# Patient Record
Sex: Female | Born: 1950 | Race: White | Hispanic: No | Smoking: Never smoker
Health system: Southern US, Community
[De-identification: ages and names within clinical notes are randomized; demographics above are authoritative.]

## PROBLEM LIST (undated history)

## (undated) DIAGNOSIS — E785 Hyperlipidemia, unspecified: Secondary | ICD-10-CM

## (undated) DIAGNOSIS — E669 Obesity, unspecified: Secondary | ICD-10-CM

## (undated) DIAGNOSIS — K8020 Calculus of gallbladder without cholecystitis without obstruction: Secondary | ICD-10-CM

## (undated) DIAGNOSIS — E119 Type 2 diabetes mellitus without complications: Secondary | ICD-10-CM

## (undated) DIAGNOSIS — H409 Unspecified glaucoma: Secondary | ICD-10-CM

## (undated) DIAGNOSIS — M1990 Unspecified osteoarthritis, unspecified site: Secondary | ICD-10-CM

## (undated) DIAGNOSIS — H269 Unspecified cataract: Secondary | ICD-10-CM

## (undated) DIAGNOSIS — R519 Headache, unspecified: Secondary | ICD-10-CM

## (undated) DIAGNOSIS — K219 Gastro-esophageal reflux disease without esophagitis: Secondary | ICD-10-CM

## (undated) DIAGNOSIS — F329 Major depressive disorder, single episode, unspecified: Secondary | ICD-10-CM

## (undated) DIAGNOSIS — E039 Hypothyroidism, unspecified: Secondary | ICD-10-CM

## (undated) DIAGNOSIS — C50919 Malignant neoplasm of unspecified site of unspecified female breast: Secondary | ICD-10-CM

## (undated) DIAGNOSIS — F32A Depression, unspecified: Secondary | ICD-10-CM

## (undated) DIAGNOSIS — E079 Disorder of thyroid, unspecified: Secondary | ICD-10-CM

## (undated) HISTORY — PX: TONSILLECTOMY: SUR1361

## (undated) HISTORY — DX: Gastro-esophageal reflux disease without esophagitis: K21.9

## (undated) HISTORY — PX: EYE SURGERY: SHX253

## (undated) HISTORY — DX: Type 2 diabetes mellitus without complications: E11.9

## (undated) HISTORY — DX: Depression, unspecified: F32.A

## (undated) HISTORY — DX: Unspecified glaucoma: H40.9

## (undated) HISTORY — DX: Major depressive disorder, single episode, unspecified: F32.9

## (undated) HISTORY — DX: Disorder of thyroid, unspecified: E07.9

## (undated) HISTORY — DX: Unspecified cataract: H26.9

---

## 1981-12-28 HISTORY — PX: WISDOM TOOTH EXTRACTION: SHX21

## 1996-12-28 HISTORY — PX: CARDIAC CATHETERIZATION: SHX172

## 2004-10-30 ENCOUNTER — Ambulatory Visit: Payer: Self-pay | Admitting: General Surgery

## 2004-10-30 HISTORY — PX: COLONOSCOPY: SHX174

## 2008-12-28 HISTORY — PX: BREAST BIOPSY: SHX20

## 2009-03-15 ENCOUNTER — Ambulatory Visit: Payer: Self-pay | Admitting: General Surgery

## 2009-03-15 IMAGING — MG MAM DGTL SCREENING MAMMO W/CAD
1 series · 4 of 4 positions shown · non-contrast
Comparison: none

REASON FOR EXAM: SCREENING
COMMENTS:  Submitted by practice: [HOSPITAL] Scheduled by
user:

PROCEDURE:     MAM - MAM DGTL SCREENING MAMMO W/CAD  - [DATE]  [DATE]
RESULT:
COMPARISONS: [DATE] and [DATE].

[R CC · right · 4 of 4 slices shown]
[im 1/4]
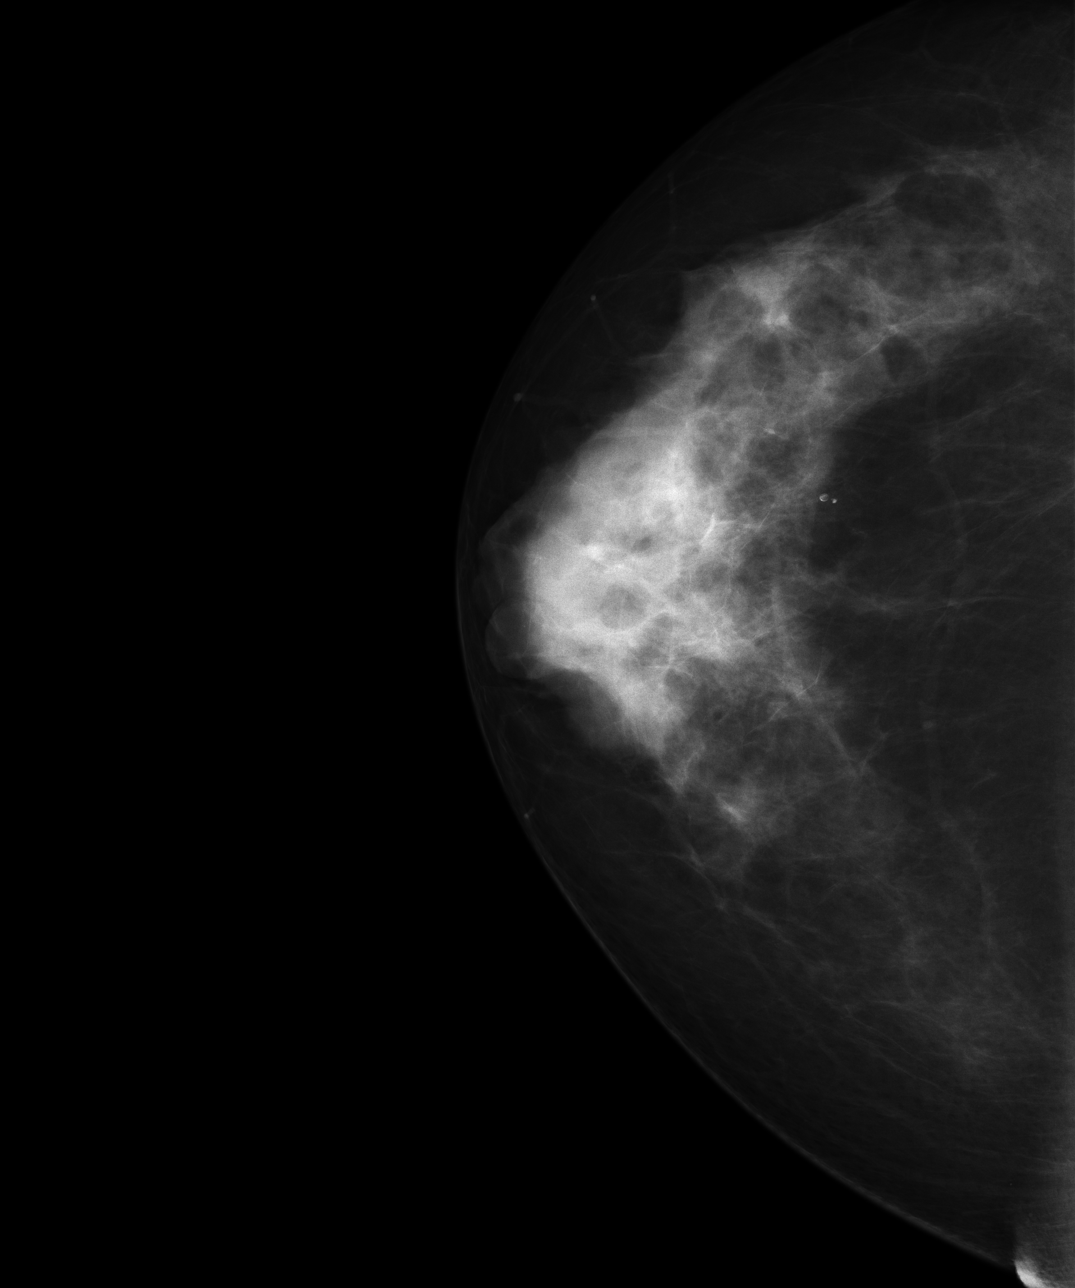
[im 2/4]
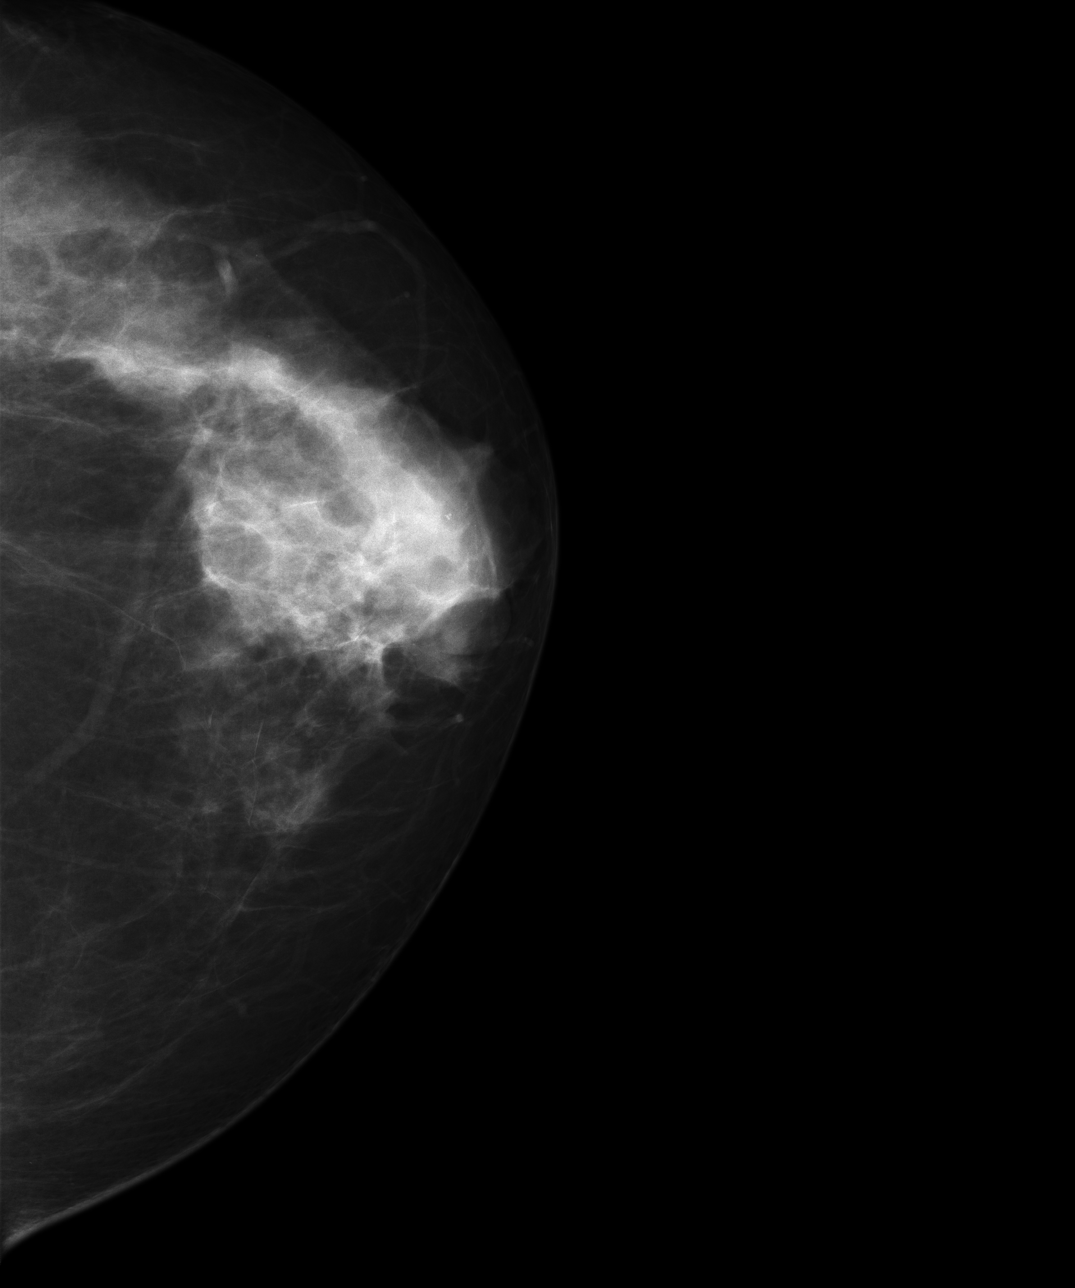
[im 3/4]
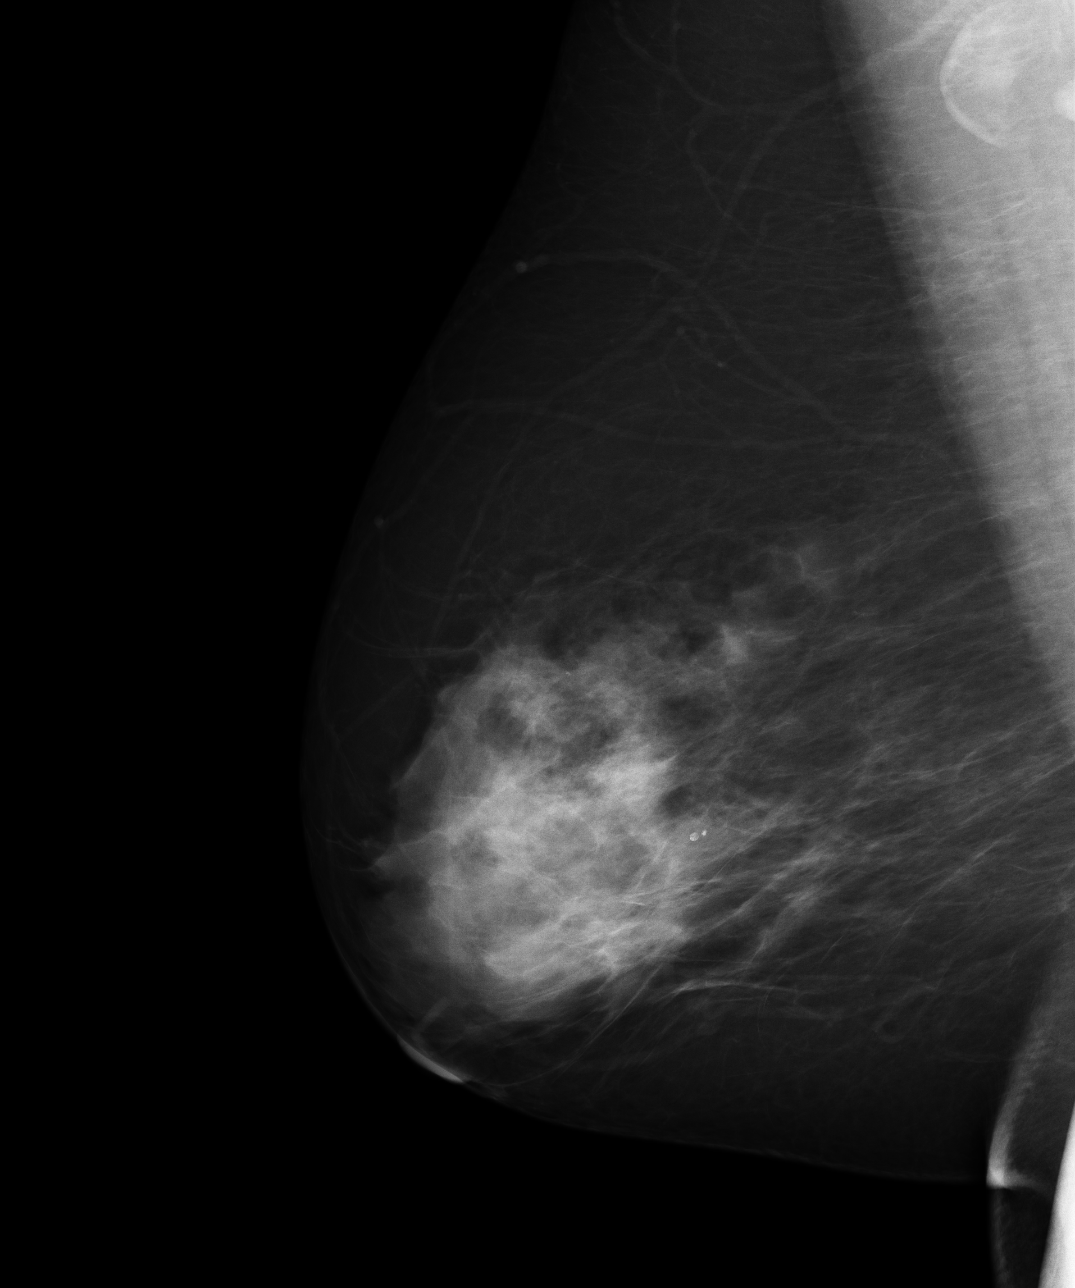
[im 4/4]
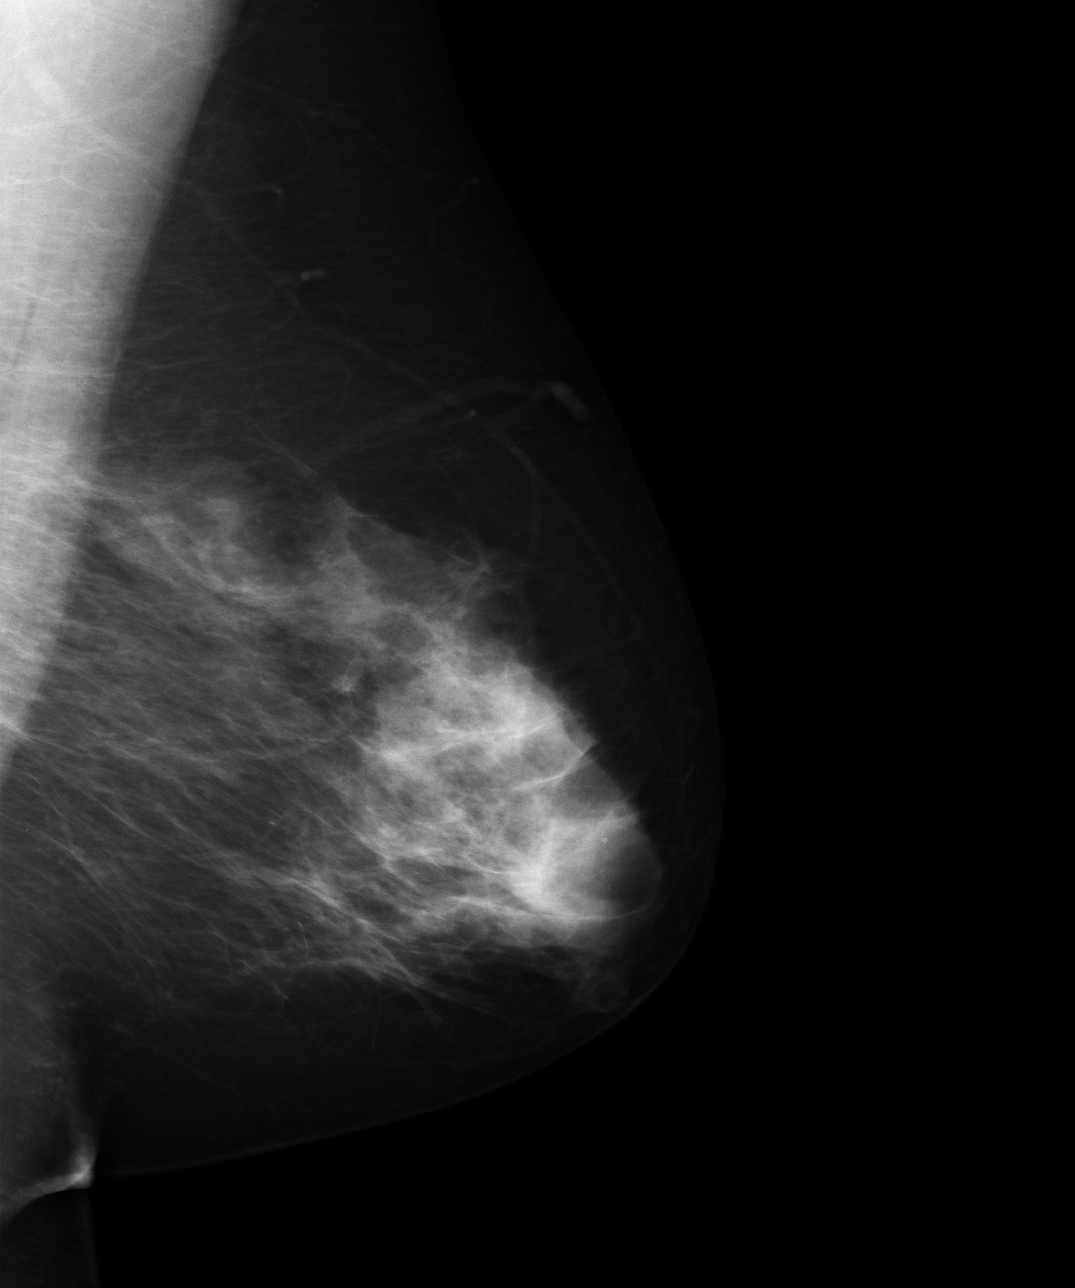

[4 of 4 positions shown; findings below may reference images not displayed]

FINDINGS: Bilateral breasts demonstrate a heterogenous breast density which
may lower the sensitivity of mammography. There is no dominant mass,
architectural distortion, or clusters of suspicious appearing
microcalcifications.
IMPRESSION: 1. Stable, bilateral mammogram.

2. Annual mammographic follow-up is recommended.

BI-RADS: Category 2-Benign Finding

A NEGATIVE MAMMOGRAM REPORT DOES NOT PRECLUDE BIOPSY OR OTHER EVALUATION OF
A CLINICALLY PALPABLE OR OTHERWISE SUSPICIOUS MASS OR LESION. BREAST CANCER
MAY NOT BE DETECTED BY MAMMOGRAPHY IN UP TO 10% OF CASES.

## 2012-05-31 ENCOUNTER — Ambulatory Visit (INDEPENDENT_AMBULATORY_CARE_PROVIDER_SITE_OTHER): Payer: BC Managed Care – PPO | Admitting: Internal Medicine

## 2012-05-31 VITALS — BP 124/83 | HR 66 | Temp 98.8°F | Resp 18 | Ht 61.0 in | Wt 211.0 lb

## 2012-05-31 DIAGNOSIS — R5381 Other malaise: Secondary | ICD-10-CM

## 2012-05-31 DIAGNOSIS — Z6839 Body mass index (BMI) 39.0-39.9, adult: Secondary | ICD-10-CM

## 2012-05-31 DIAGNOSIS — R0989 Other specified symptoms and signs involving the circulatory and respiratory systems: Secondary | ICD-10-CM

## 2012-05-31 DIAGNOSIS — E039 Hypothyroidism, unspecified: Secondary | ICD-10-CM | POA: Insufficient documentation

## 2012-05-31 DIAGNOSIS — R7309 Other abnormal glucose: Secondary | ICD-10-CM

## 2012-05-31 DIAGNOSIS — R739 Hyperglycemia, unspecified: Secondary | ICD-10-CM

## 2012-05-31 DIAGNOSIS — R0609 Other forms of dyspnea: Secondary | ICD-10-CM

## 2012-05-31 DIAGNOSIS — E669 Obesity, unspecified: Secondary | ICD-10-CM | POA: Insufficient documentation

## 2012-05-31 DIAGNOSIS — R635 Abnormal weight gain: Secondary | ICD-10-CM

## 2012-05-31 DIAGNOSIS — R5383 Other fatigue: Secondary | ICD-10-CM

## 2012-05-31 LAB — POCT CBC
Granulocyte percent: 58.3 %G (ref 37–80)
Hemoglobin: 14.7 g/dL (ref 12.2–16.2)
MCH, POC: 29.5 pg (ref 27–31.2)
MCV: 94.6 fL (ref 80–97)
MID (cbc): 0.6 (ref 0–0.9)
MPV: 10.5 fL (ref 0–99.8)
POC MID %: 7 %M (ref 0–12)
Platelet Count, POC: 308 10*3/uL (ref 142–424)
RBC: 4.99 M/uL (ref 4.04–5.48)
WBC: 8.2 10*3/uL (ref 4.6–10.2)

## 2012-05-31 LAB — T4, FREE: Free T4: 0.91 ng/dL (ref 0.80–1.80)

## 2012-05-31 LAB — COMPREHENSIVE METABOLIC PANEL
ALT: 10 U/L (ref 0–35)
Albumin: 4.4 g/dL (ref 3.5–5.2)
Alkaline Phosphatase: 62 U/L (ref 39–117)
Potassium: 4.6 mEq/L (ref 3.5–5.3)
Sodium: 140 mEq/L (ref 135–145)
Total Bilirubin: 0.4 mg/dL (ref 0.3–1.2)
Total Protein: 7 g/dL (ref 6.0–8.3)

## 2012-05-31 LAB — GLUCOSE, POCT (MANUAL RESULT ENTRY): POC Glucose: 62 mg/dl — AB (ref 70–99)

## 2012-05-31 LAB — LIPID PANEL
LDL Cholesterol: 139 mg/dL — ABNORMAL HIGH (ref 0–99)
VLDL: 20 mg/dL (ref 0–40)

## 2012-05-31 LAB — TSH: TSH: 4.085 u[IU]/mL (ref 0.350–4.500)

## 2012-05-31 NOTE — Progress Notes (Signed)
  Subjective:    Patient ID: Valerie Gordon, female    DOB: 06-29-1951, 61 y.o.   MRN: [id number]  HPI Last office visit April 2010 Has been off medications for about a year and has suffered weight gain with Blood sugar erratic, diet erratic, routine all mixed up Feels very depleted after exercise Has DOE Occasionally but no chest pain or diaphoresis/no peripheral edema Off all meds for many months/She is hypothyroid At one point she had full-fledged diabetes mellitus requiring oral medication, but she exercise to the point that she lost enough weight to be off of medication.   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  HENT: Negative for hearing loss.   Eyes: Negative for visual disturbance.  Respiratory: Negative for wheezing.   Cardiovascular: Negative for chest pain, palpitations and leg swelling.  Genitourinary:       Menopause age 48  Neurological: Negative for dizziness, weakness and headaches.  Psychiatric/Behavioral: Negative for behavioral problems and dysphoric mood.       Feels she is under a lot of stress financially owning a half she can afford and also with a recent move back to this area/although she is stressed often she does not have any trouble with sleep       Objective:   Physical Exam Vital signs stable except elevated BMI HEENT clear without thyromegaly Chest clear to auscultation Heart regular without murmurs rubs or gallops Extremities free of peripheral edema Neurological intact       Results for orders placed in visit on 05/31/12  POCT CBC      Component Value Range   WBC 8.2  4.6 - 10.2 (K/uL)   Lymph, poc 2.8  0.6 - 3.4    POC LYMPH PERCENT 34.7  10 - 50 (%L)   MID (cbc) 0.6  0 - 0.9    POC MID % 7.0  0 - 12 (%M)   POC Granulocyte 4.8  2 - 6.9    Granulocyte percent 58.3  37 - 80 (%G)   RBC 4.99  4.04 - 5.48 (M/uL)   Hemoglobin  14.7  12.2 - 16.2 (g/dL)   HCT, POC 40.9  81.1 - 47.9 (%)   MCV 94.6  80 - 97 (fL)   MCH, POC 29.5  27 - 31.2 (pg)   MCHC 31.1 (*) 31.8 - 35.4 (g/dL)   RDW, POC 91.4     Platelet Count, POC 308  142 - 424 (K/uL)   MPV 10.5  0 - 99.8 (fL)  POCT GLYCOSYLATED HEMOGLOBIN (HGB A1C)      Component Value Range   Hemoglobin A1C 5.5    GLUCOSE, POCT (MANUAL RESULT ENTRY)      Component Value Range   POC Glucose 62 (*) 70 - 99 (mg/dl)    Assessment & Plan:  Problem #1 hypothyroidism off of medication Will recheck labs and send prescription to pharmacy Problem #2 abnormal weight gain? Change in routine Problem #3 fatigue with easy fatigability Problem #4 mild dyspnea on exertion Problem #5 history of hyperglycemia  Labs today rule out recurrence of diabetes No evidence of anemia Will check metabolic profile and thyroid studies as well as lipids and Fax to her [phone] Handout given for anti-inflammatory diet with connection to website for their weight loss program at the [facility] [place] division of family medicine

## 2012-06-03 ENCOUNTER — Encounter: Payer: Self-pay | Admitting: Internal Medicine

## 2012-06-09 ENCOUNTER — Telehealth: Payer: Self-pay

## 2012-06-09 NOTE — Telephone Encounter (Signed)
PT SAW DOOLITTLE LAST WEEK AND HE WAS TO FAX HER THE LAB RESULTS AND DECIDE ON HER MEDICATIONS.  SHE HAS NOT HEARD ANYTHING OR SEEN A FAX.  PLEASE CALL.  FAX NUMBER IS [phone]

## 2012-06-10 NOTE — Telephone Encounter (Signed)
I sent her results  LETTER BY MISTAKE> You could prevent that letter and faxed it to her with my condolences-

## 2012-06-10 NOTE — Telephone Encounter (Signed)
Left message of Dr. Netta Corrigan message and faxed labs.

## 2012-06-16 ENCOUNTER — Telehealth: Payer: Self-pay

## 2012-06-16 NOTE — Telephone Encounter (Signed)
PATIENT CALLED TWO WEEKS AGO FOR DOOLITTLE.  HE TOLD HER TO KEEP TAKING HER SYNTHROID AND THAT IT WOULD BE CALLED INTO THE [facility] IN [place] ON [address]  THIS HAS NEVER HAPPENED.  PLEASE CALL

## 2012-06-17 MED ORDER — LEVOTHYROXINE SODIUM 100 MCG PO TABS: 100.0000 ug | ORAL_TABLET | Freq: Every day | ORAL | Status: DC

## 2012-06-17 NOTE — Telephone Encounter (Signed)
Called patient, taking 100mg  q day.

## 2012-06-17 NOTE — Telephone Encounter (Signed)
Done

## 2012-10-26 ENCOUNTER — Ambulatory Visit: Payer: Self-pay | Admitting: Ophthalmology

## 2012-11-30 ENCOUNTER — Ambulatory Visit: Payer: Self-pay | Admitting: Ophthalmology

## 2014-08-20 ENCOUNTER — Ambulatory Visit (INDEPENDENT_AMBULATORY_CARE_PROVIDER_SITE_OTHER): Payer: BC Managed Care – PPO | Admitting: Internal Medicine

## 2014-08-20 VITALS — BP 148/84 | HR 83 | Temp 98.8°F | Resp 16 | Wt 223.8 lb

## 2014-08-20 DIAGNOSIS — R5381 Other malaise: Secondary | ICD-10-CM

## 2014-08-20 DIAGNOSIS — R5383 Other fatigue: Secondary | ICD-10-CM

## 2014-08-20 DIAGNOSIS — R635 Abnormal weight gain: Secondary | ICD-10-CM

## 2014-08-20 DIAGNOSIS — E039 Hypothyroidism, unspecified: Secondary | ICD-10-CM

## 2014-08-20 DIAGNOSIS — B351 Tinea unguium: Secondary | ICD-10-CM

## 2014-08-20 LAB — POCT CBC
GRANULOCYTE PERCENT: 64.6 % (ref 37–80)
HCT, POC: 46.6 % (ref 37.7–47.9)
Hemoglobin: 15.1 g/dL (ref 12.2–16.2)
Lymph, poc: 3.2 (ref 0.6–3.4)
MCH, POC: 30.3 pg (ref 27–31.2)
MCHC: 32.3 g/dL (ref 31.8–35.4)
MCV: 93.7 fL (ref 80–97)
MID (CBC): 0.6 (ref 0–0.9)
MPV: 8.9 fL (ref 0–99.8)
PLATELET COUNT, POC: 275 10*3/uL (ref 142–424)
POC GRANULOCYTE: 6.9 (ref 2–6.9)
POC LYMPH %: 29.5 % (ref 10–50)
POC MID %: 5.9 %M (ref 0–12)
RBC: 4.97 M/uL (ref 4.04–5.48)
RDW, POC: 14 %
WBC: 10.7 10*3/uL — AB (ref 4.6–10.2)

## 2014-08-20 LAB — POCT GLYCOSYLATED HEMOGLOBIN (HGB A1C): Hemoglobin A1C: 5.8

## 2014-08-20 MED ORDER — TERBINAFINE HCL 250 MG PO TABS: 250.0000 mg | ORAL_TABLET | Freq: Every day | ORAL | Status: DC

## 2014-08-20 MED ORDER — BUPROPION HCL ER (XL) 150 MG PO TB24: 150.0000 mg | ORAL_TABLET | Freq: Every day | ORAL | Status: DC

## 2014-08-20 NOTE — Progress Notes (Signed)
Subjective:    Patient ID: Valerie Gordon, female    DOB: Nov 17, 1951, 63 y.o.   MRN: [id number]  This chart was scribed for Tami Lin, MD by Erling Conte, Medical Scribe. This patient was seen in Room 11 and the patient's care was started at 6:30 PM.  Chief Complaint  Patient presents with   Thyroid Problem    Off synthroid for a long time   Weight Gain   Depression    going back on Welbutrin    HPI HPI Comments: Valerie Gordon is a 63 y.o. female with a h/o hypothyroidism and obesity who presents to the Emergency Department complaining of, constant, gradually worsening, weight gain for 7-8 years. Went off Wellbutrin about 7-8 years ago and she thinks that when she started to gradually gain weight again. She also states she is going through some increased depression. States it has not affected her sleep. She is wanting to lose weight. She has cut down her portions and cut back on her carbohydrates and states she has been exercising every day and she is not losing any weight. She would like to go back on Wellbutrin or some other possible medication to help with her depression symptoms. She denies any shortness of breath, chest pain, or swelling in her legs,   She has a second complaint of discoloration in her toenails on her bilateral feet. She denies any rash or pain to the area.  Patient has a long past history [facility]--first husband died with brain cancer under our care. She then developed diabetes which she successfully put into remission with marked weight loss and diet and exercise. She has been stable these last 3 or 4 years It is important that she discontinued her thyroid medicine about one year ago  Patient Active Problem List   Diagnosis Date Noted   BMI 39.0-39.9,adult 05/31/2012   Hypothyroidism 05/31/2012   Past Medical History  Diagnosis Date   Thyroid disease    Past Surgical History  Procedure Laterality Date   Eye surgery Bilateral     cataract  sx   Allergies  Allergen Reactions   Antihistamines, Chlorpheniramine-Type    Ciprocin-Fluocin-Procin [Fluocinolone]    Prior to Admission medications   Medication Sig Start Date End Date Taking? Authorizing Provider  levothyroxine (SYNTHROID, LEVOTHROID) 100 MCG tablet Take 1 tablet (100 mcg total) by mouth daily. 06/17/12 06/17/13  Collene Leyden, PA-C   History   Social History   Marital Status: Single    Spouse Name: N/A    Number of Children: N/A   Years of Education: N/A   Occupational History   Not on file.   Social History Main Topics   Smoking status: Never Smoker    Smokeless tobacco: Not on file   Alcohol Use: Not on file   Drug Use: Not on file   Sexual Activity: Not on file   Other Topics Concern   Not on file   Social History Narrative   No narrative on file    Review of Systems  Constitutional: Positive for unexpected weight change (weight gain).  Respiratory: Negative for shortness of breath.   Cardiovascular: Negative for chest pain and leg swelling.  Musculoskeletal: Negative for arthralgias.  Skin: Positive for color change (in toenails on bilateral feet). Negative for rash.  Psychiatric/Behavioral: Positive for dysphoric mood. Negative for sleep disturbance.  All other systems reviewed and are negative.      Objective:   Physical Exam  Nursing note and  vitals reviewed. Constitutional: She is oriented to person, place, and time. She appears well-developed and well-nourished. No distress.  HENT:  Head: Normocephalic and atraumatic.  Eyes: Conjunctivae and EOM are normal. Pupils are equal, round, and reactive to light.  Neck: Normal range of motion. Neck supple. No tracheal deviation present. No mass and no thyromegaly present.  No goiter or thyroid nodules  Cardiovascular: Normal rate, regular rhythm and normal heart sounds.   No murmur heard. No peripheral edema. Full peripheral pulses  Pulmonary/Chest: Effort normal and breath  sounds normal. No respiratory distress.  Abdominal: Soft. Bowel sounds are normal. She exhibits no mass. There is no tenderness.  Musculoskeletal: Normal range of motion.  Neurological: She is alert and oriented to person, place, and time.  Skin: Skin is warm and dry.  Both great toes are thickened with subungual densities. Several smaller toenails have leuconychia w/o thickening.  Psychiatric: She has a normal mood and affect. Her behavior is normal.    Filed Vitals:   08/20/14 1815  BP: 148/84  Pulse: 83  Temp: 98.8 F (37.1 C)  TempSrc: Oral  Resp: 16  Weight: 223 lb 12.8 oz (101.515 kg)  SpO2: 95%          Assessment & Plan:

## 2014-08-21 ENCOUNTER — Encounter: Payer: Self-pay | Admitting: Internal Medicine

## 2014-08-21 LAB — COMPREHENSIVE METABOLIC PANEL
ALT: 9 U/L (ref 0–35)
AST: 24 U/L (ref 0–37)
Albumin: 4.5 g/dL (ref 3.5–5.2)
Alkaline Phosphatase: 75 U/L (ref 39–117)
BILIRUBIN TOTAL: 0.3 mg/dL (ref 0.2–1.2)
BUN: 14 mg/dL (ref 6–23)
CALCIUM: 9.9 mg/dL (ref 8.4–10.5)
CO2: 27 mEq/L (ref 19–32)
CREATININE: 1.05 mg/dL (ref 0.50–1.10)
Chloride: 104 mEq/L (ref 96–112)
Glucose, Bld: 83 mg/dL (ref 70–99)
Potassium: 4.1 mEq/L (ref 3.5–5.3)
Sodium: 138 mEq/L (ref 135–145)
Total Protein: 7.2 g/dL (ref 6.0–8.3)

## 2014-08-21 LAB — TSH: TSH: 4.517 u[IU]/mL — ABNORMAL HIGH (ref 0.350–4.500)

## 2014-08-21 LAB — T4, FREE: Free T4: 1 ng/dL (ref 0.80–1.80)

## 2014-09-14 ENCOUNTER — Ambulatory Visit (INDEPENDENT_AMBULATORY_CARE_PROVIDER_SITE_OTHER): Payer: BC Managed Care – PPO | Admitting: Internal Medicine

## 2014-09-14 VITALS — BP 120/80 | HR 85 | Temp 98.5°F | Resp 16 | Ht 61.0 in | Wt 223.0 lb

## 2014-09-14 DIAGNOSIS — R05 Cough: Secondary | ICD-10-CM

## 2014-09-14 DIAGNOSIS — J019 Acute sinusitis, unspecified: Secondary | ICD-10-CM

## 2014-09-14 DIAGNOSIS — R059 Cough, unspecified: Secondary | ICD-10-CM

## 2014-09-14 MED ORDER — AMOXICILLIN 875 MG PO TABS: 875.0000 mg | ORAL_TABLET | Freq: Two times a day (BID) | ORAL | Status: DC

## 2014-09-14 MED ORDER — LEVOTHYROXINE SODIUM 100 MCG PO TABS: 100.0000 ug | ORAL_TABLET | Freq: Every day | ORAL | Status: DC

## 2014-09-14 MED ORDER — BUPROPION HCL ER (XL) 150 MG PO TB24: 150.0000 mg | ORAL_TABLET | Freq: Every day | ORAL | Status: DC

## 2014-09-14 MED ORDER — HYDROCODONE-HOMATROPINE 5-1.5 MG/5ML PO SYRP: 5.0000 mL | ORAL_SOLUTION | Freq: Four times a day (QID) | ORAL | Status: DC | PRN

## 2014-09-14 NOTE — Progress Notes (Signed)
Subjective:    Patient ID: Valerie Gordon, female    DOB: 02/14/1951, 63 y.o.   MRN: [id number] This chart was scribed for [place]. Laney Pastor, MD by Steva Colder, ED Scribe. The patient was seen in room 13 at 4:01 PM.   Chief Complaint  Patient presents with  . Sore Throat    x 2 days  . Nasal Congestion    x 2 days  . Cough    x 2 days    HPI Valerie Gordon is a 63 y.o. female with a medical hx of hypothyroidism who presents today complaining of sore throat, nasal congestion, and cough onset 2 days. She states that she went to the beach and came back sick. She states that she shared a condo with 8 other females and none of them were sick. She states that it started in her nose and she does not want it to get into her chest. She states that she is having a productive cough. She denies any other associated symptoms.    Patient Active Problem List   Diagnosis Date Noted  . BMI 40.0-44.9, adult 05/31/2012  . Hypothyroidism 05/31/2012   Past Medical History  Diagnosis Date  . Thyroid disease    Past Surgical History  Procedure Laterality Date  . Eye surgery Bilateral     cataract sx   Allergies  Allergen Reactions  . Antihistamines, Chlorpheniramine-Type   . Ciprocin-Fluocin-Procin [Fluocinolone]    Prior to Admission medications   Medication Sig Start Date End Date Taking? Authorizing Provider  buPROPion (WELLBUTRIN XL) 150 MG 24 hr tablet Take 1 tablet (150 mg total) by mouth daily. After 1 week may increase to 2 tabs a day 08/20/14   Leandrew Koyanagi, MD  levothyroxine (SYNTHROID, LEVOTHROID) 100 MCG tablet Take 1 tablet (100 mcg total) by mouth daily. 06/17/12 06/17/13  Heather Elnora Morrison, PA-C  terbinafine (LAMISIL) 250 MG tablet Take 1 tablet (250 mg total) by mouth daily. 08/20/14   Leandrew Koyanagi, MD       Review of Systems  Constitutional: Negative for fever and chills.  Cardiovascular: Negative for chest pain.  Gastrointestinal: Negative for vomiting.    Musculoskeletal: Negative for neck pain.  Neurological: Negative for light-headedness.       Objective:   Physical Exam  Nursing note and vitals reviewed. Constitutional: She is oriented to person, place, and time. She appears well-developed and well-nourished. No distress.  HENT:  Head: Normocephalic and atraumatic.  Right Ear: External ear normal.  Left Ear: External ear normal.  Mouth/Throat: Oropharynx is clear and moist.  Purulent discharge in nares.  Eyes: EOM are normal.  Neck: Neck supple.  Cardiovascular: Normal rate, regular rhythm and normal heart sounds.   Pulmonary/Chest: Effort normal and breath sounds normal. No respiratory distress.  Musculoskeletal: Normal range of motion.  Lymphadenopathy:    She has no cervical adenopathy.  Neurological: She is alert and oriented to person, place, and time.  Skin: Skin is warm and dry.  Psychiatric: She has a normal mood and affect. Her behavior is normal.         BP 120/80  Pulse 85  Temp(Src) 98.5 F (36.9 C) (Oral)  Resp 16  Ht 5\' 1"  (1.549 m)  Wt 223 lb (101.152 kg)  BMI 42.16 kg/m2  SpO2 94%  Assessment & Plan:  I personally performed the services described in this documentation, which was scribed in my presence. The recorded information has been reviewed and is accurate.  Acute sinusitis, unspecified  Cough  Meds ordered this encounter  Medications  . buPROPion (WELLBUTRIN XL) 150 MG 24 hr tablet    Sig: Take 1 tablet (150 mg total) by mouth daily. After 1 week may increase to 2 tabs a day    Dispense:  180 tablet    Refill:  3    May refill with 300mg  XL # 90 1 Daily if preferred  . levothyroxine (SYNTHROID, LEVOTHROID) 100 MCG tablet    Sig: Take 1 tablet (100 mcg total) by mouth daily.    Dispense:  90 tablet    Refill:  3  . HYDROcodone-homatropine (HYCODAN) 5-1.5 MG/5ML syrup    Sig: Take 5 mLs by mouth every 6 (six) hours as needed.    Dispense:  120 mL    Refill:  0  . amoxicillin  (AMOXIL) 875 MG tablet    Sig: Take 1 tablet (875 mg total) by mouth 2 (two) times daily.    Dispense:  20 tablet    Refill:  0

## 2014-12-24 ENCOUNTER — Telehealth: Payer: Self-pay

## 2014-12-24 DIAGNOSIS — Z0000 Encounter for general adult medical examination without abnormal findings: Secondary | ICD-10-CM

## 2014-12-24 DIAGNOSIS — R532 Functional quadriplegia: Secondary | ICD-10-CM

## 2014-12-24 NOTE — Telephone Encounter (Signed)
Pt is scheduled for a CPE with dr Laney Pastor on 01/16/15, but states in the past the dr has written an order to get her bloodwork done in advance. Pt is requesting to make the same arrangements for her labs, so results can be discussed at the time of the appointment

## 2014-12-25 NOTE — Telephone Encounter (Signed)
Lab orders have been placed. Pt advised to come in for lab only.

## 2014-12-25 NOTE — Telephone Encounter (Signed)
Please advise which labs to place orders for for future.  Will call to advise pt.

## 2014-12-25 NOTE — Telephone Encounter (Signed)
Cbc lipids cmet A1C

## 2015-01-09 ENCOUNTER — Other Ambulatory Visit (INDEPENDENT_AMBULATORY_CARE_PROVIDER_SITE_OTHER): Payer: 59 | Admitting: Radiology

## 2015-01-09 DIAGNOSIS — Z0000 Encounter for general adult medical examination without abnormal findings: Secondary | ICD-10-CM

## 2015-01-09 LAB — LIPID PANEL
CHOL/HDL RATIO: 3.8 ratio
Cholesterol: 201 mg/dL — ABNORMAL HIGH (ref 0–200)
HDL: 53 mg/dL (ref >39)
LDL CALC: 118 mg/dL — AB (ref 0–99)
Triglycerides: 151 mg/dL — ABNORMAL HIGH (ref <150)
VLDL: 30 mg/dL (ref 0–40)

## 2015-01-09 LAB — COMPLETE METABOLIC PANEL WITH GFR
ALBUMIN: 3.9 g/dL (ref 3.5–5.2)
ALK PHOS: 74 U/L (ref 39–117)
ALT: 9 U/L (ref 0–35)
AST: 21 U/L (ref 0–37)
BILIRUBIN TOTAL: 0.4 mg/dL (ref 0.2–1.2)
BUN: 13 mg/dL (ref 6–23)
CO2: 26 mEq/L (ref 19–32)
CREATININE: 0.99 mg/dL (ref 0.50–1.10)
Calcium: 9.8 mg/dL (ref 8.4–10.5)
Chloride: 104 mEq/L (ref 96–112)
GFR, EST AFRICAN AMERICAN: 71 mL/min
GFR, Est Non African American: 62 mL/min
GLUCOSE: 178 mg/dL — AB (ref 70–99)
Potassium: 4.8 mEq/L (ref 3.5–5.3)
SODIUM: 136 meq/L (ref 135–145)
TOTAL PROTEIN: 6.8 g/dL (ref 6.0–8.3)

## 2015-01-09 LAB — CBC
HCT: 44 % (ref 36.0–46.0)
Hemoglobin: 14.8 g/dL (ref 12.0–15.0)
MCH: 31 pg (ref 26.0–34.0)
MCHC: 33.6 g/dL (ref 30.0–36.0)
MCV: 92.1 fL (ref 78.0–100.0)
MPV: 10.9 fL (ref 8.6–12.4)
PLATELETS: 266 10*3/uL (ref 150–400)
RBC: 4.78 MIL/uL (ref 3.87–5.11)
RDW: 13.1 % (ref 11.5–15.5)
WBC: 7.7 10*3/uL (ref 4.0–10.5)

## 2015-01-09 LAB — POCT GLYCOSYLATED HEMOGLOBIN (HGB A1C): Hemoglobin A1C: 6.9

## 2015-01-09 NOTE — Progress Notes (Signed)
PT HERE FOR LABS ONLY

## 2015-01-16 ENCOUNTER — Ambulatory Visit (INDEPENDENT_AMBULATORY_CARE_PROVIDER_SITE_OTHER): Payer: 59

## 2015-01-16 ENCOUNTER — Ambulatory Visit (INDEPENDENT_AMBULATORY_CARE_PROVIDER_SITE_OTHER): Payer: 59 | Admitting: Internal Medicine

## 2015-01-16 ENCOUNTER — Encounter: Payer: Self-pay | Admitting: Internal Medicine

## 2015-01-16 VITALS — BP 153/103 | HR 83 | Temp 98.2°F | Resp 16 | Ht 60.5 in | Wt 221.0 lb

## 2015-01-16 DIAGNOSIS — H9190 Unspecified hearing loss, unspecified ear: Secondary | ICD-10-CM

## 2015-01-16 DIAGNOSIS — E039 Hypothyroidism, unspecified: Secondary | ICD-10-CM | POA: Diagnosis not present

## 2015-01-16 DIAGNOSIS — R0609 Other forms of dyspnea: Secondary | ICD-10-CM

## 2015-01-16 DIAGNOSIS — R030 Elevated blood-pressure reading, without diagnosis of hypertension: Secondary | ICD-10-CM | POA: Diagnosis not present

## 2015-01-16 DIAGNOSIS — IMO0001 Reserved for inherently not codable concepts without codable children: Secondary | ICD-10-CM

## 2015-01-16 DIAGNOSIS — Z23 Encounter for immunization: Secondary | ICD-10-CM | POA: Diagnosis not present

## 2015-01-16 DIAGNOSIS — L989 Disorder of the skin and subcutaneous tissue, unspecified: Secondary | ICD-10-CM

## 2015-01-16 DIAGNOSIS — K219 Gastro-esophageal reflux disease without esophagitis: Secondary | ICD-10-CM | POA: Diagnosis not present

## 2015-01-16 DIAGNOSIS — F329 Major depressive disorder, single episode, unspecified: Secondary | ICD-10-CM | POA: Diagnosis not present

## 2015-01-16 DIAGNOSIS — F32A Depression, unspecified: Secondary | ICD-10-CM

## 2015-01-16 DIAGNOSIS — Z8639 Personal history of other endocrine, nutritional and metabolic disease: Secondary | ICD-10-CM | POA: Diagnosis not present

## 2015-01-16 DIAGNOSIS — Z0000 Encounter for general adult medical examination without abnormal findings: Secondary | ICD-10-CM

## 2015-01-16 DIAGNOSIS — G4710 Hypersomnia, unspecified: Secondary | ICD-10-CM

## 2015-01-16 DIAGNOSIS — R0683 Snoring: Secondary | ICD-10-CM | POA: Diagnosis not present

## 2015-01-16 DIAGNOSIS — R5383 Other fatigue: Secondary | ICD-10-CM | POA: Diagnosis not present

## 2015-01-16 DIAGNOSIS — E785 Hyperlipidemia, unspecified: Secondary | ICD-10-CM

## 2015-01-16 LAB — POCT URINALYSIS DIPSTICK
BILIRUBIN UA: NEGATIVE
Glucose, UA: NEGATIVE
Ketones, UA: NEGATIVE
Leukocytes, UA: NEGATIVE
NITRITE UA: NEGATIVE
Protein, UA: NEGATIVE
Spec Grav, UA: <=1.005
Urobilinogen, UA: 0.2
pH, UA: 5.5

## 2015-01-16 IMAGING — CR DG CHEST 2V
2 series · 2 of 2 positions shown · non-contrast
Comparison: None.

CLINICAL DATA: Dyspnea on exertion.  Annual physical.

EXAM:
CHEST  2 VIEW

[lateral]
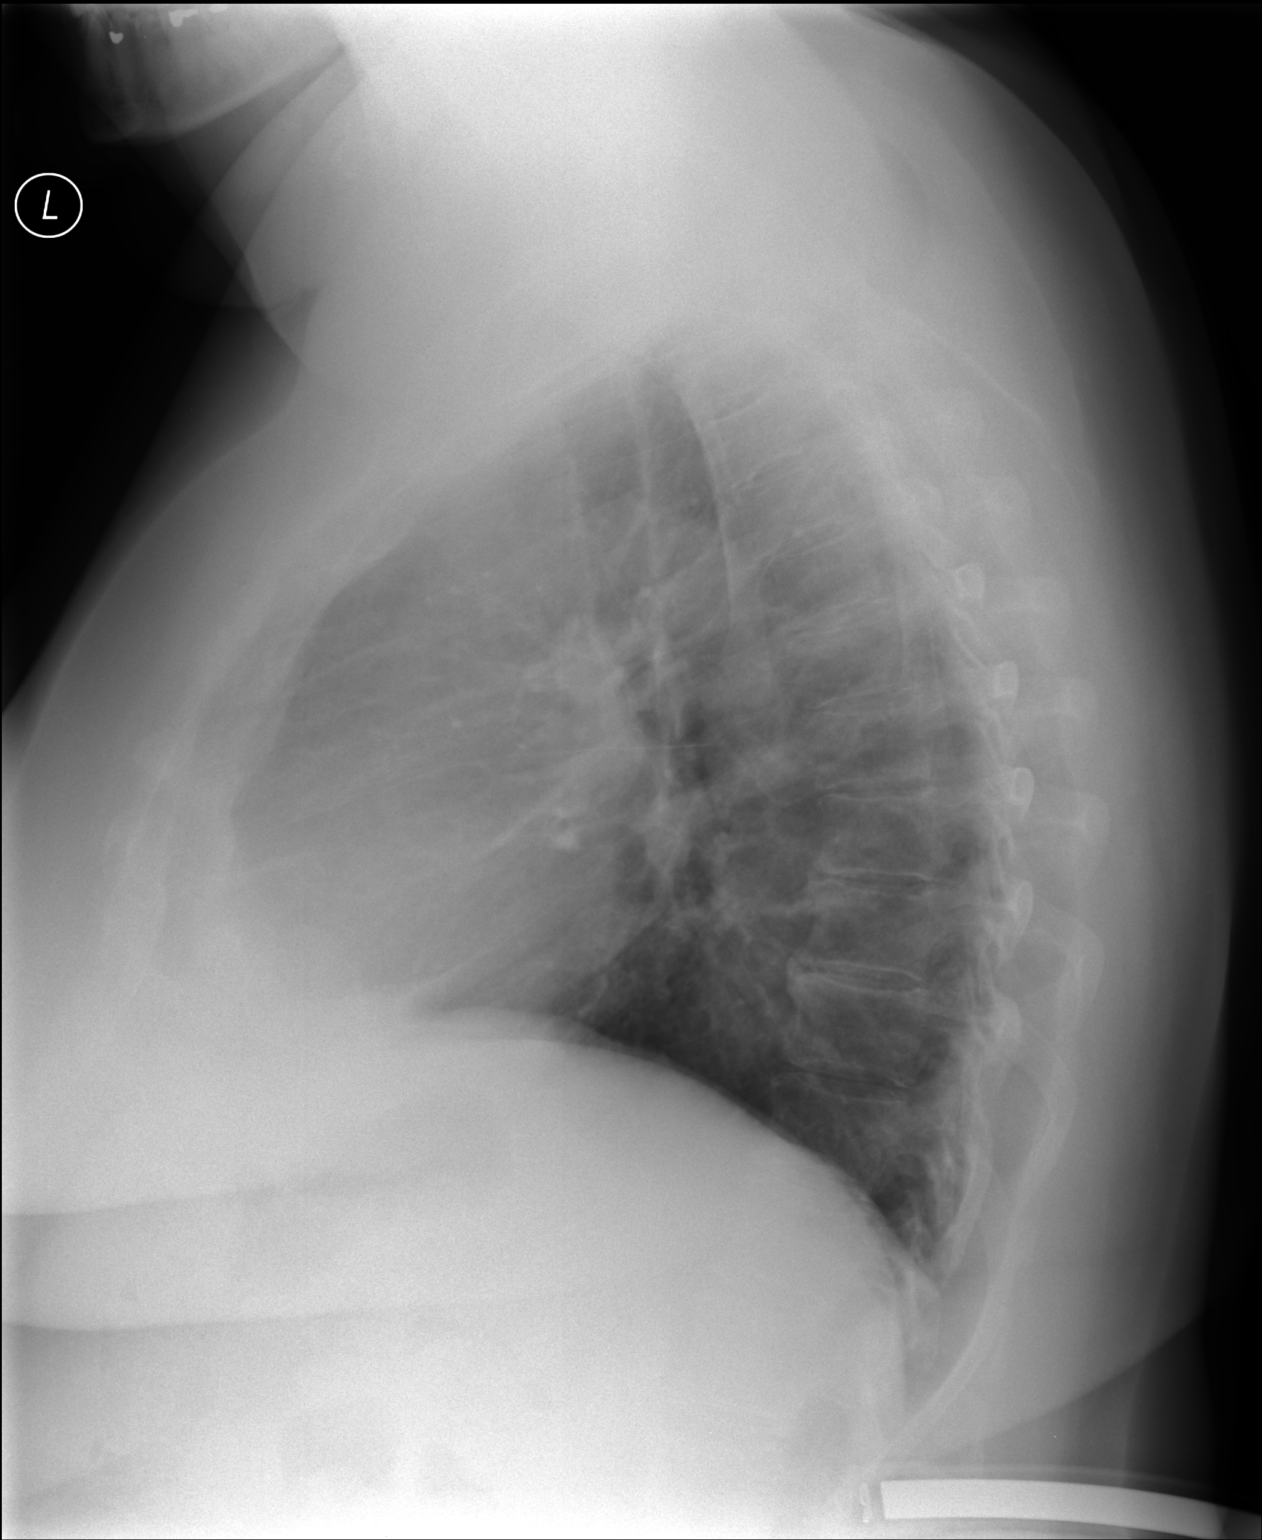

[PA]
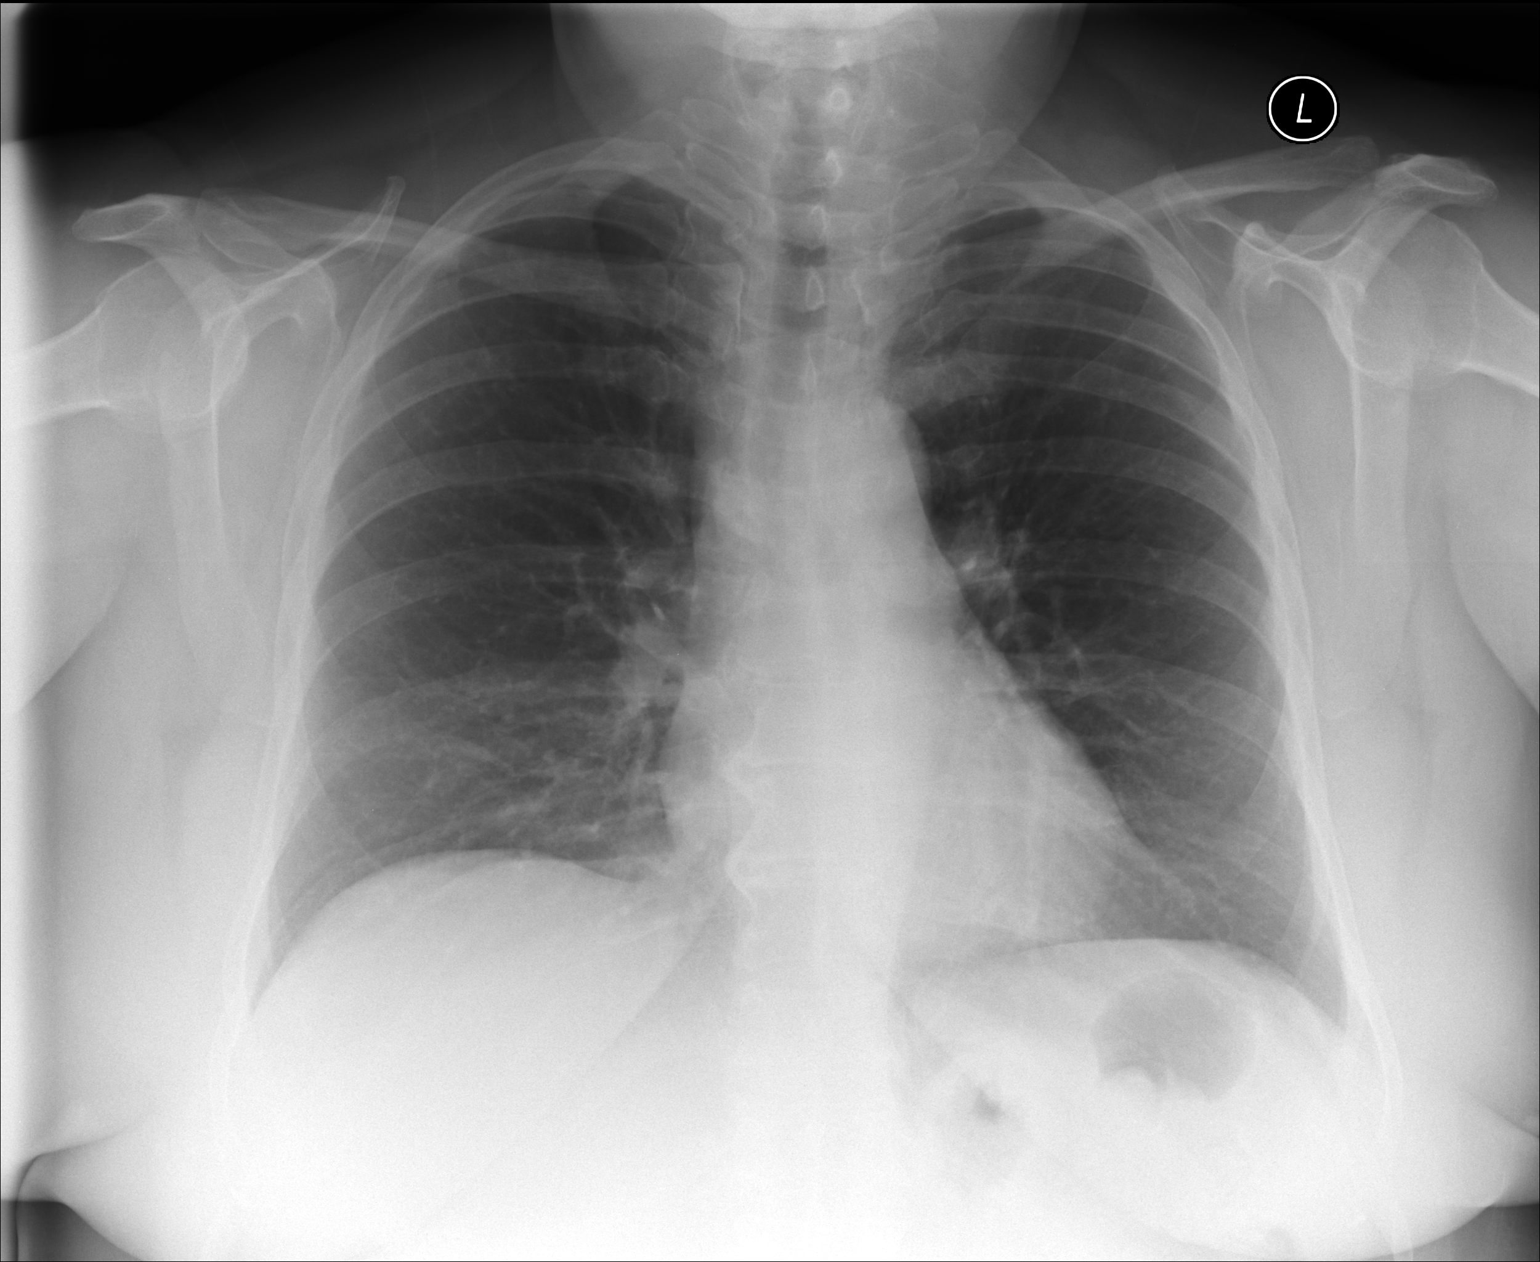

[2 of 2 positions shown; findings below may reference images not displayed]

FINDINGS: Cardiac silhouette normal in size. Normal mediastinal and hilar
contours. Clear lungs. No pleural effusion or pneumothorax. Bony
thorax is intact.
IMPRESSION: No active cardiopulmonary disease.

## 2015-01-16 MED ORDER — FLUOCINONIDE 0.05 % EX CREA: 1.0000 "application " | TOPICAL_CREAM | Freq: Two times a day (BID) | CUTANEOUS | Status: DC

## 2015-01-16 MED ORDER — ESCITALOPRAM OXALATE 10 MG PO TABS: 10.0000 mg | ORAL_TABLET | Freq: Every day | ORAL | Status: DC

## 2015-01-16 MED ORDER — ZOSTER VACCINE LIVE 19400 UNT/0.65ML ~~LOC~~ SOLR: 0.6500 mL | Freq: Once | SUBCUTANEOUS | Status: DC

## 2015-01-16 NOTE — Progress Notes (Signed)
Subjective:    Patient ID: Valerie Gordon, female    DOB: 02/01/1951, 64 y.o.   MRN: [id number] Here for annual checkup and multiple problems Cough Associated symptoms include myalgias, postnasal drip and shortness of breath.  Shortness of Breath Associated symptoms include abdominal pain.  Abdominal Pain Associated symptoms include myalgias.  New lesion arm  Patient Active Problem List   Diagnosis Date Noted  . GERD (gastroesophageal reflux disease)----not active in recent years  01/16/2015  . Elevated BP--- was diagnosed with hypertension at one point 2004/04/02 but lost a lot of weight and her blood pressure elevation resolved 2006///was diagnosed with diabetes Apr 02, 2000 at a weight of 250 pounds //in 2005/04/02 she lost to 175 pounds at her need for diabetes treatment resolved --at one point she needed to be treated at [facility] due to difficulty controlling her sugars ///she has remained off medicines ever since  01/16/2015  . Depression--- her problems surfaced before 04/03/1999 as her first husband died from a brain tumor --- at one point she was treated by Dr. Clovis Pu--- after losing weight she reconnected with her high school boyfriend and that relationship is still going although they have reached somewhat of an impasse because she would like to be married again and he does not see the need. This creates significant stress for her. She currently lacks motivation for controlling her diet and for doing her usual exercise regimen and has begun to gain weight again(over last 2 years). She expresses an hedonism. She is withdrawing from activities. She feels significant fatigue all the time She does have a history of occasional snoring but has no observed apnea. She wakes feeling non-restored. There is some difficulty driving along way with feeling sleepy. She can usually read without falling asleep but will fall asleep as soon as she sits down to watch a movie at home at night.  01/16/2015  . Hyperlipidemia LDL goal  <100--- recently [id number] off meds 01/16/2015  . BMI 40.0-44.9, adult 05/31/2012  . Hypothyroidism---tsh sl incr w/ nl t4 8/15 05/31/2012  Meds include Synthroid 100    Review of Systems  Constitutional: Positive for fatigue.  HENT: Positive for hearing loss, postnasal drip, sinus pressure and tinnitus.   Eyes: Positive for visual disturbance.  Respiratory: Positive for cough and shortness of breath.   Gastrointestinal: Positive for abdominal pain.  Musculoskeletal: Positive for myalgias.  Psychiatric/Behavioral: Positive for dysphoric mood and agitation.   her shortness of breath is with regular activity over the last 3-4 months, and does not include diaphoresis chest pain or palpitations at the same time. She is unable to do what she used to do in terms of walking or working out. Her cough is more recent than the shortness of breath. Like a tickle cough, nonproductive, without clear provocation. She does have allergic rhinitis with frequent postnasal drip and a history of GERD which has been inactive lately. She has noticed an association with restarting Wellbutrin and having this cough. When she discontinued Wellbutrin last week the cough went away. Her hearing loss is described as being unable to follow conversations in a crowded room. Her abdominal pain is very occasional and often associated with constipation although this is not a frequent problem. Along with her fatigue she notices some generalized muscle tenderness. Skin-over the past year she has had a lesion on the left forearm that has slowly enlarged without symptoms of itching or pain. There has been no scabbing or bleeding.    Objective:   Physical  Exam  Constitutional: She is oriented to person, place, and time. She appears well-developed and well-nourished. No distress.  Obviously overweight  HENT:  Head: Normocephalic.  Right Ear: External ear normal.  Left Ear: External ear normal.  Mouth/Throat: Oropharynx is clear and  moist.  Turbinates boggy with clear rhinorrhea  Eyes: Conjunctivae and EOM are normal. Pupils are equal, round, and reactive to light.  Neck: Normal range of motion. Neck supple. No thyromegaly present.  Cardiovascular: Normal rate, regular rhythm, normal heart sounds and intact distal pulses.   No murmur heard. Pulmonary/Chest: Effort normal and breath sounds normal. She has no wheezes.  Abdominal: Soft. Bowel sounds are normal. She exhibits no distension and no mass. There is no tenderness. There is no rebound.  Musculoskeletal: Normal range of motion. She exhibits no edema or tenderness.  Lymphadenopathy:    She has no cervical adenopathy.  Neurological: She is alert and oriented to person, place, and time. She has normal reflexes. No cranial nerve deficit.  Skin: Skin is warm and dry.  There is a 0.8 cm flesh-colored papule that is scaly with a defined border on the left forearm  Psychiatric: Her behavior is normal. Judgment and thought content normal.  She has somewhat sad demeanor  Nursing note and vitals reviewed.  BP 153/103 mmHg  Pulse 83  Temp(Src) 98.2 F (36.8 C)  Resp 16  Ht 5' 0.5" (1.537 m)  Wt 221 lb (100.245 kg)  BMI 42.43 kg/m2  SpO2 98% Wt Readings from Last 3 Encounters:  01/16/15 221 lb (100.245 kg)  09/14/14 223 lb (101.152 kg)  08/20/14 223 lb 12.8 oz (101.515 kg)  o6/04/13 211 lb  [facility] reading (PRIMARY) by  Dr. Laney Pastor chest x-ray is clear although there is mild appearance of increased markings in the right lower lung fields on the PA///felt to be normal by the radiologist   Hemoglobin A1c 6.9 EKG within normal limits Audiometry shows no hearing loss    Assessment & Plan:  Annual physical exam - Plan: POCT urinalysis dipstick, Ambulatory referral to Gynecology, Ambulatory referral to Ophthalmology, TSH, T4, free, EKG 12-Lead, DG Chest 2 View,  Need for immunization against influenza - Plan: Flu Vaccine QUAD 36+ mos IM (Fluarix)  Need for  shingles vaccine - Plan: zoster vaccine live, PF, (ZOSTAVAX) 13086 UNT/0.65ML injection  Hypothyroidism, unspecified hypothyroidism type - Plan: TSH, T4, free  Other fatigue  Hearing loss, unspecified laterality--- she take antihistamines for 2 months to see if this clears since her audiometry was normal  DOE (dyspnea on exertion)-? Related to fatigue and deconditioning//she has had a cardiac catheterization in 2000 which was normal/done after a presentation in the emergency room with chest pain///if we cannot find an etiology for this then she will need a Cardiolite  History of diabetes mellitus -we discussed restarting metformin but she would prefer to make a sincere attempt at weight loss once again before restarting medicines  Hypersomnolence--- sleep study needed given her obesity, snoring, fatigue, depression, elevated blood pressure, shallow pharynx  Gastroesophageal reflux disease without esophagitis--no meds for now  Elevated BP--- start home blood pressure readings  Depression--start Lexapro//refer to Dr. Oneida Arenas at [facility] for counseling with regard to her current relationship  Hyperlipidemia LDL goal <100----slightly elevated at 118  Skin lesion of left arm--- Lidex for one month and if not resolved will need a biopsy to rule out basal cell  Meds ordered this encounter  Medications  . zoster vaccine live, PF, (ZOSTAVAX) 57846 UNT/0.65ML injection  Sig: Inject 19,400 Units into the skin once.    Dispense:  1 each    Refill:  0  . fluocinonide cream (LIDEX) 0.05 %    Sig: Apply 1 application topically 2 (two) times daily.    Dispense:  30 g    Refill:  0  . escitalopram (LEXAPRO) 10 MG tablet    Sig: Take 1 tablet (10 mg total) by mouth daily.    Dispense:  30 tablet    Refill:  5   Her new insurance requires [place] to refer her to her original ophthalmologist and gynecologist to continue care  Results for orders placed or performed in  visit on 01/16/15  TSH  Result Value Ref Range   TSH 3.718 0.350 - 4.500 uIU/mL  T4, free  Result Value Ref Range   Free T4 0.96 0.80 - 1.80 ng/dL  POCT urinalysis dipstick  Result Value Ref Range   Color, UA yellow    Clarity, UA clear    Glucose, UA neg    Bilirubin, UA neg    Ketones, UA neg    Spec Grav, UA <=1.005    Blood, UA trace-intact    pH, UA 5.5    Protein, UA neg    Urobilinogen, UA 0.2    Nitrite, UA neg    Leukocytes, UA Negative   note labs from 01/09/15= normal except LDL 118, glucose 178, and hemoglobin A1c 6.9 up from 5.9 on 08/20/2014

## 2015-01-17 LAB — TSH: TSH: 3.718 u[IU]/mL (ref 0.350–4.500)

## 2015-01-17 LAB — T4, FREE: FREE T4: 0.96 ng/dL (ref 0.80–1.80)

## 2015-01-18 ENCOUNTER — Encounter: Payer: Self-pay | Admitting: Internal Medicine

## 2015-01-23 ENCOUNTER — Other Ambulatory Visit: Payer: Self-pay | Admitting: Internal Medicine

## 2015-01-23 ENCOUNTER — Encounter: Payer: Self-pay | Admitting: Internal Medicine

## 2015-01-23 MED ORDER — LEVOTHYROXINE SODIUM 100 MCG PO TABS: 100.0000 ug | ORAL_TABLET | Freq: Every day | ORAL | Status: DC

## 2015-01-29 LAB — HM MAMMOGRAPHY

## 2015-02-25 LAB — HM DIABETES EYE EXAM

## 2015-02-26 ENCOUNTER — Encounter: Payer: Self-pay | Admitting: Internal Medicine

## 2015-02-27 ENCOUNTER — Encounter: Payer: Self-pay | Admitting: Internal Medicine

## 2015-06-07 ENCOUNTER — Telehealth: Payer: Self-pay | Admitting: *Deleted

## 2015-06-07 NOTE — Telephone Encounter (Signed)
Phoned & [facility] for Tonya in med rec for patient's ob/gyn (also added him to care team), requesting 01/2015 mammo results.

## 2015-06-11 NOTE — Telephone Encounter (Signed)
Received mammo results for mammo completed 01/29/2015 - no significant mammographic findings.  Updated health maintenance & abstracted.

## 2015-06-24 ENCOUNTER — Encounter: Payer: Self-pay | Admitting: *Deleted

## 2015-11-25 ENCOUNTER — Encounter: Payer: Self-pay | Admitting: Internal Medicine

## 2016-02-07 ENCOUNTER — Encounter: Payer: Self-pay | Admitting: *Deleted

## 2016-02-13 ENCOUNTER — Encounter: Payer: Self-pay | Admitting: General Surgery

## 2016-02-17 ENCOUNTER — Encounter: Payer: Self-pay | Admitting: General Surgery

## 2016-02-17 ENCOUNTER — Ambulatory Visit (INDEPENDENT_AMBULATORY_CARE_PROVIDER_SITE_OTHER): Payer: BLUE CROSS/BLUE SHIELD | Admitting: General Surgery

## 2016-02-17 VITALS — BP 130/74 | HR 76 | Resp 12 | Ht 64.0 in | Wt 214.0 lb

## 2016-02-17 DIAGNOSIS — Z1211 Encounter for screening for malignant neoplasm of colon: Secondary | ICD-10-CM | POA: Diagnosis not present

## 2016-02-17 MED ORDER — POLYETHYLENE GLYCOL 3350 17 GM/SCOOP PO POWD: ORAL | Status: DC

## 2016-02-17 NOTE — Patient Instructions (Addendum)
Colonoscopy A colonoscopy is an exam to look at the entire large intestine (colon). This exam can help find problems such as tumors, polyps, inflammation, and areas of bleeding. The exam takes about 1 hour.  LET [facility] CARE PROVIDER KNOW ABOUT:   Any allergies you have.  All medicines you are taking, including vitamins, herbs, eye drops, creams, and over-the-counter medicines.  Previous problems you or members of your family have had with the use of anesthetics.  Any blood disorders you have.  Previous surgeries you have had.  Medical conditions you have. RISKS AND COMPLICATIONS  Generally, this is a safe procedure. However, as with any procedure, complications can occur. Possible complications include:  Bleeding.  Tearing or rupture of the colon wall.  Reaction to medicines given during the exam.  Infection (rare). BEFORE THE PROCEDURE   Ask your health care provider about changing or stopping your regular medicines.  You may be prescribed an oral bowel prep. This involves drinking a large amount of medicated liquid, starting the day before your procedure. The liquid will cause you to have multiple loose stools until your stool is almost clear or light green. This cleans out your colon in preparation for the procedure.  Do not eat or drink anything else once you have started the bowel prep, unless your health care provider tells you it is safe to do so.  Arrange for someone to drive you home after the procedure. PROCEDURE   You will be given medicine to help you relax (sedative).  You will lie on your side with your knees bent.  A long, flexible tube with a light and camera on the end (colonoscope) will be inserted through the rectum and into the colon. The camera sends video back to a computer screen as it moves through the colon. The colonoscope also releases carbon dioxide gas to inflate the colon. This helps your health care provider see the area better.  During  the exam, your health care provider may take a small tissue sample (biopsy) to be examined under a microscope if any abnormalities are found.  The exam is finished when the entire colon has been viewed. AFTER THE PROCEDURE   Do not drive for 24 hours after the exam.  You may have a small amount of blood in your stool.  You may pass moderate amounts of gas and have mild abdominal cramping or bloating. This is caused by the gas used to inflate your colon during the exam.  Ask when your test results will be ready and how you will get your results. Make sure you get your test results.   This information is not intended to replace advice given to you by your health care provider. Make sure you discuss any questions you have with your health care provider.   Document Released: 12/11/2000 Document Revised: 10/04/2013 Document Reviewed: 08/21/2013 Elsevier Interactive Patient Education [employer].  Patient has been scheduled for a colonoscopy on 04-01-16 at [facility].

## 2016-02-17 NOTE — Progress Notes (Signed)
Patient ID: Valerie Gordon, female   DOB: 1951/04/20, 65 y.o.   MRN: [id number]  Chief Complaint  Patient presents with  . OTHER    colonoscopy    HPI Valerie Gordon is a 65 y.o. female Her today for evauation for a screening colonoscopy. Patient denied any GI problems at this time. Last colonoscopy 10-30-04.  The patient reports she has had no further regular reflux symptoms since her 2005 exam.  She continues to work as an Scientist, clinical (histocompatibility and immunogenetics).  I personally reviewed the patient's history. HPI  Past Medical History  Diagnosis Date  . Thyroid disease   . Cataract   . Depression   . GERD (gastroesophageal reflux disease)   . Glaucoma     Past Surgical History  Procedure Laterality Date  . Eye surgery Bilateral     cataract sx  . Tonsillectomy    . Cardiac catheterization    . Colonoscopy  10-30-2004    Family History  Problem Relation Age of Onset  . Stroke Mother   . Heart disease Father     Social History Social History  Substance Use Topics  . Smoking status: Never Smoker   . Smokeless tobacco: None  . Alcohol Use: Yes    Allergies  Allergen Reactions  . Antihistamines, Chlorpheniramine-Type   . Ciprocin-Fluocin-Procin [Fluocinolone]     Current Outpatient Prescriptions  Medication Sig Dispense Refill  . latanoprost (XALATAN) 0.005 % ophthalmic solution 1 drop at bedtime.    Marland Kitchen levothyroxine (SYNTHROID, LEVOTHROID) 100 MCG tablet Take 1 tablet (100 mcg total) by mouth daily. 90 tablet 3  . polyethylene glycol powder (GLYCOLAX/MIRALAX) powder 255 grams one bottle for colonoscopy prep 255 g 0  . zoster vaccine live, PF, (ZOSTAVAX) 29562 UNT/0.65ML injection Inject 19,400 Units into the skin once. (Patient not taking: Reported on 02/17/2016) 1 each 0   No current facility-administered medications for this visit.    Review of Systems Review of Systems  Constitutional: Negative.   Respiratory: Negative.   Cardiovascular: Negative.    Gastrointestinal: Negative.     Blood pressure 130/74, pulse 76, resp. rate 12, height 5\' 4"  (1.626 m), weight 214 lb (97.07 kg).  Physical Exam Physical Exam  Constitutional: She is oriented to person, place, and time. She appears well-developed and well-nourished.  Cardiovascular: Normal rate, regular rhythm and normal heart sounds.   Pulmonary/Chest: Effort normal and breath sounds normal.  Neurological: She is alert and oriented to person, place, and time.  Skin: Skin is warm and dry.    Data Reviewed Upper endoscopy dated 10/30/2004 showed some erythematous mucosa in the prepyloric area. Colonoscopy of the same date was entirely normal.  Assessment    Candidate for screening colonoscopy.    Plan         Tami Lin P    Colonoscopy with possible biopsy/polypectomy prn: Information regarding the procedure, including its potential risks and complications (including but not limited to perforation of the bowel, which may require emergency surgery to repair, and bleeding) was verbally given to the patient. Educational information regarding lower intestinal endoscopy was given to the patient. Written instructions for how to complete the bowel prep using Miralax were provided. The importance of drinking ample fluids to avoid dehydration as a result of the prep emphasized.  Patient has been scheduled for a colonoscopy on 04-01-16 at [facility].    Tami Lin P This information has been scribed by [email or web address]  Robert Bellow 02/17/2016, 2:46 PM

## 2016-03-16 ENCOUNTER — Encounter: Payer: Self-pay | Admitting: Primary Care

## 2016-03-16 ENCOUNTER — Ambulatory Visit (INDEPENDENT_AMBULATORY_CARE_PROVIDER_SITE_OTHER): Payer: BLUE CROSS/BLUE SHIELD | Admitting: Primary Care

## 2016-03-16 VITALS — BP 136/74 | HR 76 | Temp 98.4°F | Ht 60.75 in | Wt 209.0 lb

## 2016-03-16 DIAGNOSIS — K219 Gastro-esophageal reflux disease without esophagitis: Secondary | ICD-10-CM | POA: Diagnosis not present

## 2016-03-16 DIAGNOSIS — E039 Hypothyroidism, unspecified: Secondary | ICD-10-CM

## 2016-03-16 DIAGNOSIS — IMO0001 Reserved for inherently not codable concepts without codable children: Secondary | ICD-10-CM

## 2016-03-16 DIAGNOSIS — R030 Elevated blood-pressure reading, without diagnosis of hypertension: Secondary | ICD-10-CM

## 2016-03-16 DIAGNOSIS — E669 Obesity, unspecified: Secondary | ICD-10-CM

## 2016-03-16 DIAGNOSIS — F32A Depression, unspecified: Secondary | ICD-10-CM

## 2016-03-16 DIAGNOSIS — E119 Type 2 diabetes mellitus without complications: Secondary | ICD-10-CM

## 2016-03-16 DIAGNOSIS — F329 Major depressive disorder, single episode, unspecified: Secondary | ICD-10-CM

## 2016-03-16 DIAGNOSIS — E785 Hyperlipidemia, unspecified: Secondary | ICD-10-CM

## 2016-03-16 MED ORDER — LEVOTHYROXINE SODIUM 100 MCG PO TABS: 100.0000 ug | ORAL_TABLET | Freq: Every day | ORAL | Status: DC

## 2016-03-16 NOTE — Assessment & Plan Note (Signed)
Not currently on PPI or other treatment.

## 2016-03-16 NOTE — Assessment & Plan Note (Signed)
Back on Bupropion XL 150 as of 1 week ago. Will continue to monitor. Denies SI/HI

## 2016-03-16 NOTE — Assessment & Plan Note (Signed)
TSH stable from 1 year ago. Due again and will obtain at upcoming physical. Refills provided of levothyroxine 100 mcg.

## 2016-03-16 NOTE — Assessment & Plan Note (Signed)
BP stable today. Will continue to monitor.

## 2016-03-16 NOTE — Progress Notes (Signed)
Pre visit review using our clinic review tool, if applicable. No additional management support is needed unless otherwise documented below in the visit note. 

## 2016-03-16 NOTE — Assessment & Plan Note (Signed)
Diagnosed years ago. Manages with "diet". Last A1C years ago, will recheck soon. Currently on no meds, no statin, no ACE.

## 2016-03-16 NOTE — Assessment & Plan Note (Signed)
Plans to start exercising and improve her diet now that she's back on her antidepressant. Will obtain updated labs during upcoming physical.

## 2016-03-16 NOTE — Progress Notes (Signed)
Subjective:    Patient ID: Valerie Gordon, female    DOB: [birth year], 64 y.o.   MRN: [id number]  HPI  Valerie Gordon is a 65 year old female who presents today to establish care and discuss the problems mentioned below. Will obtain old records. Her last physical was in February 2016.  1) Depression: Diagnosed numerous years. Currently managed on Buproprion XL 150 mg. She had been managed on this medication for years but weaned herself off years ago as she felt better. She was recently placed back on this medication 1 week ago as over the past year she's felt fatigued, noticed that she had been isolating herself, and hadn't felt like doing anything. Denies SI/HI.  2) Hypothyroidism: Diagnosed years ago. Currently managed on Levothyroxine 100 mcg. Her last TSH was stable in January 2016. Denies cold intolerance, throat swelling, dizziness. She's not required a dosage change in years. Needing refills.  3) Hyperlipidemia: Diagnosed years ago. She's not currently managed on medication. Last lipid panel was January 2016 which was slightly above goal. She would like to work on her diet and start exercising again now that she's being treated for her depression.  4) Type 2 Diabetes: Diagnosed 25 years ago. She is not managed on medication now as she manages with healthy diet and exercise. Occasional numbness to fingers. Denies polyuria and polydipsia. She is due for recheck of A1C.  Review of Systems  Respiratory: Negative for shortness of breath.   Cardiovascular: Negative for chest pain.  Endocrine: Negative for cold intolerance and polyuria.  Neurological: Positive for numbness.  Psychiatric/Behavioral: Negative for suicidal ideas. The patient is not nervous/anxious.        Past Medical History  Diagnosis Date  . Thyroid disease   . Cataract   . Depression   . GERD (gastroesophageal reflux disease)   . Glaucoma   . Type 2 diabetes mellitus ([place])     manages with diet and exercise     Social History   Social History  . Marital Status: Widowed    Spouse Name: N/A  . Number of Children: N/A  . Years of Education: N/A   Occupational History  . Not on file.   Social History Main Topics  . Smoking status: Never Smoker   . Smokeless tobacco: Not on file  . Alcohol Use: Yes  . Drug Use: No  . Sexual Activity: Yes   Other Topics Concern  . Not on file   Social History Narrative   Widower.   Step children.   Environmental health practitioner.   Enjoys riding hot air balloons, spending time with friends, reading, jewelry    Past Surgical History  Procedure Laterality Date  . Eye surgery Bilateral     cataract sx  . Tonsillectomy    . Cardiac catheterization  1998  . Colonoscopy  10-30-2004  . Breast biopsy  2010    Family History  Problem Relation Age of Onset  . Stroke Mother   . Heart disease Father     Allergies  Allergen Reactions  . Antihistamines, Chlorpheniramine-Type   . Ciprocin-Fluocin-Procin [Fluocinolone]     Current Outpatient Prescriptions on File Prior to Visit  Medication Sig Dispense Refill  . latanoprost (XALATAN) 0.005 % ophthalmic solution 1 drop at bedtime.     No current facility-administered medications on file prior to visit.    BP 136/74 mmHg  Pulse 76  Temp(Src) 98.4 F (36.9 C) (Oral)  Ht 5' 0.75" (1.543 m)  Wt  209 lb (94.802 kg)  BMI 39.82 kg/m2  SpO2 98%    Objective:   Physical Exam  Constitutional: She appears well-nourished.  Neck: Neck supple. No thyromegaly present.  Cardiovascular: Normal rate and regular rhythm.   Pulmonary/Chest: Effort normal and breath sounds normal.  Skin: Skin is warm and dry.  Psychiatric: She has a normal mood and affect.          Assessment & Plan:

## 2016-03-16 NOTE — Assessment & Plan Note (Signed)
Last lipid panel 1 year ago borderline. Will repeat soon.

## 2016-03-16 NOTE — Patient Instructions (Signed)
I sent a refill of your levothyroxine to your pharmacy.   Please schedule a physical with me at your convenience. You may also schedule a lab only appointment 3-4 days prior. We will discuss your lab results in detail during your physical.  It was a pleasure to meet you today! Please don't hesitate to call me with any questions. Welcome to [facility]!

## 2016-03-21 ENCOUNTER — Other Ambulatory Visit: Payer: Self-pay | Admitting: Primary Care

## 2016-03-21 DIAGNOSIS — E119 Type 2 diabetes mellitus without complications: Secondary | ICD-10-CM

## 2016-03-21 DIAGNOSIS — E785 Hyperlipidemia, unspecified: Secondary | ICD-10-CM

## 2016-03-21 DIAGNOSIS — E039 Hypothyroidism, unspecified: Secondary | ICD-10-CM

## 2016-03-21 DIAGNOSIS — E2839 Other primary ovarian failure: Secondary | ICD-10-CM

## 2016-03-25 ENCOUNTER — Telehealth: Payer: Self-pay | Admitting: *Deleted

## 2016-03-25 NOTE — Telephone Encounter (Signed)
Patient was contacted today and confirms no medications changes since last office visit.   She reports she has yet to pick up Miralax prescription but was instructed to do so.   We will proceed with colonoscopy as scheduled for 04-01-16 at [facility].   This patient was instructed to call the office should she have further questions.

## 2016-03-27 ENCOUNTER — Other Ambulatory Visit (INDEPENDENT_AMBULATORY_CARE_PROVIDER_SITE_OTHER): Payer: BLUE CROSS/BLUE SHIELD

## 2016-03-27 DIAGNOSIS — E119 Type 2 diabetes mellitus without complications: Secondary | ICD-10-CM | POA: Diagnosis not present

## 2016-03-27 DIAGNOSIS — E039 Hypothyroidism, unspecified: Secondary | ICD-10-CM

## 2016-03-27 DIAGNOSIS — E2839 Other primary ovarian failure: Secondary | ICD-10-CM

## 2016-03-27 DIAGNOSIS — E785 Hyperlipidemia, unspecified: Secondary | ICD-10-CM

## 2016-03-27 LAB — COMPREHENSIVE METABOLIC PANEL
ALBUMIN: 4.3 g/dL (ref 3.6–5.1)
ALT: 25 U/L (ref 6–29)
AST: 48 U/L — ABNORMAL HIGH (ref 10–35)
Alkaline Phosphatase: 79 U/L (ref 33–130)
BUN: 11 mg/dL (ref 7–25)
CHLORIDE: 100 mmol/L (ref 98–110)
CO2: 26 mmol/L (ref 20–31)
Calcium: 9.6 mg/dL (ref 8.6–10.4)
Creat: 0.86 mg/dL (ref 0.50–0.99)
Glucose, Bld: 197 mg/dL — ABNORMAL HIGH (ref 65–99)
POTASSIUM: 4.3 mmol/L (ref 3.5–5.3)
Sodium: 136 mmol/L (ref 135–146)
TOTAL PROTEIN: 7 g/dL (ref 6.1–8.1)
Total Bilirubin: 0.4 mg/dL (ref 0.2–1.2)

## 2016-03-27 LAB — CBC WITH DIFFERENTIAL/PLATELET
Basophils Absolute: 0.1 10*3/uL (ref 0.0–0.1)
Basophils Relative: 1 % (ref 0–1)
EOS ABS: 0.2 10*3/uL (ref 0.0–0.7)
Eosinophils Relative: 3 % (ref 0–5)
HCT: 45.7 % (ref 36.0–46.0)
HEMOGLOBIN: 15.2 g/dL — AB (ref 12.0–15.0)
LYMPHS ABS: 2.6 10*3/uL (ref 0.7–4.0)
Lymphocytes Relative: 42 % (ref 12–46)
MCH: 30.4 pg (ref 26.0–34.0)
MCHC: 33.3 g/dL (ref 30.0–36.0)
MCV: 91.4 fL (ref 78.0–100.0)
MONO ABS: 0.5 10*3/uL (ref 0.1–1.0)
MONOS PCT: 8 % (ref 3–12)
MPV: 10.4 fL (ref 8.6–12.4)
NEUTROS ABS: 2.9 10*3/uL (ref 1.7–7.7)
NEUTROS PCT: 46 % (ref 43–77)
Platelets: 263 10*3/uL (ref 150–400)
RBC: 5 MIL/uL (ref 3.87–5.11)
RDW: 12.7 % (ref 11.5–15.5)
WBC: 6.2 10*3/uL (ref 4.0–10.5)

## 2016-03-27 LAB — TSH: TSH: 3.93 mIU/L

## 2016-03-27 LAB — LIPID PANEL
CHOL/HDL RATIO: 4.9 ratio (ref <=5.0)
CHOLESTEROL: 192 mg/dL (ref 125–200)
HDL: 39 mg/dL — ABNORMAL LOW (ref >=46)
LDL Cholesterol: 120 mg/dL (ref <130)
Triglycerides: 167 mg/dL — ABNORMAL HIGH (ref <150)
VLDL: 33 mg/dL — AB (ref <30)

## 2016-03-27 LAB — HEMOGLOBIN A1C
Hgb A1c MFr Bld: 11.6 % — ABNORMAL HIGH (ref <5.7)
MEAN PLASMA GLUCOSE: 286 mg/dL

## 2016-03-28 LAB — VITAMIN D 25 HYDROXY (VIT D DEFICIENCY, FRACTURES): Vit D, 25-Hydroxy: 23 ng/mL — ABNORMAL LOW (ref 30–100)

## 2016-03-30 ENCOUNTER — Encounter: Payer: Self-pay | Admitting: Primary Care

## 2016-03-30 ENCOUNTER — Ambulatory Visit (INDEPENDENT_AMBULATORY_CARE_PROVIDER_SITE_OTHER): Payer: BLUE CROSS/BLUE SHIELD | Admitting: Primary Care

## 2016-03-30 VITALS — BP 126/80 | HR 78 | Temp 98.1°F | Ht 61.0 in | Wt 209.8 lb

## 2016-03-30 DIAGNOSIS — E559 Vitamin D deficiency, unspecified: Secondary | ICD-10-CM

## 2016-03-30 DIAGNOSIS — Z23 Encounter for immunization: Secondary | ICD-10-CM

## 2016-03-30 DIAGNOSIS — R030 Elevated blood-pressure reading, without diagnosis of hypertension: Secondary | ICD-10-CM

## 2016-03-30 DIAGNOSIS — R059 Cough, unspecified: Secondary | ICD-10-CM

## 2016-03-30 DIAGNOSIS — R05 Cough: Secondary | ICD-10-CM | POA: Diagnosis not present

## 2016-03-30 DIAGNOSIS — Z0000 Encounter for general adult medical examination without abnormal findings: Secondary | ICD-10-CM

## 2016-03-30 DIAGNOSIS — F32A Depression, unspecified: Secondary | ICD-10-CM

## 2016-03-30 DIAGNOSIS — E119 Type 2 diabetes mellitus without complications: Secondary | ICD-10-CM

## 2016-03-30 DIAGNOSIS — E039 Hypothyroidism, unspecified: Secondary | ICD-10-CM | POA: Diagnosis not present

## 2016-03-30 DIAGNOSIS — E785 Hyperlipidemia, unspecified: Secondary | ICD-10-CM

## 2016-03-30 DIAGNOSIS — F329 Major depressive disorder, single episode, unspecified: Secondary | ICD-10-CM

## 2016-03-30 DIAGNOSIS — IMO0001 Reserved for inherently not codable concepts without codable children: Secondary | ICD-10-CM

## 2016-03-30 DIAGNOSIS — Z0001 Encounter for general adult medical examination with abnormal findings: Secondary | ICD-10-CM | POA: Insufficient documentation

## 2016-03-30 MED ORDER — ZOSTER VACCINE LIVE 19400 UNT/0.65ML ~~LOC~~ SOLR: 0.6500 mL | Freq: Once | SUBCUTANEOUS | Status: DC

## 2016-03-30 MED ORDER — AZITHROMYCIN 250 MG PO TABS: ORAL_TABLET | ORAL | Status: DC

## 2016-03-30 MED ORDER — BENZONATATE 200 MG PO CAPS: 200.0000 mg | ORAL_CAPSULE | Freq: Three times a day (TID) | ORAL | Status: DC | PRN

## 2016-03-30 MED ORDER — METFORMIN HCL 1000 MG PO TABS: 1000.0000 mg | ORAL_TABLET | Freq: Two times a day (BID) | ORAL | Status: DC

## 2016-03-30 NOTE — Patient Instructions (Addendum)
Start Metformin 1000 mg tablets for diabetes. Take 1 tablet by mouth once daily for 2 weeks, then take 1 tablet by mouth twice daily thereafter.  Start exercising. You should be getting 1 hour of moderate intensity exercise 5 days weekly.  Start a multivitamin.  Start Vitamin D 1000 unit capsules. Take 1 capsule by mouth everyday.  You've been provided with a tetanus and pneumonia vaccination today.   Please call your insurance company in regards to the Shingles vaccination and where they will provide coverage. Do not obtain this vaccination until 30 days after today.  Start Azithromycin antibiotics. Take 2 tablets by mouth today, then 1 tablet daily for 4 additional days.  You may take Benzonatate capsules for cough. Take 1 capsule by mouth three times daily as needed for cough.  Please notify me if no improvement in cough.  Schedule a 30 minute follow up appointment in 3 months for recheck of diabetes, cholesterol, and for cognitive evaluation.   It was a pleasure to see you today!  Type 2 Diabetes Mellitus, Adult Type 2 diabetes mellitus, often simply referred to as type 2 diabetes, is a long-lasting (chronic) disease. In type 2 diabetes, the pancreas does not make enough insulin (a hormone), the cells are less responsive to the insulin that is made (insulin resistance), or both. Normally, insulin moves sugars from food into the tissue cells. The tissue cells use the sugars for energy. The lack of insulin or the lack of normal response to insulin causes excess sugars to build up in the blood instead of going into the tissue cells. As a result, high blood sugar (hyperglycemia) develops. The effect of high sugar (glucose) levels can cause many complications. Type 2 diabetes was also previously called adult-onset diabetes, but it can occur at any age.  RISK FACTORS  A person is predisposed to developing type 2 diabetes if someone in the family has the disease and also has one or more of  the following primary risk factors:  Weight gain, or being overweight or obese.  An inactive lifestyle.  A history of consistently eating high-calorie foods. Maintaining a normal weight and regular physical activity can reduce the chance of developing type 2 diabetes. SYMPTOMS  A person with type 2 diabetes may not show symptoms initially. The symptoms of type 2 diabetes appear slowly. The symptoms include:  Increased thirst (polydipsia).  Increased urination (polyuria).  Increased urination during the night (nocturia).  Sudden or unexplained weight changes.  Frequent, recurring infections.  Tiredness (fatigue).  Weakness.  Vision changes, such as blurred vision.  Fruity smell to your breath.  Abdominal pain.  Nausea or vomiting.  Cuts or bruises which are slow to heal.  Tingling or numbness in the hands or feet.  An open skin wound (ulcer). DIAGNOSIS Type 2 diabetes is frequently not diagnosed until complications of diabetes are present. Type 2 diabetes is diagnosed when symptoms or complications are present and when blood glucose levels are increased. Your blood glucose level may be checked by one or more of the following blood tests:  A fasting blood glucose test. You will not be allowed to eat for at least 8 hours before a blood sample is taken.  A random blood glucose test. Your blood glucose is checked at any time of the day regardless of when you ate.  A hemoglobin A1c blood glucose test. A hemoglobin A1c test provides information about blood glucose control over the previous 3 months.  An oral glucose tolerance test (OGTT).  Your blood glucose is measured after you have not eaten (fasted) for 2 hours and then after you drink a glucose-containing beverage. TREATMENT   You may need to take insulin or diabetes medicine daily to keep blood glucose levels in the desired range.  If you use insulin, you may need to adjust the dosage depending on the carbohydrates  that you eat with each meal or snack.  Lifestyle changes are recommended as part of your treatment. These may include:  Following an individualized diet plan developed by a nutritionist or dietitian.  Exercising daily. Your health care providers will set individualized treatment goals for you based on your age, your medicines, how long you have had diabetes, and any other medical conditions you have. Generally, the goal of treatment is to maintain the following blood glucose levels:  Before meals (preprandial): 80-130 mg/dL.  After meals (postprandial): below 180 mg/dL.  A1c: less than 6.5-7%. HOME CARE INSTRUCTIONS   Have your hemoglobin A1c level checked twice a year.  Perform daily blood glucose monitoring as directed by your health care provider.  Monitor urine ketones when you are ill and as directed by your health care provider.  Take your diabetes medicine or insulin as directed by your health care provider to maintain your blood glucose levels in the desired range.  Never run out of diabetes medicine or insulin. It is needed every day.  If you are using insulin, you may need to adjust the amount of insulin given based on your intake of carbohydrates. Carbohydrates can raise blood glucose levels but need to be included in your diet. Carbohydrates provide vitamins, minerals, and fiber which are an essential part of a healthy diet. Carbohydrates are found in fruits, vegetables, whole grains, dairy products, legumes, and foods containing added sugars.  Eat healthy foods. You should make an appointment to see a registered dietitian to help you create an eating plan that is right for you.  Lose weight if you are overweight.  Carry a medical alert card or wear your medical alert jewelry.  Carry a 15-gram carbohydrate snack with you at all times to treat low blood glucose (hypoglycemia). Some examples of 15-gram carbohydrate snacks include:  Glucose tablets, 3 or 4.  Glucose  gel, 15-gram tube.  Raisins, 2 tablespoons (24 grams).  Jelly beans, 6.  Animal crackers, 8.  Regular pop, 4 ounces (120 mL).  Gummy treats, 9.  Recognize hypoglycemia. Hypoglycemia occurs with blood glucose levels of 70 mg/dL and below. The risk for hypoglycemia increases when fasting or skipping meals, during or after intense exercise, and during sleep. Hypoglycemia symptoms can include:  Tremors or shakes.  Decreased ability to concentrate.  Sweating.  Increased heart rate.  Headache.  Dry mouth.  Hunger.  Irritability.  Anxiety.  Restless sleep.  Altered speech or coordination.  Confusion.  Treat hypoglycemia promptly. If you are alert and able to safely swallow, follow the 15:15 rule:  Take 15-20 grams of rapid-acting glucose or carbohydrate. Rapid-acting options include glucose gel, glucose tablets, or 4 ounces (120 mL) of fruit juice, regular soda, or low-fat milk.  Check your blood glucose level 15 minutes after taking the glucose.  Take 15-20 grams more of glucose if the repeat blood glucose level is still 70 mg/dL or below.  Eat a meal or snack within 1 hour once blood glucose levels return to normal.  Be alert to feeling very thirsty and urinating more frequently than usual, which are early signs of hyperglycemia. An early awareness of  hyperglycemia allows for prompt treatment. Treat hyperglycemia as directed by your health care provider.  Engage in at least 150 minutes of moderate-intensity physical activity a week, spread over at least 3 days of the week or as directed by your health care provider. In addition, you should engage in resistance exercise at least 2 times a week or as directed by your health care provider. Try to spend no more than 90 minutes at one time inactive.  Adjust your medicine and food intake as needed if you start a new exercise or sport.  Follow your sick-day plan anytime you are unable to eat or drink as usual.  Do not use  any tobacco products including cigarettes, chewing tobacco, or electronic cigarettes. If you need help quitting, ask your health care provider.  Limit alcohol intake to no more than 1 drink per day for nonpregnant women and 2 drinks per day for men. You should drink alcohol only when you are also eating food. Talk with your health care provider whether alcohol is safe for you. Tell your health care provider if you drink alcohol several times a week.  Keep all follow-up visits as directed by your health care provider. This is important.  Schedule an eye exam soon after the diagnosis of type 2 diabetes and then annually.  Perform daily skin and foot care. Examine your skin and feet daily for cuts, bruises, redness, nail problems, bleeding, blisters, or sores. A foot exam by a health care provider should be done annually.  Brush your teeth and gums at least twice a day and floss at least once a day. Follow up with your dentist regularly.  Share your diabetes management plan with your workplace or school.  Keep your immunizations up to date. It is recommended that you receive a flu (influenza) vaccine every year. It is also recommended that you receive a pneumonia (pneumococcal) vaccine. If you are 44 years of age or older and have never received a pneumonia vaccine, this vaccine may be given as a series of two separate shots. Ask your health care provider which additional vaccines may be recommended.  Learn to manage stress.  Obtain ongoing diabetes education and support as needed.  Participate in or seek rehabilitation as needed to maintain or improve independence and quality of life. Request a physical or occupational therapy referral if you are having foot or hand numbness, or difficulties with grooming, dressing, eating, or physical activity. SEEK MEDICAL CARE IF:   You are unable to eat food or drink fluids for more than 6 hours.  You have nausea and vomiting for more than 6 hours.  Your  blood glucose level is over 240 mg/dL.  There is a change in mental status.  You develop an additional serious illness.  You have diarrhea for more than 6 hours.  You have been sick or have had a fever for a couple of days and are not getting better.  You have pain during any physical activity.  SEEK IMMEDIATE MEDICAL CARE IF:  You have difficulty breathing.  You have moderate to large ketone levels.   This information is not intended to replace advice given to you by your health care provider. Make sure you discuss any questions you have with your health care provider.   Document Released: 12/14/2005 Document Revised: 09/04/2015 Document Reviewed: 07/12/2012 Elsevier Interactive Patient Education [employer].

## 2016-03-30 NOTE — Assessment & Plan Note (Signed)
Td and pneumonia due and provided today. Mammogram and Pap UTD. Colonoscopy scheduled for this Wednesday. Eye exam UTD. RX printed for Zostavax for her to obtain in 1 month. Discussed the importance of a healthy diet and regular exercise in order for weight loss and to reduce risk of other medical diseases. Exam unremarkable. Labs with uncontrolled diabetes, hyperlipidemia, and vitamin d deficiency.  Follow up in 1 year for repeat physical, sooner for repeat labs and diabetes follow up.

## 2016-03-30 NOTE — Assessment & Plan Note (Signed)
Much improved on Wellbutrin, continue same.

## 2016-03-30 NOTE — Progress Notes (Signed)
Pre visit review using our clinic review tool, if applicable. No additional management support is needed unless otherwise documented below in the visit note. 

## 2016-03-30 NOTE — Assessment & Plan Note (Signed)
TSH stable, continue levothyroxine 100 mcg.

## 2016-03-30 NOTE — Assessment & Plan Note (Signed)
A1C of of 11.6 on recent labs. Long discussion regarding importance of healthy diet and exercise. Start Metformin 1000 mg daily x 2 weeks, increase to BID dosing thereafter. Pneumovax provided today, foot exam next visit. Will have her work to improve her diet, if no improvement in lipids will add statin. Urine microalbumin next visit. Not currently on ACE/ARB. Follow up in 3 months for repeat labs.

## 2016-03-30 NOTE — Assessment & Plan Note (Signed)
Level of 23 on recent labs. Start multivitamin and OTC 1000 unit vitamin d capsules. Will recheck in 3 months.

## 2016-03-30 NOTE — Progress Notes (Signed)
Subjective:    Patient ID: Valerie Gordon, female    DOB: [birth year], 65 y.o.   MRN: [id number]  HPI  Ms. Zepeda is a 65 year old female who presents today for complete physical.  Immunizations: -Tetanus: Completed in 2007 -Influenza: Completed in January 2016 -Pneumonia: Never completed.  -Shingles: History of shingles 15 years ago.   Diet: She endorses a fair diet. She attempts to avoid carbohydrates and processed foods. Breakfast: Hard boiled egg, 1/4 cup of cereal with low fat milk. Lunch: Tuna salad, Chicken salad, vegetables, left overs Dinner: Grilled chicken, pork chops, steak, spinich, sweet potatoes, brocolli Snacks: None Desserts: Ice cream bars 4 times weekly. Beverages: Coffee, water, unsweet tea, occasionally diet soda, wine (4-5 times weekly)  Exercise: She does not currently exercise.  Eye exam: Completed in February 2017. History of glaucoma.  Dental exam: Completed years ago.  Colonoscopy: Completed in 2005, due this Wednesday and has scheduled.  Pap Smear: Completed in 2017, normal.  Mammogram: Completed in 2017, normal.   1) Cough: Present for the past 2 weeks. She also reports fatigue and persistent cough.She did have body aches, fatigue, sore throat last week which has since resolved. She took Alka-Selzer Plus at that time with improvement.  Denies sore throat, sinus pressure, fevers. She's taken Mucinex without improvement. Overall she's feeling improved, but not 100% better.     Review of Systems  HENT: Positive for congestion. Negative for rhinorrhea, sinus pressure and sore throat.   Respiratory: Positive for cough. Negative for shortness of breath.   Cardiovascular: Negative for chest pain.  Gastrointestinal: Negative for diarrhea and constipation.  Genitourinary: Negative for difficulty urinating.  Musculoskeletal: Negative for myalgias and arthralgias.       Chronic hip and lower back pain  Allergic/Immunologic: Positive for environmental  allergies.  Neurological: Negative for dizziness and headaches.       Occasional tingling to hands after waking in morning.  Psychiatric/Behavioral:       Feeling better on Wellbutrin.        Past Medical History  Diagnosis Date  . Thyroid disease   . Cataract   . Depression   . GERD (gastroesophageal reflux disease)   . Glaucoma   . Type 2 diabetes mellitus ([place])     manages with diet and exercise    Social History   Social History  . Marital Status: Widowed    Spouse Name: N/A  . Number of Children: N/A  . Years of Education: N/A   Occupational History  . Not on file.   Social History Main Topics  . Smoking status: Never Smoker   . Smokeless tobacco: Not on file  . Alcohol Use: Yes  . Drug Use: No  . Sexual Activity: Yes   Other Topics Concern  . Not on file   Social History Narrative   Widower.   Step children.   Environmental health practitioner.   Enjoys riding hot air balloons, spending time with friends, reading, jewelry    Past Surgical History  Procedure Laterality Date  . Eye surgery Bilateral     cataract sx  . Tonsillectomy    . Cardiac catheterization  1998  . Colonoscopy  10-30-2004  . Breast biopsy  2010    Family History  Problem Relation Age of Onset  . Stroke Mother   . Heart disease Father     Allergies  Allergen Reactions  . Antihistamines, Chlorpheniramine-Type   . Ciprocin-Fluocin-Procin [Fluocinolone]     Current  Outpatient Prescriptions on File Prior to Visit  Medication Sig Dispense Refill  . buPROPion (WELLBUTRIN XL) 150 MG 24 hr tablet TK 1 T PO QAM FOR 4 DAYS THEN 2 QAM FOR 10 DAYS THEN 3 QAM  1  . latanoprost (XALATAN) 0.005 % ophthalmic solution 1 drop at bedtime.    Marland Kitchen levothyroxine (SYNTHROID, LEVOTHROID) 100 MCG tablet Take 1 tablet (100 mcg total) by mouth daily. 90 tablet 3   No current facility-administered medications on file prior to visit.    BP 126/80 mmHg  Pulse 78  Temp(Src) 98.1 F (36.7 C) (Oral)   Ht 5\' 1"  (1.549 m)  Wt 209 lb 12.8 oz (95.165 kg)  BMI 39.66 kg/m2  SpO2 97%    Objective:   Physical Exam  Constitutional: She is oriented to person, place, and time. She appears well-nourished. She appears ill.  HENT:  Right Ear: Tympanic membrane and ear canal normal.  Left Ear: Tympanic membrane and ear canal normal.  Nose: Nose normal.  Mouth/Throat: Oropharynx is clear and moist.  Eyes: Conjunctivae and EOM are normal. Pupils are equal, round, and reactive to light.  Neck: Neck supple. No thyromegaly present.  Cardiovascular: Normal rate and regular rhythm.   No murmur heard. Pulmonary/Chest: Effort normal. She has no decreased breath sounds. She has no wheezes. She has rhonchi in the right upper field and the left upper field.  Abdominal: Soft. Bowel sounds are normal. There is no tenderness.  Musculoskeletal: Normal range of motion.  Lymphadenopathy:    She has no cervical adenopathy.  Neurological: She is alert and oriented to person, place, and time. She has normal reflexes. No cranial nerve deficit.  Skin: Skin is warm and dry. No rash noted.  Psychiatric: She has a normal mood and affect.          Assessment & Plan:  URI:  Cough x 2+ weeks, also with fatigue. No other symptoms. Overall feeling improved except for fatigue and cough, still doesn't feel well. Exam with questionable lung sounds to upper and lower lobes. Does appear ill. Due to duration of symptoms without resolve and fatigue, will treat for possible early bacterial involvement post viral URI. Zpak and tessalon pearls sent to pharmacy. Fluids, rest, return precautions provided.

## 2016-03-30 NOTE — Assessment & Plan Note (Signed)
Stable today.  Will continue to monitor.

## 2016-03-30 NOTE — Assessment & Plan Note (Signed)
Above goal, will allow her 3 months for improvement, if no improvement will add statin.

## 2016-03-31 ENCOUNTER — Encounter: Payer: Self-pay | Admitting: *Deleted

## 2016-04-01 ENCOUNTER — Encounter: Payer: Self-pay | Admitting: General Surgery

## 2016-04-01 ENCOUNTER — Ambulatory Visit: Payer: BLUE CROSS/BLUE SHIELD | Admitting: Anesthesiology

## 2016-04-01 ENCOUNTER — Ambulatory Visit
Admission: RE | Admit: 2016-04-01 | Discharge: 2016-04-01 | Disposition: A | Discharge status: Home / Self Care | Payer: BLUE CROSS/BLUE SHIELD | Source: Ambulatory Visit | Attending: General Surgery | Admitting: General Surgery

## 2016-04-01 ENCOUNTER — Encounter
Admission: RE | Disposition: A | Discharge status: Home / Self Care | Payer: Self-pay | Source: Ambulatory Visit | Attending: General Surgery

## 2016-04-01 DIAGNOSIS — F329 Major depressive disorder, single episode, unspecified: Secondary | ICD-10-CM | POA: Insufficient documentation

## 2016-04-01 DIAGNOSIS — K5730 Diverticulosis of large intestine without perforation or abscess without bleeding: Secondary | ICD-10-CM

## 2016-04-01 DIAGNOSIS — Z7984 Long term (current) use of oral hypoglycemic drugs: Secondary | ICD-10-CM | POA: Insufficient documentation

## 2016-04-01 DIAGNOSIS — Z79899 Other long term (current) drug therapy: Secondary | ICD-10-CM | POA: Diagnosis not present

## 2016-04-01 DIAGNOSIS — Z888 Allergy status to other drugs, medicaments and biological substances status: Secondary | ICD-10-CM | POA: Insufficient documentation

## 2016-04-01 DIAGNOSIS — Z9841 Cataract extraction status, right eye: Secondary | ICD-10-CM | POA: Diagnosis not present

## 2016-04-01 DIAGNOSIS — Z9842 Cataract extraction status, left eye: Secondary | ICD-10-CM | POA: Diagnosis not present

## 2016-04-01 DIAGNOSIS — E119 Type 2 diabetes mellitus without complications: Secondary | ICD-10-CM | POA: Insufficient documentation

## 2016-04-01 DIAGNOSIS — E079 Disorder of thyroid, unspecified: Secondary | ICD-10-CM | POA: Insufficient documentation

## 2016-04-01 DIAGNOSIS — Z1211 Encounter for screening for malignant neoplasm of colon: Secondary | ICD-10-CM

## 2016-04-01 DIAGNOSIS — Z6839 Body mass index (BMI) 39.0-39.9, adult: Secondary | ICD-10-CM | POA: Insufficient documentation

## 2016-04-01 DIAGNOSIS — K219 Gastro-esophageal reflux disease without esophagitis: Secondary | ICD-10-CM | POA: Insufficient documentation

## 2016-04-01 DIAGNOSIS — H409 Unspecified glaucoma: Secondary | ICD-10-CM | POA: Diagnosis not present

## 2016-04-01 HISTORY — PX: COLONOSCOPY WITH PROPOFOL: SHX5780

## 2016-04-01 LAB — GLUCOSE, CAPILLARY: Glucose-Capillary: 174 mg/dL — ABNORMAL HIGH (ref 65–99)

## 2016-04-01 SURGERY — COLONOSCOPY WITH PROPOFOL
Anesthesia: General

## 2016-04-01 MED ORDER — PROPOFOL 10 MG/ML IV BOLUS
INTRAVENOUS | Status: DC | PRN
  Administered 2016-04-01: 50 mg via INTRAVENOUS

## 2016-04-01 MED ORDER — LIDOCAINE HCL (CARDIAC) 20 MG/ML IV SOLN
INTRAVENOUS | Status: DC | PRN
  Administered 2016-04-01: 30 mg via INTRAVENOUS

## 2016-04-01 MED ORDER — SODIUM CHLORIDE 0.9 % IV SOLN
INTRAVENOUS | Status: DC
  Administered 2016-04-01: 09:00:00 via INTRAVENOUS

## 2016-04-01 MED ORDER — PROPOFOL 500 MG/50ML IV EMUL
INTRAVENOUS | Status: DC | PRN
  Administered 2016-04-01: 160 ug/kg/min via INTRAVENOUS

## 2016-04-01 MED ORDER — MIDAZOLAM HCL 5 MG/5ML IJ SOLN
INTRAMUSCULAR | Status: DC | PRN
  Administered 2016-04-01: 1 mg via INTRAVENOUS

## 2016-04-01 MED ORDER — FENTANYL CITRATE (PF) 100 MCG/2ML IJ SOLN
INTRAMUSCULAR | Status: DC | PRN
  Administered 2016-04-01: 50 ug via INTRAVENOUS

## 2016-04-01 NOTE — Anesthesia Postprocedure Evaluation (Signed)
Anesthesia Post Note  Patient: Valerie Gordon  Procedure(s) Performed: Procedure(s) (LRB): COLONOSCOPY WITH PROPOFOL (N/A)  Patient location during evaluation: PACU Anesthesia Type: General Level of consciousness: awake Pain management: pain level controlled Vital Signs Assessment: post-procedure vital signs reviewed and stable Respiratory status: spontaneous breathing Cardiovascular status: blood pressure returned to baseline Anesthetic complications: no    Last Vitals:  Filed Vitals:   04/01/16 1002 04/01/16 1010  BP: 108/53 110/67  Pulse: 84 74  Temp:    Resp: 16 14    Last Pain: There were no vitals filed for this visit.               VAN STAVEREN,Libia Fazzini

## 2016-04-01 NOTE — H&P (Signed)
Valerie Gordon [id number] Sep 15, 1951     HPI: Candidate for screening colonoscopy. Recent URI, now resolving on oral antibiotics.   Prescriptions prior to admission  Medication Sig Dispense Refill Last Dose  . buPROPion (WELLBUTRIN XL) 150 MG 24 hr tablet TK 1 T PO QAM FOR 4 DAYS THEN 2 QAM FOR 10 DAYS THEN 3 QAM  1 03/31/2016 at 0700  . levothyroxine (SYNTHROID, LEVOTHROID) 100 MCG tablet Take 1 tablet (100 mcg total) by mouth daily. 90 tablet 3 03/31/2016 at 0700  . azithromycin (ZITHROMAX) 250 MG tablet Take 2 tablets by mouth today, then 1 tablet daily for 4 additional days. 6 tablet 0   . benzonatate (TESSALON) 200 MG capsule Take 1 capsule (200 mg total) by mouth 3 (three) times daily as needed for cough. 30 capsule 0   . latanoprost (XALATAN) 0.005 % ophthalmic solution 1 drop at bedtime.   Taking  . metFORMIN (GLUCOPHAGE) 1000 MG tablet Take 1 tablet (1,000 mg total) by mouth 2 (two) times daily with a meal. 60 tablet 3   . zoster vaccine live, PF, (ZOSTAVAX) 16109 UNT/0.65ML injection Inject 19,400 Units into the skin once. 1 each 0    Allergies  Allergen Reactions  . Antihistamines, Chlorpheniramine-Type   . Ciprocin-Fluocin-Procin [Fluocinolone]    Past Medical History  Diagnosis Date  . Thyroid disease   . Cataract   . Depression   . GERD (gastroesophageal reflux disease)   . Glaucoma   . Type 2 diabetes mellitus ([place])     manages with diet and exercise   Past Surgical History  Procedure Laterality Date  . Eye surgery Bilateral     cataract sx  . Tonsillectomy    . Cardiac catheterization  1998  . Colonoscopy  10-30-2004  . Breast biopsy  2010   Social History   Social History  . Marital Status: Widowed    Spouse Name: N/A  . Number of Children: N/A  . Years of Education: N/A   Occupational History  . Not on file.   Social History Main Topics  . Smoking status: Never Smoker   . Smokeless tobacco: Not on file  . Alcohol Use: Yes  . Drug Use: No  . Sexual  Activity: Yes   Other Topics Concern  . Not on file   Social History Narrative   Widower.   Step children.   Environmental health practitioner.   Enjoys riding hot air balloons, spending time with friends, reading, jewelry   Social History   Social History Narrative   Widower.   Step children.   Environmental health practitioner.   Enjoys riding hot air balloons, spending time with friends, reading, jewelry     ROS: Negative.     PE: HEENT: Negative. Lungs: Clear. Cardio: RR. Robert Bellow 04/01/2016   Assessment/Plan:  Proceed with planned endoscopy.

## 2016-04-01 NOTE — Transfer of Care (Signed)
Immediate Anesthesia Transfer of Care Note  Patient: Valerie Gordon  Procedure(s) Performed: Procedure(s): COLONOSCOPY WITH PROPOFOL (N/A)  Patient Location: PACU and Short Stay  Anesthesia Type:General  Level of Consciousness: awake and patient cooperative  Airway & Oxygen Therapy: Patient Spontanous Breathing and Patient connected to nasal cannula oxygen  Post-op Assessment: Report given to RN and Post -op Vital signs reviewed and stable  Post vital signs: Reviewed and stable  Last Vitals:  Filed Vitals:   04/01/16 0901  BP: 117/65  Pulse: 70  Temp: 37 C  Resp: 17    Complications: No apparent anesthesia complications

## 2016-04-01 NOTE — Op Note (Signed)
[facility] Patient Name: Valerie Gordon Procedure Date: 04/01/2016 9:24 AM MRN: [id number] Account #: [id number] Date of Birth: [birth year] Admit Type: Outpatient Age: 65 Room: [facility] ENDO ROOM 1 Gender: Female Note Status: Finalized Procedure:            Colonoscopy Indications:          Screening for colorectal malignant neoplasm Providers:            Robert Bellow, MD Referring MD:         Pleas Koch (Referring MD) Medicines:            Monitored Anesthesia Care Complications:        No immediate complications. Procedure:            Pre-Anesthesia Assessment:                       - Prior to the procedure, a History and Physical was                        performed, and patient medications, allergies and                        sensitivities were reviewed. The patient's tolerance of                        previous anesthesia was reviewed.                       - The risks and benefits of the procedure and the                        sedation options and risks were discussed with the                        patient. All questions were answered and informed                        consent was obtained.                       After obtaining informed consent, the colonoscope was                        passed under direct vision. Throughout the procedure,                        the patient's blood pressure, pulse, and oxygen                        saturations were monitored continuously. The                        Colonoscope was introduced through the anus and                        advanced to the the cecum, identified by appendiceal                        orifice and ileocecal valve. The colonoscopy was  performed without difficulty. The patient tolerated the                        procedure well. The quality of the bowel preparation                        was excellent. Findings:      A few medium-mouthed diverticula  were found in the sigmoid colon and       ascending colon.      The retroflexed view of the distal rectum and anal verge was normal and       showed no anal or rectal abnormalities. Impression:           - Diverticulosis in the sigmoid colon and in the                        ascending colon.                       - The distal rectum and anal verge are normal on                        retroflexion view.                       - No specimens collected. Recommendation:       - Repeat colonoscopy in 10 years for screening purposes. Procedure Code(s):    --- Professional ---                       [phone], Colonoscopy, flexible; diagnostic, including                        collection of specimen(s) by brushing or washing, when                        performed (separate procedure) Diagnosis Code(s):    --- Professional ---                       Z12.11, Encounter for screening for malignant neoplasm                        of colon                       K57.30, Diverticulosis of large intestine without                        perforation or abscess without bleeding CPT copyright 2016 American Medical Association. All rights reserved. The codes documented in this report are preliminary and upon coder review may  be revised to meet current compliance requirements. Robert Bellow, MD 04/01/2016 9:57:17 AM This report has been signed electronically. Number of Addenda: 0 Note Initiated On: 04/01/2016 9:24 AM Scope Withdrawal Time: 0 hours 9 minutes 23 seconds  Total Procedure Duration: 0 hours 18 minutes 53 seconds       [facility]

## 2016-04-01 NOTE — Anesthesia Preprocedure Evaluation (Signed)
Anesthesia Evaluation  Patient identified by MRN, date of birth, ID band Patient awake    Reviewed: Allergy & Precautions, NPO status , Patient's Chart, lab work & pertinent test results  Airway Mallampati: II       Dental  (+) Teeth Intact   Pulmonary Recent URI ,    + rhonchi        Cardiovascular Exercise Tolerance: Good  Rhythm:Regular Rate:Normal     Neuro/Psych Depression    GI/Hepatic Neg liver ROS, GERD  ,  Endo/Other  diabetes, Type 2, Oral Hypoglycemic AgentsHypothyroidism Morbid obesity  Renal/GU negative Renal ROS     Musculoskeletal   Abdominal (+) + obese,   Peds  Hematology   Anesthesia Other Findings   Reproductive/Obstetrics                             Anesthesia Physical Anesthesia Plan  ASA: II  Anesthesia Plan: General   Post-op Pain Management:    Induction: Intravenous  Airway Management Planned: Natural Airway and Nasal Cannula  Additional Equipment:   Intra-op Plan:   Post-operative Plan:   Informed Consent: I have reviewed the patients History and Physical, chart, labs and discussed the procedure including the risks, benefits and alternatives for the proposed anesthesia with the patient or authorized representative who has indicated his/her understanding and acceptance.     Plan Discussed with: CRNA  Anesthesia Plan Comments:         Anesthesia Quick Evaluation

## 2016-04-28 ENCOUNTER — Encounter: Payer: Self-pay | Admitting: Primary Care

## 2016-06-29 ENCOUNTER — Ambulatory Visit: Payer: BLUE CROSS/BLUE SHIELD | Admitting: Primary Care

## 2016-07-01 ENCOUNTER — Ambulatory Visit: Payer: BLUE CROSS/BLUE SHIELD | Admitting: Primary Care

## 2016-07-13 ENCOUNTER — Ambulatory Visit: Payer: BLUE CROSS/BLUE SHIELD | Admitting: Primary Care

## 2016-07-27 ENCOUNTER — Ambulatory Visit: Payer: BLUE CROSS/BLUE SHIELD | Admitting: Primary Care

## 2016-07-28 ENCOUNTER — Encounter: Payer: Self-pay | Admitting: Primary Care

## 2016-07-28 ENCOUNTER — Ambulatory Visit (INDEPENDENT_AMBULATORY_CARE_PROVIDER_SITE_OTHER): Payer: BLUE CROSS/BLUE SHIELD | Admitting: Primary Care

## 2016-07-28 VITALS — BP 126/84 | HR 78 | Temp 98.6°F | Ht 61.0 in | Wt 191.0 lb

## 2016-07-28 DIAGNOSIS — E559 Vitamin D deficiency, unspecified: Secondary | ICD-10-CM | POA: Diagnosis not present

## 2016-07-28 DIAGNOSIS — L293 Anogenital pruritus, unspecified: Secondary | ICD-10-CM

## 2016-07-28 DIAGNOSIS — E119 Type 2 diabetes mellitus without complications: Secondary | ICD-10-CM

## 2016-07-28 DIAGNOSIS — E785 Hyperlipidemia, unspecified: Secondary | ICD-10-CM

## 2016-07-28 LAB — BASIC METABOLIC PANEL
BUN: 14 mg/dL (ref 6–23)
CHLORIDE: 98 meq/L (ref 96–112)
CO2: 28 mEq/L (ref 19–32)
Calcium: 10.1 mg/dL (ref 8.4–10.5)
Creatinine, Ser: 1.13 mg/dL (ref 0.40–1.20)
GFR: 51.68 mL/min — ABNORMAL LOW (ref >60.00)
Glucose, Bld: 237 mg/dL — ABNORMAL HIGH (ref 70–99)
POTASSIUM: 4.6 meq/L (ref 3.5–5.1)
Sodium: 132 mEq/L — ABNORMAL LOW (ref 135–145)

## 2016-07-28 LAB — LIPID PANEL
CHOLESTEROL: 225 mg/dL — AB (ref 0–200)
HDL: 61 mg/dL (ref >39.00)
LDL Cholesterol: 131 mg/dL — ABNORMAL HIGH (ref 0–99)
NonHDL: 163.74
Total CHOL/HDL Ratio: 4
Triglycerides: 164 mg/dL — ABNORMAL HIGH (ref 0.0–149.0)
VLDL: 32.8 mg/dL (ref 0.0–40.0)

## 2016-07-28 LAB — MICROALBUMIN / CREATININE URINE RATIO
Creatinine,U: 46.4 mg/dL
Microalb Creat Ratio: 1.5 mg/g (ref 0.0–30.0)

## 2016-07-28 LAB — VITAMIN D 25 HYDROXY (VIT D DEFICIENCY, FRACTURES): VITD: 46.26 ng/mL (ref 30.00–100.00)

## 2016-07-28 LAB — HEMOGLOBIN A1C: HEMOGLOBIN A1C: 7.7 % — AB (ref 4.6–6.5)

## 2016-07-28 MED ORDER — TRIAMCINOLONE ACETONIDE 0.1 % EX CREA: 1.0000 "application " | TOPICAL_CREAM | Freq: Two times a day (BID) | CUTANEOUS | 0 refills | Status: DC

## 2016-07-28 NOTE — Assessment & Plan Note (Signed)
Slightly above goal last visit wanted to work on healthy lifestyle. Lipids pending today, will initiate statin treatment if no improvement.

## 2016-07-28 NOTE — Progress Notes (Signed)
Pre visit review using our clinic review tool, if applicable. No additional management support is needed unless otherwise documented below in the visit note. 

## 2016-07-28 NOTE — Assessment & Plan Note (Signed)
Due for repeat vitamin D today, managed on vitamin D 1000 units daily.

## 2016-07-28 NOTE — Assessment & Plan Note (Signed)
Could not tolerate metformin and has not taken one month, did not notify office if she had stopped taking. We'll obtain A1c today and likely start her on glipizide. Urine microalbumin also pending. We'll consider statin treatment if lipids uncontrolled. Foot exam completed today.

## 2016-07-28 NOTE — Patient Instructions (Addendum)
Complete lab work prior to leaving today.   Continue vitamin D supplements.  You may apply the triamcinolone cream twice daily to affected area x 1 week. Please notify me if no improvement.  It is important that you improve your diet. Please limit carbohydrates in the form of white bread, rice, pasta, cakes, cookies, etc. Increase your consumption of fresh fruits and vegetables, whole grains, lean protein.  Ensure you are consuming 64 ounces of water daily.  I will be in touch with you soon regarding follow up and our next step.  It was a pleasure to see you today!  Diabetes Mellitus and Food It is important for you to manage your blood sugar (glucose) level. Your blood glucose level can be greatly affected by what you eat. Eating healthier foods in the appropriate amounts throughout the day at about the same time each day will help you control your blood glucose level. It can also help slow or prevent worsening of your diabetes mellitus. Healthy eating may even help you improve the level of your blood pressure and reach or maintain a healthy weight.  General recommendations for healthful eating and cooking habits include:  Eating meals and snacks regularly. Avoid going long periods of time without eating to lose weight.  Eating a diet that consists mainly of plant-based foods, such as fruits, vegetables, nuts, legumes, and whole grains.  Using low-heat cooking methods, such as baking, instead of high-heat cooking methods, such as deep frying. Work with your dietitian to make sure you understand how to use the Nutrition Facts information on food labels. HOW CAN FOOD AFFECT ME? Carbohydrates Carbohydrates affect your blood glucose level more than any other type of food. Your dietitian will help you determine how many carbohydrates to eat at each meal and teach you how to count carbohydrates. Counting carbohydrates is important to keep your blood glucose at a healthy level, especially if you  are using insulin or taking certain medicines for diabetes mellitus. Alcohol Alcohol can cause sudden decreases in blood glucose (hypoglycemia), especially if you use insulin or take certain medicines for diabetes mellitus. Hypoglycemia can be a life-threatening condition. Symptoms of hypoglycemia (sleepiness, dizziness, and disorientation) are similar to symptoms of having too much alcohol.  If your health care provider has given you approval to drink alcohol, do so in moderation and use the following guidelines:  Women should not have more than one drink per day, and men should not have more than two drinks per day. One drink is equal to:  12 oz of beer.  5 oz of wine.  1 oz of hard liquor.  Do not drink on an empty stomach.  Keep yourself hydrated. Have water, diet soda, or unsweetened iced tea.  Regular soda, juice, and other mixers might contain a lot of carbohydrates and should be counted. WHAT FOODS ARE NOT RECOMMENDED? As you make food choices, it is important to remember that all foods are not the same. Some foods have fewer nutrients per serving than other foods, even though they might have the same number of calories or carbohydrates. It is difficult to get your body what it needs when you eat foods with fewer nutrients. Examples of foods that you should avoid that are high in calories and carbohydrates but low in nutrients include:  Trans fats (most processed foods list trans fats on the Nutrition Facts label).  Regular soda.  Juice.  Candy.  Sweets, such as cake, pie, doughnuts, and cookies.  Fried foods. WHAT FOODS  CAN I EAT? Eat nutrient-rich foods, which will nourish your body and keep you healthy. The food you should eat also will depend on several factors, including:  The calories you need.  The medicines you take.  Your weight.  Your blood glucose level.  Your blood pressure level.  Your cholesterol level. You should eat a variety of foods,  including:  Protein.  Lean cuts of meat.  Proteins low in saturated fats, such as fish, egg whites, and beans. Avoid processed meats.  Fruits and vegetables.  Fruits and vegetables that may help control blood glucose levels, such as apples, mangoes, and yams.  Dairy products.  Choose fat-free or low-fat dairy products, such as milk, yogurt, and cheese.  Grains, bread, pasta, and rice.  Choose whole grain products, such as multigrain bread, whole oats, and brown rice. These foods may help control blood pressure.  Fats.  Foods containing healthful fats, such as nuts, avocado, olive oil, canola oil, and fish. DOES EVERYONE WITH DIABETES MELLITUS HAVE THE SAME MEAL PLAN? Because every person with diabetes mellitus is different, there is not one meal plan that works for everyone. It is very important that you meet with a dietitian who will help you create a meal plan that is just right for you.   This information is not intended to replace advice given to you by your health care provider. Make sure you discuss any questions you have with your health care provider.   Document Released: 09/10/2005 Document Revised: 01/04/2015 Document Reviewed: 11/10/2013 Elsevier Interactive Patient Education [employer].

## 2016-07-28 NOTE — Progress Notes (Signed)
Subjective:    Patient ID: Valerie Gordon, female    DOB: 01/29/1951, 65 y.o.   MRN: [id number]  HPI  Valerie Gordon is a 65 year old female who presents today for follow up.  1) Type 2 Diabetes: Diagnosed in April 2017. Currently managed on Metformin 1000 mg twice daily. She has not taken her metformin in one month as she experienced diarrhea and jitteriness. She has not been watching her diet over the past 1 month as she has been snacking on sweets. Denies numbness/tingling, chest pain, polyuria.  Her diet currently consists of: Breakfast: Sausage, egg Lunch: Salad, chicken salad sandwich Dinner: Meat and fresh vegetable Snacks: Occasionally, lately has been snacking on pie Desserts: Occasionally, more so recently has increased over the past 1 month Beverages: Water, coffee, wine  2) Hyperlipidemia: Diagnosed in April 2017. LDL above goal at 120, Trigs above goal at 167. She is currently not managed on statin therapy as she wanted time to improve her lifestyle. She is due for repeat lipids today.  3) Vitamin D deficiency: Level of 23 in April 2017. She is currently taking 1000 units daily. She is due for repeat labs today.  4) Pruritus: Located to left groin fold for the past 2 months. Improved with OTC cortisone cream temporarily. Denies rashes, skin breakdown. She does sweat quite often.   Review of Systems  Constitutional: Negative for fever.  Cardiovascular: Negative for chest pain.  Endocrine: Negative for polyuria.  Neurological: Negative for dizziness and numbness.       Past Medical History:  Diagnosis Date  . Cataract   . Depression   . GERD (gastroesophageal reflux disease)   . Glaucoma   . Thyroid disease   . Type 2 diabetes mellitus ([place])    manages with diet and exercise     Social History   Social History  . Marital status: Widowed    Spouse name: N/A  . Number of children: N/A  . Years of education: N/A   Occupational History  . Not on file.    Social History Main Topics  . Smoking status: Never Smoker  . Smokeless tobacco: Not on file  . Alcohol use Yes  . Drug use: No  . Sexual activity: Yes   Other Topics Concern  . Not on file   Social History Narrative   Widower.   Step children.   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 BREAST BIOPSY  2010  . CARDIAC CATHETERIZATION  1998  . COLONOSCOPY  10-30-2004  . COLONOSCOPY WITH PROPOFOL N/A 04/01/2016   Procedure: COLONOSCOPY WITH PROPOFOL;  Surgeon: Robert Bellow, MD;  Location[facility] ENDOSCOPY;  Service: Endoscopy;  Laterality: N/A;  . EYE SURGERY Bilateral    cataract sx  . TONSILLECTOMY      Family History  Problem Relation Age of Onset  . Stroke Mother   . Heart disease Father     Allergies  Allergen Reactions  . Antihistamines, Chlorpheniramine-Type   . Ciprocin-Fluocin-Procin [Fluocinolone]   . Metformin And Related     Jittery, diarrhea    Current Outpatient Prescriptions on File Prior to Visit  Medication Sig Dispense Refill  . buPROPion (WELLBUTRIN XL) 150 MG 24 hr tablet TK 1 T PO QAM FOR 4 DAYS THEN 2 QAM FOR 10 DAYS THEN 3 QAM  1  . latanoprost (XALATAN) 0.005 %  ophthalmic solution 1 drop at bedtime.    Marland Kitchen levothyroxine (SYNTHROID, LEVOTHROID) 100 MCG tablet Take 1 tablet (100 mcg total) by mouth daily. 90 tablet 3   No current facility-administered medications on file prior to visit.     BP 126/84 (BP Location: Left Arm, Patient Position: Sitting, Cuff Size: Normal)   Pulse 78   Temp 98.6 F (37 C) (Oral)   Ht 5\' 1"  (1.549 m)   Wt 191 lb (86.6 kg)   SpO2 97%   BMI 36.09 kg/m    Objective:   Physical Exam  Constitutional: She appears well-nourished.  Neck: Neck supple.  Cardiovascular: Normal rate and regular rhythm.   Pulmonary/Chest: Effort normal and breath sounds normal.  Skin: Skin is warm and dry.  No erythema.  No rashes or skin breakdown to left groin          Assessment & Plan:  Pruritus:  Present to left groin daily for the past several weeks. No rashes or skin breakdown noted on exam. Temporary improvement with cortisone cream. We'll try higher potency steroid cream and have her update if no improvement.  Sheral Flow, NP

## 2016-07-29 ENCOUNTER — Other Ambulatory Visit: Payer: Self-pay | Admitting: Primary Care

## 2016-07-29 DIAGNOSIS — E119 Type 2 diabetes mellitus without complications: Secondary | ICD-10-CM

## 2016-07-29 MED ORDER — GLIPIZIDE ER 10 MG PO TB24: 10.0000 mg | ORAL_TABLET | Freq: Every day | ORAL | 3 refills | Status: DC

## 2016-10-20 ENCOUNTER — Other Ambulatory Visit: Payer: Self-pay | Admitting: Primary Care

## 2016-10-20 DIAGNOSIS — E119 Type 2 diabetes mellitus without complications: Secondary | ICD-10-CM

## 2016-10-20 DIAGNOSIS — E7800 Pure hypercholesterolemia, unspecified: Secondary | ICD-10-CM

## 2016-10-28 ENCOUNTER — Other Ambulatory Visit (INDEPENDENT_AMBULATORY_CARE_PROVIDER_SITE_OTHER): Payer: 59

## 2016-10-28 DIAGNOSIS — E7800 Pure hypercholesterolemia, unspecified: Secondary | ICD-10-CM | POA: Diagnosis not present

## 2016-10-28 DIAGNOSIS — E119 Type 2 diabetes mellitus without complications: Secondary | ICD-10-CM

## 2016-10-28 LAB — LIPID PANEL
Cholesterol: 193 mg/dL (ref 0–200)
HDL: 53.4 mg/dL (ref >39.00)
LDL CALC: 102 mg/dL — AB (ref 0–99)
NonHDL: 139.25
Total CHOL/HDL Ratio: 4
Triglycerides: 184 mg/dL — ABNORMAL HIGH (ref 0.0–149.0)
VLDL: 36.8 mg/dL (ref 0.0–40.0)

## 2016-10-28 LAB — HEMOGLOBIN A1C: Hgb A1c MFr Bld: 7.7 % — ABNORMAL HIGH (ref 4.6–6.5)

## 2017-01-06 ENCOUNTER — Ambulatory Visit (INDEPENDENT_AMBULATORY_CARE_PROVIDER_SITE_OTHER): Payer: Self-pay | Admitting: Primary Care

## 2017-01-06 ENCOUNTER — Encounter: Payer: Self-pay | Admitting: Primary Care

## 2017-01-06 VITALS — BP 126/80 | HR 85 | Temp 98.9°F | Ht 61.0 in | Wt 210.4 lb

## 2017-01-06 DIAGNOSIS — J069 Acute upper respiratory infection, unspecified: Secondary | ICD-10-CM

## 2017-01-06 MED ORDER — BENZONATATE 200 MG PO CAPS: 200.0000 mg | ORAL_CAPSULE | Freq: Three times a day (TID) | ORAL | 0 refills | Status: DC | PRN

## 2017-01-06 MED ORDER — AZITHROMYCIN 250 MG PO TABS: ORAL_TABLET | ORAL | 0 refills | Status: DC

## 2017-01-06 NOTE — Progress Notes (Signed)
Pre visit review using our clinic review tool, if applicable. No additional management support is needed unless otherwise documented below in the visit note. 

## 2017-01-06 NOTE — Patient Instructions (Signed)
Fill the azithromycin antibiotic Friday this week if your symptoms become worse. Take 2 tablets by mouth on day 1, then 1 tablet daily for 4 additional days.  You may take Benzonatate capsules for cough. Take 1 capsule by mouth three times daily as needed for cough.  Try Nyquil at bedtime for cough and sleep.  It was a pleasure to see you today!   Upper Respiratory Infection, Adult Most upper respiratory infections (URIs) are a viral infection of the air passages leading to the lungs. A URI affects the nose, throat, and upper air passages. The most common type of URI is nasopharyngitis and is typically referred to as "the common cold." URIs run their course and usually go away on their own. Most of the time, a URI does not require medical attention, but sometimes a bacterial infection in the upper airways can follow a viral infection. This is called a secondary infection. Sinus and middle ear infections are common types of secondary upper respiratory infections. Bacterial pneumonia can also complicate a URI. A URI can worsen asthma and chronic obstructive pulmonary disease (COPD). Sometimes, these complications can require emergency medical care and may be life threatening. What are the causes? Almost all URIs are caused by viruses. A virus is a type of germ and can spread from one person to another. What increases the risk? You may be at risk for a URI if:  You smoke.  You have chronic heart or lung disease.  You have a weakened defense (immune) system.  You are very young or very old.  You have nasal allergies or asthma.  You work in crowded or poorly ventilated areas.  You work in health care facilities or schools. What are the signs or symptoms? Symptoms typically develop 2-3 days after you come in contact with a cold virus. Most viral URIs last 7-10 days. However, viral URIs from the influenza virus (flu virus) can last 14-18 days and are typically more severe. Symptoms may  include:  Runny or stuffy (congested) nose.  Sneezing.  Cough.  Sore throat.  Headache.  Fatigue.  Fever.  Loss of appetite.  Pain in your forehead, behind your eyes, and over your cheekbones (sinus pain).  Muscle aches. How is this diagnosed? Your health care provider may diagnose a URI by:  Physical exam.  Tests to check that your symptoms are not due to another condition such as:  Strep throat.  Sinusitis.  Pneumonia.  Asthma. How is this treated? A URI goes away on its own with time. It cannot be cured with medicines, but medicines may be prescribed or recommended to relieve symptoms. Medicines may help:  Reduce your fever.  Reduce your cough.  Relieve nasal congestion. Follow these instructions at home:  Take medicines only as directed by your health care provider.  Gargle warm saltwater or take cough drops to comfort your throat as directed by your health care provider.  Use a warm mist humidifier or inhale steam from a shower to increase air moisture. This may make it easier to breathe.  Drink enough fluid to keep your urine clear or pale yellow.  Eat soups and other clear broths and maintain good nutrition.  Rest as needed.  Return to work when your temperature has returned to normal or as your health care provider advises. You may need to stay home longer to avoid infecting others. You can also use a face mask and careful hand washing to prevent spread of the virus.  Increase the usage  of your inhaler if you have asthma.  Do not use any tobacco products, including cigarettes, chewing tobacco, or electronic cigarettes. If you need help quitting, ask your health care provider. How is this prevented? The best way to protect yourself from getting a cold is to practice good hygiene.  Avoid oral or hand contact with people with cold symptoms.  Wash your hands often if contact occurs. There is no clear evidence that vitamin C, vitamin E,  echinacea, or exercise reduces the chance of developing a cold. However, it is always recommended to get plenty of rest, exercise, and practice good nutrition. Contact a health care provider if:  You are getting worse rather than better.  Your symptoms are not controlled by medicine.  You have chills.  You have worsening shortness of breath.  You have brown or red mucus.  You have yellow or brown nasal discharge.  You have pain in your face, especially when you bend forward.  You have a fever.  You have swollen neck glands.  You have pain while swallowing.  You have white areas in the back of your throat. Get help right away if:  You have severe or persistent:  Headache.  Ear pain.  Sinus pain.  Chest pain.  You have chronic lung disease and any of the following:  Wheezing.  Prolonged cough.  Coughing up blood.  A change in your usual mucus.  You have a stiff neck.  You have changes in your:  Vision.  Hearing.  Thinking.  Mood. This information is not intended to replace advice given to you by your health care provider. Make sure you discuss any questions you have with your health care provider. Document Released: 06/09/2001 Document Revised: 08/16/2016 Document Reviewed: 03/21/2014 Elsevier Interactive Patient Education  2017 [employer].

## 2017-01-06 NOTE — Progress Notes (Signed)
Subjective:    Patient ID: Valerie Gordon, female    DOB: [birth year], 66 y.o.   MRN: [id number]  HPI  [name] is a 66 year old female with a history of type 2 diabetes and GERD who presents today with a chief complaint of cough. She also reports chest congestion, wheezing, sore throat. Her symptoms began Friday last week. Her cough is non-productive. She's been taking Alka-Seltzer Cold and Tessalon Pearls with some improvement. She started to notice improvement this morning and was able to sleep last night.   Review of Systems  Constitutional: Positive for fatigue. Negative for chills and fever.  HENT: Positive for congestion, sinus pressure and sore throat.   Respiratory: Positive for cough and wheezing.   Cardiovascular: Negative for chest pain.       Past Medical History:  Diagnosis Date  . Cataract   . Depression   . GERD (gastroesophageal reflux disease)   . Glaucoma   . Thyroid disease   . Type 2 diabetes mellitus (Valerie Gordon)    manages with diet and exercise     Social History   Social History  . Marital status: Widowed    Spouse name: N/A  . Number of children: N/A  . Years of education: N/A   Occupational History  . Not on file.   Social History Main Topics  . Smoking status: Never Smoker  . Smokeless tobacco: Not on file  . Alcohol use Yes  . Drug use: No  . Sexual activity: Yes   Other Topics Concern  . Not on file   Social History Narrative   Widower.   Step children.   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 BREAST BIOPSY  2010  . CARDIAC CATHETERIZATION  1998  . COLONOSCOPY  10-30-2004  . COLONOSCOPY WITH PROPOFOL N/A 04/01/2016   Procedure: COLONOSCOPY WITH PROPOFOL;  Surgeon: Robert Bellow, MD;  Location: [facility] ENDOSCOPY;  Service: Endoscopy;  Laterality: N/A;  . EYE SURGERY Bilateral    cataract sx  .  TONSILLECTOMY      Family History  Problem Relation Age of Onset  . Stroke Mother   . Heart disease Father     Allergies  Allergen Reactions  . Antihistamines, Chlorpheniramine-Type   . Ciprocin-Fluocin-Procin [Fluocinolone]   . Metformin And Related     Jittery, diarrhea    Current Outpatient Prescriptions on File Prior to Visit  Medication Sig Dispense Refill  . buPROPion (WELLBUTRIN XL) 150 MG 24 hr tablet TK 1 T PO QAM FOR 4 DAYS THEN 2 QAM FOR 10 DAYS THEN 3 QAM  1  . glipiZIDE (GLIPIZIDE XL) 10 MG 24 hr tablet Take 1 tablet (10 mg total) by mouth daily with breakfast. 90 tablet 3  . latanoprost (XALATAN) 0.005 % ophthalmic solution 1 drop at bedtime.    Marland Kitchen levothyroxine (SYNTHROID, LEVOTHROID) 100 MCG tablet Take 1 tablet (100 mcg total) by mouth daily. 90 tablet 3  . triamcinolone cream (KENALOG) 0.1 % Apply 1 application topically 2 (two) times daily. 30 g 0   No current facility-administered medications on file prior to visit.     BP 126/80   Pulse 85   Temp 98.9 F (37.2 C) (Oral)   Ht 5\' 1"  (1.549 m)   Wt 210 lb 6.4 oz (95.4 kg)   SpO2 95%   BMI 39.75 kg/m  Objective:   Physical Exam  Constitutional: She appears well-nourished. She does not have a sickly appearance. She appears ill.  HENT:  Right Ear: Tympanic membrane and ear canal normal.  Left Ear: Tympanic membrane and ear canal normal.  Nose: Mucosal edema present. Right sinus exhibits no maxillary sinus tenderness and no frontal sinus tenderness. Left sinus exhibits no maxillary sinus tenderness and no frontal sinus tenderness.  Mouth/Throat: Oropharynx is clear and moist.  Eyes: Conjunctivae are normal.  Neck: Neck supple.  Cardiovascular: Normal rate and regular rhythm.   Pulmonary/Chest: Effort normal and breath sounds normal. She has no wheezes. She has no rales.  Dry cough during exam  Lymphadenopathy:    She has no cervical adenopathy.  Skin: Skin is warm and dry.            Assessment & Plan:  URI:  Cough, congestion, wheezing x 5 days. Overall starting to feel better this morning. Exam today with clear lungs. Does appear ill but not sickly. Vitals stable. Dry cough during exam, no wheezing. Suspect viral URI and seems like she's recovering.  Will treat with supportive measures. Rx for Tessalon pearls provided for day cough, Nyquill HS for night cough. Fluids, rest, return precautions provided.  Sheral Flow, NP

## 2017-01-29 ENCOUNTER — Encounter: Payer: Self-pay | Admitting: Primary Care

## 2017-03-08 ENCOUNTER — Ambulatory Visit: Payer: Self-pay | Admitting: Obstetrics and Gynecology

## 2017-08-28 LAB — HM DIABETES EYE EXAM

## 2017-10-20 ENCOUNTER — Ambulatory Visit: Payer: Self-pay | Admitting: *Deleted

## 2017-10-20 NOTE — Telephone Encounter (Signed)
Dalene Carrow RN also sent this note;I made an appt for Valerie Gordon with [facility] on Friday at 2:15PM. (Routing comment)

## 2017-10-20 NOTE — Telephone Encounter (Signed)
   Reason for Disposition . Mild vulvar itching  Answer Assessment - Initial Assessment Questions 1. SYMPTOM: "What's the main symptom you're concerned about?" (e.g., frequency, incontinence)     Burning and itching around bladder area 2. ONSET: "When did the  ________  start?"     Started this past Friday 3. PAIN: "Is there any pain?" If so, ask: "How bad is it?" (Scale: 1-10; mild, moderate, severe)     Burning, itching outside 4. CAUSE: "What do you think is causing the symptoms?"     Bladder infection 5. OTHER SYMPTOMS: "Do you have any other symptoms?" (e.g., fever, flank pain, blood in urine, pain with urination)     Aching all over, slept all weekend, very tired 6. PREGNANCY: "Is there any chance you are pregnant?" "When was your last menstrual period?"     N/A  Protocols used: VULVAR SYMPTOMS-A-AH, URINARY [facility]

## 2017-10-22 ENCOUNTER — Ambulatory Visit (INDEPENDENT_AMBULATORY_CARE_PROVIDER_SITE_OTHER): Payer: No Typology Code available for payment source | Admitting: Primary Care

## 2017-10-22 ENCOUNTER — Encounter: Payer: Self-pay | Admitting: Primary Care

## 2017-10-22 VITALS — BP 114/72 | HR 78 | Temp 97.8°F | Ht 61.0 in | Wt 208.8 lb

## 2017-10-22 DIAGNOSIS — R229 Localized swelling, mass and lump, unspecified: Secondary | ICD-10-CM

## 2017-10-22 DIAGNOSIS — N898 Other specified noninflammatory disorders of vagina: Secondary | ICD-10-CM | POA: Diagnosis not present

## 2017-10-22 DIAGNOSIS — E039 Hypothyroidism, unspecified: Secondary | ICD-10-CM | POA: Diagnosis not present

## 2017-10-22 DIAGNOSIS — E119 Type 2 diabetes mellitus without complications: Secondary | ICD-10-CM

## 2017-10-22 DIAGNOSIS — R300 Dysuria: Secondary | ICD-10-CM | POA: Diagnosis not present

## 2017-10-22 LAB — POC URINALSYSI DIPSTICK (AUTOMATED)
Bilirubin, UA: NEGATIVE
Blood, UA: NEGATIVE
Ketones, UA: NEGATIVE
Leukocytes, UA: NEGATIVE
Nitrite, UA: NEGATIVE
PROTEIN UA: NEGATIVE
SPEC GRAV UA: 1.025 (ref 1.010–1.025)
UROBILINOGEN UA: 0.2 U/dL
pH, UA: 6 (ref 5.0–8.0)

## 2017-10-22 MED ORDER — TRIAMCINOLONE ACETONIDE 0.5 % EX OINT: 1.0000 "application " | TOPICAL_OINTMENT | Freq: Two times a day (BID) | CUTANEOUS | 0 refills | Status: DC

## 2017-10-22 NOTE — Assessment & Plan Note (Signed)
Has not taken medication in 2-3 months. No recent A1C check or follow up. A1C pending today. 3+ glucose in the urine.

## 2017-10-22 NOTE — Addendum Note (Signed)
Addended by: Jacqualin Combes on: 10/22/2017 03:50 PM   Modules accepted: Orders

## 2017-10-22 NOTE — Patient Instructions (Addendum)
Complete lab work prior to leaving today. I will notify you of your results once received.   Ear Pressure: Try using Flonase (fluticasone) nasal spray. Instill 1 spray in each nostril twice daily.   Apply the triamcinolone ointment twice daily for 1 week. Please call me if no improvement.  Follow up in one month as scheduled.  It was a pleasure to see you today!

## 2017-10-22 NOTE — Progress Notes (Signed)
Subjective:    Patient ID: Valerie Gordon, female    DOB: 16-Aug-1951, 66 y.o.   MRN: [id number]  HPI  Valerie Gordon is a 66 year old female who presents today with multiple complaints. She's not had her glipizide and levothyroxine in 2-3 months.  1) Vaginal Itching: Located to the external vagina that she first noticed about one week ago. She did notice some dysuria earlier this week which has improved. She denies rash, vaginal discharge, urinary frequency, hematuria, pelvic pain.  2) Insect Bite: Located to right popliteal fossa that she noticed two months ago. She [place] and noticed a sensation for which she thought was a mosquito bite. She then started  noticing itchy bumps to her right lower extremity, abdomen, back, and upper extremities. The bumps have since resolved but she's still noticed scabbing with oozing to the popliteal fossa. She's applied neosporin and OTC hydrocortisone cream without much improvement. Overall the site has decreased in size but   3) Ear Pain: Located to left ear that began about one week ago. She also reports body aches, fatigue. This has improved. She denies cough, fevers.    Review of Systems  Constitutional: Positive for chills and fatigue. Negative for fever.  HENT: Positive for ear pain. Negative for sinus pressure.   Respiratory: Negative for cough and shortness of breath.   Cardiovascular: Negative for chest pain.  Genitourinary: Negative for dysuria, hematuria, vaginal bleeding and vaginal discharge.       Vaginal itching  Skin: Positive for color change.       Past Medical History:  Diagnosis Date  . Cataract   . Depression   . GERD (gastroesophageal reflux disease)   . Glaucoma   . Thyroid disease   . Type 2 diabetes mellitus ([place])    manages with diet and exercise     Social History   Social History  . Marital status: Widowed    Spouse name: N/A  . Number of children: N/A  . Years of  education: N/A   Occupational History  . Not on file.   Social History Main Topics  . Smoking status: Never Smoker  . Smokeless tobacco: Never Used  . Alcohol use Yes  . Drug use: No  . Sexual activity: Yes   Other Topics Concern  . Not on file   Social History Narrative   Widower.   Step children.   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 BREAST BIOPSY  2010  . CARDIAC CATHETERIZATION  1998  . COLONOSCOPY  10-30-2004  . COLONOSCOPY WITH PROPOFOL N/A 04/01/2016   Procedure: COLONOSCOPY WITH PROPOFOL;  Surgeon: Robert Bellow, MD;  Location: [facility] ENDOSCOPY;  Service: Endoscopy;  Laterality: N/A;  . EYE SURGERY Bilateral    cataract sx  . TONSILLECTOMY      Family History  Problem Relation Age of Onset  . Stroke Mother   . Heart disease Father     Allergies  Allergen Reactions  . Antihistamines, Chlorpheniramine-Type   . Ciprocin-Fluocin-Procin [Fluocinolone]   . Metformin And Related     Jittery, diarrhea    Current Outpatient Prescriptions on File Prior to Visit  Medication Sig Dispense Refill  . glipiZIDE (GLIPIZIDE XL) 10 MG 24 hr tablet Take 1 tablet (10 mg total) by mouth daily with breakfast. 90 tablet  3  . latanoprost (XALATAN) 0.005 % ophthalmic solution 1 drop at bedtime.    Marland Kitchen levothyroxine (SYNTHROID, LEVOTHROID) 100 MCG tablet Take 1 tablet (100 mcg total) by mouth daily. 90 tablet 3  . triamcinolone cream (KENALOG) 0.1 % Apply 1 application topically 2 (two) times daily. (Patient not taking: Reported on 10/22/2017) 30 g 0   No current facility-administered medications on file prior to visit.     BP 114/72   Pulse 78   Temp 97.8 F (36.6 C) (Oral)   Ht 5\' 1"  (1.549 m)   Wt 208 lb 12.8 oz (94.7 kg)   SpO2 94%   BMI 39.45 kg/m    Objective:   Physical Exam  HENT:  Right Ear: Tympanic membrane is bulging. Tympanic  membrane is not erythematous.  Left Ear: Tympanic membrane is bulging. Tympanic membrane is not erythematous.  Dullness to bilateral TM's  Neck: Neck supple.  Cardiovascular: Normal rate and regular rhythm.   Pulmonary/Chest: Effort normal and breath sounds normal.  Genitourinary:    Cervix exhibits discharge. Cervix exhibits no motion tenderness. There is erythema in the vagina.  Genitourinary Comments: Mild amount of whitish discharge. No foul smell.  Skin: Skin is warm and dry.     0.5 cm rounded, scaly, raised bump to right popliteal fossa. Minimal erythema. Non tender.          Assessment & Plan:  Vaginal Itching:  Present for the past 1 week. Has been outdoors working frequently. Exam today with erythema to labia minora. Wet Prep sent off and pending. UA: 3+ glucose, negative nitrites/blood/leuks No other obvious cause noted on exam.  Will await results.  Ear Pain:  Located to left ear, also with body aches, fatigue. Overall better. Exam today with evidence of bulging TM's bilaterally. Will have her trial flonase OTC and update. Vitals WNL.  Insect Bite:  Located to right popliteal fossa. Doesn't appear infectious. Does appear scaly. Will trial Triamcinolone cream BID x 1 week and have her update.  Sheral Flow, NP

## 2017-10-22 NOTE — Assessment & Plan Note (Signed)
No levothyroxine in 2-3 months. TSH pending today.

## 2017-10-23 LAB — COMPREHENSIVE METABOLIC PANEL
AG Ratio: 1.4 (calc) (ref 1.0–2.5)
ALKALINE PHOSPHATASE (APISO): 89 U/L (ref 33–130)
ALT: 18 U/L (ref 6–29)
AST: 33 U/L (ref 10–35)
Albumin: 4 g/dL (ref 3.6–5.1)
BUN: 16 mg/dL (ref 7–25)
CHLORIDE: 101 mmol/L (ref 98–110)
CO2: 26 mmol/L (ref 20–32)
Calcium: 9.9 mg/dL (ref 8.6–10.4)
Creat: 0.98 mg/dL (ref 0.50–0.99)
Globulin: 2.8 g/dL (calc) (ref 1.9–3.7)
Glucose, Bld: 238 mg/dL — ABNORMAL HIGH (ref 65–99)
Potassium: 4.4 mmol/L (ref 3.5–5.3)
Sodium: 136 mmol/L (ref 135–146)
Total Bilirubin: 0.3 mg/dL (ref 0.2–1.2)
Total Protein: 6.8 g/dL (ref 6.1–8.1)

## 2017-10-23 LAB — HEMOGLOBIN A1C
EAG (MMOL/L): 14.3 (calc)
HEMOGLOBIN A1C: 10.6 %{Hb} — AB (ref <5.7)
MEAN PLASMA GLUCOSE: 258 (calc)

## 2017-10-23 LAB — WET PREP BY MOLECULAR PROBE
CANDIDA SPECIES: NOT DETECTED
GARDNERELLA VAGINALIS: NOT DETECTED
MICRO NUMBER:: 81202740
SPECIMEN QUALITY:: ADEQUATE
Trichomonas vaginosis: NOT DETECTED

## 2017-10-23 LAB — TSH: TSH: 5.19 mIU/L — ABNORMAL HIGH (ref 0.40–4.50)

## 2017-11-09 ENCOUNTER — Other Ambulatory Visit: Payer: Self-pay | Admitting: Primary Care

## 2017-11-09 DIAGNOSIS — E785 Hyperlipidemia, unspecified: Secondary | ICD-10-CM

## 2017-11-17 ENCOUNTER — Other Ambulatory Visit (INDEPENDENT_AMBULATORY_CARE_PROVIDER_SITE_OTHER): Payer: No Typology Code available for payment source

## 2017-11-17 DIAGNOSIS — E785 Hyperlipidemia, unspecified: Secondary | ICD-10-CM | POA: Diagnosis not present

## 2017-11-17 LAB — LIPID PANEL
CHOLESTEROL: 200 mg/dL (ref 0–200)
HDL: 49.4 mg/dL (ref >39.00)
LDL Cholesterol: 121 mg/dL — ABNORMAL HIGH (ref 0–99)
NonHDL: 150.33
Total CHOL/HDL Ratio: 4
Triglycerides: 147 mg/dL (ref 0.0–149.0)
VLDL: 29.4 mg/dL (ref 0.0–40.0)

## 2017-11-23 ENCOUNTER — Ambulatory Visit (INDEPENDENT_AMBULATORY_CARE_PROVIDER_SITE_OTHER): Payer: No Typology Code available for payment source | Admitting: Primary Care

## 2017-11-23 ENCOUNTER — Encounter: Payer: Self-pay | Admitting: Primary Care

## 2017-11-23 VITALS — BP 116/74 | HR 82 | Temp 98.2°F | Ht 61.0 in | Wt 210.8 lb

## 2017-11-23 DIAGNOSIS — E119 Type 2 diabetes mellitus without complications: Secondary | ICD-10-CM | POA: Diagnosis not present

## 2017-11-23 DIAGNOSIS — E785 Hyperlipidemia, unspecified: Secondary | ICD-10-CM | POA: Diagnosis not present

## 2017-11-23 DIAGNOSIS — Z1159 Encounter for screening for other viral diseases: Secondary | ICD-10-CM

## 2017-11-23 DIAGNOSIS — Z23 Encounter for immunization: Secondary | ICD-10-CM | POA: Diagnosis not present

## 2017-11-23 DIAGNOSIS — E039 Hypothyroidism, unspecified: Secondary | ICD-10-CM

## 2017-11-23 DIAGNOSIS — Z1231 Encounter for screening mammogram for malignant neoplasm of breast: Secondary | ICD-10-CM

## 2017-11-23 DIAGNOSIS — Z1239 Encounter for other screening for malignant neoplasm of breast: Secondary | ICD-10-CM

## 2017-11-23 DIAGNOSIS — Z0000 Encounter for general adult medical examination without abnormal findings: Secondary | ICD-10-CM | POA: Diagnosis not present

## 2017-11-23 LAB — TSH: TSH: 4.91 u[IU]/mL — AB (ref 0.35–4.50)

## 2017-11-23 LAB — T4, FREE: FREE T4: 0.93 ng/dL (ref 0.60–1.60)

## 2017-11-23 MED ORDER — LEVOTHYROXINE SODIUM 100 MCG PO TABS: ORAL_TABLET | ORAL | 3 refills | Status: DC

## 2017-11-23 MED ORDER — BLOOD GLUCOSE METER KIT: PACK | 0 refills | Status: DC

## 2017-11-23 MED ORDER — GLIPIZIDE ER 10 MG PO TB24: 10.0000 mg | ORAL_TABLET | Freq: Every day | ORAL | 3 refills | Status: DC

## 2017-11-23 NOTE — Patient Instructions (Signed)
Complete lab work prior to leaving today. I will notify you of your results once received.   Call the [facility] to schedule your mammogram.   Start exercising. You should be getting 150 minutes of moderate intensity exercise weekly.  Continue to work on a healthy diet. Increase vegetables, fruit, whole grains, water.  Start checking your blood sugar twice daily; every morning before breakfast and 2 hours after a meal. Record these readings and send them through my chart in 2-3 weeks.  Schedule a lab only appointment in 2 months so we can repeat your cholesterol and A1C.   You will be due for your Welcome to Medicare Visit next year. Schedule this once you're set up with Medicare.  It was a pleasure to see you today!

## 2017-11-23 NOTE — Assessment & Plan Note (Signed)
She has been compliant to glipizide since last visit. Rx for glucometer kit sent to pharmacy, she will send glucose readings through my chart in 2 weeks. Repeat A1C in 2 months. Strongly encouraged regular exercise and a healthy diet.

## 2017-11-23 NOTE — Progress Notes (Signed)
Subjective:    Patient ID: Valerie Gordon, female    DOB: 1951-05-12, 66 y.o.   MRN: [id number]  HPI  Valerie Gordon is a 66 year old female who presents today for complete physical.  Immunizations: -Tetanus: Completed in 2017 -Influenza: Due. -Pneumonia: Completed in 2017 -Shingles: Completed in 2016  Diet: She endorses a fair diet. Breakfast: Eggs, bacon, oatmeal, occasional toast Lunch: Sandwiches, sometimes salads Dinner: Meat, vegetables Snacks: Occasionally  Desserts: Ice cream (small quantity), once nightly Beverages: Coffee, water, wine  Exercise: She is not currently exercising.  Eye exam: Completes semi-annually Dental exam: Has not completed in several years. Colonoscopy: Completed in 2017, due in 10 years. Pap Smear: UTD per patient. Mammogram: Completed in 2017. Hep C Screen: Due.   Review of Systems  Constitutional: Negative for unexpected weight change.  HENT: Negative for rhinorrhea.   Respiratory: Negative for cough and shortness of breath.   Cardiovascular: Negative for chest pain.  Gastrointestinal: Negative for constipation and diarrhea.  Genitourinary: Negative for difficulty urinating and menstrual problem.       Overall vaginal itching has improved  Musculoskeletal: Negative for arthralgias and myalgias.  Skin: Negative for rash.  Allergic/Immunologic: Negative for environmental allergies.  Neurological: Negative for dizziness, numbness and headaches.  Psychiatric/Behavioral:       Denies concerns for anxiety and depression       Past Medical History:  Diagnosis Date  . Cataract   . Depression   . GERD (gastroesophageal reflux disease)   . Glaucoma   . Thyroid disease   . Type 2 diabetes mellitus ([place])    manages with diet and exercise     Social History   Socioeconomic History  . Marital status: Widowed    Spouse name: Not on file  . Number of children: Not on file  . Years of education: Not on file  . Highest education level:  Not on file  Social Needs  . Financial resource strain: Not on file  . Food insecurity - worry: Not on file  . Food insecurity - inability: Not on file  . Transportation needs - medical: Not on file  . Transportation needs - non-medical: Not on file  Occupational History  . Not on file  Tobacco Use  . Smoking status: Never Smoker  . Smokeless tobacco: Never Used  Substance and Sexual Activity  . Alcohol use: Yes  . Drug use: No  . Sexual activity: Yes  Other Topics Concern  . Not on file  Social History Narrative   Widower.   Step children.   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 BREAST BIOPSY  2010  . CARDIAC CATHETERIZATION  1998  . COLONOSCOPY  10-30-2004  . COLONOSCOPY WITH PROPOFOL N/A 04/01/2016   Procedure: COLONOSCOPY WITH PROPOFOL;  Surgeon: Robert Bellow, MD;  Location: [facility] ENDOSCOPY;  Service: Endoscopy;  Laterality: N/A;  . EYE SURGERY Bilateral    cataract sx  . TONSILLECTOMY      Family History  Problem Relation Age of Onset  . Stroke Mother   . Heart disease Father     Allergies  Allergen Reactions  . Antihistamines, Chlorpheniramine-Type   . Ciprocin-Fluocin-Procin [Fluocinolone]   . Metformin And Related     Jittery, diarrhea    Current Outpatient Medications on File Prior to Visit  Medication Sig Dispense Refill  . latanoprost (XALATAN)  0.005 % ophthalmic solution 1 drop at bedtime.    . triamcinolone ointment (KENALOG) 0.5 % Apply 1 application topically 2 (two) times daily. 30 g 0   No current facility-administered medications on file prior to visit.     BP 116/74   Pulse 82   Temp 98.2 F (36.8 C) (Oral)   Ht 5\' 1"  (1.549 m)   Wt 210 lb 12.8 oz (95.6 kg)   SpO2 95%   BMI 39.83 kg/m    Objective:   Physical Exam  Constitutional: She is oriented to person, place, and time. She appears  well-nourished.  HENT:  Right Ear: Tympanic membrane and ear canal normal.  Left Ear: Tympanic membrane and ear canal normal.  Nose: Nose normal.  Mouth/Throat: Oropharynx is clear and moist.  Eyes: Conjunctivae and EOM are normal. Pupils are equal, round, and reactive to light.  Neck: Neck supple. No thyromegaly present.  Cardiovascular: Normal rate and regular rhythm.  No murmur heard. Pulmonary/Chest: Effort normal and breath sounds normal. She has no rales.  Abdominal: Soft. Bowel sounds are normal. There is no tenderness.  Musculoskeletal: Normal range of motion.  Lymphadenopathy:    She has no cervical adenopathy.  Neurological: She is alert and oriented to person, place, and time. She has normal reflexes. No cranial nerve deficit.  Skin: Skin is warm and dry. No rash noted.  Psychiatric: She has a normal mood and affect.          Assessment & Plan:

## 2017-11-23 NOTE — Assessment & Plan Note (Signed)
Immunizations UTD. Pap UTD per patient. Mammogram due, ordered. Colonoscopy UTD. Discussed the importance of a healthy diet and regular exercise in order for weight loss, and to reduce the risk of other medical problems. Exam unremarkable. Labs with hyperlipidemia. Follow up in 1 year.

## 2017-11-23 NOTE — Assessment & Plan Note (Signed)
LDL above goal. Recommended statin given risk for heart disease/stroke given current uncontrolled diabetes, she kindly declines as she would like to work on weight loss. Will repeat in 2 months with A1C, if above goal then will encourage statin.

## 2017-11-23 NOTE — Assessment & Plan Note (Signed)
Compliant to levothyroxine since last visit. Repeat TSH pending.

## 2017-11-23 NOTE — Addendum Note (Signed)
Addended by: Jacqualin Combes on: 11/23/2017 05:25 PM   Modules accepted: Orders

## 2017-11-24 LAB — HEPATITIS C ANTIBODY
HEP C AB: NONREACTIVE
SIGNAL TO CUT-OFF: 0.03 (ref <1.00)

## 2018-05-09 ENCOUNTER — Other Ambulatory Visit: Payer: Self-pay

## 2018-05-09 ENCOUNTER — Encounter: Payer: Self-pay | Admitting: Obstetrics and Gynecology

## 2018-05-09 ENCOUNTER — Ambulatory Visit (INDEPENDENT_AMBULATORY_CARE_PROVIDER_SITE_OTHER): Payer: No Typology Code available for payment source | Admitting: Obstetrics and Gynecology

## 2018-05-09 VITALS — BP 144/78 | HR 77 | Ht 61.0 in | Wt 209.0 lb

## 2018-05-09 DIAGNOSIS — Z1231 Encounter for screening mammogram for malignant neoplasm of breast: Secondary | ICD-10-CM

## 2018-05-09 DIAGNOSIS — N760 Acute vaginitis: Secondary | ICD-10-CM

## 2018-05-09 DIAGNOSIS — Z13820 Encounter for screening for osteoporosis: Secondary | ICD-10-CM | POA: Diagnosis not present

## 2018-05-09 DIAGNOSIS — Z0000 Encounter for general adult medical examination without abnormal findings: Secondary | ICD-10-CM

## 2018-05-09 DIAGNOSIS — Z1289 Encounter for screening for malignant neoplasm of other sites: Secondary | ICD-10-CM | POA: Diagnosis not present

## 2018-05-09 DIAGNOSIS — N898 Other specified noninflammatory disorders of vagina: Secondary | ICD-10-CM

## 2018-05-09 DIAGNOSIS — Z1239 Encounter for other screening for malignant neoplasm of breast: Secondary | ICD-10-CM

## 2018-05-09 MED ORDER — ESTROGENS, CONJUGATED 0.625 MG/GM VA CREA: 0.5000 g | TOPICAL_CREAM | Freq: Every day | VAGINAL | 0 refills | Status: AC

## 2018-05-09 NOTE — Progress Notes (Signed)
Gynecology Annual Exam  PCP: Pleas Koch, NP  Chief Complaint:  Chief Complaint  Patient presents with  . Gynecologic Exam    c/o external vaginal itching x ~656mo    History of Present Illness:Patient is a 67y.o. No obstetric history on file. presents for annual exam. The patient complains of vaginal itching that has been present for about 2 months.   LMP: No LMP recorded. Patient is postmenopausal. Menarche:not applicable Menopause: 47 The patient is sexually active. She denies dyspareunia.  The patient does perform self breast exams.  There is no notable family history of breast or ovarian cancer in her family.  The patient wears seatbelts: yes.   The patient has regular exercise: no.    The patient denies current symptoms of depression.     Review of Systems: Review of Systems  Constitutional: Negative for chills, fever, malaise/fatigue and weight loss.  HENT: Negative for congestion, hearing loss and sinus pain.   Eyes: Negative for blurred vision and double vision.  Respiratory: Negative for cough, sputum production, shortness of breath and wheezing.   Cardiovascular: Negative for chest pain, palpitations, orthopnea and leg swelling.  Gastrointestinal: Negative for abdominal pain, constipation, diarrhea, nausea and vomiting.  Genitourinary: Negative for dysuria, flank pain, frequency, hematuria and urgency.  Musculoskeletal: Negative for back pain, falls and joint pain.  Skin: Negative for itching and rash.  Neurological: Negative for dizziness and headaches.  Psychiatric/Behavioral: Negative for depression, substance abuse and suicidal ideas. The patient is not nervous/anxious.     Past Medical History:  Past Medical History:  Diagnosis Date  . Cataract   . Depression   . GERD (gastroesophageal reflux disease)   . Glaucoma   . Thyroid disease   . [place]    manages with diet and exercise    Past Surgical History:  Past  Surgical History:  Procedure Laterality Date  . BREAST BIOPSY  2010  . CARDIAC CATHETERIZATION  1998  . COLONOSCOPY  10-30-2004  . COLONOSCOPY WITH PROPOFOL N/A 04/01/2016   Procedure: COLONOSCOPY WITH PROPOFOL;  Surgeon: JRobert Bellow MD;  [facility];  Service: Endoscopy;  Laterality: N/A;  . EYE SURGERY Bilateral    cataract sx  . TONSILLECTOMY      Gynecologic History:  No LMP recorded. Patient is postmenopausal. Last Pap: Results were: 2017 NIL  Last mammogram: 2016 Results were: BI-RAD II  Obstetric History: No obstetric history on file.  Family History:  Family History  Problem Relation Age of Onset  . Stroke Mother   . Heart disease Father     Social History:  Social History   Socioeconomic History  . Marital status: Widowed    Spouse name: Not on file  . Number of children: Not on file  . Years of education: Not on file  . Highest education level: Not on file  Occupational History  . Not on file  Social Needs  . Financial resource strain: Not on file  . Food insecurity:    Worry: Not on file    Inability: Not on file  . Transportation needs:    Medical: Not on file    Non-medical: Not on file  Tobacco Use  . Smoking status: Never Smoker  . Smokeless tobacco: Never Used  Substance and Sexual Activity  . Alcohol use: Yes  . Drug use: No  . Sexual activity: Yes  Lifestyle  . Physical activity:    Days per week:  0 days    Minutes per session: Not on file  . Stress: Not on file  Relationships  . Social connections:    Talks on phone: Not on file    Gets together: Not on file    Attends religious service: Not on file    Active member of club or organization: Not on file    Attends meetings of clubs or organizations: Not on file    Relationship status: Not on file  . Intimate partner violence:    Fear of current or ex partner: Not on file    Emotionally abused: Not on file    Physically abused: Not on file    Forced sexual  activity: Not on file  Other Topics Concern  . Not on file  Social History Narrative   Widower.   Step children.   President of insurance agency.   Enjoys riding hot air balloons, spending time with friends, reading, jewelry    Allergies:  Allergies  Allergen Reactions  . Antihistamines, Chlorpheniramine-Type   . Ciprocin-Fluocin-Procin [Fluocinolone]   . Metformin And Related     Jittery, diarrhea    Medications: Prior to Admission medications   Medication Sig Start Date End Date Taking? Authorizing Provider  blood glucose meter kit and supplies Dispense based on patient and insurance preference. Use up to three times daily as directed. 11/23/17  Yes Clark, Katherine K, NP  glipiZIDE (GLIPIZIDE XL) 10 MG 24 hr tablet Take 1 tablet (10 mg total) by mouth daily with breakfast. 11/23/17  Yes Clark, Katherine K, NP  latanoprost (XALATAN) 0.005 % ophthalmic solution 1 drop at bedtime.   Yes [provider]  levothyroxine (SYNTHROID, LEVOTHROID) 100 MCG tablet Take 1 tablet by mouth every morning on an empty stomach with a full glass of water. 11/23/17  Yes Clark, Katherine K, NP  triamcinolone ointment (KENALOG) 0.5 % Apply 1 application topically 2 (two) times daily. Patient not taking: Reported on 05/09/2018 10/22/17   Clark, Katherine K, NP    Physical Exam Vitals: Blood pressure (!) 144/78, pulse 77, height 5' 1" (1.549 m), weight 209 lb (94.8 kg).  General: NAD HEENT: normocephalic, anicteric Thyroid: no enlargement, no palpable nodules Pulmonary: No increased work of breathing, CTAB Cardiovascular: RRR, distal pulses 2+ Breast: Breast symmetrical, no tenderness, no palpable nodules or masses, no skin or nipple retraction present, no nipple discharge.  No axillary or supraclavicular lymphadenopathy. Abdomen: NABS, soft, non-tender, non-distended.  Umbilicus without lesions.  No hepatomegaly, splenomegaly or masses palpable. No evidence of hernia   Genitourinary:  External: Normal external female genitalia.  Normal urethral meatus, normal Bartholin's and Skene's glands.  Atrophic appearing tissue.  Vagina: Normal vaginal mucosa, no evidence of prolapse.    Cervix: Grossly normal in appearance, no bleeding  Uterus: Non-enlarged, mobile, normal contour.  No CMT  Adnexa: ovaries non-enlarged, no adnexal masses  Rectal: deferred  Lymphatic: no evidence of inguinal lymphadenopathy Extremities: no edema, erythema, or tenderness Neurologic: Grossly intact Psychiatric: mood appropriate, affect full  Female chaperone present for pelvic and breast  portions of the physical exam     Assessment: 66 y.o. No obstetric history on file. routine annual exam  Plan: Problem List Items Addressed This Visit    None    Visit Diagnoses    Screening for osteoporosis    -  Primary   Relevant Orders   DG Bone Density   Healthcare maintenance       Screening breast examination         Encounter for pelvic screening for cancer       Acute vaginitis       Relevant Orders   NuSwab BV and Candida, NAA (Completed)   Vaginal itching       Relevant Medications   conjugated estrogens (PREMARIN) vaginal cream   Other Relevant Orders   NuSwab BV and Candida, NAA (Completed)      1) Mammogram - recommend yearly screening mammogram.  Mammogram  was ordered by her primary care provider. Patient is trying to reduce health care costs and is waiting on mammogram until July.  2) STI screening  was offered and declined  3) ASCCP guidelines and rational discussed.  Patient opts for every 3 years screening interval  4) Osteoporosis  - per USPTF routine screening DEXA at age 88 - FRAX 41 year major fracture risk 70,  10 year hip fracture risk 2.3  Consider FDA-approved medical therapies in postmenopausal women and men aged 26 years and older, based on the following: a) A hip or vertebral (clinical or morphometric) fracture b) T-score ? -2.5 at the femoral  neck or spine after appropriate evaluation to exclude secondary causes C) Low bone mass (T-score between -1.0 and -2.5 at the femoral neck or spine) and a 10-year probability of a hip fracture ? 3% or a 10-year probability of a major osteoporosis-related fracture ? 20% based on the [place]-adapted WHO algorithm   5) Routine healthcare maintenance including cholesterol, diabetes screening discussed managed by PCP  6) Colonoscopy 04/01/16.  Screening recommended starting at age 73 for average risk individuals, age 42 for individuals deemed at increased risk (including African Americans) and recommended to continue until age 67.  For patient age 78-85 individualized approach is recommended.  Gold standard screening is via colonoscopy, Cologuard screening is an acceptable alternative for patient unwilling or unable to undergo colonoscopy.  "Colorectal cancer screening for average?risk adults: 2018 guideline update from the American Cancer Society"CA: A Cancer Journal for Clinicians: May 26, 2017   7)Vaginal itching- likely secondary to atrophy. Will prescribe trial of premarin for 12 weeks.   8) Return in about 1 year (around 05/10/2019) for annual.  Adrian Prows MD [place] 05/29/18 5:36 PM

## 2018-05-11 LAB — NUSWAB BV AND CANDIDA, NAA
CANDIDA ALBICANS, NAA: NEGATIVE
Candida glabrata, NAA: NEGATIVE

## 2018-05-11 NOTE — Progress Notes (Signed)
Negative, released to mychart

## 2018-05-29 ENCOUNTER — Encounter: Payer: Self-pay | Admitting: Obstetrics and Gynecology

## 2018-08-02 ENCOUNTER — Ambulatory Visit (INDEPENDENT_AMBULATORY_CARE_PROVIDER_SITE_OTHER): Payer: Medicare HMO | Admitting: Family

## 2018-08-02 ENCOUNTER — Encounter: Payer: Self-pay | Admitting: Primary Care

## 2018-08-02 ENCOUNTER — Ambulatory Visit: Payer: Self-pay | Admitting: *Deleted

## 2018-08-02 ENCOUNTER — Other Ambulatory Visit (INDEPENDENT_AMBULATORY_CARE_PROVIDER_SITE_OTHER): Payer: Medicare HMO

## 2018-08-02 ENCOUNTER — Encounter: Payer: Self-pay | Admitting: Family

## 2018-08-02 VITALS — BP 132/74 | HR 84 | Temp 97.4°F | Ht 61.0 in | Wt 211.1 lb

## 2018-08-02 DIAGNOSIS — E119 Type 2 diabetes mellitus without complications: Secondary | ICD-10-CM

## 2018-08-02 DIAGNOSIS — R220 Localized swelling, mass and lump, head: Secondary | ICD-10-CM | POA: Diagnosis not present

## 2018-08-02 LAB — COMPREHENSIVE METABOLIC PANEL
ALT: 17 U/L (ref 0–35)
AST: 33 U/L (ref 0–37)
Albumin: 4.3 g/dL (ref 3.5–5.2)
Alkaline Phosphatase: 92 U/L (ref 39–117)
BUN: 13 mg/dL (ref 6–23)
CHLORIDE: 99 meq/L (ref 96–112)
CO2: 29 meq/L (ref 19–32)
Calcium: 9.9 mg/dL (ref 8.4–10.5)
Creatinine, Ser: 0.94 mg/dL (ref 0.40–1.20)
GFR: 63.51 mL/min (ref >60.00)
GLUCOSE: 312 mg/dL — AB (ref 70–99)
POTASSIUM: 4.4 meq/L (ref 3.5–5.1)
Sodium: 134 mEq/L — ABNORMAL LOW (ref 135–145)
Total Bilirubin: 0.3 mg/dL (ref 0.2–1.2)
Total Protein: 7.4 g/dL (ref 6.0–8.3)

## 2018-08-02 LAB — CBC WITH DIFFERENTIAL/PLATELET
BASOS PCT: 0.8 % (ref 0.0–3.0)
Basophils Absolute: 0.1 10*3/uL (ref 0.0–0.1)
EOS PCT: 3.6 % (ref 0.0–5.0)
Eosinophils Absolute: 0.3 10*3/uL (ref 0.0–0.7)
HEMATOCRIT: 44.4 % (ref 36.0–46.0)
Hemoglobin: 15.6 g/dL — ABNORMAL HIGH (ref 12.0–15.0)
LYMPHS ABS: 2.5 10*3/uL (ref 0.7–4.0)
LYMPHS PCT: 31.9 % (ref 12.0–46.0)
MCHC: 35.2 g/dL (ref 30.0–36.0)
MCV: 93.1 fl (ref 78.0–100.0)
MONOS PCT: 7.9 % (ref 3.0–12.0)
Monocytes Absolute: 0.6 10*3/uL (ref 0.1–1.0)
NEUTROS ABS: 4.4 10*3/uL (ref 1.4–7.7)
NEUTROS PCT: 55.8 % (ref 43.0–77.0)
PLATELETS: 263 10*3/uL (ref 150.0–400.0)
RBC: 4.77 Mil/uL (ref 3.87–5.11)
RDW: 13.3 % (ref 11.5–15.5)
WBC: 7.9 10*3/uL (ref 4.0–10.5)

## 2018-08-02 LAB — HEMOGLOBIN A1C: Hgb A1c MFr Bld: 11.7 % — ABNORMAL HIGH (ref 4.6–6.5)

## 2018-08-02 MED ORDER — AMOXICILLIN-POT CLAVULANATE 875-125 MG PO TABS: 1.0000 | ORAL_TABLET | Freq: Two times a day (BID) | ORAL | 0 refills | Status: DC

## 2018-08-02 NOTE — Progress Notes (Signed)
Valerie Gordon is a 67 y.o. female with the following history as recorded in EpicCare:  Patient Active Problem List   Diagnosis Date Noted  . Preventative health care 03/30/2016  . Vitamin D deficiency 03/30/2016  . Type 2 diabetes mellitus ([place]) 03/16/2016  . GERD (gastroesophageal reflux disease) 01/16/2015  . Depression 01/16/2015  . Hyperlipidemia 01/16/2015  . Skin lesion of left arm 01/16/2015  . Obesity (BMI 30-39.9) 05/31/2012  . Hypothyroidism 05/31/2012    Current Outpatient Medications  Medication Sig Dispense Refill  . blood glucose meter kit and supplies Dispense based on patient and insurance preference. Use up to three times daily as directed. 1 each 0  . conjugated estrogens (PREMARIN) vaginal cream Place 2.86 Applicatorfuls vaginally daily. 45 g 0  . glipiZIDE (GLIPIZIDE XL) 10 MG 24 hr tablet Take 1 tablet (10 mg total) by mouth daily with breakfast. 90 tablet 3  . latanoprost (XALATAN) 0.005 % ophthalmic solution 1 drop at bedtime.    Marland Kitchen levothyroxine (SYNTHROID, LEVOTHROID) 100 MCG tablet Take 1 tablet by mouth every morning on an empty stomach with a full glass of water. 90 tablet 3  . amoxicillin-clavulanate (AUGMENTIN) 875-125 MG tablet Take 1 tablet by mouth 2 (two) times daily. 20 tablet 0  . triamcinolone ointment (KENALOG) 0.5 % Apply 1 application topically 2 (two) times daily. (Patient not taking: Reported on 05/09/2018) 30 g 0   No current facility-administered medications for this visit.     Allergies: Antihistamines, chlorpheniramine-type; Ciprocin-fluocin-procin [fluocinolone]; and Metformin and related  Past Medical History:  Diagnosis Date  . Cataract   . Depression   . GERD (gastroesophageal reflux disease)   . Glaucoma   . Thyroid disease   . Type 2 diabetes mellitus ([place])    manages with diet and exercise    Past Surgical History:  Procedure Laterality Date  . BREAST BIOPSY  2010  . CARDIAC CATHETERIZATION  1998  . COLONOSCOPY   10-30-2004  . COLONOSCOPY WITH PROPOFOL N/A 04/01/2016   Procedure: COLONOSCOPY WITH PROPOFOL;  Surgeon: Robert Bellow, MD;  Location[facility] ENDOSCOPY;  Service: Endoscopy;  Laterality: N/A;  . EYE SURGERY Bilateral    cataract sx  . TONSILLECTOMY      Family History  Problem Relation Age of Onset  . Stroke Mother   . Heart disease Father     Social History   Tobacco Use  . Smoking status: Never Smoker  . Smokeless tobacco: Never Used  Substance Use Topics  . Alcohol use: Yes    Subjective:  Patient woke up today with sudden onset of right-sided facial swelling; woke up around 7 am with the symptoms and immediately started using cold compresses; notes that vision was affected initially but that has improved as swelling has resolved; by time of appointment, swelling of face has resolved and still has feeling of fullness in right ear; admits overdue for dental care- has not been to the dentist in years; no Advil or Aleve or Benadry today;  Also mentions that continuing to struggle with vaginal itching; saw GYN and felt to be possible atrophy- opted not to use Premarin; in reviewing notes, her last Hgba1c was at > 10- last checked 9 months ago;    Objective:  Vitals:   08/02/18 1553  BP: 132/74  Pulse: 84  Temp: (!) 97.4 F (36.3 C)  TempSrc: Oral  SpO2: 94%  Weight: 211 lb 1.9 oz (95.8 kg)  Height: 5' 1"  (1.549 m)    General:  Well developed, well nourished, in no acute distress  Skin : Warm and dry.  Head: Normocephalic and atraumatic  Eyes: Sclera and conjunctiva clear; pupils round and reactive to light; extraocular movements intact  Ears: External normal; canals clear; tympanic membranes normal  Oropharynx: Pink, supple. No suspicious lesions  Neck: Supple without thyromegaly, adenopathy  Lungs: Respirations unlabored; clear to auscultation bilaterally without wheeze, rales, rhonchi  CVS exam: normal rate and regular rhythm.  Neurologic: Alert and oriented; speech  intact; face symmetrical; moves all extremities well; CNII-XII intact without focal deficit   Assessment:  1. Right facial swelling   2. Type 2 diabetes mellitus without complication, without long-term current use of insulin ([place])     Plan:  1. Swelling has resolved at time of office visit; am suspicious for dental source; will treat for infection with 10 days of Augmentin; encouraged to follow up with dentist; if swelling returns, will need to update facial imaging; 2. Update labs- suspect uncontrolled diabetes is source of vaginal symptoms; will need to follow up with her PCP for medication management.   No follow-ups on file.  Orders Placed This Encounter  Procedures  . CBC w/Diff    Standing Status:   Future    Number of Occurrences:   1    Standing Expiration Date:   08/02/2019  . Comp Met (CMET)    Standing Status:   Future    Number of Occurrences:   1    Standing Expiration Date:   08/02/2019  . HgB A1c    Standing Status:   Future    Number of Occurrences:   1    Standing Expiration Date:   08/02/2019    Requested Prescriptions   Signed Prescriptions Disp Refills  . amoxicillin-clavulanate (AUGMENTIN) 875-125 MG tablet 20 tablet 0    Sig: Take 1 tablet by mouth 2 (two) times daily.

## 2018-08-02 NOTE — Telephone Encounter (Signed)
Called transferred from office by Raquel Sarna requesting that the pt be triaged. Pt had sent MyChart messages to provider previously today. Pt stating she has experienced swelling to the right side of face since 7 am this morning. Pt states that this morning that her right eye was almost swollen shut and she experienced blurry vision. Pt states the blurriness has improved since this morning. Pt states that her eye is red and feels irritated put is not painful and does not have any drainage. Pt states the swelling feels like it is going from her nose to her ear sand seems to be originating from her ear. Pt states that her ear feels swollen in the inside and her ear feels full of water, but she doesn't hear anything making noise inside of her ear.Pt states she has been using a cold compress to her face and states that the swelling is almost gone but still looks puffy. Pt states she does have a little redness to her face and feels pressure to the right side of her face but denies any pain. Pt also mentions that she feels like her equilibrium is a little off. Pt denies any itching, changes in medications or changes in personal care items recently. PCP not available for appts today. Pt offered to go to appt at [facility] office in [place] but no appt availability. Pt encouraged to seek treatment in Urgent Care but pt requesting to be seen at [facility] office. Appt scheduled for today at [facility] location at 3:40pm.  Reason for Disposition . SEVERE swelling of the entire face  Answer Assessment - Initial Assessment Questions 1. ONSET: "When did the swelling start?" (e.g., minutes, hours, days)     This morning at 7am 2. LOCATION: "What part of the face is swollen?"     Right side of face 3. SEVERITY: "How swollen is it?"     Has gone down a lot and eye is open and just looks kind of puffy, it's a lot better than it was 4. ITCHING: "Is there any itching?" If so, ask: "How much?"   (Scale 1-10; mild, moderate or severe)    No 5.  PAIN: "Is the swelling painful to touch?" If so, ask: "How painful is it?"   (Scale 1-10; mild, moderate or severe)     no 6. FEVER: "Do you have a fever?" If so, ask: "What is it, how was it measured, and when did it start?"      No does not have thermometer but face feels warm 7. CAUSE: "What do you think is causing the face swelling?"     unknown 8. RECURRENT SYMPTOM: "Have you had face swelling before?" If so, ask: "When was the last time?" "What happened that time?"     No 9. OTHER SYMPTOMS: "Do you have any other symptoms?" (e.g., toothache, leg swelling)     Ongoing itching problem on the external vagina area but has been seen in office for it and has also been seen by GYN 10. PREGNANCY: "Is there any chance you are pregnant?" "When was your last menstrual period?"       No  Protocols used: [facility]

## 2018-08-10 ENCOUNTER — Ambulatory Visit (INDEPENDENT_AMBULATORY_CARE_PROVIDER_SITE_OTHER): Payer: Medicare HMO | Admitting: Primary Care

## 2018-08-10 ENCOUNTER — Telehealth: Payer: Self-pay | Admitting: *Deleted

## 2018-08-10 ENCOUNTER — Encounter: Payer: Self-pay | Admitting: Primary Care

## 2018-08-10 DIAGNOSIS — E785 Hyperlipidemia, unspecified: Secondary | ICD-10-CM

## 2018-08-10 DIAGNOSIS — E119 Type 2 diabetes mellitus without complications: Secondary | ICD-10-CM | POA: Diagnosis not present

## 2018-08-10 LAB — MICROALBUMIN / CREATININE URINE RATIO
Creatinine,U: 66.2 mg/dL
MICROALB/CREAT RATIO: 1.1 mg/g (ref 0.0–30.0)
Microalb, Ur: <0.7 mg/dL (ref 0.0–1.9)

## 2018-08-10 MED ORDER — PEN NEEDLES 31G X 6 MM MISC: 0 refills | Status: DC

## 2018-08-10 MED ORDER — INSULIN GLARGINE 100 UNIT/ML SOLOSTAR PEN: PEN_INJECTOR | SUBCUTANEOUS | 11 refills | Status: DC

## 2018-08-10 NOTE — Assessment & Plan Note (Signed)
LDL above goal in November 2018, declined statin treatment at that time. Declines lipid check today, would like one month to work on diet first.   Will check lipids next visit.

## 2018-08-10 NOTE — Patient Instructions (Addendum)
Continue Glipizide XL 10 mg once daily with breakfast.   Start Lantus 10 units at bedtime.  Start checking your glucose levels twice daily: Before breakfast 2 hours after lunch 2 hours after dinner Bedtime  Record your readings and bring them to your next visit.   Stop by the lab prior to leaving today. I will notify you of your results once received.   It is important that you improve your diet. Please limit carbohydrates in the form of white bread, rice, pasta, sweets, fast food, fried food, sugary drinks, etc. Increase your consumption of fresh fruits and vegetables, whole grains, lean protein.  Ensure you are consuming 64 ounces of water daily.  You will be contacted regarding your referral to diabetic nutritionist.  Please let us know if you have not been contacted within one week.   Please schedule a follow up appointment in 1 month.  It was a pleasure to see you today!   Diabetes Mellitus and Nutrition When you have diabetes (diabetes mellitus), it is very important to have healthy eating habits because your blood sugar (glucose) levels are greatly affected by what you eat and drink. Eating healthy foods in the appropriate amounts, at about the same times every day, can help you:  Control your blood glucose.  Lower your risk of heart disease.  Improve your blood pressure.  Reach or maintain a healthy weight.  Every person with diabetes is different, and each person has different needs for a meal plan. Your health care provider may recommend that you work with a diet and nutrition specialist (dietitian) to make a meal plan that is best for you. Your meal plan may vary depending on factors such as:  The calories you need.  The medicines you take.  Your weight.  Your blood glucose, blood pressure, and cholesterol levels.  Your activity level.  Other health conditions you have, such as heart or kidney disease.  How do carbohydrates affect me? Carbohydrates affect  your blood glucose level more than any other type of food. Eating carbohydrates naturally increases the amount of glucose in your blood. Carbohydrate counting is a method for keeping track of how many carbohydrates you eat. Counting carbohydrates is important to keep your blood glucose at a healthy level, especially if you use insulin or take certain oral diabetes medicines. It is important to know how many carbohydrates you can safely have in each meal. This is different for every person. Your dietitian can help you calculate how many carbohydrates you should have at each meal and for snack. Foods that contain carbohydrates include:  Bread, cereal, rice, pasta, and crackers.  Potatoes and corn.  Peas, beans, and lentils.  Milk and yogurt.  Fruit and juice.  Desserts, such as cakes, cookies, ice cream, and candy.  How does alcohol affect me? Alcohol can cause a sudden decrease in blood glucose (hypoglycemia), especially if you use insulin or take certain oral diabetes medicines. Hypoglycemia can be a life-threatening condition. Symptoms of hypoglycemia (sleepiness, dizziness, and confusion) are similar to symptoms of having too much alcohol. If your health care provider says that alcohol is safe for you, follow these guidelines:  Limit alcohol intake to no more than 1 drink per day for nonpregnant women and 2 drinks per day for men. One drink equals 12 oz of beer, 5 oz of wine, or 1 oz of hard liquor.  Do not drink on an empty stomach.  Keep yourself hydrated with water, diet soda, or unsweetened iced tea.  Keep in mind that regular soda, juice, and other mixers may contain a lot of sugar and must be counted as carbohydrates.  What are tips for following this plan? Reading food labels  Start by checking the serving size on the label. The amount of calories, carbohydrates, fats, and other nutrients listed on the label are based on one serving of the food. Many foods contain more than  one serving per package.  Check the total grams (g) of carbohydrates in one serving. You can calculate the number of servings of carbohydrates in one serving by dividing the total carbohydrates by 15. For example, if a food has 30 g of total carbohydrates, it would be equal to 2 servings of carbohydrates.  Check the number of grams (g) of saturated and trans fats in one serving. Choose foods that have low or no amount of these fats.  Check the number of milligrams (mg) of sodium in one serving. Most people should limit total sodium intake to less than 2,300 mg per day.  Always check the nutrition information of foods labeled as "low-fat" or "nonfat". These foods may be higher in added sugar or refined carbohydrates and should be avoided.  Talk to your dietitian to identify your daily goals for nutrients listed on the label. Shopping  Avoid buying canned, premade, or processed foods. These foods tend to be high in fat, sodium, and added sugar.  Shop around the outside edge of the grocery store. This includes fresh fruits and vegetables, bulk grains, fresh meats, and fresh dairy. Cooking  Use low-heat cooking methods, such as baking, instead of high-heat cooking methods like deep frying.  Cook using healthy oils, such as olive, canola, or sunflower oil.  Avoid cooking with butter, cream, or high-fat meats. Meal planning  Eat meals and snacks regularly, preferably at the same times every day. Avoid going long periods of time without eating.  Eat foods high in fiber, such as fresh fruits, vegetables, beans, and whole grains. Talk to your dietitian about how many servings of carbohydrates you can eat at each meal.  Eat 4-6 ounces of lean protein each day, such as lean meat, chicken, fish, eggs, or tofu. 1 ounce is equal to 1 ounce of meat, chicken, or fish, 1 egg, or 1/4 cup of tofu.  Eat some foods each day that contain healthy fats, such as avocado, nuts, seeds, and  fish. Lifestyle   Check your blood glucose regularly.  Exercise at least 30 minutes 5 or more days each week, or as told by your health care provider.  Take medicines as told by your health care provider.  Do not use any products that contain nicotine or tobacco, such as cigarettes and e-cigarettes. If you need help quitting, ask your health care provider.  Work with a Social worker or diabetes educator to identify strategies to manage stress and any emotional and social challenges. What are some questions to ask my health care provider?  Do I need to meet with a diabetes educator?  Do I need to meet with a dietitian?  What number can I call if I have questions?  When are the best times to check my blood glucose? Where to find more information:  American Diabetes Association: diabetes.org/food-and-fitness/food  Academy of Nutrition and Dietetics: PokerClues.dk  [employer] of Diabetes and Digestive and Kidney Diseases (NIH): ContactWire.be Summary  A healthy meal plan will help you control your blood glucose and maintain a healthy lifestyle.  Working with a diet and nutrition specialist (  dietitian) can help you make a meal plan that is best for you.  Keep in mind that carbohydrates and alcohol have immediate effects on your blood glucose levels. It is important to count carbohydrates and to use alcohol carefully. This information is not intended to replace advice given to you by your health care provider. Make sure you discuss any questions you have with your health care provider. Document Released: 09/10/2005 Document Revised: 01/18/2017 Document Reviewed: 01/18/2017 Elsevier Interactive Patient Education  [employer].

## 2018-08-10 NOTE — Telephone Encounter (Signed)
Copied from [place] [id number]. Topic: General - Other >> Aug 10, 2018 10:23 AM Yvette Rack wrote: Reason for CRM: Pt states she can not afford the medication that was just prescribed and she would need an alternate prescription sent to her pharmacy. Pt requests a call back. Cb# [id number]

## 2018-08-10 NOTE — Progress Notes (Signed)
Subjective:    Patient ID: Valerie Gordon, female    DOB: [birth year], 67 y.o.   MRN: [id number]  HPI  Valerie Gordon is a 67 year old female who presents today to discuss numerous topics.  1) Facial Swelling: Evaluated on 08/02/18 with reports of sudden onset of right sided facial swelling when waking with some visual blurring. Her facial swelling resolved by the time of her visit, but also endorsed right ear fullness. Admitted that she hadn't seen a dentist in years.   She also made note of vaginal itching. She saw GYN who noted atrophy and was prescribed Premarin cream. Last A1C was >10, no recent diabetes follow up.  Since her visit her facial swelling has improved. She continues to notice vaginal itching.   2) Type 2 Diabetes: No recent follow up with diabetes despite recommendations. Today she admits that she hasn't felt like taking care of herself over the last one year. She has a history of 100 pound weight loss in the past (2006) and was able to come off of all medications. She wants to get back to that place again.   Current medications include: Glipizide XL 10 mg daily, but does not take routinely. She will miss 1 week at a time.   She is checking her blood glucose 2-3 times daily, started last week, and is getting readings of:  AM fasting: low-mid 300's.  Sporadic testing: mid 200's.   Last A1C: 11.7 in August 2019, 10.6 in October 2018. Last Eye Exam: Due in September 2019 Last Foot Exam: Due Pneumonia Vaccination: Completed in 2017 ACE/ARB: None, urine microalbumin pending Statin: None, lipid panel pending.  Diet currently consists of: Just changed her diet last week, eating little to no carbs.   Prior diet: Breakfast: Eggs, bacon, sausage; occasionally yogurt with fruit; cereal  Lunch: Sandwich Dinner: Meat, potatoes Snacks: Chips Desserts: Cakes, daily Beverages: Coffee, water, wine  Exercise: She recently joined the [employer] with [employer], she will start  swimming and lifting weights.    Review of Systems  Eyes:       Intermittent visual disturbance   Respiratory: Negative for shortness of breath.   Cardiovascular: Negative for chest pain.  Endocrine: Positive for polyuria.  Genitourinary:       Intermittent vaginal itching  Neurological: Negative for dizziness and headaches.       Past Medical History:  Diagnosis Date  . Cataract   . Depression   . GERD (gastroesophageal reflux disease)   . Glaucoma   . Thyroid disease   . Type 2 diabetes mellitus ([place])    manages with diet and exercise     Social History   Socioeconomic History  . Marital status: Widowed    Spouse name: Not on file  . Number of children: Not on file  . Years of education: Not on file  . Highest education level: Not on file  Occupational History  . Not on file  Social Needs  . Financial resource strain: Not on file  . Food insecurity:    Worry: Not on file    Inability: Not on file  . Transportation needs:    Medical: Not on file    Non-medical: Not on file  Tobacco Use  . Smoking status: Never Smoker  . Smokeless tobacco: Never Used  Substance and Sexual Activity  . Alcohol use: Yes  . Drug use: No  . Sexual activity: Yes  Lifestyle  . Physical activity:    Days per  week: 0 days    Minutes per session: Not on file  . Stress: Not on file  Relationships  . Social connections:    Talks on phone: Not on file    Gets together: Not on file    Attends religious service: Not on file    Active member of club or organization: Not on file    Attends meetings of clubs or organizations: Not on file    Relationship status: Not on file  . Intimate partner violence:    Fear of current or ex partner: Not on file    Emotionally abused: Not on file    Physically abused: Not on file    Forced sexual activity: Not on file  Other Topics Concern  . Not on file  Social History Narrative   Widower.   Step children.   Environmental health practitioner.    Enjoys riding hot air balloons, spending time with friends, reading, jewelry    Past Surgical History:  Procedure Laterality Date  . BREAST BIOPSY  2010  . CARDIAC CATHETERIZATION  1998  . COLONOSCOPY  10-30-2004  . COLONOSCOPY WITH PROPOFOL N/A 04/01/2016   Procedure: COLONOSCOPY WITH PROPOFOL;  Surgeon: Robert Bellow, MD;  Location: [facility] ENDOSCOPY;  Service: Endoscopy;  Laterality: N/A;  . EYE SURGERY Bilateral    cataract sx  . TONSILLECTOMY      Family History  Problem Relation Age of Onset  . Stroke Mother   . Heart disease Father     Allergies  Allergen Reactions  . Antihistamines, Chlorpheniramine-Type   . Ciprocin-Fluocin-Procin [Fluocinolone]   . Metformin And Related     Jittery, diarrhea    Current Outpatient Medications on File Prior to Visit  Medication Sig Dispense Refill  . latanoprost (XALATAN) 0.005 % ophthalmic solution 1 drop at bedtime.    Marland Kitchen levothyroxine (SYNTHROID, LEVOTHROID) 100 MCG tablet Take 1 tablet by mouth every morning on an empty stomach with a full glass of water. 90 tablet 3  . glipiZIDE (GLIPIZIDE XL) 10 MG 24 hr tablet Take 1 tablet (10 mg total) by mouth daily with breakfast. (Patient not taking: Reported on 08/10/2018) 90 tablet 3  . triamcinolone ointment (KENALOG) 0.5 % Apply 1 application topically 2 (two) times daily. (Patient not taking: Reported on 08/10/2018) 30 g 0   No current facility-administered medications on file prior to visit.     BP 120/78   Pulse 82   Temp 98.4 F (36.9 C) (Oral)   Ht 5\' 1"  (1.549 m)   Wt 211 lb 4 oz (95.8 kg)   SpO2 98%   BMI 39.92 kg/m    Objective:   Physical Exam  Constitutional: She appears well-nourished.  Neck: Neck supple.  Cardiovascular: Normal rate and regular rhythm.  Respiratory: Effort normal and breath sounds normal.  Skin: Skin is warm and dry.           Assessment & Plan:

## 2018-08-10 NOTE — Telephone Encounter (Signed)
Noted, will send patient my chart message. 

## 2018-08-10 NOTE — Assessment & Plan Note (Addendum)
No follow up since November 2018 despite recommendations, not taking Glipizide consistently, not exercising, poor diet.  She is now motivated to change her lifestyle, has already changed her diet and is exercising. Given significantly elevated A1C, recommended low dose Lantus HS in combination with Glipizide, diet, and exercise. RX for Lantus Pen and pen needles sent to pharmacy, start at 10 units. Discussed to monitor glucose at various times of the day.  Urine microalbumin pending today. Declines lipid panel today, she would like this delayed.  Pneumonia vaccination UTD. Foot exam today. Eye exam UTD.  Follow up in 1 month with glucose logs.

## 2018-08-22 DIAGNOSIS — E119 Type 2 diabetes mellitus without complications: Secondary | ICD-10-CM

## 2018-08-23 MED ORDER — INSULIN GLARGINE 100 UNIT/ML SOLOSTAR PEN: PEN_INJECTOR | SUBCUTANEOUS | 11 refills | Status: DC

## 2018-09-06 ENCOUNTER — Ambulatory Visit (INDEPENDENT_AMBULATORY_CARE_PROVIDER_SITE_OTHER): Payer: Medicare HMO | Admitting: Family Medicine

## 2018-09-06 ENCOUNTER — Encounter: Payer: Self-pay | Admitting: Family Medicine

## 2018-09-06 DIAGNOSIS — E119 Type 2 diabetes mellitus without complications: Secondary | ICD-10-CM | POA: Diagnosis not present

## 2018-09-06 NOTE — Progress Notes (Signed)
Medical Nutrition Therapy:  Appt start time: 1000 end time:  1100. PCP Alma Friendly, PA  Assessment:  Primary concerns today: Blood sugar control.   Valerie Gordon weighed <100 lb until she was in her 28s.  Since then she has cycled up and down 100 lb.  She has always used a carb-restricted diet (Atkins) each time, as well as doing a lot of exercise (swimming).  She is frustrated that weight loss is harder now than it used to be.    Looking for a keto-type diet, Valerie Gordon started the [employer] Valerie Gordon) immediately after seeing PA Alma Friendly 08-10-18.  Following the Bulletproof meal plan (Bproof coffee in AM, then lunch and dinner of lean protein and non-starchy, non-nightshade veg's) for 2 weeks, BG fell to normal range, and she lost 3-4 lb per week, but in the past 10 days or so her BG has been erratic, including FBG usually in the 200s.     FBG 226 today.  Previous FBG were: 243, 253, 233, 264, 202, 171, 175, 136, 233.  Hypoglycemic events: one event ~10 days ago, and 3-4 other times since.  BG was ~80 each time, but symptoms were pretty severe.    Valerie Gordon has been inconsistent in the past couple of years in terms of self-care, including when/if she takes her medicine. She doesn't understand why this has happened, although believes part of it is that she has been unable to manage her weight now, which has led to extreme frustration and giving up.  When Valerie Gordon has followed an Atkins diet, she has included small amts of low-glycemic fruits, nightshade veg's, and dairy foods.  I suspect this may be more sustainable for her.    Learning Readiness: Ready  Usual eating pattern: 0 meals and 5 snacks per day. Frequent foods and beverages: Highly refined coconut oil (Bulletproof brand), 24 oz Bulletproof coffee (with 3 tbsp Ghee & 1/5 tbsp coconut oil, & 2 scoops ~3 tbsp collagen protein), unswt tea, water; steamed veg's, lean protein, eggs, bacon.  Avoided foods: starchy foods, sweets, highly processed  foods, nightshades, fruit, dairy foods.   Usual physical activity: none currently; worried re. low blood glucose recently. Sleep: Estimates she gets at least 7 hrs sleep per night.    24-hr recall: (Up at 6:30 AM) B (6:45 AM)-   3 c Bulletproof coffee, 3 tbsp Ghee & 1/5 tbsp coconut oil, & 2 scoops ~3 tbsp collagen protein Snk ( AM)-   --- L (11 PM)-  2-3 tbsp homemade chx salad (mayo, celery), water  Snk (1 PM)-  2-3 tbsp homemade chx salad (mayo, celery), water Snk (3 PM)-  2-3 tbsp homemade chx salad (mayo, celery), water D (6 PM)-  2 c shrimp in butter, 2 c broc, cauliflr, & grn beans, unswt tea Snk (7:30)-  1 glass dry Zinfandel wine Snk (9 PM)-  1 square dark choc Typical day? Yes.    Progress Towards Goal(s):  In progress.   Nutritional Diagnosis:  NB-2.1 Physical inactivity As related to fears re. hypoglycemic.  As evidenced by no physical activity currently.    Intervention:  Nutrition education Handouts given during visit include:  After-Visit Summary (AVS)  BG form  Demonstrated degree of understanding via:  Teach Back  Barriers to learning/adherence to lifestyle change: Beliefs that only pretty extreme carb restriction will help her manage both blood glucose and weight.  Monitoring/Evaluation:  Dietary intake, exercise, BG levels, and body weight prn.

## 2018-09-06 NOTE — Patient Instructions (Addendum)
If you want to start exercising with regularity (a great idea), you will probably benefit by adding in some complex carbohydrate to your diet.  The best foods for this (slow-burning carb's) include WHOLE grains, sweet potato, winter squash (acorn, butternut, etc.), beets.    Before you exercise, getting some starch is smart.  Post-exercise you may want to consume a small amount of both carb and protein.    In addition to EXERCISE, intermittent moving through the day is really important to metabolic health!  Important concept:  The best diet does not reside in the world of ALL or NONE.  Consider what has worked well for you in the past as well as what you believe is going to be sustainable.   - Email [name]:  What exactly is the nutritional content of all the ingredients in your Bulletproof coffee?  Let me know the specific nutritional content of EACH ingredient in the amounts you consume.    [name] book: Always Hungry.    Specific GOALS: 1. Continue to spread food out through the day.  An example of a good eating plan is 3 real meals and 2-3 snacks during the day.   2. Incorporate at least some carb (high-fiber/low-glycemic).  Remember that when you eat starch that is accompanied by fiber, that fiber helps reduce the glycemic response.   3. Physical activity: Swim & do weights at least 2 X wk; use elliptical at least 5-10 minutes at least 3 times a day on MWF.    Record BG levels on form provided today.  Bring to follow-up appt, time TBA.

## 2018-09-23 ENCOUNTER — Ambulatory Visit: Payer: Medicare HMO | Admitting: Family Medicine

## 2018-10-31 ENCOUNTER — Ambulatory Visit: Payer: Medicare HMO | Admitting: Family Medicine

## 2018-11-22 DIAGNOSIS — L821 Other seborrheic keratosis: Secondary | ICD-10-CM | POA: Diagnosis not present

## 2018-11-22 DIAGNOSIS — L570 Actinic keratosis: Secondary | ICD-10-CM | POA: Diagnosis not present

## 2018-11-22 DIAGNOSIS — L280 Lichen simplex chronicus: Secondary | ICD-10-CM | POA: Diagnosis not present

## 2018-11-22 DIAGNOSIS — L578 Other skin changes due to chronic exposure to nonionizing radiation: Secondary | ICD-10-CM | POA: Diagnosis not present

## 2018-11-22 DIAGNOSIS — L720 Epidermal cyst: Secondary | ICD-10-CM | POA: Diagnosis not present

## 2018-11-22 DIAGNOSIS — I781 Nevus, non-neoplastic: Secondary | ICD-10-CM | POA: Diagnosis not present

## 2018-11-22 DIAGNOSIS — B372 Candidiasis of skin and nail: Secondary | ICD-10-CM | POA: Diagnosis not present

## 2018-11-22 DIAGNOSIS — Z1283 Encounter for screening for malignant neoplasm of skin: Secondary | ICD-10-CM | POA: Diagnosis not present

## 2018-11-22 DIAGNOSIS — L918 Other hypertrophic disorders of the skin: Secondary | ICD-10-CM | POA: Diagnosis not present

## 2018-11-29 ENCOUNTER — Ambulatory Visit (INDEPENDENT_AMBULATORY_CARE_PROVIDER_SITE_OTHER): Payer: Medicare HMO | Admitting: Family Medicine

## 2018-11-29 ENCOUNTER — Encounter: Payer: Self-pay | Admitting: Family Medicine

## 2018-11-29 DIAGNOSIS — E785 Hyperlipidemia, unspecified: Secondary | ICD-10-CM

## 2018-11-29 DIAGNOSIS — E039 Hypothyroidism, unspecified: Secondary | ICD-10-CM

## 2018-11-29 DIAGNOSIS — E119 Type 2 diabetes mellitus without complications: Secondary | ICD-10-CM | POA: Diagnosis not present

## 2018-11-29 NOTE — Progress Notes (Signed)
Medical Nutrition Therapy:  Appt start time: 1000 end time:  1100. PCP Alma Friendly, PA  Assessment:  Primary concerns today: Blood sugar control.   Valerie Gordon stopped taking her diabetes med's on 11/22, having had hypoglycemic episodes during the first couple days of doing a new keto approach.  She has had no other hypoglycemic events since stopping the glypizide.  She discovered a website called SimpleKeto, which suggested to her that her high protein intake may have contributed to her continued high glucose levels.  (This is sound information.)    The SimpleKeto site recommends ~1000 kcal/day limit with a small amt of protein, large amt of greens and fat - for up to 5 days at a time.  FBG the day she started the diet was 247, and BG dropped to 111 at bedtime.  Fasting the next day was 185, and continued to decline over the next few days.  She has been alternating 3 days of this regimen with 3 days of a higher-protein keto diet (and more carb over Thanksgiving).  Valerie Gordon has not felt hungry on this regimen.    FBG have been 136-185 (185, 162, 152, 136, 145, 143, 169, 154, 175, 158, 148, 167).  Valerie Gordon is consistently checking and recording both FBG and bedtime glucose.   Valerie Gordon inquired about following the lower-protein, higher-fat keto diet 5 days a week, with adding more protein on weekends.  While BG has responded well to this approach, it provides only ~35 g of protein per day, which is why I recommended she not follow it for more than 4 days a week.    I also strongly encouraged Valerie Gordon add consistent exercise to her weekly routine, which may help achieve some weight loss that has so far not happened, and is frustrating to her.    Learning Readiness: Ready  Usual eating pattern: 3 meals and 0 snacks per day. Daily foods / beverages: 24 oz Bulletproof coffee (with 1 tbsp Ghee & 1 tbsp coconut oil (Bulletproof brand), & 1 scoop ~2 tbsp collagen protein). Usual physical activity: none currently; [facility] b/c of concerns of driving in the dark.  Has used the elliptical at home sporadically.  .    24-hr recall:  (Up at 6 AM) B (6 AM)-  1 scoop protein (11 g), 1 tbsp coconut oil (14 g fat), 1 tbsp butter (12 g fat), coffee 275 kcal Snk ( AM)-  --- L (12 PM)-  2 c Beef (1 oz) & broccoli soup (w/ 1 tbsp each butter & crm chs), water  280 Snk ( PM)-  --- D (7 PM)-  2 c spinach, 6 shrimp, 1 tbsp butter, garlic, 4 oz Zinfandel, water   337  Snk ( PM)-  --- Typical day? Yes.    Progress Towards Goal(s):  In progress.   Nutritional Diagnosis:  NB-2.1 Physical inactivity As related to fears re. hypoglycemic.  As evidenced by no physical activity currently.    Intervention:  Nutrition education Handouts given during visit include:  After-Visit Summary (AVS)  Demonstrated degree of understanding via:  Teach Back  Barriers to learning/adherence to lifestyle change: Beliefs that only pretty extreme carb restriction will help her manage both blood glucose and weight.  Monitoring/Evaluation:  Dietary intake, exercise, BG levels, and body weight January 2020.

## 2018-11-29 NOTE — Patient Instructions (Addendum)
Goals: 1. Physical activity:   - Contact Van Clines, Personal Trainer for a program you can do at home: [email or web address]; [phone].  - 15 minutes elliptical Monday through Friday.  In addition, 10-15 minutes yoga stretching and minimal calisthenics after work at least 3 days a week.    - Move intermitently during the day - at least once an hour.    - Record # of minutes you exercise per day.    2. Diet:   - Continue using the simple keto plan Monday through Thursday, and add in more protein and less fat Friday (e.g., 4-6 oz meat/fish with unlimited greens/veg's) through Sunday.    - Keep careful daily blood glucose records.  If you find a glucose level that is especially high or low, immediately write down what you ate the day before, how much, and what time.   - Follow-up January 20 at 10 AM.

## 2018-12-09 ENCOUNTER — Other Ambulatory Visit: Payer: Medicare HMO

## 2018-12-09 ENCOUNTER — Telehealth: Payer: Self-pay

## 2018-12-09 NOTE — Telephone Encounter (Signed)
Please address statin therapy for the patient. Thank you

## 2018-12-12 NOTE — Telephone Encounter (Signed)
Patient has declined statin therapy in the past. Raquel Sarna, please notify patient that she is overdue for diabetes follow-up.  Please schedule her for the end of the year at her convenience.

## 2018-12-14 ENCOUNTER — Ambulatory Visit: Payer: Medicare HMO | Admitting: Primary Care

## 2019-01-12 ENCOUNTER — Other Ambulatory Visit (INDEPENDENT_AMBULATORY_CARE_PROVIDER_SITE_OTHER): Payer: Medicare HMO

## 2019-01-12 DIAGNOSIS — E785 Hyperlipidemia, unspecified: Secondary | ICD-10-CM

## 2019-01-12 DIAGNOSIS — E119 Type 2 diabetes mellitus without complications: Secondary | ICD-10-CM

## 2019-01-12 DIAGNOSIS — E039 Hypothyroidism, unspecified: Secondary | ICD-10-CM | POA: Diagnosis not present

## 2019-01-12 LAB — COMPREHENSIVE METABOLIC PANEL
ALBUMIN: 4.1 g/dL (ref 3.5–5.2)
ALK PHOS: 72 U/L (ref 39–117)
ALT: 13 U/L (ref 0–35)
AST: 25 U/L (ref 0–37)
BUN: 12 mg/dL (ref 6–23)
CO2: 28 mEq/L (ref 19–32)
Calcium: 10.1 mg/dL (ref 8.4–10.5)
Chloride: 100 mEq/L (ref 96–112)
Creatinine, Ser: 0.9 mg/dL (ref 0.40–1.20)
GFR: 66.68 mL/min (ref >60.00)
Glucose, Bld: 226 mg/dL — ABNORMAL HIGH (ref 70–99)
Potassium: 4.2 mEq/L (ref 3.5–5.1)
SODIUM: 134 meq/L — AB (ref 135–145)
Total Bilirubin: 0.4 mg/dL (ref 0.2–1.2)
Total Protein: 7.3 g/dL (ref 6.0–8.3)

## 2019-01-12 LAB — LIPID PANEL
Cholesterol: 218 mg/dL — ABNORMAL HIGH (ref 0–200)
HDL: 55.3 mg/dL (ref >39.00)
LDL Cholesterol: 131 mg/dL — ABNORMAL HIGH (ref 0–99)
NonHDL: 162.43
Total CHOL/HDL Ratio: 4
Triglycerides: 156 mg/dL — ABNORMAL HIGH (ref 0.0–149.0)
VLDL: 31.2 mg/dL (ref 0.0–40.0)

## 2019-01-12 LAB — TSH: TSH: 3.52 u[IU]/mL (ref 0.35–4.50)

## 2019-01-12 LAB — HEMOGLOBIN A1C: HEMOGLOBIN A1C: 7.7 % — AB (ref 4.6–6.5)

## 2019-01-16 ENCOUNTER — Ambulatory Visit: Payer: Medicare HMO | Admitting: Family Medicine

## 2019-01-18 ENCOUNTER — Encounter: Payer: Self-pay | Admitting: Primary Care

## 2019-01-18 ENCOUNTER — Ambulatory Visit (INDEPENDENT_AMBULATORY_CARE_PROVIDER_SITE_OTHER): Payer: Medicare HMO | Admitting: Primary Care

## 2019-01-18 VITALS — BP 120/78 | HR 68 | Temp 98.0°F | Ht 61.0 in | Wt 209.5 lb

## 2019-01-18 DIAGNOSIS — E785 Hyperlipidemia, unspecified: Secondary | ICD-10-CM | POA: Diagnosis not present

## 2019-01-18 DIAGNOSIS — E119 Type 2 diabetes mellitus without complications: Secondary | ICD-10-CM | POA: Diagnosis not present

## 2019-01-18 DIAGNOSIS — E039 Hypothyroidism, unspecified: Secondary | ICD-10-CM | POA: Diagnosis not present

## 2019-01-18 DIAGNOSIS — Z23 Encounter for immunization: Secondary | ICD-10-CM

## 2019-01-18 MED ORDER — DULAGLUTIDE 0.75 MG/0.5ML ~~LOC~~ SOAJ: SUBCUTANEOUS | 2 refills | Status: DC

## 2019-01-18 NOTE — Patient Instructions (Addendum)
Start dulaglutide (Trulicity) 7.06 mg. Inject into the skin once weekly.  Continue to monitor your blood sugars.  Continue to work hard on your diet.  Start exercising. You should be getting 150 minutes of moderate intensity exercise weekly.  Please consider the rosuvastatin (Crestor) or atorvastatin (Lipitor) medications for cholesterol and protection against heart disease/stroke.  Please schedule a follow up appointment in 3 months for diabetes check  It was a pleasure to see you today! Marland Kitchen

## 2019-01-18 NOTE — Assessment & Plan Note (Signed)
LDL above goal, not currently on statin therapy. ASCVD risk score of 9%.  Discussed risk for cardiovascular disease given diabetes and elevated LDL. She declined statin therapy and would like to think about this.  Strong recommendations provided.

## 2019-01-18 NOTE — Assessment & Plan Note (Signed)
Recent TSH unremarkable.  Continue levothyroxine 100 mcg daily.

## 2019-01-18 NOTE — Assessment & Plan Note (Signed)
Recent A1C improved from 11.7 to 7.7 with dietary changes alone, commended her on these efforts. Would like to see her at 7.0 or below, she agrees. We discussed different treatment options and decided to move forward with Trulicity once weekly.  We had a long discussion about statin use given history of diabetes to prevent cardiovascular disease.  She declines today and will think about treatment.  Strong recommendations were provided.  Urine microalbumin negative in August 2019, repeat August 2020. She will schedule an eye exam soon. Foot exam up-to-date, pneumonia vaccination up-to-date.  We will plan to see her back in 3 months for repeat A1c and follow-up on diabetes.  She will message sooner if her glucose readings remain elevated above 200.

## 2019-01-18 NOTE — Progress Notes (Signed)
Subjective:    Patient ID: Valerie Gordon, female    DOB: [birth year], 68 y.o.   MRN: [id number]  HPI  [name] is a 68 year old female who presents today for follow up of diabetes.  Current medications include: Glipizide ER 10 mg, Lantus 8 units HS. She stopped both medications around mid October 2019 as her blood sugars were "crashing".   She is checking her blood glucose 2 times daily and is getting readings of: AM fasting: low 200's mostly, 150-160 with an early dinner and no evening snacks.  Bedtime: 110's   Last A1C: 11.7 in August, 7.7 on recent labs Last Eye Exam: Due now, she will schedule Last Foot Exam: Completed in August 2019 Pneumonia Vaccination: Completed in 2017 ACE/ARB: Urine microalbumin negative in August 2019 Statin: None. LDL of 131 on recent labs.  Diet currently consists of: She started working with a nutritionist in late August 2019. She is doing low protein, higher vegetable diet. She is also following with the nutritionist.   Breakfast: Coffee with protein Lunch: Meat, vegetables  Dinner: Meat, vegetables  Snacks: None Desserts: None Beverages: Wine nightly, water, coffee  Exercise: She is not exercising   The 10-year ASCVD risk score Valerie Gordon DC Jr., et al., 2013) is: 9.7%   Values used to calculate the score:     Age: 60 years     Sex: Female     Is Non-Hispanic African American: No     Diabetic: Yes     Tobacco smoker: No     Systolic Blood Pressure: 409 mmHg     Is BP treated: No     HDL Cholesterol: 55.3 mg/dL     Total Cholesterol: 218 mg/dL  BP Readings from Last 3 Encounters:  01/18/19 120/78  08/10/18 120/78  08/02/18 132/74     Review of Systems  Eyes: Negative for visual disturbance.  Respiratory: Negative for shortness of breath.   Cardiovascular: Negative for chest pain.  Neurological: Negative for dizziness and numbness.     Past Medical History:  Diagnosis Date  . Cataract   . Depression   . GERD  (gastroesophageal reflux disease)   . Glaucoma   . Thyroid disease   . Type 2 diabetes mellitus ([place])    manages with diet and exercise     Social History   Socioeconomic History  . Marital status: Widowed    Spouse name: Not on file  . Number of children: Not on file  . Years of education: Not on file  . Highest education level: Not on file  Occupational History  . Not on file  Social Needs  . Financial resource strain: Not on file  . Food insecurity:    Worry: Not on file    Inability: Not on file  . Transportation needs:    Medical: Not on file    Non-medical: Not on file  Tobacco Use  . Smoking status: Never Smoker  . Smokeless tobacco: Never Used  Substance and Sexual Activity  . Alcohol use: Yes  . Drug use: No  . Sexual activity: Yes  Lifestyle  . Physical activity:    Days per week: 0 days    Minutes per session: Not on file  . Stress: Not on file  Relationships  . Social connections:    Talks on phone: Not on file    Gets together: Not on file    Attends religious service: Not on file    Active member of  club or organization: Not on file    Attends meetings of clubs or organizations: Not on file    Relationship status: Not on file  . Intimate partner violence:    Fear of current or ex partner: Not on file    Emotionally abused: Not on file    Physically abused: Not on file    Forced sexual activity: Not on file  Other Topics Concern  . Not on file  Social History Narrative   Widower.   Step children.   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 BREAST BIOPSY  2010  . CARDIAC CATHETERIZATION  1998  . COLONOSCOPY  10-30-2004  . COLONOSCOPY WITH PROPOFOL N/A 04/01/2016   Procedure: COLONOSCOPY WITH PROPOFOL;  Surgeon: Valerie Bellow, MD;  Location: [facility] ENDOSCOPY;  Service: Endoscopy;  Laterality: N/A;  . EYE SURGERY  Bilateral    cataract sx  . TONSILLECTOMY      Family History  Problem Relation Age of Onset  . Stroke Mother   . Heart disease Father     Allergies  Allergen Reactions  . Antihistamines, Chlorpheniramine-Type   . Ciprocin-Fluocin-Procin [Fluocinolone]   . Metformin And Related     Jittery, diarrhea    Current Outpatient Medications on File Prior to Visit  Medication Sig Dispense Refill  . b complex vitamins tablet Take 1 tablet by mouth daily.    Marland Kitchen BIOTIN PO Take 5 mg by mouth.    . Cholecalciferol (D3 HIGH POTENCY) 2000 units CAPS Take by mouth.    . Insulin Pen Needle (PEN NEEDLES) 31G X 6 MM MISC Use nightly with insulin. 100 each 0  . Krill Oil 300 MG CAPS Take by mouth.    . latanoprost (XALATAN) 0.005 % ophthalmic solution 1 drop at bedtime.    Marland Kitchen levothyroxine (SYNTHROID, LEVOTHROID) 100 MCG tablet Take 1 tablet by mouth every morning on an empty stomach with a full glass of water. 90 tablet 3  . Magnesium 200 MG TABS Take by mouth.    . Menaquinone-7 (VITAMIN K2 PO) Take 19 mcg by mouth.    Marland Kitchen VITAMIN A PO Take 2,400 mcg by mouth.     No current facility-administered medications on file prior to visit.     BP 120/78   Pulse 68   Temp 98 F (36.7 C) (Oral)   Ht 5\' 1"  (1.549 m)   Wt 209 lb 8 oz (95 kg)   SpO2 98%   BMI 39.58 kg/m    Objective:   Physical Exam  Constitutional: She appears well-nourished.  Neck: Neck supple.  Cardiovascular: Normal rate and regular rhythm.  Respiratory: Effort normal and breath sounds normal.  Skin: Skin is warm and dry.           Assessment & Plan:

## 2019-01-23 ENCOUNTER — Other Ambulatory Visit: Payer: Self-pay | Admitting: Primary Care

## 2019-01-23 DIAGNOSIS — E039 Hypothyroidism, unspecified: Secondary | ICD-10-CM

## 2019-02-07 ENCOUNTER — Ambulatory Visit: Payer: Self-pay | Admitting: Internal Medicine

## 2019-04-13 ENCOUNTER — Telehealth: Payer: Self-pay | Admitting: Primary Care

## 2019-04-13 DIAGNOSIS — E119 Type 2 diabetes mellitus without complications: Secondary | ICD-10-CM

## 2019-04-13 NOTE — Telephone Encounter (Signed)
Last prescribed on 01/18/2019. Last office visit on 01/18/2019. No future appointment

## 2019-04-13 NOTE — Telephone Encounter (Signed)
Patient needs a follow-up for diabetes, please schedule for as soon as possible. Also has she been injecting the Trulicity once weekly since her last visit in January? We will discuss further during our visit.

## 2019-04-14 ENCOUNTER — Other Ambulatory Visit (INDEPENDENT_AMBULATORY_CARE_PROVIDER_SITE_OTHER): Payer: Self-pay

## 2019-04-14 ENCOUNTER — Other Ambulatory Visit: Payer: Self-pay

## 2019-04-14 DIAGNOSIS — E119 Type 2 diabetes mellitus without complications: Secondary | ICD-10-CM

## 2019-04-14 LAB — POCT GLYCOSYLATED HEMOGLOBIN (HGB A1C): Hemoglobin A1C: 6.5 % — AB (ref 4.0–5.6)

## 2019-04-14 NOTE — Telephone Encounter (Signed)
Patient returned call from office.  Advised her of message and scheduled follow up visit for Monday 04/17/2019 @ 11:40   Patient stated that she has been injecting Trulicity once weekly and this has been working great

## 2019-04-14 NOTE — Telephone Encounter (Signed)
Message left for patient to return my call.  

## 2019-04-17 ENCOUNTER — Encounter: Payer: Self-pay | Admitting: Primary Care

## 2019-04-17 ENCOUNTER — Ambulatory Visit (INDEPENDENT_AMBULATORY_CARE_PROVIDER_SITE_OTHER): Payer: Self-pay | Admitting: Primary Care

## 2019-04-17 DIAGNOSIS — E119 Type 2 diabetes mellitus without complications: Secondary | ICD-10-CM

## 2019-04-17 DIAGNOSIS — E785 Hyperlipidemia, unspecified: Secondary | ICD-10-CM

## 2019-04-17 MED ORDER — DULAGLUTIDE 0.75 MG/0.5ML ~~LOC~~ SOAJ: SUBCUTANEOUS | 5 refills | Status: DC

## 2019-04-17 MED ORDER — ROSUVASTATIN CALCIUM 10 MG PO TABS: 10.0000 mg | ORAL_TABLET | Freq: Every day | ORAL | 3 refills | Status: DC

## 2019-04-17 NOTE — Progress Notes (Signed)
Subjective:    Patient ID: Valerie Gordon, female    DOB: 05-10-1951, 68 y.o.   MRN: [id number]  HPI  Virtual Visit via Video Note  I connected with Valerie Gordon on 04/17/19 at 11:40 AM EDT by a video enabled telemedicine application and verified that I am speaking with the correct person using two identifiers.   I discussed the limitations of evaluation and management by telemedicine and the availability of in person appointments. The patient expressed understanding and agreed to proceed. She is at home, I am in the office.  History of Present Illness:  Valerie Gordon is a 68 year old female who presents today for follow up of diabetes.  Current medications include: Trulicity 9.02 mg weekly. She denies GI upset, nausea. She does have some itching at the injection site which is mild overall.  She is checking her blood glucose 1 times daily and is getting readings of: AM fasting: 120's-140's   Last A1C: 7.7 in January 2020, 6.5 in April 2020 Last Eye Exam: Last Foot Exam: Due in August 2020 Pneumonia Vaccination: Completed in 2017 ACE/ARB: Urine microalbumin due in August 2020 Statin: Declined in the past, agreeable now.   Diet currently consists of: She is on a Keto mostly diet  Breakfast: Skips Lunch: Salad, tuna/chicken salad Dinner: Protein, potatoes, vegetables Snacks: None Desserts: Occasional carrot cake squares Beverages: Coffee, wine, water   Exercise: She is not exercising  The 10-year ASCVD risk score Valerie Bussing DC Jr., et al., 2013) is: 9.7%   Values used to calculate the score:     Age: 89 years     Sex: Female     Is Non-Hispanic African American: No     Diabetic: Yes     Tobacco smoker: No     Systolic Blood Pressure: 409 mmHg     Is BP treated: No     HDL Cholesterol: 55.3 mg/dL     Total Cholesterol: 218 mg/dL     Observations/Objective:  Appears well. No distress. Alert and oriented.  Assessment and Plan:  See problem based charting  Follow  Up Instructions:  Continue Trulicity 7.35 mg once weekly for diabetes.  Continue to work on [employer].  Start exercising. You should be getting 150 minutes of exercise weekly.  We will contact you regarding a lab only appointment for 6 weeks for cholesterol and liver check and a follow up visit for 6 months for diabetes check.  It was a pleasure to see you today!    I discussed the assessment and treatment plan with the patient. The patient was provided an opportunity to ask questions and all were answered. The patient agreed with the plan and demonstrated an understanding of the instructions.   The patient was advised to call back or seek an in-person evaluation if the symptoms worsen or if the condition fails to improve as anticipated.     Pleas Koch, NP    Review of Systems  Constitutional: Negative for fever.  Gastrointestinal: Negative for abdominal pain and nausea.  Skin:       Itching at injection site from Trulicity, no erythema or swelling  Neurological: Negative for headaches.       Past Medical History:  Diagnosis Date  . Cataract   . Depression   . GERD (gastroesophageal reflux disease)   . Glaucoma   . Thyroid disease   . Type 2 diabetes mellitus ([place])    manages with diet and exercise  Social History   Socioeconomic History  . Marital status: Widowed    Spouse name: Not on file  . Number of children: Not on file  . Years of education: Not on file  . Highest education level: Not on file  Occupational History  . Not on file  Social Needs  . Financial resource strain: Not on file  . Food insecurity:    Worry: Not on file    Inability: Not on file  . Transportation needs:    Medical: Not on file    Non-medical: Not on file  Tobacco Use  . Smoking status: Never Smoker  . Smokeless tobacco: Never Used  Substance and Sexual Activity  . Alcohol use: Yes  . Drug use: No  . Sexual activity: Yes  Lifestyle  . Physical activity:     Days per week: 0 days    Minutes per session: Not on file  . Stress: Not on file  Relationships  . Social connections:    Talks on phone: Not on file    Gets together: Not on file    Attends religious service: Not on file    Active member of club or organization: Not on file    Attends meetings of clubs or organizations: Not on file    Relationship status: Not on file  . Intimate partner violence:    Fear of current or ex partner: Not on file    Emotionally abused: Not on file    Physically abused: Not on file    Forced sexual activity: Not on file  Other Topics Concern  . Not on file  Social History Narrative   Widower.   Step children.   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 BREAST BIOPSY  2010  . CARDIAC CATHETERIZATION  1998  . COLONOSCOPY  10-30-2004  . COLONOSCOPY WITH PROPOFOL N/A 04/01/2016   Procedure: COLONOSCOPY WITH PROPOFOL;  Surgeon: Robert Bellow, MD;  Location: [facility] ENDOSCOPY;  Service: Endoscopy;  Laterality: N/A;  . EYE SURGERY Bilateral    cataract sx  . TONSILLECTOMY      Family History  Problem Relation Age of Onset  . Stroke Mother   . Heart disease Father     Allergies  Allergen Reactions  . Antihistamines, Chlorpheniramine-Type   . Ciprocin-Fluocin-Procin [Fluocinolone]   . Metformin And Related     Jittery, diarrhea    Current Outpatient Medications on File Prior to Visit  Medication Sig Dispense Refill  . b complex vitamins tablet Take 1 tablet by mouth daily.    Marland Kitchen BIOTIN PO Take 5 mg by mouth.    . Cholecalciferol (D3 HIGH POTENCY) 2000 units CAPS Take by mouth.    . Dulaglutide (TRULICITY) 6.27 OJ/5.0KX SOPN Inject once weekly as directed. 2 mL 2  . Insulin Pen Needle (PEN NEEDLES) 31G X 6 MM MISC Use nightly with insulin. 100 each 0  . Krill Oil 300 MG CAPS Take by mouth.    . latanoprost (XALATAN)  0.005 % ophthalmic solution 1 drop at bedtime.    Marland Kitchen levothyroxine (SYNTHROID, LEVOTHROID) 100 MCG tablet TAKE 1 TABLET BY MOUTH EVERY MORNING ON AN EMPTY STOMACH AND FULL GLASS OF WATER 90 tablet 2  . Magnesium 200 MG TABS Take by mouth.    . Menaquinone-7 (VITAMIN K2 PO) Take 19 mcg by mouth.    Marland Kitchen VITAMIN A  PO Take 2,400 mcg by mouth.     No current facility-administered medications on file prior to visit.     There were no vitals taken for this visit.   Objective:   Physical Exam  Constitutional: She is oriented to person, place, and time. She appears well-nourished.  Respiratory: Effort normal. No respiratory distress.  Neurological: She is alert and oriented to person, place, and time.  Skin: Skin is dry.  Psychiatric: She has a normal mood and affect.           Assessment & Plan:

## 2019-04-17 NOTE — Patient Instructions (Signed)
Continue Trulicity 1.43 mg once weekly for diabetes.  Start rosuvastatin 10 mg once daily for cholesterol.  Start exercising. You should be getting 150 minutes of moderate intensity exercise weekly.  It is important that you always work on your diet. Please limit carbohydrates in the form of white bread, rice, pasta, sweets, fast food, fried food, sugary drinks, etc. Increase your consumption of fresh fruits and vegetables, whole grains, lean protein.  Ensure you are consuming 64 ounces of water daily.  Please schedule a follow up appointment in 6 months for diabetes and a lab only appointment in 6 weeks for cholesterol check.   It was a pleasure to see you today!

## 2019-04-17 NOTE — Assessment & Plan Note (Addendum)
Improved on Trulicity and at goal <7.  Continue same.  She does agree to statin therapy, will send in Crestor 10 mg and repeat lipids in 6 weeks. Continue to work on diet and encouraged to start exercising.   Follow up in 6 months.

## 2019-04-17 NOTE — Assessment & Plan Note (Signed)
The 10-year ASCVD risk score Mikey Bussing DC Brooke Bonito., et al., 2013) is: 9.7%   Values used to calculate the score:     Age: 68 years     Sex: Female     Is Non-Hispanic African American: No     Diabetic: Yes     Tobacco smoker: No     Systolic Blood Pressure: 800 mmHg     Is BP treated: No     HDL Cholesterol: 55.3 mg/dL     Total Cholesterol: 218 mg/dL  She does agree to statin therapy, Rx for Crestor sent to pharmacy. Repeat lipids and LFT's in 6 weeks.

## 2019-05-29 ENCOUNTER — Other Ambulatory Visit: Payer: Self-pay

## 2019-05-31 ENCOUNTER — Other Ambulatory Visit (INDEPENDENT_AMBULATORY_CARE_PROVIDER_SITE_OTHER): Payer: Medicare HMO

## 2019-05-31 DIAGNOSIS — E785 Hyperlipidemia, unspecified: Secondary | ICD-10-CM | POA: Diagnosis not present

## 2019-05-31 LAB — COMPREHENSIVE METABOLIC PANEL
ALT: 13 U/L (ref 0–35)
AST: 24 U/L (ref 0–37)
Albumin: 3.9 g/dL (ref 3.5–5.2)
Alkaline Phosphatase: 72 U/L (ref 39–117)
BUN: 12 mg/dL (ref 6–23)
CO2: 29 mEq/L (ref 19–32)
Calcium: 9.4 mg/dL (ref 8.4–10.5)
Chloride: 104 mEq/L (ref 96–112)
Creatinine, Ser: 0.8 mg/dL (ref 0.40–1.20)
GFR: 71.79 mL/min (ref >60.00)
Glucose, Bld: 197 mg/dL — ABNORMAL HIGH (ref 70–99)
Potassium: 4.8 mEq/L (ref 3.5–5.1)
Sodium: 137 mEq/L (ref 135–145)
Total Bilirubin: 0.4 mg/dL (ref 0.2–1.2)
Total Protein: 6.7 g/dL (ref 6.0–8.3)

## 2019-05-31 LAB — LIPID PANEL
Cholesterol: 153 mg/dL (ref 0–200)
HDL: 53.9 mg/dL (ref >39.00)
LDL Cholesterol: 70 mg/dL (ref 0–99)
NonHDL: 99.11
Total CHOL/HDL Ratio: 3
Triglycerides: 144 mg/dL (ref 0.0–149.0)
VLDL: 28.8 mg/dL (ref 0.0–40.0)

## 2019-07-03 DIAGNOSIS — H401133 Primary open-angle glaucoma, bilateral, severe stage: Secondary | ICD-10-CM | POA: Diagnosis not present

## 2019-07-03 LAB — HM DIABETES EYE EXAM

## 2019-07-06 ENCOUNTER — Encounter: Payer: Self-pay | Admitting: Primary Care

## 2019-07-10 DIAGNOSIS — E119 Type 2 diabetes mellitus without complications: Secondary | ICD-10-CM

## 2019-07-11 MED ORDER — GLIPIZIDE ER 5 MG PO TB24: 5.0000 mg | ORAL_TABLET | Freq: Every day | ORAL | 1 refills | Status: DC

## 2019-10-02 DIAGNOSIS — H401133 Primary open-angle glaucoma, bilateral, severe stage: Secondary | ICD-10-CM | POA: Diagnosis not present

## 2019-10-12 ENCOUNTER — Other Ambulatory Visit: Payer: Self-pay | Admitting: Primary Care

## 2019-10-12 DIAGNOSIS — E039 Hypothyroidism, unspecified: Secondary | ICD-10-CM

## 2019-10-30 ENCOUNTER — Other Ambulatory Visit: Payer: Self-pay | Admitting: Primary Care

## 2019-10-30 ENCOUNTER — Encounter: Payer: Self-pay | Admitting: Primary Care

## 2019-10-30 ENCOUNTER — Other Ambulatory Visit: Payer: Self-pay

## 2019-10-30 ENCOUNTER — Ambulatory Visit (INDEPENDENT_AMBULATORY_CARE_PROVIDER_SITE_OTHER): Payer: Medicare HMO | Admitting: Primary Care

## 2019-10-30 VITALS — BP 126/82 | HR 90 | Temp 97.8°F | Ht 61.0 in | Wt 208.6 lb

## 2019-10-30 DIAGNOSIS — Z23 Encounter for immunization: Secondary | ICD-10-CM

## 2019-10-30 DIAGNOSIS — E119 Type 2 diabetes mellitus without complications: Secondary | ICD-10-CM

## 2019-10-30 DIAGNOSIS — R21 Rash and other nonspecific skin eruption: Secondary | ICD-10-CM | POA: Diagnosis not present

## 2019-10-30 LAB — POCT GLYCOSYLATED HEMOGLOBIN (HGB A1C): Hemoglobin A1C: 10.8 % — AB (ref 4.0–5.6)

## 2019-10-30 MED ORDER — INSULIN GLARGINE 100 UNIT/ML ~~LOC~~ SOLN: 10.0000 [IU] | Freq: Every day | SUBCUTANEOUS | 2 refills | Status: DC

## 2019-10-30 MED ORDER — "INSULIN SYRINGE 30G X 5/16"" 0.5 ML MISC": 1 refills | Status: DC

## 2019-10-30 MED ORDER — FLUCONAZOLE 200 MG PO TABS: 200.0000 mg | ORAL_TABLET | Freq: Every day | ORAL | 0 refills | Status: DC

## 2019-10-30 MED ORDER — METFORMIN HCL 500 MG PO TABS: 500.0000 mg | ORAL_TABLET | Freq: Two times a day (BID) | ORAL | 0 refills | Status: DC

## 2019-10-30 MED ORDER — GLIPIZIDE ER 10 MG PO TB24: 10.0000 mg | ORAL_TABLET | Freq: Every day | ORAL | 1 refills | Status: DC

## 2019-10-30 NOTE — Progress Notes (Signed)
Subjective:    Patient ID: Valerie Gordon, female    DOB: 04-10-1951, 68 y.o.   MRN: [id number]  HPI  Valerie Gordon is a 68 year old female who presents today for follow up of type 2 diabetes. She would also like to discuss an oral rash.  Current medications include: Glipizide XL 10 mg, recently increased from 5 mg.  She is checking her blood glucose 2 times daily and is getting readings of:  AM fasting: low 300's Bedtime: 270's  She's been checking her glucose for the last one month, had been off of her medications for three months, resumed about one month ago at Glipizide 5 mg. She endorses a poor diet.  Last A1C: 7.7 in January 2020 Last Eye Exam: Due in July 2021 Last Foot Exam: Due Pneumonia Vaccination: Completed in 2017 ACE/ARB: Urine micro albumin due Statin: Crestor  BP Readings from Last 3 Encounters:  10/30/19 126/82  01/18/19 120/78  08/10/18 120/78   2) Rash: Located around her oral cavity for which she's noticed intermittently for about one month. She describes the rash as itchy, red, burning. She had a similar rash to the vaginal area one year ago, this feels similar. She was treated with oral fluconazole pills in the past for her vaginal rash. She's tried gargling with vinegar and salt water without improvement/resolve.    Review of Systems  Eyes: Negative for visual disturbance.  Respiratory: Negative for shortness of breath.   Cardiovascular: Negative for chest pain.  Skin: Positive for rash.  Neurological: Negative for dizziness and numbness.       Past Medical History:  Diagnosis Date  . Cataract   . Depression   . GERD (gastroesophageal reflux disease)   . Glaucoma   . Thyroid disease   . Type 2 diabetes mellitus ([place])    manages with diet and exercise     Social History   Socioeconomic History  . Marital status: Widowed    Spouse name: Not on file  . Number of children: Not on file  . Years of education: Not on file  . Highest education  level: Not on file  Occupational History  . Not on file  Social Needs  . Financial resource strain: Not on file  . Food insecurity    Worry: Not on file    Inability: Not on file  . Transportation needs    Medical: Not on file    Non-medical: Not on file  Tobacco Use  . Smoking status: Never Smoker  . Smokeless tobacco: Never Used  Substance and Sexual Activity  . Alcohol use: Yes  . Drug use: No  . Sexual activity: Yes  Lifestyle  . Physical activity    Days per week: 0 days    Minutes per session: Not on file  . Stress: Not on file  Relationships  . Social Herbalist on phone: Not on file    Gets together: Not on file    Attends religious service: Not on file    Active member of club or organization: Not on file    Attends meetings of clubs or organizations: Not on file    Relationship status: Not on file  . Intimate partner violence    Fear of current or ex partner: Not on file    Emotionally abused: Not on file    Physically abused: Not on file    Forced sexual activity: Not on file  Other Topics Concern  .  Not on file  Social History Narrative   Widower.   Step children.   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 BREAST BIOPSY  2010  . CARDIAC CATHETERIZATION  1998  . COLONOSCOPY  10-30-2004  . COLONOSCOPY WITH PROPOFOL N/A 04/01/2016   Procedure: COLONOSCOPY WITH PROPOFOL;  Surgeon: Robert Bellow, MD;  Location: [facility] ENDOSCOPY;  Service: Endoscopy;  Laterality: N/A;  . EYE SURGERY Bilateral    cataract sx  . TONSILLECTOMY      Family History  Problem Relation Age of Onset  . Stroke Mother   . Heart disease Father     Allergies  Allergen Reactions  . Antihistamines, Chlorpheniramine-Type   . Ciprocin-Fluocin-Procin [Fluocinolone]   . Metformin And Related     Jittery, diarrhea  . Trulicity [Dulaglutide]      Current Outpatient Medications on File Prior to Visit  Medication Sig Dispense Refill  . b complex vitamins tablet Take 1 tablet by mouth daily.    Marland Kitchen BIOTIN PO Take 5 mg by mouth.    . Cholecalciferol (D3 HIGH POTENCY) 2000 units CAPS Take by mouth.    Javier Docker Oil 300 MG CAPS Take by mouth.    . latanoprost (XALATAN) 0.005 % ophthalmic solution 1 drop at bedtime.    Marland Kitchen levothyroxine (SYNTHROID) 100 MCG tablet TAKE 1 TABLET BY MOUTH EVERY MORNING ON AN EMPTY STOMACH AND FULL GLASS OF WATER 90 tablet 1  . Magnesium 200 MG TABS Take by mouth.    . Menaquinone-7 (VITAMIN K2 PO) Take 19 mcg by mouth.    . rosuvastatin (CRESTOR) 10 MG tablet Take 1 tablet (10 mg total) by mouth daily. For cholesterol. 90 tablet 3  . VITAMIN A PO Take 2,400 mcg by mouth.     No current facility-administered medications on file prior to visit.     BP 126/82   Pulse 90   Temp 97.8 F (36.6 C) (Temporal)   Ht 5\' 1"  (1.549 m)   Wt 208 lb 9 oz (94.6 kg)   SpO2 99%   BMI 39.41 kg/m   ' Objective:   Physical Exam  Constitutional: She appears well-nourished.  Neck: Neck supple.  Cardiovascular: Normal rate and regular rhythm.  Respiratory: Effort normal and breath sounds normal.  Skin: Skin is warm and dry. Rash noted.  Moderately red, dry, rash around oral cavity without papules.            Assessment & Plan:

## 2019-10-30 NOTE — Assessment & Plan Note (Signed)
Uncontrolled, has been off mediation for a three month time period, also not checking glucose readings.  A1C today of 10.9 which is a drastic increase from earlier this year. She would like to retry Metformin given her insulin resistance despite side effects from last time.   Stop Glipizide, start Metformin 500 mg BID. Add Lantus 10 units HS. Syringes sent to pharmacy.   We will see her back in the office in 6 weeks for diabetes follow up. Glucose logs provided.   Urine microalbumin and foot exams completed today.

## 2019-10-30 NOTE — Assessment & Plan Note (Signed)
Appears fungal. Treat with fluconazole 200 mg daily x 7 days. She will update.

## 2019-10-30 NOTE — Patient Instructions (Addendum)
Inject 10 units of Lantus at bedtime for diabetes. Continue Glipizide XL 10 mg for diabetes.  Start fluconazole 200 mg tablets for rash. Take 1 tablet by mouth once daily for 7 days.   Continue to monitor your blood sugars as discussed. Record your readings and bring the logs back during your next visit.  Stop by the lab prior to leaving today. I will notify you of your results once received.   Schedule a follow up visit for 6 weeks for diabetes check.   It was a pleasure to see you today!   Diabetes Mellitus and Nutrition, Adult When you have diabetes (diabetes mellitus), it is very important to have healthy eating habits because your blood sugar (glucose) levels are greatly affected by what you eat and drink. Eating healthy foods in the appropriate amounts, at about the same times every day, can help you:  Control your blood glucose.  Lower your risk of heart disease.  Improve your blood pressure.  Reach or maintain a healthy weight. Every person with diabetes is different, and each person has different needs for a meal plan. Your health care provider may recommend that you work with a diet and nutrition specialist (dietitian) to make a meal plan that is best for you. Your meal plan may vary depending on factors such as:  The calories you need.  The medicines you take.  Your weight.  Your blood glucose, blood pressure, and cholesterol levels.  Your activity level.  Other health conditions you have, such as heart or kidney disease. How do carbohydrates affect me? Carbohydrates, also called carbs, affect your blood glucose level more than any other type of food. Eating carbs naturally raises the amount of glucose in your blood. Carb counting is a method for keeping track of how many carbs you eat. Counting carbs is important to keep your blood glucose at a healthy level, especially if you use insulin or take certain oral diabetes medicines. It is important to know how many  carbs you can safely have in each meal. This is different for every person. Your dietitian can help you calculate how many carbs you should have at each meal and for each snack. Foods that contain carbs include:  Bread, cereal, rice, pasta, and crackers.  Potatoes and corn.  Peas, beans, and lentils.  Milk and yogurt.  Fruit and juice.  Desserts, such as cakes, cookies, ice cream, and candy. How does alcohol affect me? Alcohol can cause a sudden decrease in blood glucose (hypoglycemia), especially if you use insulin or take certain oral diabetes medicines. Hypoglycemia can be a life-threatening condition. Symptoms of hypoglycemia (sleepiness, dizziness, and confusion) are similar to symptoms of having too much alcohol. If your health care provider says that alcohol is safe for you, follow these guidelines:  Limit alcohol intake to no more than 1 drink per day for nonpregnant women and 2 drinks per day for men. One drink equals 12 oz of beer, 5 oz of wine, or 1 oz of hard liquor.  Do not drink on an empty stomach.  Keep yourself hydrated with water, diet soda, or unsweetened iced tea.  Keep in mind that regular soda, juice, and other mixers may contain a lot of sugar and must be counted as carbs. What are tips for following this plan?  Reading food labels  Start by checking the serving size on the "Nutrition Facts" label of packaged foods and drinks. The amount of calories, carbs, fats, and other nutrients listed on the  label is based on one serving of the item. Many items contain more than one serving per package.  Check the total grams (g) of carbs in one serving. You can calculate the number of servings of carbs in one serving by dividing the total carbs by 15. For example, if a food has 30 g of total carbs, it would be equal to 2 servings of carbs.  Check the number of grams (g) of saturated and trans fats in one serving. Choose foods that have low or no amount of these  fats.  Check the number of milligrams (mg) of salt (sodium) in one serving. Most people should limit total sodium intake to less than 2,300 mg per day.  Always check the nutrition information of foods labeled as "low-fat" or "nonfat". These foods may be higher in added sugar or refined carbs and should be avoided.  Talk to your dietitian to identify your daily goals for nutrients listed on the label. Shopping  Avoid buying canned, premade, or processed foods. These foods tend to be high in fat, sodium, and added sugar.  Shop around the outside edge of the grocery store. This includes fresh fruits and vegetables, bulk grains, fresh meats, and fresh dairy. Cooking  Use low-heat cooking methods, such as baking, instead of high-heat cooking methods like deep frying.  Cook using healthy oils, such as olive, canola, or sunflower oil.  Avoid cooking with butter, cream, or high-fat meats. Meal planning  Eat meals and snacks regularly, preferably at the same times every day. Avoid going long periods of time without eating.  Eat foods high in fiber, such as fresh fruits, vegetables, beans, and whole grains. Talk to your dietitian about how many servings of carbs you can eat at each meal.  Eat 4-6 ounces (oz) of lean protein each day, such as lean meat, chicken, fish, eggs, or tofu. One oz of lean protein is equal to: ? 1 oz of meat, chicken, or fish. ? 1 egg. ?  cup of tofu.  Eat some foods each day that contain healthy fats, such as avocado, nuts, seeds, and fish. Lifestyle  Check your blood glucose regularly.  Exercise regularly as told by your health care provider. This may include: ? 150 minutes of moderate-intensity or vigorous-intensity exercise each week. This could be brisk walking, biking, or water aerobics. ? Stretching and doing strength exercises, such as yoga or weightlifting, at least 2 times a week.  Take medicines as told by your health care provider.  Do not use any  products that contain nicotine or tobacco, such as cigarettes and e-cigarettes. If you need help quitting, ask your health care provider.  Work with a Social worker or diabetes educator to identify strategies to manage stress and any emotional and social challenges. Questions to ask a health care provider  Do I need to meet with a diabetes educator?  Do I need to meet with a dietitian?  What number can I call if I have questions?  When are the best times to check my blood glucose? Where to find more information:  American Diabetes Association: diabetes.org  Academy of Nutrition and Dietetics: www.eatright.[employer] of Diabetes and Digestive and Kidney Diseases (NIH): [employer] Summary  A healthy meal plan will help you control your blood glucose and maintain a healthy lifestyle.  Working with a diet and nutrition specialist (dietitian) can help you make a meal plan that is best for you.  Keep in mind that carbohydrates (carbs) and alcohol have  immediate effects on your blood glucose levels. It is important to count carbs and to use alcohol carefully. This information is not intended to replace advice given to you by your health care provider. Make sure you discuss any questions you have with your health care provider. Document Released: 09/10/2005 Document Revised: 11/26/2017 Document Reviewed: 01/18/2017 Elsevier Patient Education  2020 [employer].

## 2019-10-31 LAB — MICROALBUMIN / CREATININE URINE RATIO
Creatinine,U: 59.6 mg/dL
Microalb Creat Ratio: 1.2 mg/g (ref 0.0–30.0)
Microalb, Ur: <0.7 mg/dL (ref 0.0–1.9)

## 2019-11-02 MED ORDER — "PEN NEEDLES 5/16"" 30G X 8 MM MISC": 1 refills | Status: DC

## 2019-11-06 ENCOUNTER — Other Ambulatory Visit: Payer: Self-pay | Admitting: Primary Care

## 2019-11-06 DIAGNOSIS — Z1231 Encounter for screening mammogram for malignant neoplasm of breast: Secondary | ICD-10-CM

## 2019-11-13 DIAGNOSIS — B372 Candidiasis of skin and nail: Secondary | ICD-10-CM | POA: Diagnosis not present

## 2019-11-23 ENCOUNTER — Other Ambulatory Visit: Payer: Self-pay | Admitting: Primary Care

## 2019-11-23 DIAGNOSIS — E119 Type 2 diabetes mellitus without complications: Secondary | ICD-10-CM

## 2019-12-05 DIAGNOSIS — B372 Candidiasis of skin and nail: Secondary | ICD-10-CM | POA: Diagnosis not present

## 2019-12-07 DIAGNOSIS — Z794 Long term (current) use of insulin: Secondary | ICD-10-CM | POA: Diagnosis not present

## 2019-12-07 DIAGNOSIS — Z823 Family history of stroke: Secondary | ICD-10-CM | POA: Diagnosis not present

## 2019-12-07 DIAGNOSIS — R69 Illness, unspecified: Secondary | ICD-10-CM | POA: Diagnosis not present

## 2019-12-07 DIAGNOSIS — E785 Hyperlipidemia, unspecified: Secondary | ICD-10-CM | POA: Diagnosis not present

## 2019-12-07 DIAGNOSIS — E119 Type 2 diabetes mellitus without complications: Secondary | ICD-10-CM | POA: Diagnosis not present

## 2019-12-07 DIAGNOSIS — R32 Unspecified urinary incontinence: Secondary | ICD-10-CM | POA: Diagnosis not present

## 2019-12-07 DIAGNOSIS — E039 Hypothyroidism, unspecified: Secondary | ICD-10-CM | POA: Diagnosis not present

## 2019-12-07 DIAGNOSIS — M1990 Unspecified osteoarthritis, unspecified site: Secondary | ICD-10-CM | POA: Diagnosis not present

## 2019-12-07 DIAGNOSIS — H409 Unspecified glaucoma: Secondary | ICD-10-CM | POA: Diagnosis not present

## 2019-12-11 ENCOUNTER — Ambulatory Visit (INDEPENDENT_AMBULATORY_CARE_PROVIDER_SITE_OTHER): Payer: Medicare HMO | Admitting: Primary Care

## 2019-12-11 ENCOUNTER — Other Ambulatory Visit: Payer: Self-pay

## 2019-12-11 ENCOUNTER — Encounter: Payer: Self-pay | Admitting: Primary Care

## 2019-12-11 DIAGNOSIS — E119 Type 2 diabetes mellitus without complications: Secondary | ICD-10-CM

## 2019-12-11 MED ORDER — BASAGLAR KWIKPEN 100 UNIT/ML ~~LOC~~ SOPN: 25.0000 [IU] | PEN_INJECTOR | Freq: Every day | SUBCUTANEOUS | 2 refills | Status: DC

## 2019-12-11 MED ORDER — METFORMIN HCL 500 MG PO TABS: 1000.0000 mg | ORAL_TABLET | Freq: Two times a day (BID) | ORAL | 3 refills | Status: DC

## 2019-12-11 NOTE — Progress Notes (Signed)
Subjective:    Patient ID: Valerie Gordon, female    DOB: Sep 30, 1951, 68 y.o.   MRN: [id number]  HPI  [name] is a 68 year old female with a history of hypothyroidism, type 2 diabetes, GERD, hyperlipidemia who presents today for follow up of diabetes.  She was last evaluated on 10/30/19, A1C reading of 10.8 which was a drastic increase. She was managed on Glipizide alone but preferred to stop and resume Metformin. We also introduced Lantus 20 units HS.  Since her last visit she is checking her blood glucose 1 times daily and is getting readings of:  AM fasting: low 200's  Last A1C: 10.8 in early November 2020 Last Eye Exam: Completed in July 2020 Last Foot Exam: Completed in November 2020 Pneumonia Vaccination: Completed last in 2017 ACE/ARB: None. Urine microalbumin negative in November 2020 Statin: Crestor   BP Readings from Last 3 Encounters:  12/11/19 122/80  10/30/19 126/82  01/18/19 120/78    She is on a fluconazole treatment for recurrent yeast type rash, has been on treatment for one month total, following with dermatology. She is tolerating Metformin well overall.    Review of Systems  Respiratory: Negative for shortness of breath.   Cardiovascular: Negative for chest pain.  Neurological: Negative for dizziness and numbness.       Past Medical History:  Diagnosis Date  . Cataract   . Depression   . GERD (gastroesophageal reflux disease)   . Glaucoma   . Thyroid disease   . Type 2 diabetes mellitus ([place])    manages with diet and exercise     Social History   Socioeconomic History  . Marital status: Widowed    Spouse name: Not on file  . Number of children: Not on file  . Years of education: Not on file  . Highest education level: Not on file  Occupational History  . Not on file  Tobacco Use  . Smoking status: Never Smoker  . Smokeless tobacco: Never Used  Substance and Sexual Activity  . Alcohol use: Yes  . Drug use: No  . Sexual activity:  Yes  Other Topics Concern  . Not on file  Social History Narrative   Widower.   Step children.   Environmental health practitioner.   Enjoys riding hot air balloons, spending time with friends, reading, jewelry   Social Determinants of Health   Financial Resource Strain:   . Difficulty of Paying Living Expenses: Not on file  Food Insecurity:   . Worried About Charity fundraiser in the Last Year: Not on file  . Ran Out of Food in the Last Year: Not on file  Transportation Needs:   . Lack of Transportation (Medical): Not on file  . Lack of Transportation (Non-Medical): Not on file  Physical Activity:   . Days of Exercise per Week: Not on file  . Minutes of Exercise per Session: Not on file  Stress:   . Feeling of Stress : Not on file  Social Connections:   . Frequency of Communication with Friends and Family: Not on file  . Frequency of Social Gatherings with Friends and Family: Not on file  . Attends Religious Services: Not on file  . Active Member of Clubs or Organizations: Not on file  . Attends Archivist Meetings: Not on file  . Marital Status: Not on file  Intimate Partner Violence:   . Fear of Current or Ex-Partner: Not on file  . Emotionally  Abused: Not on file  . Physically Abused: Not on file  . Sexually Abused: Not on file    Past Surgical History:  Procedure Laterality Date  . BREAST BIOPSY  2010  . CARDIAC CATHETERIZATION  1998  . COLONOSCOPY  10-30-2004  . COLONOSCOPY WITH PROPOFOL N/A 04/01/2016   Procedure: COLONOSCOPY WITH PROPOFOL;  Surgeon: Robert Bellow, MD;  Location: [facility] ENDOSCOPY;  Service: Endoscopy;  Laterality: N/A;  . EYE SURGERY Bilateral    cataract sx  . TONSILLECTOMY      Family History  Problem Relation Age of Onset  . Stroke Mother   . Heart disease Father     Allergies  Allergen Reactions  . Antihistamines, Chlorpheniramine-Type   . Ciprocin-Fluocin-Procin [Fluocinolone]   . Metformin And Related     Jittery,  diarrhea  . Trulicity [Dulaglutide]     Current Outpatient Medications on File Prior to Visit  Medication Sig Dispense Refill  . b complex vitamins tablet Take 1 tablet by mouth daily.    Marland Kitchen BIOTIN PO Take 5 mg by mouth.    . Cholecalciferol (D3 HIGH POTENCY) 2000 units CAPS Take by mouth.    . fluconazole (DIFLUCAN) 100 MG tablet Take 100 mg by mouth daily.    . Insulin Pen Needle (PEN NEEDLES 5/16") 30G X 8 MM MISC Use nightly with insulin 100 each 1  . Krill Oil 300 MG CAPS Take by mouth.    . latanoprost (XALATAN) 0.005 % ophthalmic solution 1 drop at bedtime.    Marland Kitchen levothyroxine (SYNTHROID) 100 MCG tablet TAKE 1 TABLET BY MOUTH EVERY MORNING ON AN EMPTY STOMACH AND FULL GLASS OF WATER 90 tablet 1  . Magnesium 200 MG TABS Take by mouth.    . Menaquinone-7 (VITAMIN K2 PO) Take 19 mcg by mouth.    . rosuvastatin (CRESTOR) 10 MG tablet Take 1 tablet (10 mg total) by mouth daily. For cholesterol. 90 tablet 3  . VITAMIN A PO Take 2,400 mcg by mouth.     No current facility-administered medications on file prior to visit.    BP 122/80   Pulse 65   Temp (!) 96.9 F (36.1 C) (Temporal)   Ht 5\' 1"  (1.549 m)   Wt 210 lb (95.3 kg)   SpO2 98%   BMI 39.68 kg/m    Objective:   Physical Exam  Constitutional: She appears well-nourished.  Cardiovascular: Normal rate and regular rhythm.  Respiratory: Effort normal and breath sounds normal.  Musculoskeletal:     Cervical back: Neck supple.  Skin: Skin is warm and dry.  Psychiatric: She has a normal mood and affect.           Assessment & Plan:

## 2019-12-11 NOTE — Patient Instructions (Signed)
We've increased your basaglar dose to 25 units once daily.  We've increased your metformin to 1000 mg twice daily, please update me if you develop any problems.  Work on a healthy diet and regular exercise.   Schedule a follow up visit for on or after early February 2021  It was a pleasure to see you today!

## 2019-12-11 NOTE — Assessment & Plan Note (Signed)
Glucose readings mproved but not yet at goal. Increase Lantus to 25 units from 20 units. Increase Metformin to 1000 mg BID given tolerance.  Urine microalbumin negative in November 2020. Pneumonia vaccination UTD. Foot and eye exams UTD. Managed on statin.  Follow up in early February 2021.

## 2020-01-11 ENCOUNTER — Other Ambulatory Visit: Payer: Self-pay | Admitting: Primary Care

## 2020-01-11 DIAGNOSIS — E119 Type 2 diabetes mellitus without complications: Secondary | ICD-10-CM

## 2020-01-12 MED ORDER — BASAGLAR KWIKPEN 100 UNIT/ML ~~LOC~~ SOPN: 28.0000 [IU] | PEN_INJECTOR | Freq: Every day | SUBCUTANEOUS | 2 refills | Status: DC

## 2020-01-17 ENCOUNTER — Other Ambulatory Visit: Payer: Self-pay | Admitting: Primary Care

## 2020-01-17 DIAGNOSIS — E119 Type 2 diabetes mellitus without complications: Secondary | ICD-10-CM

## 2020-01-22 MED ORDER — METFORMIN HCL 1000 MG PO TABS: 1000.0000 mg | ORAL_TABLET | Freq: Two times a day (BID) | ORAL | 3 refills | Status: DC

## 2020-01-26 ENCOUNTER — Other Ambulatory Visit: Payer: Self-pay | Admitting: Primary Care

## 2020-01-26 MED ORDER — "PEN NEEDLES 5/16"" 30G X 8 MM MISC": 2 refills | Status: DC

## 2020-02-01 ENCOUNTER — Other Ambulatory Visit: Payer: Self-pay | Admitting: Primary Care

## 2020-02-01 ENCOUNTER — Telehealth: Payer: Self-pay

## 2020-02-01 NOTE — Telephone Encounter (Signed)
LVM to inform pt no lab appt needed on 2.15.2021 per Allie Bossier

## 2020-02-12 ENCOUNTER — Other Ambulatory Visit: Payer: Medicare HMO

## 2020-02-12 ENCOUNTER — Ambulatory Visit
Admission: RE | Admit: 2020-02-12 | Discharge: 2020-02-12 | Disposition: A | Discharge status: Home / Self Care | Payer: Medicare HMO | Source: Ambulatory Visit | Attending: Primary Care | Admitting: Primary Care

## 2020-02-12 DIAGNOSIS — Z1231 Encounter for screening mammogram for malignant neoplasm of breast: Secondary | ICD-10-CM | POA: Diagnosis not present

## 2020-02-12 IMAGING — MG DIGITAL SCREENING BILAT W/ TOMO W/ CAD
6 of 10 series · 6 of 30 positions shown · non-contrast
Comparison: Previous exam(s).

CLINICAL DATA: Screening.

EXAM:
DIGITAL SCREENING BILATERAL MAMMOGRAM WITH TOMO AND CAD

[R MLO synth-2D (1 of 2)]
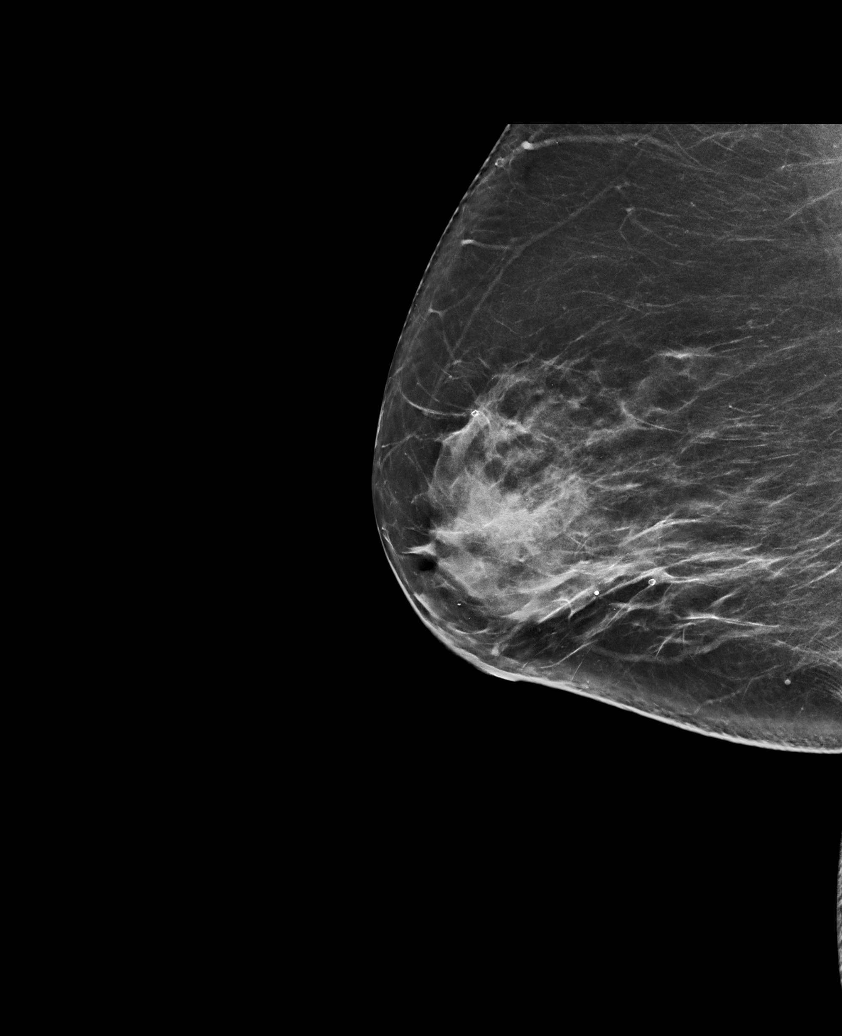

[R CC synth-2D]
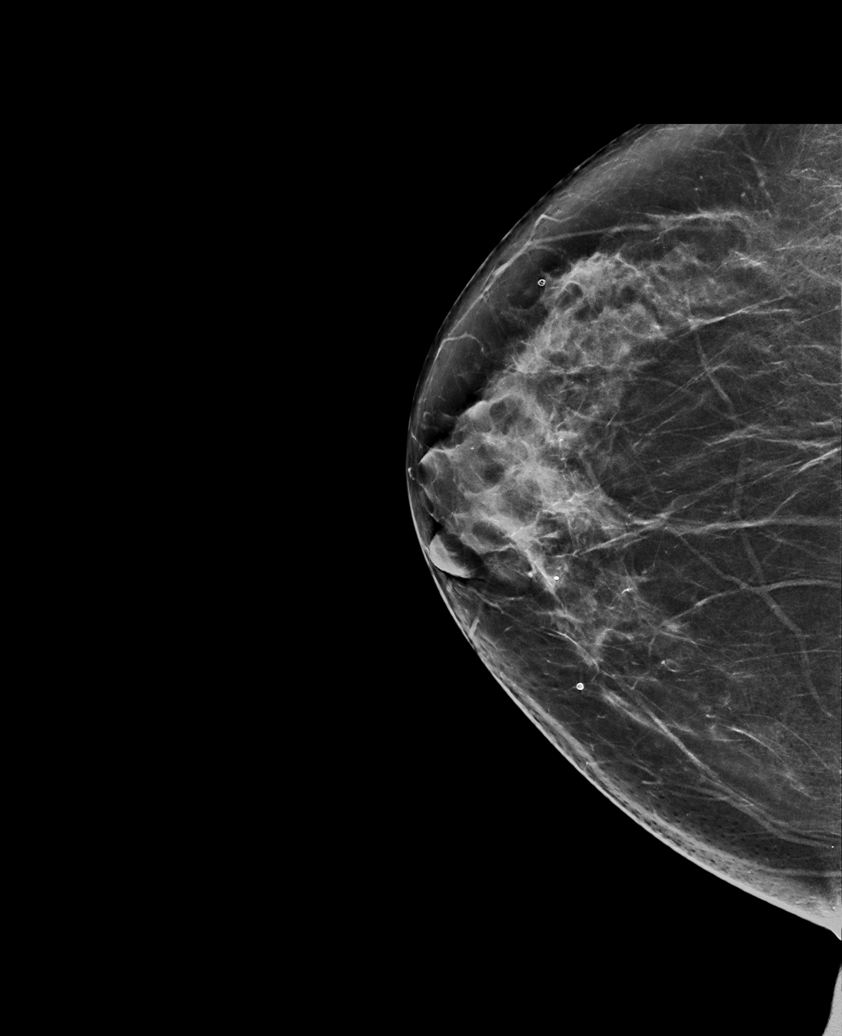

[L CC synth-2D]
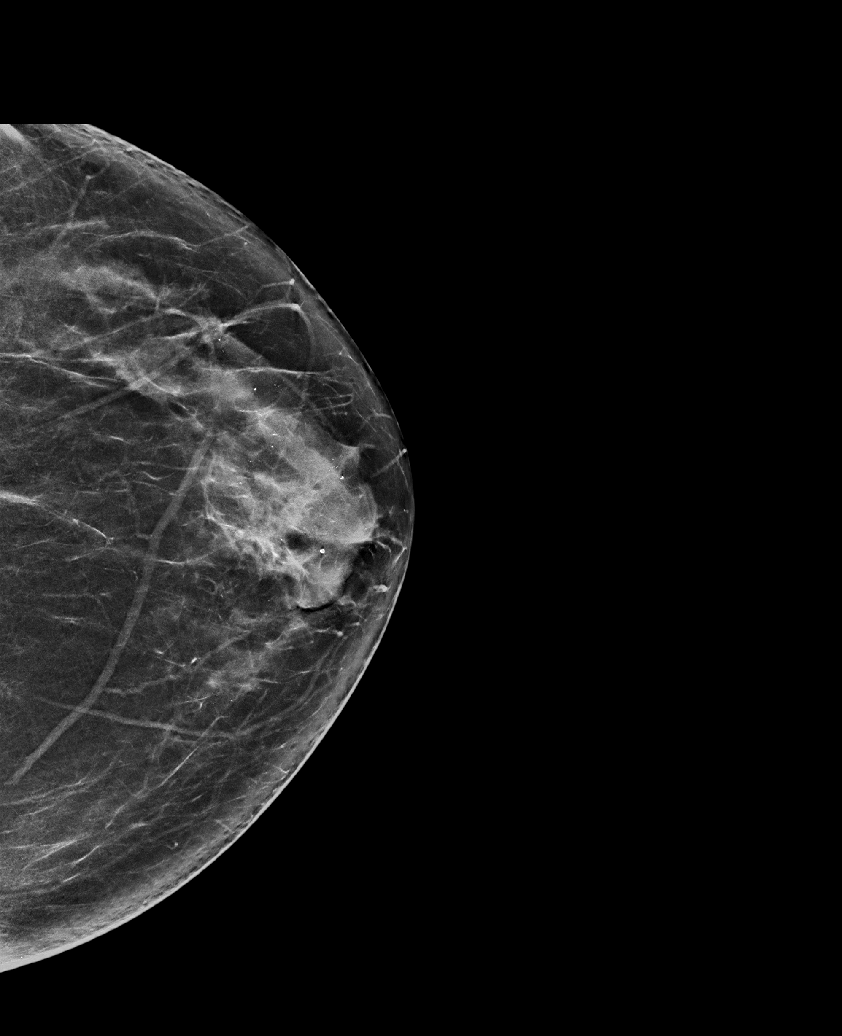

[R MLO synth-2D (2 of 2)]
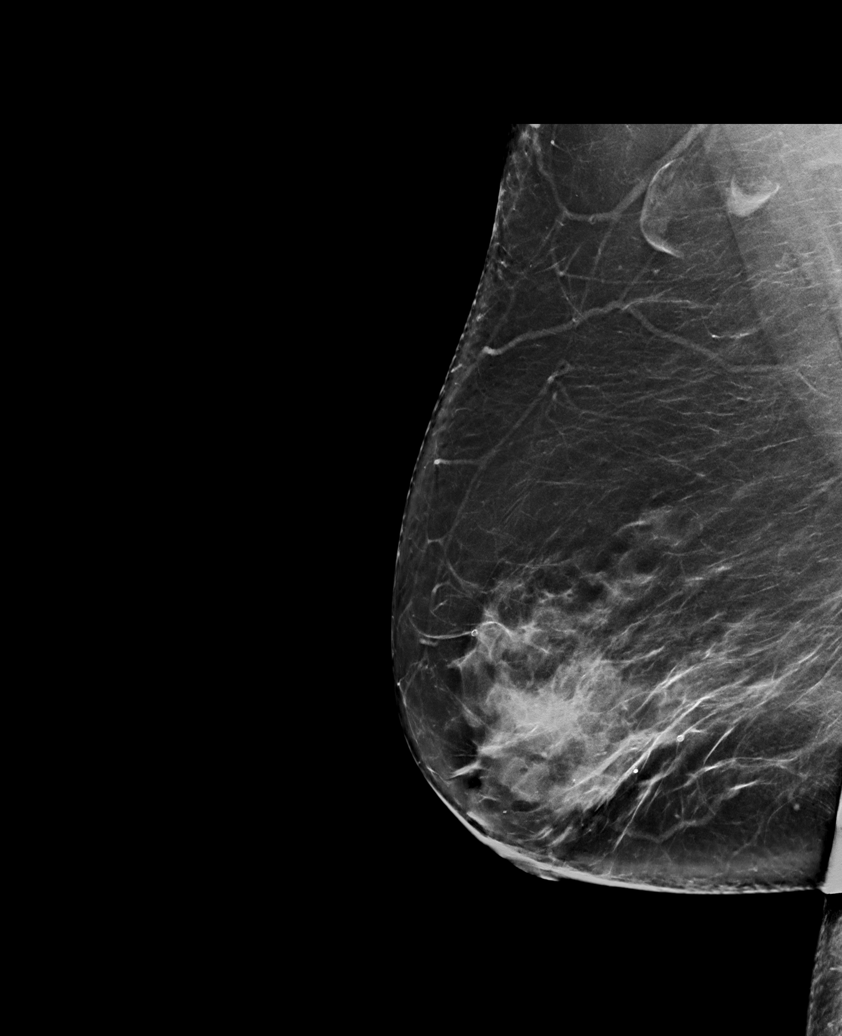

[L MLO synth-2D]
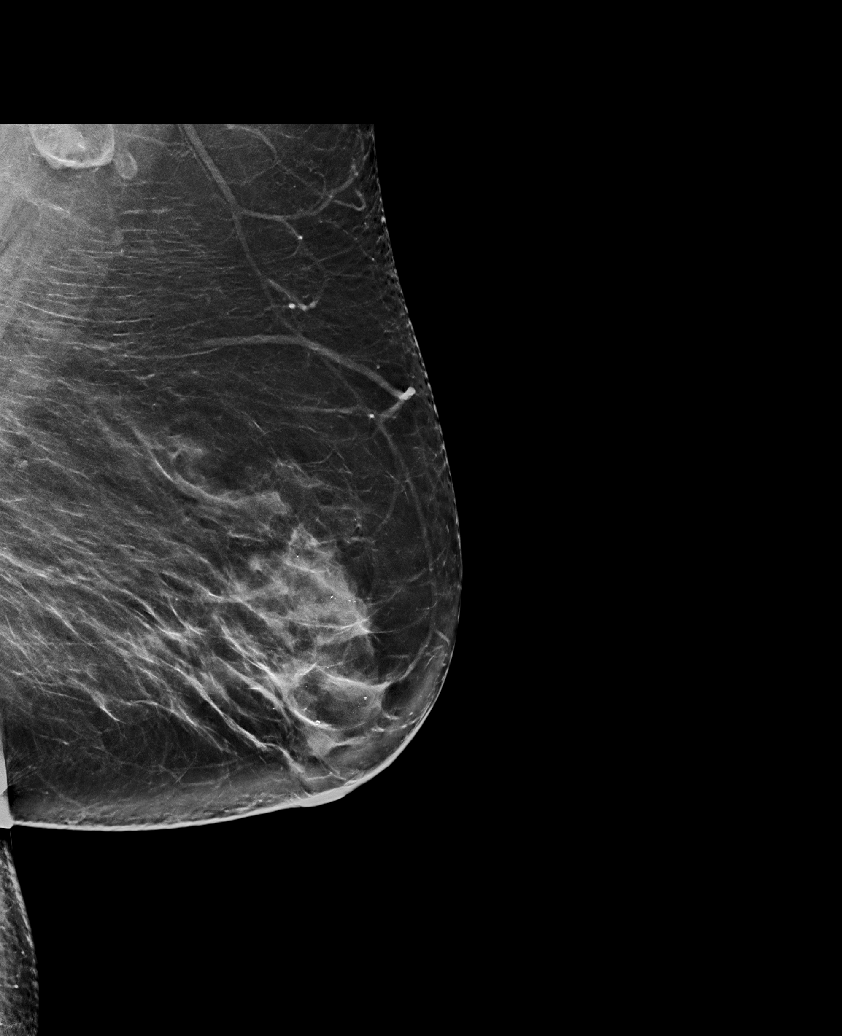

[R MLO tomo · tomo slice 45/89.0]
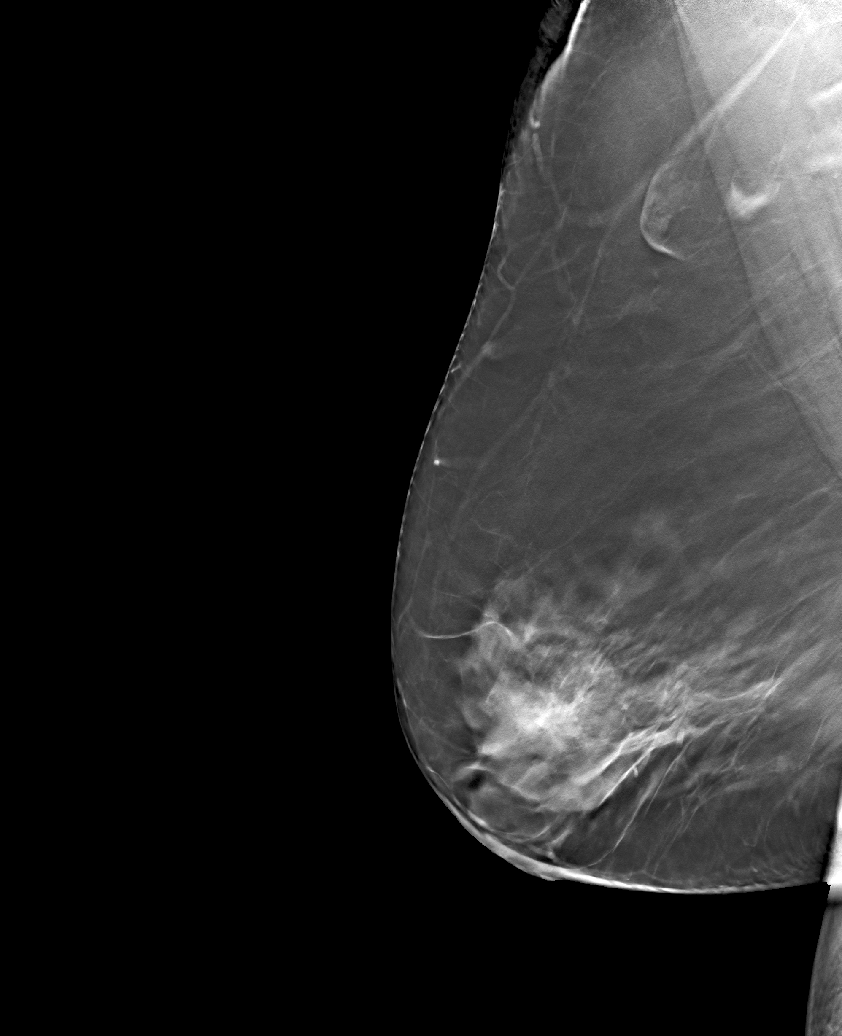

[6 of 30 positions shown; findings below may reference images not displayed]

ACR Breast Density Category c: The breast tissue is heterogeneously
dense, which may obscure small masses.
FINDINGS: There are no findings suspicious for malignancy. Images were
processed with CAD.
IMPRESSION: No mammographic evidence of malignancy. A result letter of this
screening mammogram will be mailed directly to the patient.

RECOMMENDATION:
Screening mammogram in one year. (Code:[5V])

BI-RADS CATEGORY  1: Negative.

## 2020-02-16 ENCOUNTER — Ambulatory Visit: Payer: Medicare HMO | Admitting: Primary Care

## 2020-02-16 ENCOUNTER — Ambulatory Visit (INDEPENDENT_AMBULATORY_CARE_PROVIDER_SITE_OTHER): Payer: Medicare HMO | Admitting: Primary Care

## 2020-02-16 ENCOUNTER — Encounter: Payer: Self-pay | Admitting: Primary Care

## 2020-02-16 VITALS — BP 124/82 | HR 83 | Temp 97.4°F | Ht 61.0 in | Wt 216.2 lb

## 2020-02-16 DIAGNOSIS — R21 Rash and other nonspecific skin eruption: Secondary | ICD-10-CM | POA: Diagnosis not present

## 2020-02-16 DIAGNOSIS — E119 Type 2 diabetes mellitus without complications: Secondary | ICD-10-CM

## 2020-02-16 DIAGNOSIS — E039 Hypothyroidism, unspecified: Secondary | ICD-10-CM | POA: Diagnosis not present

## 2020-02-16 LAB — POCT GLYCOSYLATED HEMOGLOBIN (HGB A1C): Hemoglobin A1C: 7.2 % — AB (ref 4.0–5.6)

## 2020-02-16 MED ORDER — BASAGLAR KWIKPEN 100 UNIT/ML ~~LOC~~ SOPN: 20.0000 [IU] | PEN_INJECTOR | Freq: Two times a day (BID) | SUBCUTANEOUS | 2 refills | Status: DC

## 2020-02-16 NOTE — Assessment & Plan Note (Signed)
Following with dermatology, no resolve, only temporary improvement with oral antifungals.   Check ANA, CBC today. Referral placed to ENT per patient request.

## 2020-02-16 NOTE — Assessment & Plan Note (Signed)
Improved with A1C today of 7.2. She has increased her basaglar on her own to 40 units, will continue this dose but divide to 20 units BID. Continue metformin.  Managed on statin. Urine microalbumin UTD. Pneumonia vaccination UTD. Foot exam UTD. Eye exam UTD.  Follow up in 6 months.

## 2020-02-16 NOTE — Patient Instructions (Addendum)
Stop by the lab prior to leaving today. I will notify you of your results once received.   Split the basaglar to 20 units in the morning and 20 units at night.  Continue metformin.  You will be contacted regarding your referral to ENT.  Please let us know if you have not been contacted within two weeks.   Please schedule a follow up appointment in 6 months for physical.  It was a pleasure to see you today!

## 2020-02-16 NOTE — Assessment & Plan Note (Signed)
Repeat TSH pending. Continue levothyroxine 100 mcg.

## 2020-02-16 NOTE — Progress Notes (Signed)
Subjective:    Patient ID: Valerie Gordon, female    DOB: [birth year], 69 y.o.   MRN: [id number]  HPI  This visit occurred during the SARS-CoV-2 public health emergency.  Safety protocols were in place, including screening questions prior to the visit, additional usage of staff PPE, and extensive cleaning of exam room while observing appropriate contact time as indicated for disinfecting solutions.   Valerie Gordon is a 69 year old female with a history of type 2 diabetes, hypothyroidism, hyperlipidemia who presents today for follow up of diabetes.  Current medications include: Metformin 1000 mg BID, Basaglar 28 units HS. She has increased her dose of Basaglar to 40 units on her own for the past month.   She is checking her blood glucose 1 times daily and is getting readings of:  AM fasting: 160's-low 200's.   Last A1C: 10.8 in November 2020,  Last [place] Exam: Completed in July 2020 Last Foot Exam: Due in November 2021 Pneumonia Vaccination: Completed in 2017 ACE/ARB: None. Urine microalbumin  Statin: Crestor   BP Readings from Last 3 Encounters:  02/16/20 124/82  12/11/19 122/80  10/30/19 126/82        Review of Systems  Eyes: Negative for visual disturbance.  Respiratory: Negative for shortness of breath.   Cardiovascular: Negative for chest pain.  Neurological: Negative for dizziness and headaches.       Past Medical History:  Diagnosis Date  . Cataract   . Depression   . GERD (gastroesophageal reflux disease)   . Glaucoma   . Thyroid disease   . Type 2 diabetes mellitus (Valerie Gordon)    manages with diet and exercise     Social History   Socioeconomic History  . Marital status: Widowed    Spouse name: Not on file  . Number of children: Not on file  . Years of education: Not on file  . Highest education level: Not on file  Occupational History  . Not on file  Tobacco Use  . Smoking status: Never Smoker  . Smokeless tobacco: Never Used  Substance and Sexual  Activity  . Alcohol use: Yes  . Drug use: No  . Sexual activity: Yes  Other Topics Concern  . Not on file  Social History Narrative   Widower.   Step children.   Environmental health practitioner.   Enjoys riding hot air balloons, spending time with friends, reading, jewelry   Social Determinants of Health   Financial Resource Strain:   . Difficulty of Paying Living Expenses: Not on file  Food Insecurity:   . Worried About Charity fundraiser in the Last Year: Not on file  . Ran Out of Food in the Last Year: Not on file  Transportation Needs:   . Lack of Transportation (Medical): Not on file  . Lack of Transportation (Non-Medical): Not on file  Physical Activity:   . Days of Exercise per Week: Not on file  . Minutes of Exercise per Session: Not on file  Stress:   . Feeling of Stress : Not on file  Social Connections:   . Frequency of Communication with Friends and Family: Not on file  . Frequency of Social Gatherings with Friends and Family: Not on file  . Attends Religious Services: Not on file  . Active Member of Clubs or Organizations: Not on file  . Attends Archivist Meetings: Not on file  . Marital Status: Not on file  Intimate Partner Violence:   . Fear  of Current or Ex-Partner: Not on file  . Emotionally Abused: Not on file  . Physically Abused: Not on file  . Sexually Abused: Not on file    Past Surgical History:  Procedure Laterality Date  . BREAST BIOPSY  2010  . CARDIAC CATHETERIZATION  1998  . COLONOSCOPY  10-30-2004  . COLONOSCOPY WITH PROPOFOL N/A 04/01/2016   Procedure: COLONOSCOPY WITH PROPOFOL;  Surgeon: Robert Bellow, MD;  Location: [facility] ENDOSCOPY;  Service: Endoscopy;  Laterality: N/A;  . EYE SURGERY Bilateral    cataract sx  . TONSILLECTOMY      Family History  Problem Relation Age of Onset  . Stroke Mother   . Heart disease Father     Allergies  Allergen Reactions  . Antihistamines, Chlorpheniramine-Type   .  Ciprocin-Fluocin-Procin [Fluocinolone]   . Metformin And Related     Jittery, diarrhea  . Trulicity [Dulaglutide]     Current Outpatient Medications on File Prior to Visit  Medication Sig Dispense Refill  . b complex vitamins tablet Take 1 tablet by mouth daily.    Marland Kitchen BIOTIN PO Take 5 mg by mouth.    . Cholecalciferol (D3 HIGH POTENCY) 2000 units CAPS Take by mouth.    . fluconazole (DIFLUCAN) 100 MG tablet Take 100 mg by mouth daily.    . Insulin Pen Needle (B-D ULTRAFINE III SHORT PEN) 31G X 8 MM MISC USE NIGHTLY WITH INSULIN 100 each 1  . Insulin Pen Needle (PEN NEEDLES 5/16") 30G X 8 MM MISC Use nightly with insulin 100 each 2  . Krill Oil 300 MG CAPS Take by mouth.    . latanoprost (XALATAN) 0.005 % ophthalmic solution 1 drop at bedtime.    Marland Kitchen levothyroxine (SYNTHROID) 100 MCG tablet TAKE 1 TABLET BY MOUTH EVERY MORNING ON AN EMPTY STOMACH AND FULL GLASS OF WATER 90 tablet 1  . Magnesium 200 MG TABS Take by mouth.    . Menaquinone-7 (VITAMIN K2 PO) Take 19 mcg by mouth.    . metFORMIN (GLUCOPHAGE) 1000 MG tablet Take 1 tablet (1,000 mg total) by mouth 2 (two) times daily with a meal. For diabetes. 180 tablet 3  . rosuvastatin (CRESTOR) 10 MG tablet Take 1 tablet (10 mg total) by mouth daily. For cholesterol. 90 tablet 3  . VITAMIN A PO Take 2,400 mcg by mouth.     No current facility-administered medications on file prior to visit.    BP 124/82   Pulse 83   Temp (!) 97.4 F (36.3 C) (Temporal)   Ht 5\' 1"  (1.549 m)   Wt 216 lb 4 oz (98.1 kg)   SpO2 97%   BMI 40.86 kg/m    Objective:   Physical Exam  Constitutional: She appears well-nourished.  Cardiovascular: Normal rate and regular rhythm.  Respiratory: Effort normal and breath sounds normal.  Musculoskeletal:     Cervical back: Neck supple.  Skin: Skin is warm and dry.  Erythematous rash around oral cavity, no obvious lesions to oral cavity.  Psychiatric: She has a normal mood and affect.           Assessment  & Plan:

## 2020-02-17 LAB — TSH: TSH: 1.64 mIU/L (ref 0.40–4.50)

## 2020-02-17 LAB — CBC WITH DIFFERENTIAL/PLATELET
Absolute Monocytes: 809 cells/uL (ref 200–950)
Basophils Absolute: 105 cells/uL (ref 0–200)
Basophils Relative: 1 %
Eosinophils Absolute: 420 cells/uL (ref 15–500)
Eosinophils Relative: 4 %
HCT: 41.9 % (ref 35.0–45.0)
Hemoglobin: 14 g/dL (ref 11.7–15.5)
Lymphs Abs: 3791 cells/uL (ref 850–3900)
MCH: 31.3 pg (ref 27.0–33.0)
MCHC: 33.4 g/dL (ref 32.0–36.0)
MCV: 93.7 fL (ref 80.0–100.0)
MPV: 10.7 fL (ref 7.5–12.5)
Monocytes Relative: 7.7 %
Neutro Abs: 5376 cells/uL (ref 1500–7800)
Neutrophils Relative %: 51.2 %
Platelets: 278 10*3/uL (ref 140–400)
RBC: 4.47 10*6/uL (ref 3.80–5.10)
RDW: 12.4 % (ref 11.0–15.0)
Total Lymphocyte: 36.1 %
WBC: 10.5 10*3/uL (ref 3.8–10.8)

## 2020-02-17 LAB — ANA: Anti Nuclear Antibody (ANA): NEGATIVE

## 2020-02-21 ENCOUNTER — Ambulatory Visit: Payer: Medicare HMO | Admitting: Primary Care

## 2020-02-27 NOTE — Telephone Encounter (Signed)
Rosaria Ferries, I know you guys are swamped but can you take a look at her ENT referral?

## 2020-03-07 ENCOUNTER — Encounter (INDEPENDENT_AMBULATORY_CARE_PROVIDER_SITE_OTHER): Payer: Self-pay | Admitting: Otolaryngology

## 2020-03-07 ENCOUNTER — Other Ambulatory Visit: Payer: Self-pay

## 2020-03-07 ENCOUNTER — Ambulatory Visit (INDEPENDENT_AMBULATORY_CARE_PROVIDER_SITE_OTHER): Payer: Medicare HMO | Admitting: Otolaryngology

## 2020-03-07 VITALS — Temp 97.7°F

## 2020-03-07 DIAGNOSIS — J310 Chronic rhinitis: Secondary | ICD-10-CM

## 2020-03-07 DIAGNOSIS — Z8619 Personal history of other infectious and parasitic diseases: Secondary | ICD-10-CM | POA: Diagnosis not present

## 2020-03-07 NOTE — Progress Notes (Signed)
HPI: Valerie Gordon is a 69 y.o. female who presents is referred by her PCP for evaluation of chronic Candida.  She apparently has had chronic Candida that comes and goes for several months.  She has had a breakout on her face and was seen by dermatologist and treated with fluconazole for several weeks.  She has had previous history of vaginal Candida.  The breakout was much worse several months ago when she was not controlling her type 2 diabetes well and her hemoglobin A1c went up to 11.  Her last hemoglobin A1c was much better and was down to 7.  It has been back in her mouth also but is doing much better today although she still has some soreness on her lips.  She feels like the Candida comes and goes and she is doing better presently. She also complains of chronic postnasal mucus discharge from her nose.Marland Kitchen  Past Medical History:  Diagnosis Date  . Cataract   . Depression   . GERD (gastroesophageal reflux disease)   . Glaucoma   . Thyroid disease   . Type 2 diabetes mellitus ([place])    manages with diet and exercise   Past Surgical History:  Procedure Laterality Date  . BREAST BIOPSY  2010  . CARDIAC CATHETERIZATION  1998  . COLONOSCOPY  10-30-2004  . COLONOSCOPY WITH PROPOFOL N/A 04/01/2016   Procedure: COLONOSCOPY WITH PROPOFOL;  Surgeon: Robert Bellow, MD;  Location: [facility] ENDOSCOPY;  Service: Endoscopy;  Laterality: N/A;  . EYE SURGERY Bilateral    cataract sx  . TONSILLECTOMY     Social History   Socioeconomic History  . Marital status: Widowed    Spouse name: Not on file  . Number of children: Not on file  . Years of education: Not on file  . Highest education level: Not on file  Occupational History  . Not on file  Tobacco Use  . Smoking status: Never Smoker  . Smokeless tobacco: Never Used  Substance and Sexual Activity  . Alcohol use: Yes  . Drug use: No  . Sexual activity: Yes  Other Topics Concern  . Not on file  Social History Narrative   Widower.   Step  children.   Environmental health practitioner.   Enjoys riding hot air balloons, spending time with friends, reading, jewelry   Social Determinants of Health   Financial Resource Strain:   . Difficulty of Paying Living Expenses:   Food Insecurity:   . Worried About Charity fundraiser in the Last Year:   . Arboriculturist in the Last Year:   Transportation Needs:   . Film/video editor (Medical):   Marland Kitchen Lack of Transportation (Non-Medical):   Physical Activity:   . Days of Exercise per Week:   . Minutes of Exercise per Session:   Stress:   . Feeling of Stress :   Social Connections:   . Frequency of Communication with Friends and Family:   . Frequency of Social Gatherings with Friends and Family:   . Attends Religious Services:   . Active Member of Clubs or Organizations:   . Attends Archivist Meetings:   Marland Kitchen Marital Status:    Family History  Problem Relation Age of Onset  . Stroke Mother   . Heart disease Father    Allergies  Allergen Reactions  . Antihistamines, Chlorpheniramine-Type   . Ciprocin-Fluocin-Procin [Fluocinolone]   . Metformin And Related     Jittery, diarrhea  . Trulicity [Dulaglutide]  Prior to Admission medications   Medication Sig Start Date End Date Taking? Authorizing Provider  b complex vitamins tablet Take 1 tablet by mouth daily.   Yes [provider]  BIOTIN PO Take 5 mg by mouth.   Yes [provider]  Cholecalciferol (D3 HIGH POTENCY) 2000 units CAPS Take by mouth.   Yes [provider]  fluconazole (DIFLUCAN) 100 MG tablet Take 100 mg by mouth daily.   Yes [provider]  Insulin Glargine (BASAGLAR KWIKPEN) 100 UNIT/ML SOPN Inject 0.2 mLs (20 Units total) into the skin 2 (two) times daily. 02/16/20  Yes Pleas Koch, NP  Insulin Pen Needle (B-D ULTRAFINE III SHORT PEN) 31G X 8 MM MISC USE NIGHTLY WITH INSULIN 01/29/20  Yes Pleas Koch, NP  Insulin Pen Needle (PEN NEEDLES 5/16") 30G X 8  MM MISC Use nightly with insulin 01/26/20  Yes Pleas Koch, NP  Krill Oil 300 MG CAPS Take by mouth.   Yes [provider]  latanoprost (XALATAN) 0.005 % ophthalmic solution 1 drop at bedtime.   Yes [provider]  levothyroxine (SYNTHROID) 100 MCG tablet TAKE 1 TABLET BY MOUTH EVERY MORNING ON AN EMPTY STOMACH AND FULL GLASS OF WATER 10/12/19  Yes Pleas Koch, NP  Magnesium 200 MG TABS Take by mouth.   Yes [provider]  Menaquinone-7 (VITAMIN K2 PO) Take 19 mcg by mouth.   Yes [provider]  metFORMIN (GLUCOPHAGE) 1000 MG tablet Take 1 tablet (1,000 mg total) by mouth 2 (two) times daily with a meal. For diabetes. 01/22/20  Yes Pleas Koch, NP  rosuvastatin (CRESTOR) 10 MG tablet Take 1 tablet (10 mg total) by mouth daily. For cholesterol. 04/17/19  Yes Pleas Koch, NP  VITAMIN A PO Take 2,400 mcg by mouth.   Yes [provider]     Positive ROS: Otherwise negative  All other systems have been reviewed and were otherwise negative with the exception of those mentioned in the HPI and as above.  Physical Exam: Constitutional: Alert, well-appearing, no acute distress Ears: External ears without lesions or tenderness. Ear canals are clear bilaterally with intact, clear TMs.  Nasal: External nose without lesions. Septum slight deformity to the left..  Mild rhinitis with clear mucus discharge.  Both middle meatus regions are clear with no signs of active infection or mucopurulent discharge. Oral: She has mild erythema of the lips and gums but really on clinical exam no gross evidence of active Candida.  Buccal mucosa is clear.  Palate mucosa is clear.  Posterior oropharyngeal mucosa is clear with no exudate or erythema and no gross Candida or thrush observed. Neck: No palpable adenopathy or masses Respiratory: Breathing comfortably  Skin: She does have some mild erythema of her cheek  skin.  Procedures  Assessment: Apparent history of intraoral Candida that apparently affects her facial skin. Mild chronic rhinitis  Plan: For the nose and postnasal drainage suggested using Nasacort 2 sprays each nostril at night. Recommended good control or better control of her diabetes.  She is doing better with this presently. If she has a flareup in her mouth suggested taking nystatin suspension and rinse with 5 mL 4 times daily for 1 week. She will follow-up if symptoms worsen as needed.   Radene Journey, MD   CC:

## 2020-03-18 ENCOUNTER — Encounter: Payer: Self-pay | Admitting: Internal Medicine

## 2020-03-18 ENCOUNTER — Ambulatory Visit (INDEPENDENT_AMBULATORY_CARE_PROVIDER_SITE_OTHER): Payer: Medicare HMO | Admitting: Internal Medicine

## 2020-03-18 ENCOUNTER — Other Ambulatory Visit: Payer: Self-pay

## 2020-03-18 VITALS — BP 124/84 | HR 98 | Temp 97.5°F | Wt 216.0 lb

## 2020-03-18 DIAGNOSIS — R81 Glycosuria: Secondary | ICD-10-CM

## 2020-03-18 DIAGNOSIS — R300 Dysuria: Secondary | ICD-10-CM

## 2020-03-18 DIAGNOSIS — E119 Type 2 diabetes mellitus without complications: Secondary | ICD-10-CM | POA: Diagnosis not present

## 2020-03-18 LAB — POC URINALSYSI DIPSTICK (AUTOMATED)
Bilirubin, UA: NEGATIVE
Blood, UA: NEGATIVE
Glucose, UA: POSITIVE — AB
Leukocytes, UA: NEGATIVE
Nitrite, UA: NEGATIVE
Protein, UA: NEGATIVE
Spec Grav, UA: 1.02 (ref 1.010–1.025)
Urobilinogen, UA: 0.2 E.U./dL
pH, UA: 6 (ref 5.0–8.0)

## 2020-03-18 LAB — GLUCOSE, POCT (MANUAL RESULT ENTRY): POC Glucose: 345 mg/dl — AB (ref 70–99)

## 2020-03-18 NOTE — Patient Instructions (Signed)

## 2020-03-18 NOTE — Progress Notes (Signed)
HPI  Pt presents to the clinic today with c/o pelvic pain and dysuria. This started 2 weeks ago but worsened 2 days ago. She denies urinary urgency, frequency or blood in her urine. She denies fever, chills, nausea or low back pain. She denies vaginal complaints. She does have a history of DM 2, last A1C was 7.2 %.   Review of Systems  Past Medical History:  Diagnosis Date  . Cataract   . Depression   . GERD (gastroesophageal reflux disease)   . Glaucoma   . Thyroid disease   . Type 2 diabetes mellitus ([place])    manages with diet and exercise    Family History  Problem Relation Age of Onset  . Stroke Mother   . Heart disease Father     Social History   Socioeconomic History  . Marital status: Widowed    Spouse name: Not on file  . Number of children: Not on file  . Years of education: Not on file  . Highest education level: Not on file  Occupational History  . Not on file  Tobacco Use  . Smoking status: Never Smoker  . Smokeless tobacco: Never Used  Substance and Sexual Activity  . Alcohol use: Yes  . Drug use: No  . Sexual activity: Yes  Other Topics Concern  . Not on file  Social History Narrative   Widower.   Step children.   Environmental health practitioner.   Enjoys riding hot air balloons, spending time with friends, reading, jewelry   Social Determinants of Health   Financial Resource Strain:   . Difficulty of Paying Living Expenses:   Food Insecurity:   . Worried About Charity fundraiser in the Last Year:   . Arboriculturist in the Last Year:   Transportation Needs:   . Film/video editor (Medical):   Marland Kitchen Lack of Transportation (Non-Medical):   Physical Activity:   . Days of Exercise per Week:   . Minutes of Exercise per Session:   Stress:   . Feeling of Stress :   Social Connections:   . Frequency of Communication with Friends and Family:   . Frequency of Social Gatherings with Friends and Family:   . Attends Religious Services:   . Active  Member of Clubs or Organizations:   . Attends Archivist Meetings:   Marland Kitchen Marital Status:   Intimate Partner Violence:   . Fear of Current or Ex-Partner:   . Emotionally Abused:   Marland Kitchen Physically Abused:   . Sexually Abused:     Allergies  Allergen Reactions  . Antihistamines, Chlorpheniramine-Type   . Ciprocin-Fluocin-Procin [Fluocinolone]   . Metformin And Related     Jittery, diarrhea  . Trulicity [Dulaglutide]      Constitutional: Denies fever, malaise, fatigue, headache or abrupt weight changes.   GU: Pt reports pain with urination. Denies urinary urgency, frequency, burning sensation, blood in urine, odor or discharge. Skin: Denies redness, rashes, lesions or ulcercations.   No other specific complaints in a complete review of systems (except as listed in HPI above).    Objective:   Physical Exam  BP 124/84   Pulse 98   Temp (!) 97.5 F (36.4 C) (Temporal)   Wt 216 lb (98 kg)   SpO2 98%   BMI 40.81 kg/m   Wt Readings from Last 3 Encounters:  02/16/20 216 lb 4 oz (98.1 kg)  12/11/19 210 lb (95.3 kg)  10/30/19 208 lb 9 oz (94.6  kg)    General: Appears her stated age, obese in NAD. Cardiovascular: Normal rate and rhythm. S1,S2 noted.   Pulmonary/Chest: Normal effort and positive vesicular breath sounds. No respiratory distress. No wheezes, rales or ronchi noted.  Abdomen: Soft, nontender. Normal bowel sounds. No distention or masses noted.  No CVA tenderness.        Assessment & Plan:   Dysuria:  Urinalysis: 3+ glucose, +ketones Will send urine culture CBG 345- discussed the importance of blood pressure control Drink plenty of fluids  RTC as needed or if symptoms persist. Webb Silversmith, NP This visit occurred during the SARS-CoV-2 public health emergency.  Safety protocols were in place, including screening questions prior to the visit, additional usage of staff PPE, and extensive cleaning of exam room while observing appropriate contact time as  indicated for disinfecting solutions.

## 2020-03-18 NOTE — Telephone Encounter (Signed)
Received a triage message that patient would like medication for a UTI. Tried to call patient back and got her voicemail. Left a message for patient to call and schedule an appointment. Will also send back a mycharg message to patient.

## 2020-03-19 ENCOUNTER — Ambulatory Visit (INDEPENDENT_AMBULATORY_CARE_PROVIDER_SITE_OTHER): Payer: Medicare HMO | Admitting: Family Medicine

## 2020-03-19 DIAGNOSIS — E669 Obesity, unspecified: Secondary | ICD-10-CM

## 2020-03-19 DIAGNOSIS — E119 Type 2 diabetes mellitus without complications: Secondary | ICD-10-CM

## 2020-03-19 NOTE — Patient Instructions (Addendum)
   Diet Recommendations for Diabetes  Carbohydrate includes starch, sugar, and fiber.  Of these, only sugar and starch raise blood glucose.  (Fiber is found in fruits, vegetables [especially skin, seeds, and stalks], whole grains, and beans.)   Starchy (carb) foods: Bread, rice, pasta, potatoes, corn, cereal, grits, crackers, bagels, muffins, all baked goods.  (Fruit, milk, and yogurt also have carbohydrate, but most of these foods will not spike your blood sugar as most starchy foods will.)  A few fruits do cause high blood sugars; use small portions of bananas (limit to 1/2 at a time), grapes, watermelon, oranges, and most tropical fruits.   Protein foods: Meat, fish, poultry, eggs, dairy foods, and beans such as pinto and kidney beans (beans also provide carbohydrate).   1. Eat at least 3 REAL meals and at least 1 snack per day. Eat breakfast within the first hour of getting up.  Have something to eat at least every 5 hours while awake.   2. Limit starchy foods to TWO per meal and ONE per snack. ONE portion of a starchy food is equal to the following:   - ONE slice of bread (or its equivalent, such as half of a hamburger bun).   - 1/2 cup of a "scoopable" starchy food such as potatoes or rice.   - 15 grams of Total Carbohydrate as shown on food label.   - Every 4 ounces of a sweet drink (including fruit juice). 3. Include at every lunch and dinner: a protein food, a carb food, and vegetables.   - Obtain twice the volume of veg's as protein or carbohydrate foods for both lunch and dinner.   - Fresh or frozen vegetables are best.   - Keep frozen vegetables on hand for a quick option.      - When you use canned soup, first microwave frozen or fresh veg's, then add soup, and reheat.    Exercise goal: Do at least 5 minutes 5 days a week.    Document how many minutes you exercise.    Note:  Switching from heavy cream to whole milk in your coffee would mean getting 96 calories vs. 540 if you use 2/3  cup.  (The recommendation you received to avoid milk b/c of its carb content is unfounded in my opinion.)  Follow-up: Telehealth appt via MyChart on Tuesday, April 13 at 4 PM.    Be sure to talk with Alma Friendly about a referral in EPIC:  She needs to specify [place] when referring to me.

## 2020-03-19 NOTE — Progress Notes (Signed)
Telehealth Encounter PCP Alma Friendly, NP, [place], [place] I connected with Valerie Gordon (MRN [id number]) on 03/19/2020 by MyChart video-enabled, HIPAA-compliant telemedicine application, verified that I am speaking with the correct person using two identifiers, and that the patient was in a private environment conducive to confidentiality.  The patient agreed to proceed.  Provider was Kennith Center, PhD, RD, LDN, CEDRD Provider was located at [facility] during this telehealth encounter; patient was at home  Appt start time: 1630 end time: 1730 (1 hour)  Reason for telehealth visit: Medical Nutrition Therapy for management of blood glucose and weight. Relevant history/background: [name] was seen in 2019 by me for MNT.  At that time she preferred to follow a keto-type diet, and I had provided dietary guidance in this process, suggesting she consume more protein and less fat, consistently incorporate physical activity, and to document daily FBG levels.  She is no longer using a keto diet, and wants help in determining a sustainable and effective approach for her to manage BG and weight.   Assessment: [name]'s main objective is to lose weight.  She hasn't gained a lot of weight, but during the last year her BG "got out of control" - A1C of 11; down to a 7 last month.  She has tried numerous ways to lose weight other than to exercise, but she said nothing seems to work.  It has especially been challenging for [name] to stay with a routine.  Lives alone M-F, joined by her significant other on weekends.  Usual eating pattern: Small breakfast (e.g., toast or 1 egg), lunch and dinner and 0-1 snack per day. Frequent foods and beverages: 2 c coffee with ~1/3 c cream in each cup, water; sandwiches, deli meats, canned soup, veg's at dinner.  Tends to do repeated foods for a week at a time.   Usual physical activity: None.  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   Sleep: Wakes frequently.  Drinks ~32 oz water through the night.  Estimates she gets ~7-8 hours sleep/night.   24-hr recall:  (Up at 7 AM) B (7:30 AM)-  1 hb'd egg, 2 c coffee, 2/3 c heavy cream (=540 kcal) Snk ( AM)-  --- L (12:30 PM)-  1 bacon chsbrgr, 2 tbsp mayo, let, tom, water Snk ( PM)-  --- D (6 PM)-  1 bbq sandwich, 1/3 c slaw, 1 tbsp mayo, water Snk ( PM)-  --- Typical day? No. Seldom eats restaurant/takeout foods, but was out most of the day.  Usually drinks ~64 oz water during the day and 32 oz during the night d/t dry mouth.    Intervention: Reviewed recent diet and physical activity history, and re-established behavioral goals.    For recommendations and goals, see Patient Instructions.    Follow-up: Telehealth visit via MyChart in 3 weeks.    Nephtali Docken,JEANNIE

## 2020-03-19 NOTE — Telephone Encounter (Signed)
Valerie Gordon, will you change her appointment scheduled for tomorrow to Friday at 11:20. Virtual, we will actually see her at 12 pm. Please put in 11:20 slot.

## 2020-03-20 ENCOUNTER — Encounter: Payer: Self-pay | Admitting: Internal Medicine

## 2020-03-20 ENCOUNTER — Other Ambulatory Visit: Payer: Self-pay | Admitting: Primary Care

## 2020-03-20 ENCOUNTER — Ambulatory Visit: Payer: Medicare HMO | Admitting: Primary Care

## 2020-03-20 DIAGNOSIS — E119 Type 2 diabetes mellitus without complications: Secondary | ICD-10-CM

## 2020-03-20 LAB — URINE CULTURE
MICRO NUMBER:: 10277041
SPECIMEN QUALITY:: ADEQUATE

## 2020-03-20 MED ORDER — NITROFURANTOIN MONOHYD MACRO 100 MG PO CAPS: 100.0000 mg | ORAL_CAPSULE | Freq: Two times a day (BID) | ORAL | 0 refills | Status: DC

## 2020-03-20 NOTE — Addendum Note (Signed)
Addended by: Jearld Fenton on: 03/20/2020 01:36 PM   Modules accepted: Orders

## 2020-03-22 ENCOUNTER — Telehealth (INDEPENDENT_AMBULATORY_CARE_PROVIDER_SITE_OTHER): Payer: Medicare HMO | Admitting: Primary Care

## 2020-03-22 ENCOUNTER — Other Ambulatory Visit: Payer: Self-pay

## 2020-03-22 DIAGNOSIS — E119 Type 2 diabetes mellitus without complications: Secondary | ICD-10-CM | POA: Diagnosis not present

## 2020-03-22 NOTE — Assessment & Plan Note (Signed)
Glucose readings spiking after holding Metformin 1000 mg BID for symptoms of dry mouth. Dry mouth improved without metformin.  Will resume metformin but at the 500 mg BID dose, she had no problems with this dose. Continue Basaglar 35 units in the AM and 36 in the PM, she will dose accordingly to her glucose readings.  Continue follow up with the nutritionist, encouraged regular exercise.  Repeat A1C in 3 months.

## 2020-03-22 NOTE — Progress Notes (Signed)
Subjective:    Patient ID: Valerie Gordon, female    DOB: Jul 31, 1951, 69 y.o.   MRN: [id number]  HPI  Virtual Visit via Video Note  I connected with Valerie Gordon on 03/22/20 at 11:20 AM EDT by a video enabled telemedicine application and verified that I am speaking with the correct person using two identifiers.  Location: Patient: Home Provider: Office   I discussed the limitations of evaluation and management by telemedicine and the availability of in person appointments. The patient expressed understanding and agreed to proceed.  History of Present Illness:  Valerie Gordon is a 69 year old female with a history of uncontrolled diabetes, hypothyroidism, GERD, recurrent yeast skin infection, hyperlipidemia who presents today for follow up.  She was recently evaluated by Webb Silversmith for UTI symptoms, diagnosed with acute cystitis with E coli positive culture. Blood sugar that day was noted to be 345. Also with recent home readings in the 200's. Metformin 100 mg BID was discontinued a few days prior due to symptoms of dry mouth. One month prior A1C was 7.2 which was a significant improvement from 10.8 in November 2020.  She is compliant to [place], is now up to 35 units in the AM and 36 units HS, she continues to remain off of Metformin. She has noticed an improvement in her dry mouth since holding Metformin for the last several days. She doesn't note any problems with dry mouth when taking Metformin 500 mg BID.  She is checking her blood sugars once daily and is getting readings of mid 200's in the morning fasting. She had her first diabetic nutritionist appointment this week, feels encouraged and is working on changing her diet and exercising.  She is compliant to her oral antibiotics for acute cystitis, starting to feel better.    Observations/Objective:  Alert and oriented. Appears well, not sickly. No distress. Speaking in complete sentences.   Assessment and Plan:  See  problem based charting.  Follow Up Instructions:  Resume Metformin at 500 mg twice daily as discussed.   Continue the Basaglar insulin as discussed.  Start exercising daily, work on [employer].  Schedule a lab appointment for mid May for A1C check.  We will see you in August for your physical.  Update me in one week.  It was a pleasure to see you today! Allie Bossier, NP-C    I discussed the assessment and treatment plan with the patient. The patient was provided an opportunity to ask questions and all were answered. The patient agreed with the plan and demonstrated an understanding of the instructions.   The patient was advised to call back or seek an in-person evaluation if the symptoms worsen or if the condition fails to improve as anticipated.   Pleas Koch, NP    Review of Systems  Eyes: Negative for visual disturbance.  Respiratory: Negative for shortness of breath.   Cardiovascular: Negative for chest pain.  Neurological: Negative for dizziness and headaches.       Past Medical History:  Diagnosis Date  . Cataract   . Depression   . GERD (gastroesophageal reflux disease)   . Glaucoma   . Thyroid disease   . Type 2 diabetes mellitus ([place])    manages with diet and exercise     Social History   Socioeconomic History  . Marital status: Widowed    Spouse name: Not on file  . Number of children: Not on file  . Years of education: Not  on file  . Highest education level: Not on file  Occupational History  . Not on file  Tobacco Use  . Smoking status: Never Smoker  . Smokeless tobacco: Never Used  Substance and Sexual Activity  . Alcohol use: Yes  . Drug use: No  . Sexual activity: Yes  Other Topics Concern  . Not on file  Social History Narrative   Widower.   Step children.   Environmental health practitioner.   Enjoys riding hot air balloons, spending time with friends, reading, jewelry   Social Determinants of Health   Financial Resource  Strain:   . Difficulty of Paying Living Expenses:   Food Insecurity:   . Worried About Charity fundraiser in the Last Year:   . Arboriculturist in the Last Year:   Transportation Needs:   . Film/video editor (Medical):   Marland Kitchen Lack of Transportation (Non-Medical):   Physical Activity:   . Days of Exercise per Week:   . Minutes of Exercise per Session:   Stress:   . Feeling of Stress :   Social Connections:   . Frequency of Communication with Friends and Family:   . Frequency of Social Gatherings with Friends and Family:   . Attends Religious Services:   . Active Member of Clubs or Organizations:   . Attends Archivist Meetings:   Marland Kitchen Marital Status:   Intimate Partner Violence:   . Fear of Current or Ex-Partner:   . Emotionally Abused:   Marland Kitchen Physically Abused:   . Sexually Abused:     Past Surgical History:  Procedure Laterality Date  . BREAST BIOPSY  2010  . CARDIAC CATHETERIZATION  1998  . COLONOSCOPY  10-30-2004  . COLONOSCOPY WITH PROPOFOL N/A 04/01/2016   Procedure: COLONOSCOPY WITH PROPOFOL;  Surgeon: Robert Bellow, MD;  Location[facility] ENDOSCOPY;  Service: Endoscopy;  Laterality: N/A;  . EYE SURGERY Bilateral    cataract sx  . TONSILLECTOMY      Family History  Problem Relation Age of Onset  . Stroke Mother   . Heart disease Father     Allergies  Allergen Reactions  . Antihistamines, Chlorpheniramine-Type   . Ciprocin-Fluocin-Procin [Fluocinolone]   . Metformin And Related     Jittery, diarrhea  . Trulicity [Dulaglutide]     Current Outpatient Medications on File Prior to Visit  Medication Sig Dispense Refill  . b complex vitamins tablet Take 1 tablet by mouth daily.    Marland Kitchen BIOTIN PO Take 5 mg by mouth.    . Cholecalciferol (D3 HIGH POTENCY) 2000 units CAPS Take by mouth.    . fluconazole (DIFLUCAN) 100 MG tablet Take 100 mg by mouth daily.    . Insulin Glargine (BASAGLAR KWIKPEN) 100 UNIT/ML SOPN Inject 0.2 mLs (20 Units total) into the skin 2  (two) times daily. 15 mL 2  . Insulin Pen Needle (B-D ULTRAFINE III SHORT PEN) 31G X 8 MM MISC USE NIGHTLY WITH INSULIN 100 each 1  . Insulin Pen Needle (PEN NEEDLES 5/16") 30G X 8 MM MISC Use nightly with insulin 100 each 2  . Krill Oil 300 MG CAPS Take by mouth.    . latanoprost (XALATAN) 0.005 % ophthalmic solution 1 drop at bedtime.    Marland Kitchen levothyroxine (SYNTHROID) 100 MCG tablet TAKE 1 TABLET BY MOUTH EVERY MORNING ON AN EMPTY STOMACH AND FULL GLASS OF WATER 90 tablet 1  . Magnesium 200 MG TABS Take by mouth.    Berneice Heinrich (  VITAMIN K2 PO) Take 19 mcg by mouth.    . metFORMIN (GLUCOPHAGE) 1000 MG tablet Take 1 tablet (1,000 mg total) by mouth 2 (two) times daily with a meal. For diabetes. 180 tablet 3  . nitrofurantoin, macrocrystal-monohydrate, (MACROBID) 100 MG capsule Take 1 capsule (100 mg total) by mouth 2 (two) times daily. 10 capsule 0  . rosuvastatin (CRESTOR) 10 MG tablet Take 1 tablet (10 mg total) by mouth daily. For cholesterol. 90 tablet 3  . VITAMIN A PO Take 2,400 mcg by mouth.     No current facility-administered medications on file prior to visit.    There were no vitals taken for this visit.   Objective:   Physical Exam  Constitutional: She is oriented to person, place, and time. She appears well-nourished.  Respiratory: Effort normal.  Neurological: She is alert and oriented to person, place, and time.  Psychiatric: She has a normal mood and affect.           Assessment & Plan:

## 2020-03-22 NOTE — Patient Instructions (Signed)
Resume Metformin at 500 mg twice daily as discussed.   Continue the Basaglar insulin as discussed.  Start exercising daily, work on [employer].  Schedule a lab appointment for mid May for A1C check.  We will see you in August for your physical.  Update me in one week.  It was a pleasure to see you today! Allie Bossier, NP-C

## 2020-03-25 DIAGNOSIS — L918 Other hypertrophic disorders of the skin: Secondary | ICD-10-CM | POA: Diagnosis not present

## 2020-03-25 DIAGNOSIS — C44519 Basal cell carcinoma of skin of other part of trunk: Secondary | ICD-10-CM | POA: Diagnosis not present

## 2020-03-25 DIAGNOSIS — D485 Neoplasm of uncertain behavior of skin: Secondary | ICD-10-CM | POA: Diagnosis not present

## 2020-03-25 DIAGNOSIS — L578 Other skin changes due to chronic exposure to nonionizing radiation: Secondary | ICD-10-CM | POA: Diagnosis not present

## 2020-03-25 DIAGNOSIS — B372 Candidiasis of skin and nail: Secondary | ICD-10-CM | POA: Diagnosis not present

## 2020-03-25 DIAGNOSIS — Z872 Personal history of diseases of the skin and subcutaneous tissue: Secondary | ICD-10-CM | POA: Diagnosis not present

## 2020-04-09 ENCOUNTER — Other Ambulatory Visit: Payer: Self-pay | Admitting: Primary Care

## 2020-04-09 ENCOUNTER — Ambulatory Visit (INDEPENDENT_AMBULATORY_CARE_PROVIDER_SITE_OTHER): Payer: Medicare HMO | Admitting: Family Medicine

## 2020-04-09 ENCOUNTER — Other Ambulatory Visit: Payer: Self-pay

## 2020-04-09 DIAGNOSIS — E669 Obesity, unspecified: Secondary | ICD-10-CM

## 2020-04-09 DIAGNOSIS — C44519 Basal cell carcinoma of skin of other part of trunk: Secondary | ICD-10-CM | POA: Diagnosis not present

## 2020-04-09 DIAGNOSIS — E039 Hypothyroidism, unspecified: Secondary | ICD-10-CM

## 2020-04-09 DIAGNOSIS — E119 Type 2 diabetes mellitus without complications: Secondary | ICD-10-CM | POA: Diagnosis not present

## 2020-04-09 DIAGNOSIS — Z794 Long term (current) use of insulin: Secondary | ICD-10-CM | POA: Diagnosis not present

## 2020-04-09 DIAGNOSIS — E785 Hyperlipidemia, unspecified: Secondary | ICD-10-CM

## 2020-04-09 NOTE — Telephone Encounter (Signed)
FYI regarding urine lab appt.

## 2020-04-09 NOTE — Patient Instructions (Addendum)
Great job on the many changes you have made!   Goals to focus on now:  1. Add 1 minute of exercise to your elliptical workout each week, getting a workout at least 6 days a week.     2. Try at least one new vegetable 3 times a week.   3. For every lunch and dinner, obtain twice the volume of vegetables as either protein or starchy foods for both lunch and dinner.  Document progress on your goals [facility] DAY, using a notebook.  Try out the Meal Planning form provided today.        Follow-up telehealth appt via [place] on Monday, May 10 at 4 PM.

## 2020-04-09 NOTE — Progress Notes (Signed)
Telehealth Encounter PCP Alma Friendly, NP, [place], [place] I connected with BOSTON SCHIED (MRN [id number]) on 04/09/2020 by MyChart video-enabled, HIPAA-compliant telemedicine application, verified that I am speaking with the correct person using two identifiers, and that the patient was in a private environment conducive to confidentiality.  The patient agreed to proceed.  Provider was Kennith Center, PhD, RD, LDN, CEDRD Provider was located at [facility] during this telehealth encounter; patient was at home  Appt start time: 1600 end time: 1700 (1 hour)  Reason for telehealth visit: Medical Nutrition Therapy for management of blood glucose and weight. Relevant history/background: Sophialynn was seen in 2019 by me for MNT.  At that time she preferred to follow a keto-type diet, and I had provided dietary guidance in this process, suggesting she consume more protein and less fat, consistently incorporate physical activity, and to document daily FBG levels.  She is no longer using a keto diet, and wants help in determining a sustainable and effective approach for her to manage BG and weight.   Assessment: Izzabella has started taking her levothryroxine in the morning, and has felt more energetic since then.  She has restocked her kitchen with fresh and frozen fruit and frozen veg's, and she has been getting 3 real meals and 2 snacks per day.  She has reduced her metformin by half (now 500 mg bid) to help alleviate dry mouth.  She expressed some frustration at no weight loss yet, but has been feeling better.   Recent eating pattern: 3 meals and 2 snacks per day.   Recent physical activity: 5 min elliptical each day at 4 PM after work.  Krys bought an Apple watch, which she has been using to track exercise and other data.   Sleep: Estimates ~7-8 hours sleep/night.  Up twice a night; still drinks ~32 oz water through the night.   24-hr recall:  (Up at 6:30 AM) B (6:45 AM)-  2 c  coffee each with 4 oz whole milk, 1 tbsp cream B (8:30)-  1/3 c Cheerio-like cereal (5 g CHO), 1/2 c frozen fruit, 1/2 c whole milk Snk (9:30 AM)-1 devilled egg, water  L (11:30 PM)-  1 small chx brst, 1/3 c spinach, 1/3 c brn rice, water Snk (12 PM)-  1/2 grapefruit Snk (3 PM)-  1 1/2 tbsp peanut butter, 3 Ritz crackers, water D (7 PM)-  1/3 c spagh , 1/2 c meat sauce, 2 tbsp parmesan chs, water Snk (9 PM)-  4-5 apple slices Typical day? Yes.  Although usually has more veg's.    Intervention: Reviewed recent diet and physical activity history, and emphasized behavioral goals related to increasing vegetable intake and exercise.    For recommendations and goals, see Patient Instructions.    Follow-up: Telehealth visit via MyChart in 4 weeks.    Fleta Borgeson,JEANNIE

## 2020-04-24 DIAGNOSIS — E039 Hypothyroidism, unspecified: Secondary | ICD-10-CM

## 2020-04-24 MED ORDER — SYNTHROID 100 MCG PO TABS: ORAL_TABLET | ORAL | 0 refills | Status: DC

## 2020-05-06 ENCOUNTER — Encounter: Payer: Medicare HMO | Admitting: Family Medicine

## 2020-05-06 ENCOUNTER — Other Ambulatory Visit: Payer: Self-pay

## 2020-05-06 NOTE — Progress Notes (Signed)
This encounter was created in error - please disregard.

## 2020-05-23 ENCOUNTER — Telehealth: Payer: Medicare HMO | Admitting: Family Medicine

## 2020-05-30 ENCOUNTER — Ambulatory Visit (INDEPENDENT_AMBULATORY_CARE_PROVIDER_SITE_OTHER): Payer: Medicare HMO | Admitting: Family Medicine

## 2020-05-30 ENCOUNTER — Other Ambulatory Visit: Payer: Self-pay

## 2020-05-30 DIAGNOSIS — E669 Obesity, unspecified: Secondary | ICD-10-CM

## 2020-05-30 DIAGNOSIS — E119 Type 2 diabetes mellitus without complications: Secondary | ICD-10-CM | POA: Diagnosis not present

## 2020-05-30 NOTE — Patient Instructions (Addendum)
Goals:  1. Add 1 minute of exercise to your elliptical workout each week, getting a workout at least 6 days a week.         In addition, move (not just stand) at least 5 minutes for every 60 minutes that you sit.       Wear good supportive shoes!  Elevate your feet in the evenings.  2. For every lunch and dinner, obtain twice the volume of vegetables as either protein or starchy foods for both lunch and dinner.     Pay attention to your satisfaction, and consider branching out to some new vegetables if you start getting tired of some.      Try out the Meal Planning form provided previously.   3. Bedtime by 11 PM at least 4 X wk.    Document progress on your goals [facility] DAY, using a notebook.  Email a progress report in 2-3 weeks.    Book recommendation:  Hooked by Salvatore Marvel.   Follow-up: Telehealth visit via [place] on July 15 at 4 PM.

## 2020-05-30 NOTE — Progress Notes (Signed)
Telehealth Encounter PCP Alma Friendly, NP, [place], [place] I connected with Valerie Gordon (MRN [id number]) on 05/30/2020 by MyChart video-enabled, HIPAA-compliant telemedicine application, verified that I am speaking with the correct person using two identifiers, and that the patient was in a private environment conducive to confidentiality.  The patient agreed to proceed.  Provider was Kennith Center, PhD, RD, LDN, CEDRD Provider was located at [facility] during this telehealth encounter; patient was at home  Appt start time: 1000 end time: 1100 (1 hour)  Reason for telehealth visit: Medical Nutrition Therapy for management of blood glucose and weight.  Relevant history/background: Valerie Gordon was seen in 2019 by me for MNT.  At that time she preferred to follow a keto-type diet, and I had provided dietary guidance in this process, suggesting she consume more protein and less fat, consistently incorporate physical activity, and to document daily FBG levels.  She is no longer using a keto diet, and wants help in determining a sustainable and effective approach for her to manage BG and weight.   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  This seems to have derailed [name]'s other efforts at self-care - consistently taking her med's, journalling, and making thoughtful food choices.  She has gotten back on track this week, following a weekend trip for an air balloon event, which she enjoyed immensely, but also found challenging b/c of all the walking involved.  She participated in the online [place] behavior change class taught in May.  She said what she most learned from that class was the information on practicing self-compassion and self-kindness.  She also now recognizes that behavior change requires a conscious analysis of what her barriers are; she's  identified one obstacle to following through with her intentions is not getting to bed early enough to get the sleep she needs.   Recent eating pattern: 3 meals and 2 snacks per day.   FBG last 3 days: 129-149; levels had been up 160 to mid-200s prior to last week's trip, but down to ~120 during trip last weekend, likely d/t increased activity.  Recent physical activity: Restarted 5 min elliptical each day at 4 PM after work, and has also started walking up and down the 10 steps on her back-yard deck several times a day.   Sleep: Estimates ~7-8 hours sleep/night.  Getting up once a night; now drinks less than 8 oz water through the night.  Getting to bed by 11 PM only 2-3 X wk.    Intervention: Reviewed recent diet and physical activity history, and re-established behavioral goals.    For recommendations and goals, see Patient Instructions.    Follow-up: Telehealth visit via MyChart in 6 weeks.    Brae Gartman,JEANNIE

## 2020-07-11 ENCOUNTER — Ambulatory Visit (INDEPENDENT_AMBULATORY_CARE_PROVIDER_SITE_OTHER): Payer: Medicare HMO | Admitting: Family Medicine

## 2020-07-11 ENCOUNTER — Other Ambulatory Visit: Payer: Self-pay | Admitting: Primary Care

## 2020-07-11 DIAGNOSIS — E119 Type 2 diabetes mellitus without complications: Secondary | ICD-10-CM

## 2020-07-11 DIAGNOSIS — E039 Hypothyroidism, unspecified: Secondary | ICD-10-CM

## 2020-07-11 DIAGNOSIS — E785 Hyperlipidemia, unspecified: Secondary | ICD-10-CM

## 2020-07-11 DIAGNOSIS — E669 Obesity, unspecified: Secondary | ICD-10-CM | POA: Diagnosis not present

## 2020-07-11 NOTE — Progress Notes (Signed)
Telehealth Encounter PCP Alma Friendly, NP, [place], [place] I connected with JASHIRA COTUGNO (MRN [id number]) on 07/11/2020 by MyChart video-enabled, HIPAA-compliant telemedicine application, verified that I am speaking with the correct person using two identifiers, and that the patient was in a private environment conducive to confidentiality.  The patient agreed to proceed.  Provider was Kennith Center, PhD, RD, LDN, CEDRD Provider was located at [facility] during this telehealth encounter; patient was at home  Appt start time: 1530 end time: 1630 (1 hour)  Reason for telehealth visit: Medical Nutrition Therapy for management of blood glucose and weight.  Relevant history/background: Jaeleah was seen in 2019 by me for MNT.  At that time she preferred to follow a keto-type diet, and I had provided dietary guidance in this process, suggesting she consume more protein and less fat, consistently incorporate physical activity, and to document daily FBG levels.  She is no longer using a keto diet, and wants help in determining a sustainable and effective approach for her to manage BG and weight.   Assessment: Maryann has not been able to exercise b/c of foot pain.  She plans to see a health care provider for this, which has been going on for weeks.  She has been getting vegetables at dinner, but not often at lunch.  She has been more consistent getting to bed no later than 11 PM, and has felt more rested.  Cardelia has started reading Hooked by Salvatore Marvel, which has been informative.   Recent eating pattern: 3 meals and 2 snacks per day.   FBG last 3 days: 130s-150s. Recent physical activity: None due to foot injury several weeks ago.    Sleep: Estimates 7-8 hours sleep/night.   24-hr recall:  (Up at 6:30 AM) B (7 AM)-  16 oz c coffee, 1/2 c whole milk, 1/4 c heavy cream, 1/2 c low-CHO Great Grains Toasted Oats, 1/2 [employer]life whole milk,  Snk ( AM)-  --- L ( PM)-  Taco salad,  beef, chx, shrimp, sour cream, salsa, 4-5 chips, water Snk ( PM)-  --- D ( PM)-  1 small salad, drsng, 1/2 choc (birthday) cake, unswt tea Snk ( PM)-  --- Typical day? No.  Yesterday was her birthday, and she ate out for both L & D, which is very unusual.  Also seldom eats sweets.      Intervention: Reviewed recent diet and physical activity history, and re-established behavioral goals.    For recommendations and goals, see Patient Instructions.    Follow-up: Telehealth visit via [place] in 2 months.    Crist Kruszka,JEANNIE

## 2020-07-11 NOTE — Patient Instructions (Addendum)
Recommendations - On days you are working alone, plan and prepare meals and snacks the night before.   - When using creamed spinach, add some plain spinach to it to dilute the fat content.   - See any of the physicians at [place] on [facility] to evaluate your foot.   - Try out the Meal Planning form provided previously. - Continue to limit carbohydrate, especially late in the day.  Goals:  1. [facility] your [facility] toler[facility].  Start with just a few minutes at a time.  Continue to elevate your feet at the end of the day.    2. At dinner, obtain twice the volume of vegetables as either protein or starchy foods, and have at least one serving of a vegetable at lunch.         Pay attention to your satisfaction, and consider branching out to some new vegetables if you start getting tired of some.   3. Bedtime by 11 PM at least 4 X wk.    Document progress on your goals [facility] DAY, using a dedicated notebook.  Email a progress report in ~2 weeks.    Follow-up: Telehealth visit via [place] on Monday, September 27 at 4 PM.

## 2020-07-16 ENCOUNTER — Other Ambulatory Visit: Payer: Self-pay | Admitting: Primary Care

## 2020-07-16 DIAGNOSIS — E039 Hypothyroidism, unspecified: Secondary | ICD-10-CM

## 2020-07-25 ENCOUNTER — Other Ambulatory Visit: Payer: Self-pay | Admitting: Primary Care

## 2020-07-25 DIAGNOSIS — E119 Type 2 diabetes mellitus without complications: Secondary | ICD-10-CM

## 2020-07-26 ENCOUNTER — Other Ambulatory Visit: Payer: Self-pay | Admitting: *Deleted

## 2020-07-26 ENCOUNTER — Ambulatory Visit: Payer: Medicare HMO | Attending: Internal Medicine

## 2020-07-26 DIAGNOSIS — Z20822 Contact with and (suspected) exposure to covid-19: Secondary | ICD-10-CM

## 2020-07-27 LAB — SARS-COV-2, NAA 2 DAY TAT

## 2020-07-27 LAB — NOVEL CORONAVIRUS, NAA: SARS-CoV-2, NAA: NOT DETECTED

## 2020-07-30 ENCOUNTER — Telehealth (INDEPENDENT_AMBULATORY_CARE_PROVIDER_SITE_OTHER): Payer: Medicare HMO | Admitting: Primary Care

## 2020-07-30 ENCOUNTER — Other Ambulatory Visit: Payer: Self-pay

## 2020-07-30 ENCOUNTER — Ambulatory Visit (INDEPENDENT_AMBULATORY_CARE_PROVIDER_SITE_OTHER)
Admission: RE | Admit: 2020-07-30 | Discharge: 2020-07-30 | Disposition: A | Discharge status: Home / Self Care | Payer: Medicare HMO | Source: Ambulatory Visit | Attending: Primary Care | Admitting: Primary Care

## 2020-07-30 DIAGNOSIS — R059 Cough, unspecified: Secondary | ICD-10-CM

## 2020-07-30 DIAGNOSIS — R05 Cough: Secondary | ICD-10-CM

## 2020-07-30 IMAGING — DX DG CHEST 2V
2 series · 2 of 2 positions shown · non-contrast
Comparison: [DATE]

CLINICAL DATA: Dry cough

EXAM:
CHEST - 2 VIEW

[chest pa]
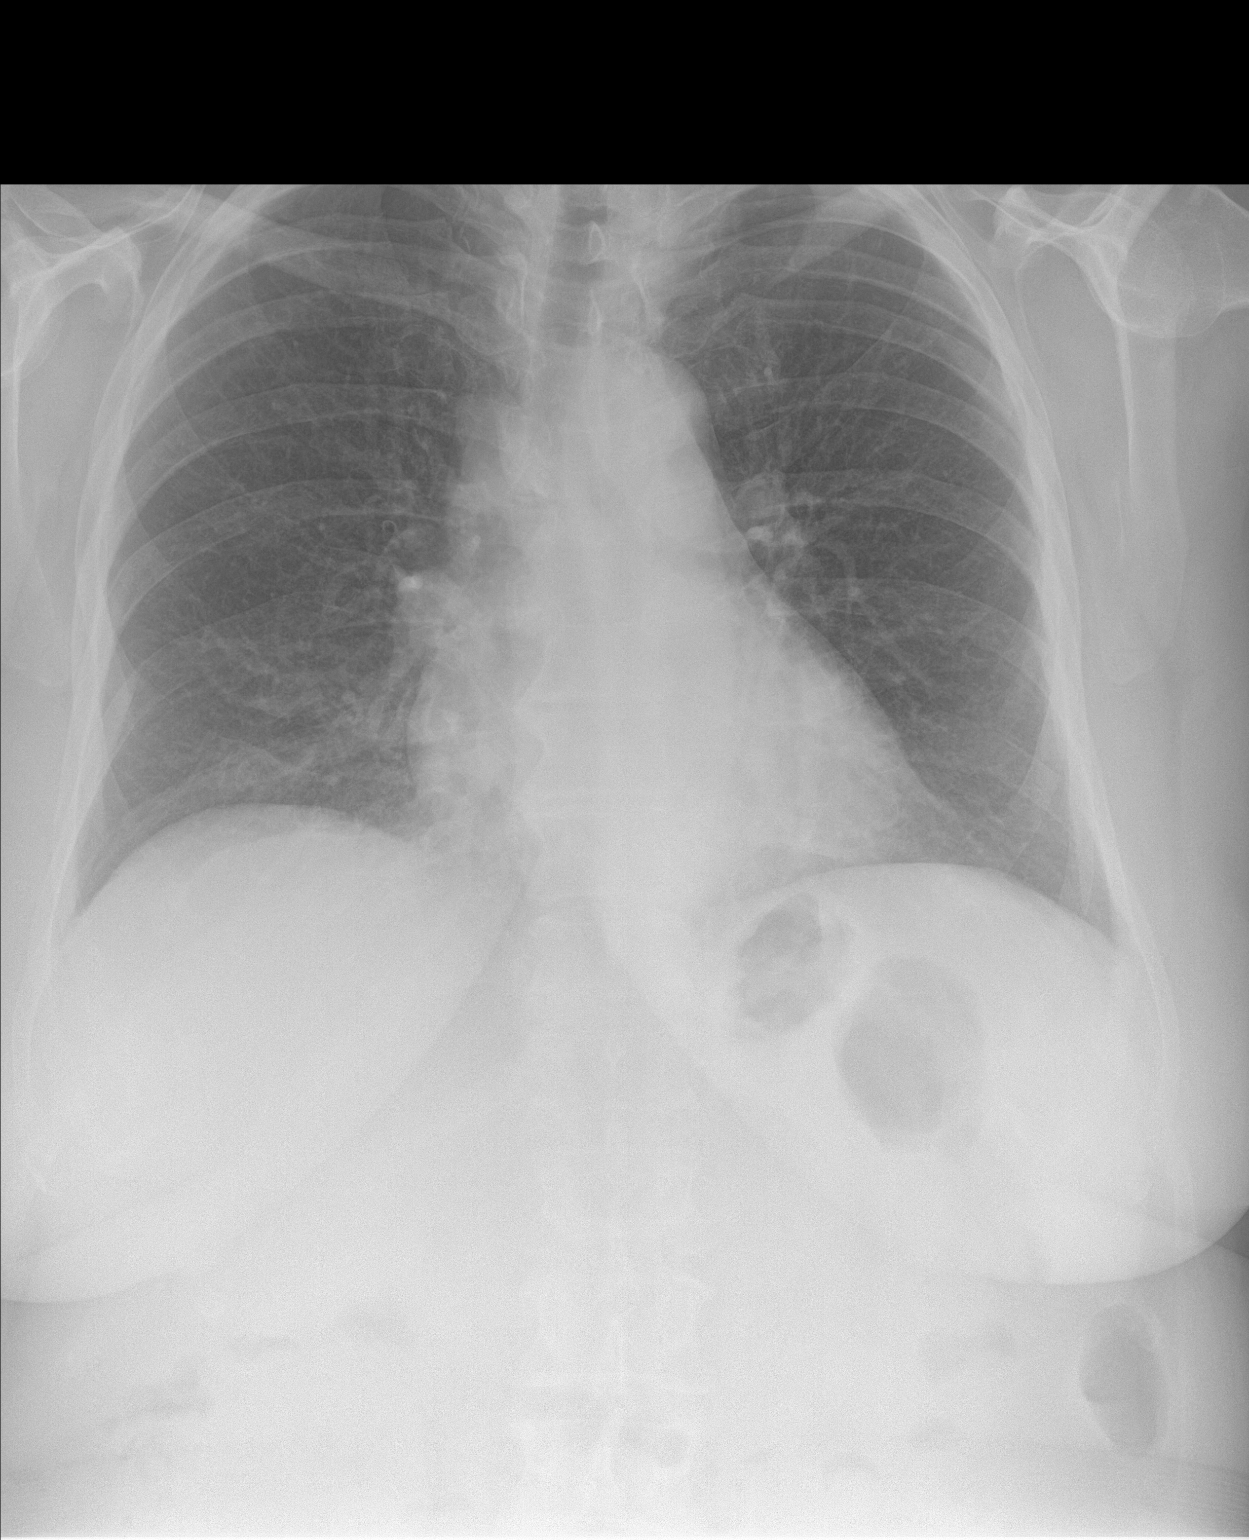

[chest lat]
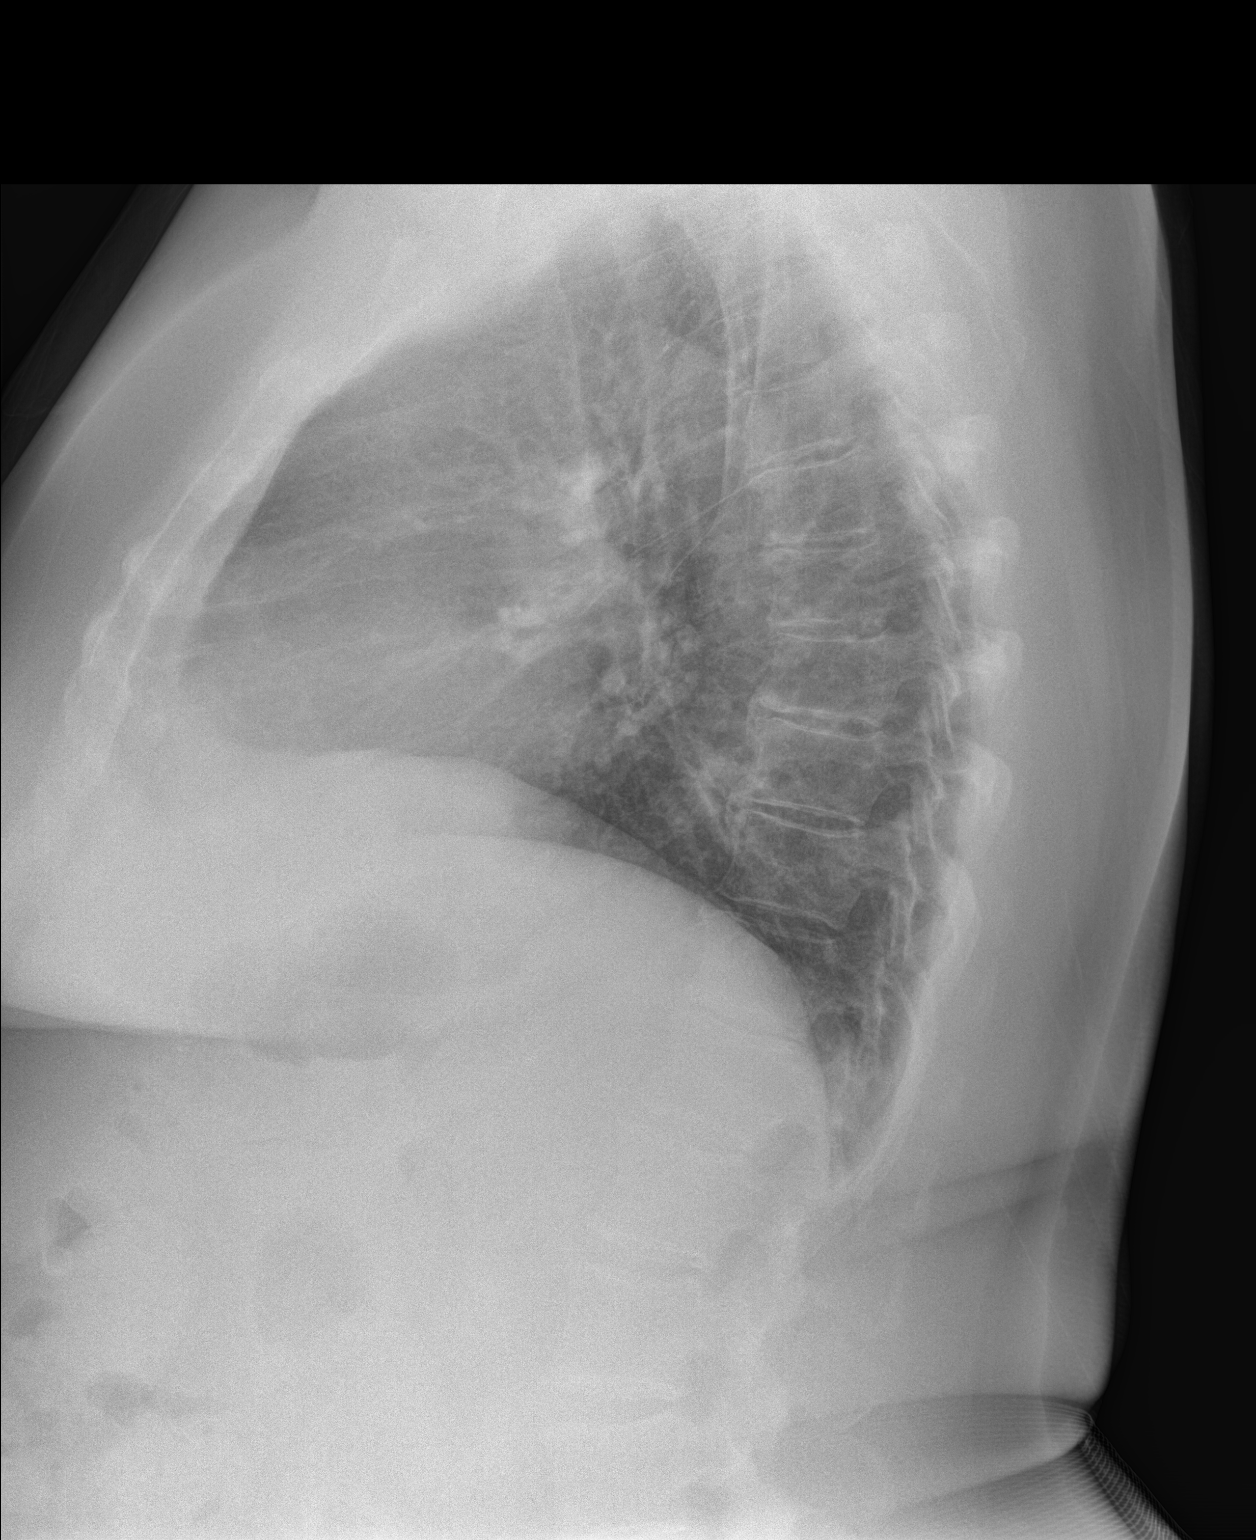

[2 of 2 positions shown; findings below may reference images not displayed]

FINDINGS: The heart size and mediastinal contours are within normal limits.
Both lungs are clear. The visualized skeletal structures are
unremarkable.
IMPRESSION: No active cardiopulmonary disease.

## 2020-07-30 NOTE — Assessment & Plan Note (Signed)
Symptoms x 7-10 days, feeling better one day ago. Appears well today, but she does have a dry, nagging cough during visit. She tested negative for Covid-19. Could still be viral, but given her age and medical history we need to rule out pneumonia. She will come in later today for chest xray. Continue OTC treatment for now.

## 2020-07-30 NOTE — Patient Instructions (Signed)
Please come by at your convenience for a chest xray.  I'll be in touch later with results.  It was a pleasure to see you today! Allie Bossier, NP-C

## 2020-07-30 NOTE — Progress Notes (Signed)
Subjective:    Patient ID: Valerie Gordon, female    DOB: June 16, 1951, 69 y.o.   MRN: [id number]  HPI  Virtual Visit via Video Note  I connected with Valerie Gordon on 07/30/20 at 10:20 AM EDT by a video enabled telemedicine application and verified that I am speaking with the correct person using two identifiers.  Location: Patient: Home Provider: Office Participants: Patient and myself   I discussed the limitations of evaluation and management by telemedicine and the availability of in person appointments. The patient expressed understanding and agreed to proceed.  History of Present Illness:  Valerie Gordon is a 69 year old female with a medical history of type 2 diabetes, hypothyroidism, GERD, hyperlipidemia who presents today with a chief complaint of cough.   She contacted [place] via My Chart two days ago with reports of a one week history of body aches, non productive cough, fatigue. She underwent Covid-19 testing on 07/26/20 which was negative.   Fevers were low grade running 99.9 over the last 5-6 days. This morning she woke up this morning drenched in sweats, thinks her fever broke. Yesterday she started feeling better, continues to feel better today.  Her only symptom now is a dry, non productive cough with fatigue. She's been taking Alka-Seltzer Night with improvement. She's also take OTC "cough syrup" without improvement.    Observations/Objective:  Alert and oriented. Appears well, not sickly. No distress. Speaking in complete sentences. Dry cough occurring during exam several times.   Assessment and Plan:  Symptoms x 7-10 days, feeling better one day ago. Appears well today, but she does have a dry, nagging cough during visit. She tested negative for Covid-19. Could still be viral, but given her age and medical history we need to rule out pneumonia. She will come in later today for chest xray. Continue OTC treatment for now.  Follow Up Instructions:  Please come  by at your convenience for a chest xray.  I'll be in touch later with results.  It was a pleasure to see you today! Allie Bossier, NP-C    I discussed the assessment and treatment plan with the patient. The patient was provided an opportunity to ask questions and all were answered. The patient agreed with the plan and demonstrated an understanding of the instructions.   The patient was advised to call back or seek an in-person evaluation if the symptoms worsen or if the condition fails to improve as anticipated.    Pleas Koch, NP    Review of Systems  Constitutional: Positive for chills, fatigue and fever.  HENT: Negative for congestion and sore throat.   Respiratory: Positive for cough. Negative for shortness of breath.   Cardiovascular: Negative for chest pain.       Past Medical History:  Diagnosis Date  . Cataract   . Depression   . GERD (gastroesophageal reflux disease)   . Glaucoma   . Thyroid disease   . Type 2 diabetes mellitus ([place])    manages with diet and exercise     Social History   Socioeconomic History  . Marital status: Widowed    Spouse name: Not on file  . Number of children: Not on file  . Years of education: Not on file  . Highest education level: Not on file  Occupational History  . Not on file  Tobacco Use  . Smoking status: Never Smoker  . Smokeless tobacco: Never Used  Vaping Use  . Vaping Use: Never used  Substance and Sexual Activity  . Alcohol use: Yes  . Drug use: No  . Sexual activity: Yes  Other Topics Concern  . Not on file  Social History Narrative   Widower.   Step children.   Environmental health practitioner.   Enjoys riding hot air balloons, spending time with friends, reading, jewelry   Social Determinants of Health   Financial Resource Strain:   . Difficulty of Paying Living Expenses:   Food Insecurity:   . Worried About Charity fundraiser in the Last Year:   . Arboriculturist in the Last Year:     Transportation Needs:   . Film/video editor (Medical):   Marland Kitchen Lack of Transportation (Non-Medical):   Physical Activity:   . Days of Exercise per Week:   . Minutes of Exercise per Session:   Stress:   . Feeling of Stress :   Social Connections:   . Frequency of Communication with Friends and Family:   . Frequency of Social Gatherings with Friends and Family:   . Attends Religious Services:   . Active Member of Clubs or Organizations:   . Attends Archivist Meetings:   Marland Kitchen Marital Status:   Intimate Partner Violence:   . Fear of Current or Ex-Partner:   . Emotionally Abused:   Marland Kitchen Physically Abused:   . Sexually Abused:     Past Surgical History:  Procedure Laterality Date  . BREAST BIOPSY  2010  . CARDIAC CATHETERIZATION  1998  . COLONOSCOPY  10-30-2004  . COLONOSCOPY WITH PROPOFOL N/A 04/01/2016   Procedure: COLONOSCOPY WITH PROPOFOL;  Surgeon: Robert Bellow, MD;  [facility] END[facility]COPY;  Service: Endoscopy;  Laterality: N/A;  . EYE SURGERY Bilateral    cataract sx  . TONSILLECTOMY      Family History  Problem Relation Age of Onset  . Stroke Mother   . Heart disease Father     Allergies  Allergen Reactions  . Antihistamines, Chlorpheniramine-Type   . Ciprocin-Fluocin-Procin [Fluocinolone]   . Metformin And Related     Jittery, diarrhea  . Trulicity [Dulaglutide]     Current Outpatient Medications on File Prior to Visit  Medication Sig Dispense Refill  . b complex vitamins tablet Take 1 tablet by mouth daily.    Marland Kitchen BIOTIN PO Take 5 mg by mouth.    . Cholecalciferol (D3 HIGH POTENCY) 2000 units CAPS Take by mouth.    . fluconazole (DIFLUCAN) 100 MG tablet Take 100 mg by mouth daily.    . Insulin Glargine (BASAGLAR KWIKPEN) 100 UNIT/ML INJECT 0.2 MLS (20 UNITS TOTAL) INTO THE SKIN 2 (TWO) TIMES DAILY. 15 mL 1  . Insulin Pen Needle (B-D ULTRAFINE III SHORT PEN) 31G X 8 MM MISC USE NIGHTLY WITH INSULIN 100 each 1  . Insulin Pen Needle (PEN NEEDLES  5/16") 30G X 8 MM MISC Use nightly with insulin 100 each 2  . Krill Oil 300 MG CAPS Take by mouth.    . latanoprost (XALATAN) 0.005 % ophthalmic solution 1 drop at bedtime.    . Magnesium 200 MG TABS Take by mouth.    . Menaquinone-7 (VITAMIN K2 PO) Take 19 mcg by mouth.    . metFORMIN (GLUCOPHAGE) 1000 MG tablet Take 1 tablet (1,000 mg total) by mouth 2 (two) times daily with a meal. For diabetes. 180 tablet 3  . nitrofurantoin, macrocrystal-monohydrate, (MACROBID) 100 MG capsule Take 1 capsule (100 mg total) by mouth 2 (two) times daily. 10 capsule 0  .  rosuvastatin (CRESTOR) 10 MG tablet TAKE 1 TABLET BY MOUTH DAILY. FOR CHOLESTEROL. 90 tablet 1  . SYNTHROID 100 MCG tablet TAKE 1 TABLET BY MOUTH EVERY MORNING ON AN EMPTY STOMACH WITH WATER ONLY. NO FOOD OR OTHER MEDICATIONS FOR 30 MINUTES. 90 tablet 0  . VITAMIN A PO Take 2,400 mcg by mouth.     No current facility-administered medications on file prior to visit.    There were no vitals taken for this visit.   Objective:   Physical Exam Constitutional:      Appearance: She is not ill-appearing.  Pulmonary:     Effort: Pulmonary effort is normal. No respiratory distress.     Comments: Dry, bronchospasm type cough noted during visit Neurological:     Mental Status: She is alert and oriented to person, place, and time.  Psychiatric:        Mood and Affect: Mood normal.            Assessment & Plan:

## 2020-08-06 MED ORDER — LEVOTHYROXINE SODIUM 100 MCG PO TABS: 100.0000 ug | ORAL_TABLET | Freq: Every day | ORAL | 0 refills | Status: DC

## 2020-08-19 ENCOUNTER — Ambulatory Visit: Payer: Medicare HMO

## 2020-08-21 ENCOUNTER — Other Ambulatory Visit: Payer: Self-pay

## 2020-08-21 ENCOUNTER — Other Ambulatory Visit (INDEPENDENT_AMBULATORY_CARE_PROVIDER_SITE_OTHER): Payer: Medicare HMO

## 2020-08-21 DIAGNOSIS — E039 Hypothyroidism, unspecified: Secondary | ICD-10-CM

## 2020-08-21 DIAGNOSIS — E119 Type 2 diabetes mellitus without complications: Secondary | ICD-10-CM | POA: Diagnosis not present

## 2020-08-21 DIAGNOSIS — E785 Hyperlipidemia, unspecified: Secondary | ICD-10-CM | POA: Diagnosis not present

## 2020-08-22 LAB — CBC
HCT: 42.8 % (ref 36.0–46.0)
Hemoglobin: 14.4 g/dL (ref 12.0–15.0)
MCHC: 33.6 g/dL (ref 30.0–36.0)
MCV: 93.9 fl (ref 78.0–100.0)
Platelets: 281 10*3/uL (ref 150.0–400.0)
RBC: 4.56 Mil/uL (ref 3.87–5.11)
RDW: 13.5 % (ref 11.5–15.5)
WBC: 10.2 10*3/uL (ref 4.0–10.5)

## 2020-08-22 LAB — COMPREHENSIVE METABOLIC PANEL
ALT: 19 U/L (ref 0–35)
AST: 37 U/L (ref 0–37)
Albumin: 4.3 g/dL (ref 3.5–5.2)
Alkaline Phosphatase: 62 U/L (ref 39–117)
BUN: 11 mg/dL (ref 6–23)
CO2: 26 mEq/L (ref 19–32)
Calcium: 10.1 mg/dL (ref 8.4–10.5)
Chloride: 102 mEq/L (ref 96–112)
Creatinine, Ser: 0.87 mg/dL (ref 0.40–1.20)
GFR: 64.92 mL/min (ref >60.00)
Glucose, Bld: 86 mg/dL (ref 70–99)
Potassium: 4.1 mEq/L (ref 3.5–5.1)
Sodium: 136 mEq/L (ref 135–145)
Total Bilirubin: 0.3 mg/dL (ref 0.2–1.2)
Total Protein: 7.5 g/dL (ref 6.0–8.3)

## 2020-08-22 LAB — LIPID PANEL
Cholesterol: 135 mg/dL (ref 0–200)
HDL: 56.4 mg/dL (ref >39.00)
LDL Cholesterol: 40 mg/dL (ref 0–99)
NonHDL: 78.12
Total CHOL/HDL Ratio: 2
Triglycerides: 189 mg/dL — ABNORMAL HIGH (ref 0.0–149.0)
VLDL: 37.8 mg/dL (ref 0.0–40.0)

## 2020-08-22 LAB — HEMOGLOBIN A1C: Hgb A1c MFr Bld: 7.5 % — ABNORMAL HIGH (ref 4.6–6.5)

## 2020-08-22 LAB — TSH: TSH: 1.64 u[IU]/mL (ref 0.35–4.50)

## 2020-08-23 ENCOUNTER — Encounter: Payer: Self-pay | Admitting: Primary Care

## 2020-08-23 ENCOUNTER — Ambulatory Visit (INDEPENDENT_AMBULATORY_CARE_PROVIDER_SITE_OTHER): Payer: Medicare HMO

## 2020-08-23 ENCOUNTER — Other Ambulatory Visit: Payer: Self-pay

## 2020-08-23 ENCOUNTER — Ambulatory Visit (INDEPENDENT_AMBULATORY_CARE_PROVIDER_SITE_OTHER): Payer: Medicare HMO | Admitting: Primary Care

## 2020-08-23 VITALS — BP 124/72 | HR 95 | Ht 61.0 in | Wt 219.0 lb

## 2020-08-23 DIAGNOSIS — R05 Cough: Secondary | ICD-10-CM | POA: Diagnosis not present

## 2020-08-23 DIAGNOSIS — E119 Type 2 diabetes mellitus without complications: Secondary | ICD-10-CM | POA: Diagnosis not present

## 2020-08-23 DIAGNOSIS — E039 Hypothyroidism, unspecified: Secondary | ICD-10-CM

## 2020-08-23 DIAGNOSIS — Z0000 Encounter for general adult medical examination without abnormal findings: Secondary | ICD-10-CM | POA: Diagnosis not present

## 2020-08-23 DIAGNOSIS — K219 Gastro-esophageal reflux disease without esophagitis: Secondary | ICD-10-CM

## 2020-08-23 DIAGNOSIS — R69 Illness, unspecified: Secondary | ICD-10-CM | POA: Diagnosis not present

## 2020-08-23 DIAGNOSIS — Z23 Encounter for immunization: Secondary | ICD-10-CM

## 2020-08-23 DIAGNOSIS — E785 Hyperlipidemia, unspecified: Secondary | ICD-10-CM

## 2020-08-23 DIAGNOSIS — F329 Major depressive disorder, single episode, unspecified: Secondary | ICD-10-CM

## 2020-08-23 DIAGNOSIS — R059 Cough, unspecified: Secondary | ICD-10-CM

## 2020-08-23 DIAGNOSIS — F32A Depression, unspecified: Secondary | ICD-10-CM

## 2020-08-23 DIAGNOSIS — E559 Vitamin D deficiency, unspecified: Secondary | ICD-10-CM | POA: Diagnosis not present

## 2020-08-23 MED ORDER — BASAGLAR KWIKPEN 100 UNIT/ML ~~LOC~~ SOPN: 25.0000 [IU] | PEN_INJECTOR | Freq: Every day | SUBCUTANEOUS | 1 refills | Status: DC

## 2020-08-23 MED ORDER — ZOSTER VAC RECOMB ADJUVANTED 50 MCG/0.5ML IM SUSR: 0.5000 mL | Freq: Once | INTRAMUSCULAR | 1 refills | Status: AC

## 2020-08-23 MED ORDER — LEVOTHYROXINE SODIUM 100 MCG PO TABS: ORAL_TABLET | ORAL | 3 refills | Status: DC

## 2020-08-23 MED ORDER — ROSUVASTATIN CALCIUM 10 MG PO TABS: ORAL_TABLET | ORAL | 3 refills | Status: DC

## 2020-08-23 MED ORDER — METFORMIN HCL 1000 MG PO TABS: 1000.0000 mg | ORAL_TABLET | Freq: Two times a day (BID) | ORAL | 3 refills | Status: DC

## 2020-08-23 NOTE — Assessment & Plan Note (Signed)
Dry cough noted today several times.  Xray from earlier this month negative.  Recommended famotidine 20 mg daily for at least one month. She will update.

## 2020-08-23 NOTE — Progress Notes (Signed)
Subjective:    Patient ID: Valerie Gordon, female    DOB: 08-17-1951, 69 y.o.   MRN: [id number]  HPI  This visit occurred during the SARS-CoV-2 public health emergency.  Safety protocols were in place, including screening questions prior to the visit, additional usage of staff PPE, and extensive cleaning of exam room while observing appropriate contact time as indicated for disinfecting solutions.   Valerie Gordon is a 69 year old female who presents today for complete physical.  Immunizations: -Tetanus: Completed in 2017 -Influenza: Due this season  -Shingles: Completed Zostavax -Pneumonia: Completed Pneumovax in 2017, due  -Covid-19: Completed series  Diet: She endorses a healthy diet, working with a nutritionist  Exercise: She is doing light exercise, is working up.   Eye exam: Due Dental exam: Completed annually   Mammogram: Completed in 2021 Dexa: Due Colonoscopy: Completed in 2017, due in 2027 Hep C Screen: Negative  BP Readings from Last 3 Encounters:  08/23/20 124/72  03/18/20 124/84  02/16/20 124/82      Review of Systems  Constitutional: Negative for unexpected weight change.  HENT: Negative for rhinorrhea.   Respiratory: Positive for cough and shortness of breath.        Residual cough from URI earlier this month. Residual shortness of breath with moderate exertion.  Cardiovascular: Negative for chest pain.  Gastrointestinal: Negative for constipation and diarrhea.  Genitourinary: Negative for difficulty urinating.  Musculoskeletal: Negative for arthralgias.  Skin: Negative for rash.  Allergic/Immunologic: Negative for environmental allergies.  Neurological: Negative for dizziness, numbness and headaches.  Psychiatric/Behavioral: The patient is not nervous/anxious.        Past Medical History:  Diagnosis Date  . Cataract   . Depression   . GERD (gastroesophageal reflux disease)   . Glaucoma   . Thyroid disease   . Type 2 diabetes mellitus ([place])     manages with diet and exercise     Social History   Socioeconomic History  . Marital status: Widowed    Spouse name: Not on file  . Number of children: Not on file  . Years of education: Not on file  . Highest education level: Not on file  Occupational History  . Not on file  Tobacco Use  . Smoking status: Never Smoker  . Smokeless tobacco: Never Used  Vaping Use  . Vaping Use: Never used  Substance and Sexual Activity  . Alcohol use: Yes    Comment: occasional  . Drug use: No  . Sexual activity: Yes  Other Topics Concern  . Not on file  Social History Narrative   Widower.   Step children.   Environmental health practitioner.   Enjoys riding hot air balloons, spending time with friends, reading, jewelry   Social Determinants of Health   Financial Resource Strain: Low Risk   . Difficulty of Paying Living Expenses: Not hard at all  Food Insecurity: No Food Insecurity  . Worried About Charity fundraiser in the Last Year: Never true  . Ran Out of Food in the Last Year: Never true  Transportation Needs: No Transportation Needs  . Lack of Transportation (Medical): No  . Lack of Transportation (Non-Medical): No  Physical Activity: Insufficiently Active  . Days of Exercise per Week: 7 days  . Minutes of Exercise per Session: 10 min  Stress: No Stress Concern Present  . Feeling of Stress : Not at all  Social Connections:   . Frequency of Communication with Friends and Family: Not on  file  . Frequency of Social Gatherings with Friends and Family: Not on file  . Attends Religious Services: Not on file  . Active Member of Clubs or Organizations: Not on file  . Attends Archivist Meetings: Not on file  . Marital Status: Not on file  Intimate Partner Violence: Not At Risk  . Fear of Current or Ex-Partner: No  . Emotionally Abused: No  . Physically Abused: No  . Sexually Abused: No    Past Surgical History:  Procedure Laterality Date  . BREAST BIOPSY  2010  .  CARDIAC CATHETERIZATION  1998  . COLONOSCOPY  10-30-2004  . COLONOSCOPY WITH PROPOFOL N/A 04/01/2016   Procedure: COLONOSCOPY WITH PROPOFOL;  Surgeon: Robert Bellow, MD;  Location: [facility] ENDOSCOPY;  Service: Endoscopy;  Laterality: N/A;  . EYE SURGERY Bilateral    cataract sx  . TONSILLECTOMY      Family History  Problem Relation Age of Onset  . Stroke Mother   . Heart disease Father     Allergies  Allergen Reactions  . Antihistamines, Chlorpheniramine-Type   . Ciprocin-Fluocin-Procin [Fluocinolone]   . Trulicity [Dulaglutide]     Current Outpatient Medications on File Prior to Visit  Medication Sig Dispense Refill  . b complex vitamins tablet Take 1 tablet by mouth daily.    Marland Kitchen BIOTIN PO Take 5 mg by mouth.    . Cholecalciferol (D3 HIGH POTENCY) 2000 units CAPS Take by mouth.    . Insulin Pen Needle (B-D ULTRAFINE III SHORT PEN) 31G X 8 MM MISC USE NIGHTLY WITH INSULIN 100 each 1  . Insulin Pen Needle (PEN NEEDLES 5/16") 30G X 8 MM MISC Use nightly with insulin 100 each 2  . Krill Oil 300 MG CAPS Take by mouth.    . latanoprost (XALATAN) 0.005 % ophthalmic solution 1 drop at bedtime.    . Magnesium 200 MG TABS Take by mouth.    . Menaquinone-7 (VITAMIN K2 PO) Take 19 mcg by mouth.    Marland Kitchen VITAMIN A PO Take 2,400 mcg by mouth.     No current facility-administered medications on file prior to visit.    BP 124/72   Pulse 95   Ht 5\' 1"  (1.549 m)   Wt 219 lb (99.3 kg)   SpO2 99%   BMI 41.38 kg/m    Objective:   Physical Exam HENT:     Right Ear: Tympanic membrane and ear canal normal.     Left Ear: Tympanic membrane and ear canal normal.  Eyes:     Pupils: Pupils are equal, round, and reactive to light.  Cardiovascular:     Rate and Rhythm: Normal rate and regular rhythm.  Pulmonary:     Effort: Pulmonary effort is normal.     Breath sounds: Normal breath sounds.     Comments: Dry cough noted during exam a few times. Abdominal:     General: Bowel sounds are  normal.     Palpations: Abdomen is soft.     Tenderness: There is no abdominal tenderness.  Musculoskeletal:        General: Normal range of motion.     Cervical back: Neck supple.  Skin:    General: Skin is warm and dry.  Neurological:     Mental Status: She is alert and oriented to person, place, and time.     Cranial Nerves: No cranial nerve deficit.     Deep Tendon Reflexes:     Reflex Scores:  Patellar reflexes are 2+ on the right side and 2+ on the left side.           Assessment & Plan:

## 2020-08-23 NOTE — Assessment & Plan Note (Signed)
She is taking levothyroxine correctly Recent TSH unremarkable.  Continue levothyroxine 100 mcg.

## 2020-08-23 NOTE — Assessment & Plan Note (Signed)
Recent A1C overall okay, would like to see her below 7.  She is actively working on diet and exercise for weight loss.   Continue Basaglar at 25 units nightly and Metformin 1000 mg BID.   Managed on statin. Urine micro negative and due this Fall. Pneumonia vaccine provided today.  Follow up in 6 months.

## 2020-08-23 NOTE — Patient Instructions (Signed)
Valerie Gordon , Thank you for taking time to come for your Medicare Wellness Visit. I appreciate your ongoing commitment to your health goals. Please review the following plan we discussed and let me know if I can assist you in the future.   Screening recommendations/referrals: Colonoscopy: Up to date, completed 04/01/2016, due 03/2026 Mammogram: Up to date, completed 02/12/2020, due 01/2021 Bone Density: due, will complete. Discuss with provider today Recommended yearly ophthalmology/optometry visit for glaucoma screening and checkup Recommended yearly dental visit for hygiene and checkup  Vaccinations: Influenza vaccine: due, will receive this vaccine if available today  Pneumococcal vaccine: due, will receive at today's visit Tdap vaccine: Up to date, completed 03/30/2016, due 03/2026 Shingles vaccine: due, check with your insurance regarding coverage    Covid-19:Completed series  Advanced directives: Please bring a copy of your POA (Power of Kite) and/or Living Will to your next appointment.  Conditions/risks identified: diabetes, hyperlipidemia  Next appointment: Follow up in one year for your annual wellness visit    Preventive Care 30 Years and Older, Female Preventive care refers to lifestyle choices and visits with your health care provider that can promote health and wellness. What does preventive care include?  A yearly physical exam. This is also called an annual well check.  Dental exams once or twice a year.  Routine eye exams. Ask your health care provider how often you should have your eyes checked.  Personal lifestyle choices, including:  Daily care of your teeth and gums.  Regular physical activity.  Eating a healthy diet.  Avoiding tobacco and drug use.  Limiting alcohol use.  Practicing safe sex.  Taking low-dose aspirin every day.  Taking vitamin and mineral supplements as recommended by your health care provider. What happens during an annual well  check? The services and screenings done by your health care provider during your annual well check will depend on your age, overall health, lifestyle risk factors, and family history of disease. Counseling  Your health care provider may ask you questions about your:  Alcohol use.  Tobacco use.  Drug use.  Emotional well-being.  Home and relationship well-being.  Sexual activity.  Eating habits.  History of falls.  Memory and ability to understand (cognition).  Work and work Statistician.  Reproductive health. Screening  You may have the following tests or measurements:  Height, weight, and BMI.  Blood pressure.  Lipid and cholesterol levels. These may be checked every 5 years, or more frequently if you are over 61 years old.  Skin check.  Lung cancer screening. You may have this screening every year starting at age 34 if you have a 30-pack-year history of smoking and currently smoke or have quit within the past 15 years.  Fecal occult blood test (FOBT) of the stool. You may have this test every year starting at age 29.  Flexible sigmoidoscopy or colonoscopy. You may have a sigmoidoscopy every 5 years or a colonoscopy every 10 years starting at age 34.  Hepatitis C blood test.  Hepatitis B blood test.  Sexually transmitted disease (STD) testing.  Diabetes screening. This is done by checking your blood sugar (glucose) after you have not eaten for a while (fasting). You may have this done every 1-3 years.  Bone density scan. This is done to screen for osteoporosis. You may have this done starting at age 61.  Mammogram. This may be done every 1-2 years. Talk to your health care provider about how often you should have regular mammograms. Talk with  your health care provider about your test results, treatment options, and if necessary, the need for more tests. Vaccines  Your health care provider may recommend certain vaccines, such as:  Influenza vaccine. This is  recommended every year.  Tetanus, diphtheria, and acellular pertussis (Tdap, Td) vaccine. You may need a Td booster every 10 years.  Zoster vaccine. You may need this after age 89.  Pneumococcal 13-valent conjugate (PCV13) vaccine. One dose is recommended after age 45.  Pneumococcal polysaccharide (PPSV23) vaccine. One dose is recommended after age 68. Talk to your health care provider about which screenings and vaccines you need and how often you need them. This information is not intended to replace advice given to you by your health care provider. Make sure you discuss any questions you have with your health care provider. Document Released: 01/10/2016 Document Revised: 09/02/2016 Document Reviewed: 10/15/2015 Elsevier Interactive Patient Education  2017 [place] Prevention in the Home Falls can cause injuries. They can happen to people of all ages. There are many things you can do to make your home safe and to help prevent falls. What can I do on the outside of my home?  Regularly fix the edges of walkways and driveways and fix any cracks.  Remove anything that might make you trip as you walk through a door, such as a raised step or threshold.  Trim any bushes or trees on the path to your home.  Use bright outdoor lighting.  Clear any walking paths of anything that might make someone trip, such as rocks or tools.  Regularly check to see if handrails are loose or broken. Make sure that both sides of any steps have handrails.  Any raised decks and porches should have guardrails on the edges.  Have any leaves, snow, or ice cleared regularly.  Use sand or salt on walking paths during winter.  Clean up any spills in your garage right away. This includes oil or grease spills. What can I do in the bathroom?  Use night lights.  Install grab bars by the toilet and in the tub and shower. Do not use towel bars as grab bars.  Use non-skid mats or decals in the tub or  shower.  If you need to sit down in the shower, use a plastic, non-slip stool.  Keep the floor dry. Clean up any water that spills on the floor as soon as it happens.  Remove soap buildup in the tub or shower regularly.  Attach bath mats securely with double-sided non-slip rug tape.  Do not have throw rugs and other things on the floor that can make you trip. What can I do in the bedroom?  Use night lights.  Make sure that you have a light by your bed that is easy to reach.  Do not use any sheets or blankets that are too big for your bed. They should not hang down onto the floor.  Have a firm chair that has side arms. You can use this for support while you get dressed.  Do not have throw rugs and other things on the floor that can make you trip. What can I do in the kitchen?  Clean up any spills right away.  Avoid walking on wet floors.  Keep items that you use a lot in easy-to-reach places.  If you need to reach something above you, use a strong step stool that has a grab bar.  Keep electrical cords out of the way.  Do not  use floor polish or wax that makes floors slippery. If you must use wax, use non-skid floor wax.  Do not have throw rugs and other things on the floor that can make you trip. What can I do with my stairs?  Do not leave any items on the stairs.  Make sure that there are handrails on both sides of the stairs and use them. Fix handrails that are broken or loose. Make sure that handrails are as long as the stairways.  Check any carpeting to make sure that it is firmly attached to the stairs. Fix any carpet that is loose or worn.  Avoid having throw rugs at the top or bottom of the stairs. If you do have throw rugs, attach them to the floor with carpet tape.  Make sure that you have a light switch at the top of the stairs and the bottom of the stairs. If you do not have them, ask someone to add them for you. What else can I do to help prevent  falls?  Wear shoes that:  Do not have high heels.  Have rubber bottoms.  Are comfortable and fit you well.  Are closed at the toe. Do not wear sandals.  If you use a stepladder:  Make sure that it is fully opened. Do not climb a closed stepladder.  Make sure that both sides of the stepladder are locked into place.  Ask someone to hold it for you, if possible.  Clearly mark and make sure that you can see:  Any grab bars or handrails.  First and last steps.  Where the edge of each step is.  Use tools that help you move around (mobility aids) if they are needed. These include:  Canes.  Walkers.  Scooters.  Crutches.  Turn on the lights when you go into a dark area. Replace any light bulbs as soon as they burn out.  Set up your furniture so you have a clear path. Avoid moving your furniture around.  If any of your floors are uneven, fix them.  If there are any pets around you, be aware of where they are.  Review your medicines with your doctor. Some medicines can make you feel dizzy. This can increase your chance of falling. Ask your doctor what other things that you can do to help prevent falls. This information is not intended to replace advice given to you by your health care provider. Make sure you discuss any questions you have with your health care provider. Document Released: 10/10/2009 Document Revised: 05/21/2016 Document Reviewed: 01/18/2015 Elsevier Interactive Patient Education  2017 [employer].

## 2020-08-23 NOTE — Assessment & Plan Note (Signed)
Compliant to vitamin D. Continue same.

## 2020-08-23 NOTE — Assessment & Plan Note (Signed)
Prevnar due today, provided. Rx for Shingrix provided. Other immunizations UTD. Mammogram UTD. Bone density scan due, she would like to get this next year along with mammogram. Colonoscopy UTD, due in 2027  Discussed the importance of a healthy diet and regular exercise in order for weight loss, and to reduce the risk of any potential medical problems.  Exam today stable. Labs reviewed.

## 2020-08-23 NOTE — Assessment & Plan Note (Addendum)
Recent LDL unremarkable.  Refill provided for Crestor.   ECG with NSR with rate of 88. No PAC/PVC, ST changes. Appears similar compared to ECG from 2016.

## 2020-08-23 NOTE — Assessment & Plan Note (Signed)
Overall doing well, denies concerns. Continue to monitor.

## 2020-08-23 NOTE — Assessment & Plan Note (Signed)
Chest xray negative, she does have residual cough. Do suspect she may have a reflux cough that was aggravated by acute illness earlier this month.   Trial of famotidine 20 mg. She will update.

## 2020-08-23 NOTE — Addendum Note (Signed)
Addended by: Amado Coe on: 08/23/2020 04:08 PM   Modules accepted: Orders

## 2020-08-23 NOTE — Patient Instructions (Addendum)
For the cough, I recommend you try famotidine 20 mg once nightly for at least one month.   Continue exercising. You should be getting 150 minutes of moderate intensity exercise weekly.  Continue to work on a healthy diet. Ensure you are consuming 64 ounces of water daily.  Take the Shingles vaccine to the pharmacy.  Please schedule a follow up appointment in 6 months for diabetes.   It was a pleasure to see you today!   Preventive Care 67 Years and Older, Female Preventive care refers to lifestyle choices and visits with your health care provider that can promote health and wellness. This includes:  A yearly physical exam. This is also called an annual well check.  Regular dental and eye exams.  Immunizations.  Screening for certain conditions.  Healthy lifestyle choices, such as diet and exercise. What can I expect for my preventive care visit? Physical exam Your health care provider will check:  Height and weight. These may be used to calculate body mass index (BMI), which is a measurement that tells if you are at a healthy weight.  Heart rate and blood pressure.  Your skin for abnormal spots. Counseling Your health care provider may ask you questions about:  Alcohol, tobacco, and drug use.  Emotional well-being.  Home and relationship well-being.  Sexual activity.  Eating habits.  History of falls.  Memory and ability to understand (cognition).  Work and work Statistician.  Pregnancy and menstrual history. What immunizations do I need?  Influenza (flu) vaccine  This is recommended every year. Tetanus, diphtheria, and pertussis (Tdap) vaccine  You may need a Td booster every 10 years. Varicella (chickenpox) vaccine  You may need this vaccine if you have not already been vaccinated. Zoster (shingles) vaccine  You may need this after age 67. Pneumococcal conjugate (PCV13) vaccine  One dose is recommended after age 71. Pneumococcal polysaccharide  (PPSV23) vaccine  One dose is recommended after age 50. Measles, mumps, and rubella (MMR) vaccine  You may need at least one dose of MMR if you were born in 1957 or later. You may also need a second dose. Meningococcal conjugate (MenACWY) vaccine  You may need this if you have certain conditions. Hepatitis A vaccine  You may need this if you have certain conditions or if you travel or work in places where you may be exposed to hepatitis A. Hepatitis B vaccine  You may need this if you have certain conditions or if you travel or work in places where you may be exposed to hepatitis B. Haemophilus influenzae type b (Hib) vaccine  You may need this if you have certain conditions. You may receive vaccines as individual doses or as more than one vaccine together in one shot (combination vaccines). Talk with your health care provider about the risks and benefits of combination vaccines. What tests do I need? Blood tests  Lipid and cholesterol levels. These may be checked every 5 years, or more frequently depending on your overall health.  Hepatitis C test.  Hepatitis B test. Screening  Lung cancer screening. You may have this screening every year starting at age 67 if you have a 30-pack-year history of smoking and currently smoke or have quit within the past 15 years.  Colorectal cancer screening. All adults should have this screening starting at age 67 and continuing until age 19. Your health care provider may recommend screening at age 67 if you are at increased risk. You will have tests every 1-10 years, depending on  your results and the type of screening test.  Diabetes screening. This is done by checking your blood sugar (glucose) after you have not eaten for a while (fasting). You may have this done every 1-3 years.  Mammogram. This may be done every 1-2 years. Talk with your health care provider about how often you should have regular mammograms.  BRCA-related cancer screening.  This may be done if you have a family history of breast, ovarian, tubal, or peritoneal cancers. Other tests  Sexually transmitted disease (STD) testing.  Bone density scan. This is done to screen for osteoporosis. You may have this done starting at age 48. Follow these instructions at home: Eating and drinking  Eat a diet that includes fresh fruits and vegetables, whole grains, lean protein, and low-fat dairy products. Limit your intake of foods with high amounts of sugar, saturated fats, and salt.  Take vitamin and mineral supplements as recommended by your health care provider.  Do not drink alcohol if your health care provider tells you not to drink.  If you drink alcohol: ? Limit how much you have to 0-1 drink a day. ? Be aware of how much alcohol is in your drink. In the [place] one drink equals one 12 oz bottle of beer (355 mL), one 5 oz glass of wine (148 mL), or one 1 oz glass of hard liquor (44 mL). Lifestyle  Take daily care of your teeth and gums.  Stay active. Exercise for at least 30 minutes on 5 or more days each week.  Do not use any products that contain nicotine or tobacco, such as cigarettes, e-cigarettes, and chewing tobacco. If you need help quitting, ask your health care provider.  If you are sexually active, practice safe sex. Use a condom or other form of protection in order to prevent STIs (sexually transmitted infections).  Talk with your health care provider about taking a low-dose aspirin or statin. What's next?  Go to your health care provider once a year for a well check visit.  Ask your health care provider how often you should have your eyes and teeth checked.  Stay up to date on all vaccines. This information is not intended to replace advice given to you by your health care provider. Make sure you discuss any questions you have with your health care provider. Document Revised: 12/08/2018 Document Reviewed: 12/08/2018 Elsevier Patient Education   2020 [employer].

## 2020-08-23 NOTE — Progress Notes (Signed)
Subjective:   Valerie Gordon is a 69 y.o. female who presents for Medicare Annual (Subsequent) preventive examination.  Review of Systems: N/A      I connected with the patient today by telephone and verified that I am speaking with the correct person using two identifiers. Location patient: home Location nurse: work Persons participating in the telephone visit: patient, nurse.   I discussed the limitations, risks, security and privacy concerns of performing an evaluation and management service by telephone and the availability of in person appointments. I also discussed with the patient that there may be a patient responsible charge related to this service. The patient expressed understanding and verbally consented to this telephonic visit.        Cardiac Risk Factors include: advanced age (>4men, >31 women);diabetes mellitus;dyslipidemia     Objective:    Today's Vitals   08/23/20 1032  PainSc: 0-No pain   There is no height or weight on file to calculate BMI.  Advanced Directives 08/23/2020  Does Patient Have a Medical Advance Directive? Yes  Type of Paramedic of [place];Living will  Copy of [place] in Chart? No - copy requested    Current Medications (verified) Outpatient Encounter Medications as of 08/23/2020  Medication Sig  . b complex vitamins tablet Take 1 tablet by mouth daily.  Marland Kitchen BIOTIN PO Take 5 mg by mouth.  . Cholecalciferol (D3 HIGH POTENCY) 2000 units CAPS Take by mouth.  . fluconazole (DIFLUCAN) 100 MG tablet Take 100 mg by mouth daily.  . Insulin Glargine (BASAGLAR KWIKPEN) 100 UNIT/ML INJECT 0.2 MLS (20 UNITS TOTAL) INTO THE SKIN 2 (TWO) TIMES DAILY.  Marland Kitchen Insulin Pen Needle (B-D ULTRAFINE III SHORT PEN) 31G X 8 MM MISC USE NIGHTLY WITH INSULIN  . Insulin Pen Needle (PEN NEEDLES 5/16") 30G X 8 MM MISC Use nightly with insulin  . Krill Oil 300 MG CAPS Take by mouth.  . latanoprost (XALATAN) 0.005 % ophthalmic  solution 1 drop at bedtime.  Marland Kitchen levothyroxine (SYNTHROID) 100 MCG tablet Take 1 tablet (100 mcg total) by mouth daily.  . Magnesium 200 MG TABS Take by mouth.  . Menaquinone-7 (VITAMIN K2 PO) Take 19 mcg by mouth.  . metFORMIN (GLUCOPHAGE) 1000 MG tablet Take 1 tablet (1,000 mg total) by mouth 2 (two) times daily with a meal. For diabetes.  . nitrofurantoin, macrocrystal-monohydrate, (MACROBID) 100 MG capsule Take 1 capsule (100 mg total) by mouth 2 (two) times daily.  . rosuvastatin (CRESTOR) 10 MG tablet TAKE 1 TABLET BY MOUTH DAILY. FOR CHOLESTEROL.  Marland Kitchen VITAMIN A PO Take 2,400 mcg by mouth.   No facility-administered encounter medications on file as of 08/23/2020.    Allergies (verified) Antihistamines, chlorpheniramine-type; Ciprocin-fluocin-procin [fluocinolone]; and Trulicity [dulaglutide]   History: Past Medical History:  Diagnosis Date  . Cataract   . Depression   . GERD (gastroesophageal reflux disease)   . Glaucoma   . Thyroid disease   . Type 2 diabetes mellitus ([place])    manages with diet and exercise   Past Surgical History:  Procedure Laterality Date  . BREAST BIOPSY  2010  . CARDIAC CATHETERIZATION  1998  . COLONOSCOPY  10-30-2004  . COLONOSCOPY WITH PROPOFOL N/A 04/01/2016   Procedure: COLONOSCOPY WITH PROPOFOL;  Surgeon: Robert Bellow, MD;  Location: [facility] ENDOSCOPY;  Service: Endoscopy;  Laterality: N/A;  . EYE SURGERY Bilateral    cataract sx  . TONSILLECTOMY     Family History  Problem Relation Age  of Onset  . Stroke Mother   . Heart disease Father    Social History   Socioeconomic History  . Marital status: Widowed    Spouse name: Not on file  . Number of children: Not on file  . Years of education: Not on file  . Highest education level: Not on file  Occupational History  . Not on file  Tobacco Use  . Smoking status: Never Smoker  . Smokeless tobacco: Never Used  Vaping Use  . Vaping Use: Never used  Substance and Sexual Activity  . Alcohol  use: Yes    Comment: occasional  . Drug use: No  . Sexual activity: Yes  Other Topics Concern  . Not on file  Social History Narrative   Widower.   Step children.   Environmental health practitioner.   Enjoys riding hot air balloons, spending time with friends, reading, jewelry   Social Determinants of Health   Financial Resource Strain: Low Risk   . Difficulty of Paying Living Expenses: Not hard at all  Food Insecurity: No Food Insecurity  . Worried About Charity fundraiser in the Last Year: Never true  . Ran Out of Food in the Last Year: Never true  Transportation Needs: No Transportation Needs  . Lack of Transportation (Medical): No  . Lack of Transportation (Non-Medical): No  Physical Activity: Insufficiently Active  . Days of Exercise per Week: 7 days  . Minutes of Exercise per Session: 10 min  Stress: No Stress Concern Present  . Feeling of Stress : Not at all  Social Connections:   . Frequency of Communication with Friends and Family: Not on file  . Frequency of Social Gatherings with Friends and Family: Not on file  . Attends Religious Services: Not on file  . Active Member of Clubs or Organizations: Not on file  . Attends Archivist Meetings: Not on file  . Marital Status: Not on file    Tobacco Counseling Counseling given: Not Answered   Clinical Intake:  Pre-visit preparation completed: Yes  Pain : No/denies pain Pain Score: 0-No pain     Nutritional Risks: None Diabetes: Yes CBG done?: No Did pt. bring in CBG monitor from home?: No  How often do you need to have someone help you when you read instructions, pamphlets, or other written materials from your doctor or pharmacy?: 1 - Never What is the last grade level you completed in school?: bachelors in insurance training  Diabetic: Yes Nutrition Risk Assessment:  Has the patient had any N/V/D within the last 2 months?  No  Does the patient have any non-healing wounds?  No  Has the patient  had any unintentional weight loss or weight gain?  No   Diabetes:  Is the patient diabetic?  Yes  If diabetic, was a CBG obtained today?  No  Did the patient bring in their glucometer from home?  No  How often do you monitor your CBG's? daily. 120-150 in the mornings  Financial Strains and Diabetes Management:  Are you having any financial strains with the device, your supplies or your medication? No .  Does the patient want to be seen by Chronic Care Management for management of their diabetes?  No  Would the patient like to be referred to a Nutritionist or for Diabetic Management?  No   Diabetic Exams:  Diabetic Eye Exam: Completed 12/30/2019 Diabetic Foot Exam: Completed 10/30/2019      Information entered by :: CJohnson, LPN  Activities of Daily Living In your present state of health, do you have any difficulty performing the following activities: 08/23/2020  Hearing? N  Vision? N  Difficulty concentrating or making decisions? N  Walking or climbing stairs? N  Dressing or bathing? N  Doing errands, shopping? N  Preparing Food and eating ? N  Using the Toilet? N  In the past six months, have you accidently leaked urine? N  Do you have problems with loss of bowel control? N  Managing your Medications? N  Managing your Finances? N  Housekeeping or managing your Housekeeping? N  Some recent data might be hidden    Patient Care Team: Pleas Koch, NP as PCP - General (Internal Medicine) Ammie Dalton, Okey Regal, MD as Consulting Physician (Obstetrics and Gynecology) Bary Castilla Forest Gleason, MD (General Surgery) Kennith Center, RD as Dietitian (Family Medicine)  Indicate any recent Medical Services you may have received from other than [facility] providers in the past year (date may be approximate).     Assessment:   This is a routine wellness examination for Vi.  Hearing/Vision screen  Hearing Screening   125Hz  250Hz  500Hz  1000Hz  2000Hz  3000Hz  4000Hz  6000Hz  8000Hz     Right ear:           Left ear:           Vision Screening Comments: Patient gets annual eye exams.  Dietary issues and exercise activities discussed: Current Exercise Habits: Home exercise routine, Type of exercise: walking;Other - see comments (elliptical), Time (Minutes): 10, Frequency (Times/Week): 7, Weekly Exercise (Minutes/Week): 70, Intensity: Moderate, Exercise limited by: None identified  Goals    . Patient Stated     08/23/2020, I will continue to work with my dietician to help me lose 30-40 lbs total.       Depression Screen PHQ 2/9 Scores 08/23/2020 01/16/2015  PHQ - 2 Score 0 3  PHQ- 9 Score 0 13    Fall Risk Fall Risk  08/23/2020 08/10/2018 01/16/2015  Falls in the past year? 0 No No  Number falls in past yr: 0 - -  Injury with Fall? 0 - -  Risk for fall due to : Medication side effect - -  Follow up Falls evaluation completed;Falls prevention discussed - -    Any stairs in or around the home? Yes  If so, are there any without handrails? No  Home free of loose throw rugs in walkways, pet beds, electrical cords, etc? Yes  Adequate lighting in your home to reduce risk of falls? Yes   ASSISTIVE DEVICES UTILIZED TO PREVENT FALLS:  Life alert? No  Use of a cane, walker or w/c? No  Grab bars in the bathroom? No  Shower chair or bench in shower? No  Elevated toilet seat or a handicapped toilet? No   TIMED UP AND GO:  Was the test performed? N/A, telephonic visit.    Cognitive Function: MMSE - Mini Mental State Exam 08/23/2020  Orientation to time 5  Orientation to Place 5  Registration 3  Attention/ Calculation 5  Recall 3  Language- repeat 1       Mini Cog  Mini-Cog screen was completed. Maximum score is 22. A value of 0 denotes this part of the MMSE was not completed or the patient failed this part of the Mini-Cog screening.  Immunizations Immunization History  Administered Date(s) Administered  . Influenza,inj,Quad PF,6+ Mos 01/16/2015, 11/23/2017,  01/18/2019, 10/30/2019  . PFIZER SARS-COV-2 Vaccination 04/27/2020, 05/18/2020  . Pneumococcal Polysaccharide-23  03/30/2016  . Td 03/30/2016  . Tdap 02/24/2006  . Zoster 01/16/2015    TDAP status: Up to date Flu Vaccine status: will get at today's visit if available Pneumococcal vaccine status: will get at today's visit  Covid-19 vaccine status: Completed vaccines  Qualifies for Shingles Vaccine? Yes   Zostavax completed Yes   Shingrix Completed?: No.    Education has been provided regarding the importance of this vaccine. Patient has been advised to call insurance company to determine out of pocket expense if they have not yet received this vaccine. Advised may also receive vaccine at local pharmacy or Health Dept. Verbalized acceptance and understanding.  Screening Tests Health Maintenance  Topic Date Due  . DEXA SCAN  Never done  . PNA vac Low Risk Adult (1 of 2 - PCV13) 07/10/2018  . INFLUENZA VACCINE  07/28/2020  . FOOT EXAM  10/29/2020  . URINE MICROALBUMIN  10/29/2020  . OPHTHALMOLOGY EXAM  12/29/2020  . HEMOGLOBIN A1C  02/21/2021  . MAMMOGRAM  02/11/2022  . TETANUS/TDAP  03/30/2026  . COLONOSCOPY  04/01/2026  . COVID-19 Vaccine  Completed  . Hepatitis C Screening  Completed    Health Maintenance  Health Maintenance Due  Topic Date Due  . DEXA SCAN  Never done  . PNA vac Low Risk Adult (1 of 2 - PCV13) 07/10/2018  . INFLUENZA VACCINE  07/28/2020    Colorectal cancer screening: Completed 04/01/2016. Repeat every 10 years Mammogram status: Completed 02/12/2020. Repeat every year Bone Density status: due, will have provider order at today's visit.   Lung Cancer Screening: (Low Dose CT Chest recommended if Age 3-80 years, 30 pack-year currently smoking OR have quit w/in 15 years.) does not qualify.    Additional Screening:  Hepatitis C Screening: does qualify; Completed 11/23/2017  Vision Screening: Recommended annual ophthalmology exams for early detection of  glaucoma and other disorders of the eye. Is the patient up to date with their annual eye exam?  Yes  Who is the provider or what is the name of the office in which the patient attends annual eye exams? [facility] If pt is not established with a provider, would they like to be referred to a provider to establish care? No .   Dental Screening: Recommended annual dental exams for proper oral hygiene  Community Resource Referral / Chronic Care Management: CRR required this visit?  No   CCM required this visit?  No      Plan:     I have personally reviewed and noted the following in the patient's chart:   . Medical and social history . Use of alcohol, tobacco or illicit drugs  . Current medications and supplements . Functional ability and status . Nutritional status . Physical activity . Advanced directives . List of other physicians . Hospitalizations, surgeries, and ER visits in previous 12 months . Vitals . Screenings to include cognitive, depression, and falls . Referrals and appointments  In addition, I have reviewed and discussed with patient certain preventive protocols, quality metrics, and best practice recommendations. A written personalized care plan for preventive services as well as general preventive health recommendations were provided to patient.   Due to this being a telephonic visit, the after visit summary with patients personalized plan was offered to patient via mail or my-chart. Patient preferred to pick up at office at next visit.   Andrez Grime, LPN   8/34/1960

## 2020-08-23 NOTE — Progress Notes (Signed)
PCP notes:  Health Maintenance: Flu- due Prevnar 13- due Shingrix- due Dexa- due   Abnormal Screenings: none   Patient concerns: Discuss low back pain  Discuss changing pharmacy to [facility] on [address]    Nurse concerns: none   Next PCP appt.: Today @ 3 pm

## 2020-09-23 ENCOUNTER — Ambulatory Visit: Payer: Medicare HMO | Admitting: Family Medicine

## 2020-09-23 ENCOUNTER — Other Ambulatory Visit: Payer: Self-pay

## 2020-09-23 ENCOUNTER — Telehealth: Payer: Medicare HMO | Admitting: Family Medicine

## 2020-10-16 ENCOUNTER — Encounter: Payer: Self-pay | Admitting: Family Medicine

## 2020-10-16 ENCOUNTER — Ambulatory Visit: Payer: Medicare HMO | Admitting: Family Medicine

## 2020-10-16 ENCOUNTER — Other Ambulatory Visit: Payer: Self-pay

## 2020-10-16 VITALS — BP 177/66 | Ht 61.0 in | Wt 215.0 lb

## 2020-10-16 DIAGNOSIS — M545 Low back pain, unspecified: Secondary | ICD-10-CM

## 2020-10-16 DIAGNOSIS — G8929 Other chronic pain: Secondary | ICD-10-CM | POA: Diagnosis not present

## 2020-10-16 NOTE — Progress Notes (Signed)
   PCP: Pleas Koch, NP  Subjective:   HPI: Patient is a 69 y.o. female here for evaluation of back and hip pain.  Patient states that she has had chronic intermittent pains in her back over the last 10 years, however over the last several months she has had progressive worsening.  She reports that she has pain in her low back which is worse with prolonged standing and vacuuming.  Is relieved with flexion of her lumbar spine.  She also has relief when she sits down.  She also notes that over the last few years she has had progressively worsening bilateral lateral hip pain.  She reports that when she is walking, she will have no pain for about 100 yards but then will have onset of bilateral posterior lateral hip pain over her glutes and into her lateral hip.  It progressively worsens with time and then is relieved after she sits down for 2 to 3 minutes.  She does not have as much pain when she is walking in the grocery store, she is not sure exactly how much she is leaning on the grocery cart.  She denies any numbness or tingling going down her legs.  She denies any leg weakness.  She has not had any recent trauma to her back or hips.  Review of Systems:  Per HPI.   [place], medications and smoking status reviewed.      Objective:  Physical Exam:  [place] Adult Exercise 10/16/2020  Frequency of aerobic exercise (# of days/week) 0  Average time in minutes 0  Frequency of strengthening activities (# of days/week) 0     Gen: awake, alert, NAD, comfortable in exam room Pulm: breathing unlabored  Lumbar spine/Hips:  Inspection: No evidence of erythema, ecchymosis, swelling edema.  Palpation: No midline spinal tenderness. Nontender to facets.  Very mild paraspinal tenderness of the lumbar spines.  Mild tenderness to palpation of bilateral greater trochanter.  Nontender to SI joints.  ROM: Intact to forward flexion, extension, rotation, and bending.  Some pain with  extension. Strength: 5/5 strength with flexion, extension of the hip and lumbar spine. Special tests: Neg straight leg raise, Neg FABER, neg FADIR bilaterally     Assessment & Plan:  1. Low back pain  Differential includes lumbar radiculopathy, spinal stenosis, musculoskeletal low back pain, vascular claudication.  Pt with pain that is mostly with extension and relieved with flexion, raising the question of spinal stenosis. She is also describing some symptoms of neurogenic claudication with the pain in her hips that comes on after walking.  She does have some risk factors for vascular claudication with diabetes, hyperlipidemia and obesity however I believe this to be less likely.    We will plan to start with x-ray lumbar spine, physical therapy for spinal stenosis with focus on flexion exercises, and follow-up in 4 to 6 weeks.  If these conservative measures are not helpful, would consider MRI of the lumbar spine for further evaluation of spinal stenosis versus radiculopathy and consideration of epidural steroid injection versus surgery.  Dagoberto Ligas, MD [facility] Fellow 10/16/2020 3:57 PM

## 2020-10-18 ENCOUNTER — Ambulatory Visit
Admission: RE | Admit: 2020-10-18 | Discharge: 2020-10-18 | Disposition: A | Discharge status: Home / Self Care | Payer: Medicare HMO | Attending: Family Medicine | Admitting: Family Medicine

## 2020-10-18 ENCOUNTER — Ambulatory Visit
Admission: RE | Admit: 2020-10-18 | Discharge: 2020-10-18 | Disposition: A | Discharge status: Home / Self Care | Payer: Medicare HMO | Source: Ambulatory Visit | Attending: Family Medicine | Admitting: Family Medicine

## 2020-10-18 DIAGNOSIS — G8929 Other chronic pain: Secondary | ICD-10-CM | POA: Insufficient documentation

## 2020-10-18 DIAGNOSIS — M545 Low back pain, unspecified: Secondary | ICD-10-CM | POA: Diagnosis not present

## 2020-10-18 IMAGING — CR DG LUMBAR SPINE 2-3V
3 series · 3 of 3 positions shown · non-contrast
Comparison: None.

CLINICAL DATA: Low back pain.

EXAM:
LUMBAR SPINE - 2-3 VIEW

[l-spine ap]
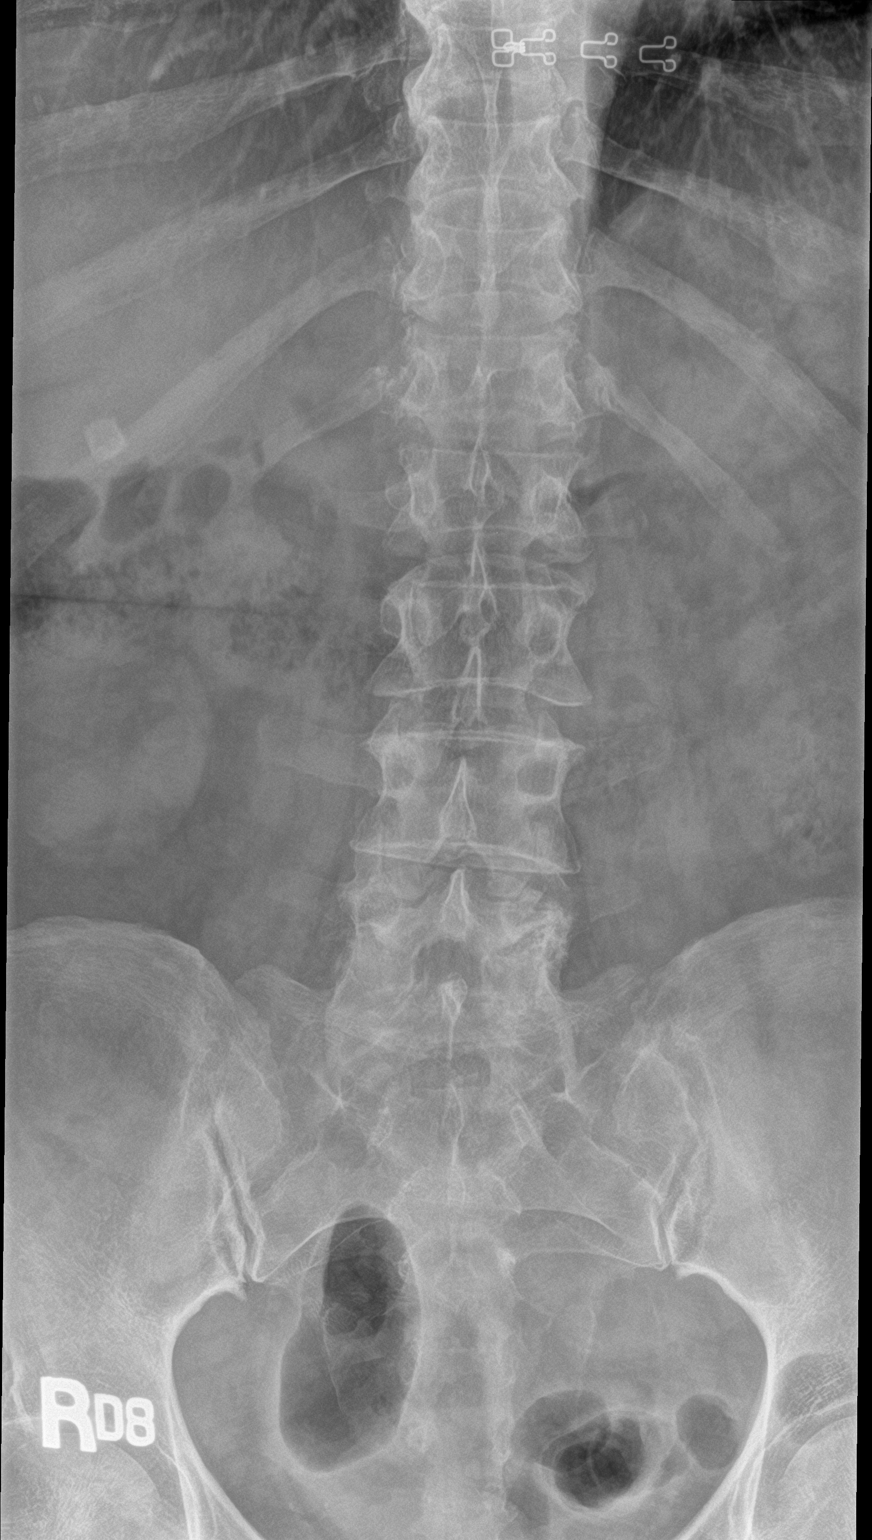

[l-spine lat]
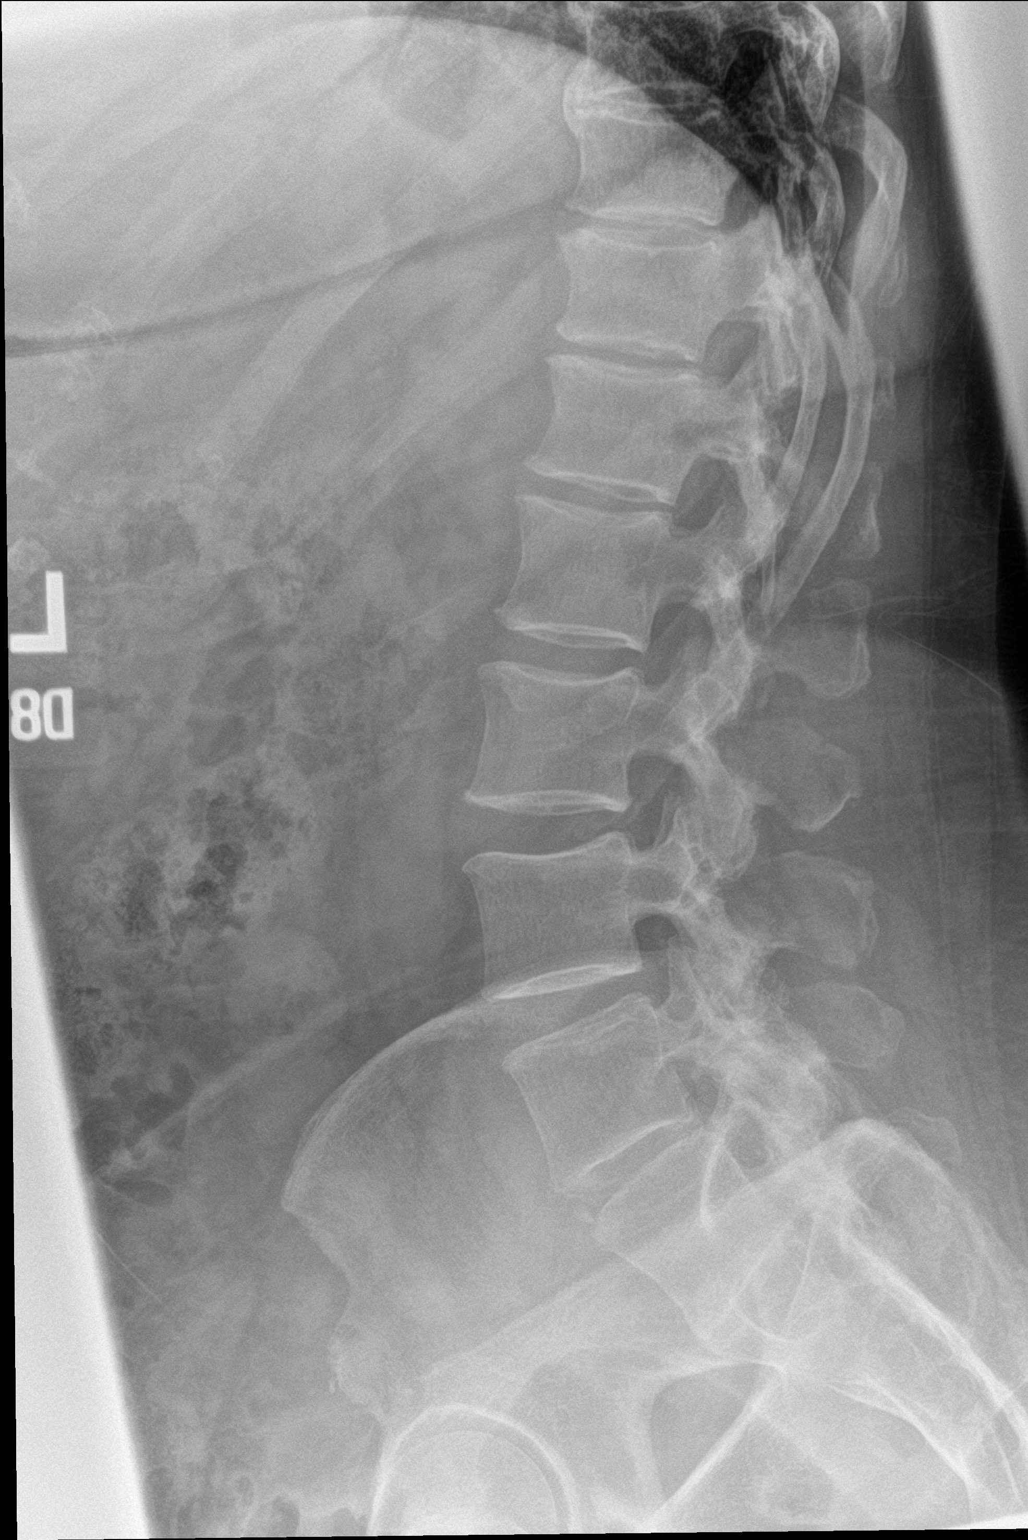

[l-spine spot]
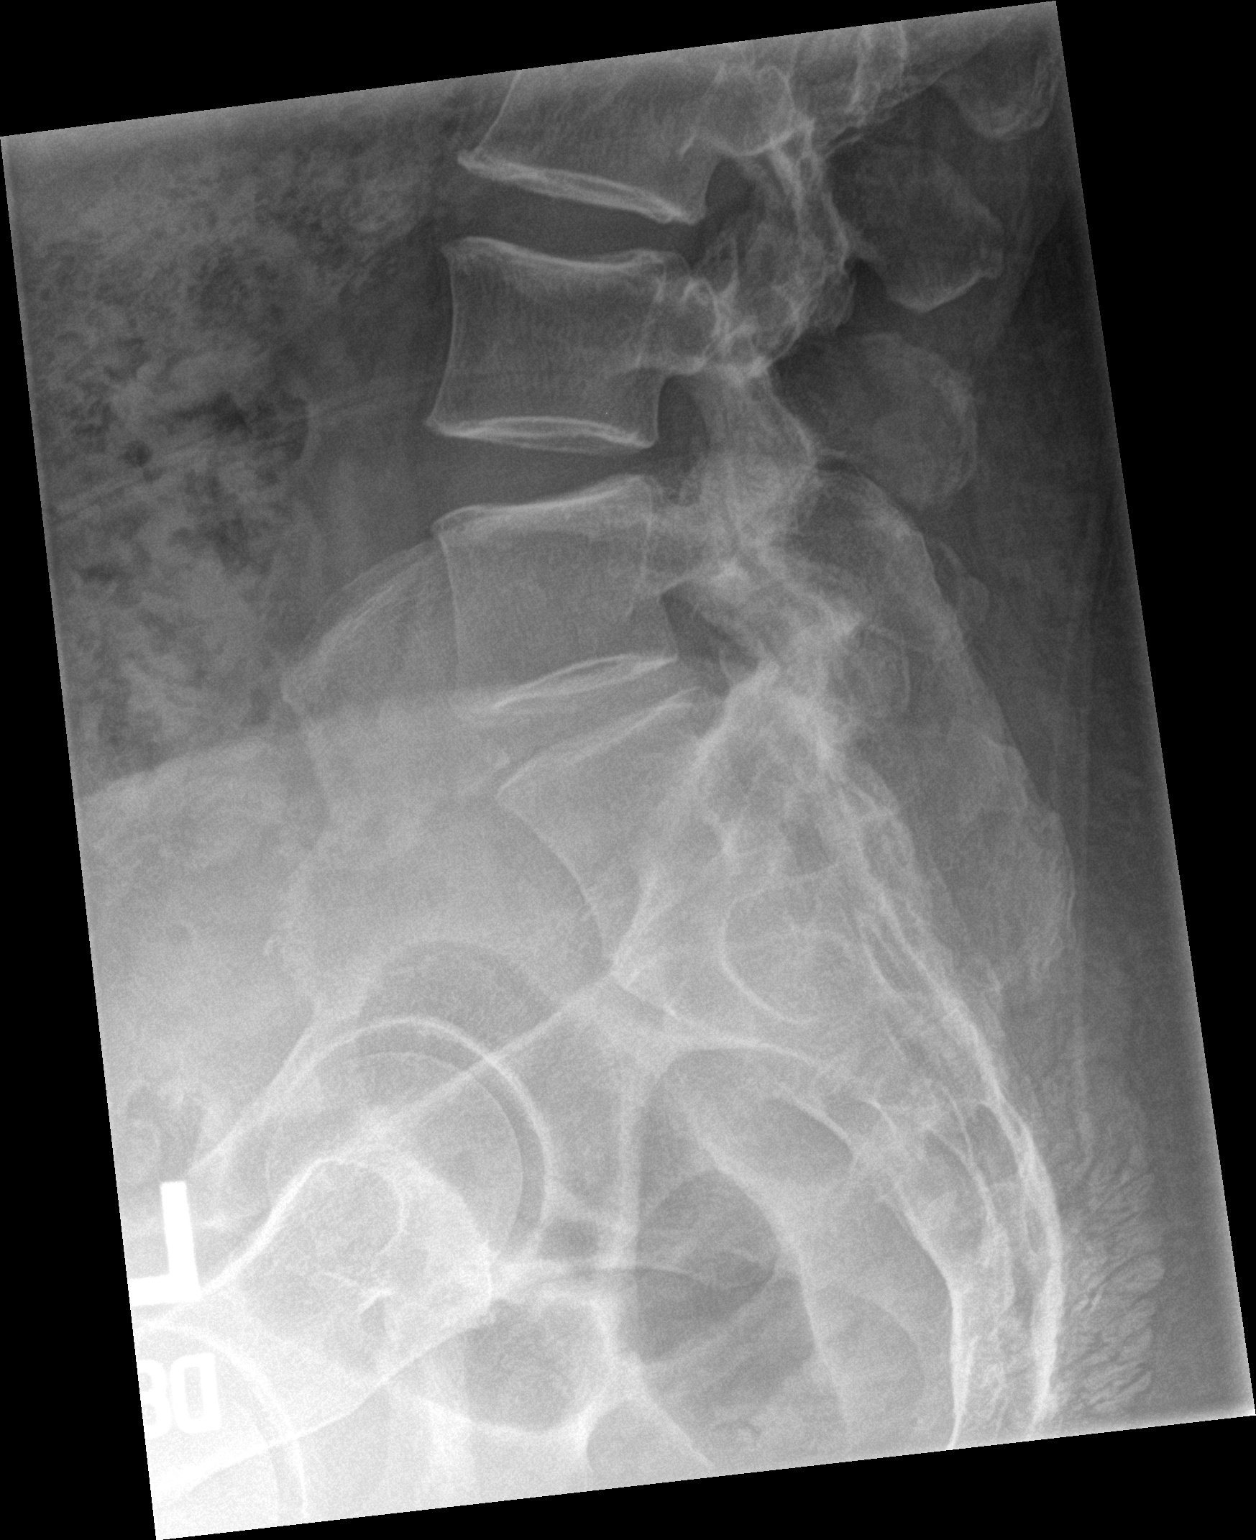

[3 of 3 positions shown; findings below may reference images not displayed]

FINDINGS: Normal alignment of the lumbar spine. The vertebral body heights are
well preserved. Mild lower lumbar spine facet arthropathy noted.
Disc space narrowing and endplate spurring is noted within the lower
thoracic and upper lumbar spine. Gallstone is identified within the
right upper quadrant of the abdomen.
IMPRESSION: 1. No acute findings.
2. Degenerative disc disease and facet arthropathy.
3. Cholelithiasis.

## 2020-10-21 DIAGNOSIS — L821 Other seborrheic keratosis: Secondary | ICD-10-CM | POA: Diagnosis not present

## 2020-10-21 DIAGNOSIS — Z872 Personal history of diseases of the skin and subcutaneous tissue: Secondary | ICD-10-CM | POA: Diagnosis not present

## 2020-10-21 DIAGNOSIS — Z85828 Personal history of other malignant neoplasm of skin: Secondary | ICD-10-CM | POA: Diagnosis not present

## 2020-10-21 DIAGNOSIS — B372 Candidiasis of skin and nail: Secondary | ICD-10-CM | POA: Diagnosis not present

## 2020-10-21 DIAGNOSIS — L700 Acne vulgaris: Secondary | ICD-10-CM | POA: Diagnosis not present

## 2020-10-21 DIAGNOSIS — L578 Other skin changes due to chronic exposure to nonionizing radiation: Secondary | ICD-10-CM | POA: Diagnosis not present

## 2020-11-04 DIAGNOSIS — L700 Acne vulgaris: Secondary | ICD-10-CM | POA: Diagnosis not present

## 2020-11-04 DIAGNOSIS — H401133 Primary open-angle glaucoma, bilateral, severe stage: Secondary | ICD-10-CM | POA: Diagnosis not present

## 2020-11-08 DIAGNOSIS — H401133 Primary open-angle glaucoma, bilateral, severe stage: Secondary | ICD-10-CM | POA: Diagnosis not present

## 2020-11-08 LAB — HM DIABETES EYE EXAM

## 2020-11-14 ENCOUNTER — Encounter: Payer: Self-pay | Admitting: Primary Care

## 2020-11-19 DIAGNOSIS — M5136 Other intervertebral disc degeneration, lumbar region: Secondary | ICD-10-CM | POA: Diagnosis not present

## 2020-11-19 DIAGNOSIS — S335XXD Sprain of ligaments of lumbar spine, subsequent encounter: Secondary | ICD-10-CM | POA: Diagnosis not present

## 2020-11-22 DIAGNOSIS — E119 Type 2 diabetes mellitus without complications: Secondary | ICD-10-CM

## 2020-11-23 MED ORDER — BD PEN NEEDLE SHORT U/F 31G X 8 MM MISC: 1 refills | Status: DC

## 2020-11-26 ENCOUNTER — Ambulatory Visit: Payer: Medicare HMO

## 2020-11-26 DIAGNOSIS — S335XXD Sprain of ligaments of lumbar spine, subsequent encounter: Secondary | ICD-10-CM | POA: Diagnosis not present

## 2020-11-26 DIAGNOSIS — M5136 Other intervertebral disc degeneration, lumbar region: Secondary | ICD-10-CM | POA: Diagnosis not present

## 2020-11-27 ENCOUNTER — Ambulatory Visit: Payer: Medicare HMO

## 2020-11-27 DIAGNOSIS — K1370 Unspecified lesions of oral mucosa: Secondary | ICD-10-CM | POA: Diagnosis not present

## 2020-12-02 DIAGNOSIS — S335XXD Sprain of ligaments of lumbar spine, subsequent encounter: Secondary | ICD-10-CM | POA: Diagnosis not present

## 2020-12-02 DIAGNOSIS — M5136 Other intervertebral disc degeneration, lumbar region: Secondary | ICD-10-CM | POA: Diagnosis not present

## 2021-01-01 ENCOUNTER — Other Ambulatory Visit: Payer: Self-pay | Admitting: Primary Care

## 2021-01-01 DIAGNOSIS — E119 Type 2 diabetes mellitus without complications: Secondary | ICD-10-CM

## 2021-01-01 NOTE — Telephone Encounter (Signed)
Last OV 08/23/20 Last fill 08/23/20  #41ml/1 Next OV 02/24/21

## 2021-02-24 ENCOUNTER — Ambulatory Visit: Payer: Medicare HMO | Admitting: Primary Care

## 2021-02-26 ENCOUNTER — Ambulatory Visit (INDEPENDENT_AMBULATORY_CARE_PROVIDER_SITE_OTHER): Payer: Medicare HMO | Admitting: Primary Care

## 2021-02-26 ENCOUNTER — Other Ambulatory Visit: Payer: Self-pay

## 2021-02-26 ENCOUNTER — Encounter: Payer: Self-pay | Admitting: Primary Care

## 2021-02-26 VITALS — BP 136/78 | HR 73 | Temp 97.7°F | Ht 61.0 in | Wt 213.0 lb

## 2021-02-26 DIAGNOSIS — E119 Type 2 diabetes mellitus without complications: Secondary | ICD-10-CM

## 2021-02-26 DIAGNOSIS — E2839 Other primary ovarian failure: Secondary | ICD-10-CM

## 2021-02-26 LAB — POCT GLYCOSYLATED HEMOGLOBIN (HGB A1C): Hemoglobin A1C: 6.7 % — AB (ref 4.0–5.6)

## 2021-02-26 MED ORDER — BD PEN NEEDLE SHORT U/F 31G X 8 MM MISC: 1 refills | Status: DC

## 2021-02-26 MED ORDER — BASAGLAR KWIKPEN 100 UNIT/ML ~~LOC~~ SOPN: PEN_INJECTOR | SUBCUTANEOUS | 3 refills | Status: DC

## 2021-02-26 NOTE — Progress Notes (Signed)
Subjective:    Patient ID: Valerie Gordon, female    DOB: 10-19-1951, 70 y.o.   MRN: [id number]  HPI   This visit occurred during the SARS-CoV-2 public health emergency.  Safety protocols were in place, including screening questions prior to the visit, additional usage of staff PPE, and extensive cleaning of exam room while observing appropriate contact time as indicated for disinfecting solutions.   Ms. Valerie Gordon is a 70 year old female with a history of type 2 diabetes, hypothyroidism, hyperlipidemia who presents today for follow-up of diabetes.  Current medications include: Metformin 1000 mg twice daily, Basaglar 25 units daily. She is injecting between 25-30 units, mostly 30 units due to hyperglycemia.   She is checking her blood glucose infrequently. She last checked her glucose levels a few months ago.   Last A1C: 7.5 in August 2021, 6.7 today. Last Eye Exam: Up-to-date Last Foot Exam: Due Pneumonia Vaccination: 2021 ACE/ARB: None.  Urine microalbumin due. Statin: Rosuvastatin 10 mg.  BP Readings from Last 3 Encounters:  02/26/21 136/78  10/16/20 (!) 177/66  08/23/20 124/72   She is working to reduce portion sizes, increasing veggies/fruit.   Review of Systems  Respiratory: Negative for shortness of breath.   Cardiovascular: Negative for chest pain.  Neurological: Negative for dizziness and numbness.       Past Medical History:  Diagnosis Date  . Cataract   . Depression   . GERD (gastroesophageal reflux disease)   . Glaucoma   . Thyroid disease   . Type 2 diabetes mellitus ([place])    manages with diet and exercise     Social History   Socioeconomic History  . Marital status: Widowed    Spouse name: Not on file  . Number of children: Not on file  . Years of education: Not on file  . Highest education level: Not on file  Occupational History  . Not on file  Tobacco Use  . Smoking status: Never Smoker  . Smokeless tobacco: Never Used  Vaping Use  . Vaping  Use: Never used  Substance and Sexual Activity  . Alcohol use: Yes    Comment: occasional  . Drug use: No  . Sexual activity: Yes  Other Topics Concern  . Not on file  Social History Narrative   Widower.   Step children.   Environmental health practitioner.   Enjoys riding hot air balloons, spending time with friends, reading, jewelry   Social Determinants of Health   Financial Resource Strain: Low Risk   . Difficulty of Paying Living Expenses: Not hard at all  Food Insecurity: No Food Insecurity  . Worried About Charity fundraiser in the Last Year: Never true  . Ran Out of Food in the Last Year: Never true  Transportation Needs: No Transportation Needs  . Lack of Transportation (Medical): No  . Lack of Transportation (Non-Medical): No  Physical Activity: Insufficiently Active  . Days of Exercise per Week: 7 days  . Minutes of Exercise per Session: 10 min  Stress: No Stress Concern Present  . Feeling of Stress : Not at all  Social Connections: Not on file  Intimate Partner Violence: Not At Risk  . Fear of Current or Ex-Partner: No  . Emotionally Abused: No  . Physically Abused: No  . Sexually Abused: No    Past Surgical History:  Procedure Laterality Date  . BREAST BIOPSY  2010  . CARDIAC CATHETERIZATION  1998  . COLONOSCOPY  10-30-2004  . COLONOSCOPY WITH PROPOFOL  N/A 04/01/2016   Procedure: COLONOSCOPY WITH PROPOFOL;  Surgeon: Robert Bellow, MD;  Location[facility] END[facility]COPY;  Service: Endoscopy;  Laterality: N/A;  . EYE SURGERY Bilateral    cataract sx  . TONSILLECTOMY      Family History  Problem Relation Age of Onset  . Stroke Mother   . Heart disease Father     Allergies  Allergen Reactions  . Antihistamines, Chlorpheniramine-Type   . Ciprocin-Fluocin-Procin [Fluocinolone]   . Trulicity [Dulaglutide]     Current Outpatient Medications on File Prior to Visit  Medication Sig Dispense Refill  . b complex vitamins tablet Take 1 tablet by mouth daily.    Marland Kitchen  BIOTIN PO Take 5 mg by mouth.    . Cholecalciferol 50 MCG (2000 UT) CAPS Take by mouth.    Javier Docker Oil 300 MG CAPS Take by mouth.    . latanoprost (XALATAN) 0.005 % ophthalmic solution 1 drop at bedtime.    Marland Kitchen levothyroxine (SYNTHROID) 100 MCG tablet Take 1 tablet by mouth every morning on an empty stomach with water only.  No food or other medications for 30 minutes. 90 tablet 3  . Magnesium 200 MG TABS Take by mouth.    . Menaquinone-7 (VITAMIN K2 PO) Take 19 mcg by mouth.    . metFORMIN (GLUCOPHAGE) 1000 MG tablet Take 1 tablet (1,000 mg total) by mouth 2 (two) times daily with a meal. For diabetes. 180 tablet 3  . rosuvastatin (CRESTOR) 10 MG tablet TAKE 1 TABLET BY MOUTH DAILY. FOR CHOLESTEROL. 90 tablet 3  . VITAMIN A PO Take 2,400 mcg by mouth.     No current facility-administered medications on file prior to visit.    BP 136/78   Pulse 73   Temp 97.7 F (36.5 C) (Temporal)   Ht 5\' 1"  (1.549 m)   Wt 213 lb (96.6 kg)   SpO2 98%   BMI 40.25 kg/m    Objective:   Physical Exam Constitutional:      Appearance: She is well-nourished.  Cardiovascular:     Rate and Rhythm: Normal rate and regular rhythm.  Pulmonary:     Effort: Pulmonary effort is normal.     Breath sounds: Normal breath sounds.  Musculoskeletal:     Cervical back: Neck supple.  Skin:    General: Skin is warm and dry.  Psychiatric:        Mood and Affect: Mood and affect and mood normal.            Assessment & Plan:

## 2021-02-26 NOTE — Assessment & Plan Note (Signed)
Well-controlled with A1c of 6.7 today.  Continue Basaglar 30 units daily, continue Metformin 1000 mg twice daily.  We discussed the absolute need to check glucose levels, especially when injecting insulin.  She will continue to work on her diet, recommended regular activity/exercise.  Foot exam today. Eye exam up-to-date. Urine microalbumin due, pending Managed on statin therapy. Pneumonia vaccine up-to-date.  Follow-up in 6 months.

## 2021-02-26 NOTE — Patient Instructions (Signed)
Continue to work on a healthy diet.  Be sure to get regular exercise, at least 30 minutes 5 days weekly.  Start checking your blood sugar levels at least once daily.  Rotate times that you check. Appropriate times to check your blood sugar levels are:  -Before any meal (breakfast, lunch, dinner) -Two hours after any meal (breakfast, lunch, dinner) -Bedtime  Continue Basaglar 30 units daily, Metformin 1000 mg twice daily.  Please schedule your physical for 6 months.  It was a pleasure to see you today!

## 2021-02-27 LAB — MICROALBUMIN / CREATININE URINE RATIO
Creatinine,U: 46.1 mg/dL
Microalb Creat Ratio: 1.5 mg/g (ref 0.0–30.0)
Microalb, Ur: <0.7 mg/dL (ref 0.0–1.9)

## 2021-03-24 DIAGNOSIS — L918 Other hypertrophic disorders of the skin: Secondary | ICD-10-CM | POA: Diagnosis not present

## 2021-03-24 DIAGNOSIS — L821 Other seborrheic keratosis: Secondary | ICD-10-CM | POA: Diagnosis not present

## 2021-03-24 DIAGNOSIS — L570 Actinic keratosis: Secondary | ICD-10-CM | POA: Diagnosis not present

## 2021-03-24 DIAGNOSIS — Z872 Personal history of diseases of the skin and subcutaneous tissue: Secondary | ICD-10-CM | POA: Diagnosis not present

## 2021-03-24 DIAGNOSIS — L578 Other skin changes due to chronic exposure to nonionizing radiation: Secondary | ICD-10-CM | POA: Diagnosis not present

## 2021-03-24 DIAGNOSIS — Z85828 Personal history of other malignant neoplasm of skin: Secondary | ICD-10-CM | POA: Diagnosis not present

## 2021-05-09 DIAGNOSIS — H401133 Primary open-angle glaucoma, bilateral, severe stage: Secondary | ICD-10-CM | POA: Diagnosis not present

## 2021-05-12 DIAGNOSIS — E119 Type 2 diabetes mellitus without complications: Secondary | ICD-10-CM

## 2021-05-13 ENCOUNTER — Telehealth: Payer: Medicare HMO | Admitting: Physician Assistant

## 2021-05-13 DIAGNOSIS — R197 Diarrhea, unspecified: Secondary | ICD-10-CM

## 2021-05-13 NOTE — Progress Notes (Signed)
Based on what you shared with me, I feel your condition warrants further evaluation and I recommend that you be seen in a face to face office visit. Giving chronicity of symptoms you need a further evaluation either in person with PCP or at Urgent Care or at least to start with a video with your primary care provider. You will need to give their office a call to get this scheduled.    NOTE: If you entered your credit card information for this eVisit, you will not be charged. You may see a "hold" on your card for the $35 but that hold will drop off and you will not have a charge processed.   If you are having a true medical emergency please call 911.      For an urgent face to face visit, [facility] has six urgent care centers for your convenience:     [facility] [place] [facility] [place] Get Driving Directions [phone] [address] [place], [place] [address] . 8 am - 4 pm Monday - Friday    [facility]) Get Driving Directions [phone] [address] [place], [place] [address] . 8 am to 8 pm Monday-Friday . 10 am to 6 pm [facility] ([place]) Get Driving Directions [phone]  [address] [place],  [place]  [address] . 8 am to 8 pm Monday-Friday . 8 am to 4 pm [facility] at [facility] Get Driving Directions [phone] [place], [place], [place] [address] . 8 am to 8 pm Monday-Friday . 8 am to 4 pm [facility] at [facility] Get Driving Directions  [phone] [address] [place], [place] [address] . 8 am to 8 pm Monday-Friday . 8 am to 4 pm [facility] at [place] Get Driving Directions [phone] [address], [place], [place] [address] . 8 am to 8 pm Monday-Friday . 8 am to 4 pm  Saturday-Sunday     Your MyChart E-visit questionnaire answers were reviewed by a board certified advanced clinical practitioner to complete your personal care plan based on your specific symptoms.  Thank you for using e-Visits.

## 2021-05-13 NOTE — Telephone Encounter (Signed)
Per chart review tab pt started with Evisit with Raiford Noble PA with [facility] who recommended face to face visit with PCP or UC or video visit with PCP. Pt has already scheduled video visit with Gentry Fitz  NP on 05/14/21 at 3:20. Sending note to Gentry Fitz NP and [facility] CMA.

## 2021-05-13 NOTE — Telephone Encounter (Signed)
Pt left v/m requesting response to my chart message sent on 05/12/21. I called pt back and left v/m requesting cb. I spoke with Joellen CMA and she said 05/14/21 at 3:20 could be used as VV.

## 2021-05-14 ENCOUNTER — Other Ambulatory Visit (INDEPENDENT_AMBULATORY_CARE_PROVIDER_SITE_OTHER): Payer: Medicare HMO

## 2021-05-14 ENCOUNTER — Telehealth (INDEPENDENT_AMBULATORY_CARE_PROVIDER_SITE_OTHER): Payer: Medicare HMO | Admitting: Primary Care

## 2021-05-14 ENCOUNTER — Other Ambulatory Visit: Payer: Self-pay

## 2021-05-14 ENCOUNTER — Encounter: Payer: Self-pay | Admitting: Primary Care

## 2021-05-14 VITALS — Ht 61.0 in | Wt 214.0 lb

## 2021-05-14 DIAGNOSIS — R197 Diarrhea, unspecified: Secondary | ICD-10-CM

## 2021-05-14 DIAGNOSIS — E119 Type 2 diabetes mellitus without complications: Secondary | ICD-10-CM

## 2021-05-14 HISTORY — DX: Diarrhea, unspecified: R19.7

## 2021-05-14 MED ORDER — BASAGLAR KWIKPEN 100 UNIT/ML ~~LOC~~ SOPN: 32.0000 [IU] | PEN_INJECTOR | Freq: Every day | SUBCUTANEOUS | 3 refills | Status: DC

## 2021-05-14 NOTE — Progress Notes (Signed)
Patient ID: Valerie Gordon, female    DOB: [birth year], 70 y.o.   MRN: [id number]  Virtual visit completed through [facility], a video enabled telemedicine application. Due to national recommendations of social distancing due to COVID-19, a virtual visit is felt to be most appropriate for this patient at this time. Reviewed limitations, risks, security and privacy concerns of performing a virtual visit and the availability of in person appointments. I also reviewed that there may be a patient responsible charge related to this service. The patient agreed to proceed.   Patient location: home Provider location: [facility] at [facility], office Persons participating in this virtual visit: patient, provider   If any vitals were documented, they were collected by patient at home unless specified below.    Ht 5\' 1"  (1.549 m)   Wt 214 lb (97.1 kg)   BMI 40.43 kg/m    CC: Diarrhea Subjective:   HPI: Valerie Gordon is a 70 y.o. female with a history of type 2 diabetes, hypothyroidism, GERD, hyperlipidemia presenting on 05/14/2021 for diarrhea.  Symptoms began about two weeks ago just after returning from a cruise.  The cruise left from [place] and went to [place] and [place].  She had no symptoms prior to her departure.  Her diarrhea is watery/liquid that is now occurring 3-4 times daily, previously occurring 10 times daily, gradually improving. She's been trying to hydrate well with water and coconut water. Her appetite is good, but has diarrhea within 1 hour after eating.   During her trip she may try to take precautions against contamination including drinking bottled water and making sure to wash her hands. She denies abdominal pain, nausea, vomiting, chills, fatigue. She had a fever for the first few days along with "flu like symptoms", but the symptoms passed within 2 days. She tested negative for Covid-19 two days after symptoms began.   She is needing a refill of her Lantus insulin for  which she is injecting 32 units daily.      Relevant past medical, surgical, family and social history reviewed and updated as indicated. Interim medical history since our last visit reviewed. Allergies and medications reviewed and updated. Outpatient Medications Prior to Visit  Medication Sig Dispense Refill  . b complex vitamins tablet Take 1 tablet by mouth daily.    Marland Kitchen BIOTIN PO Take 5 mg by mouth.    . Cholecalciferol 50 MCG (2000 UT) CAPS Take by mouth.    . Insulin Pen Needle (B-D ULTRAFINE III SHORT PEN) 31G X 8 MM MISC USE NIGHTLY WITH INSULIN 100 each 1  . Krill Oil 300 MG CAPS Take by mouth.    . latanoprost (XALATAN) 0.005 % ophthalmic solution 1 drop at bedtime.    Marland Kitchen levothyroxine (SYNTHROID) 100 MCG tablet Take 1 tablet by mouth every morning on an empty stomach with water only.  No food or other medications for 30 minutes. 90 tablet 3  . Magnesium 200 MG TABS Take by mouth.    . Menaquinone-7 (VITAMIN K2 PO) Take 19 mcg by mouth.    . metFORMIN (GLUCOPHAGE) 1000 MG tablet Take 1 tablet (1,000 mg total) by mouth 2 (two) times daily with a meal. For diabetes. 180 tablet 3  . rosuvastatin (CRESTOR) 10 MG tablet TAKE 1 TABLET BY MOUTH DAILY. FOR CHOLESTEROL. 90 tablet 3  . VITAMIN A PO Take 2,400 mcg by mouth.    . Insulin Glargine (BASAGLAR KWIKPEN) 100 UNIT/ML INJECT 30 UNITS SUBCUTANEOUSLY AT BEDTIME for  diabetes. 15 mL 3   No facility-administered medications prior to visit.     Per HPI unless specifically indicated in ROS section below Review of Systems  Constitutional: Negative for chills, fatigue and fever.  Cardiovascular: Negative for palpitations.  Gastrointestinal: Positive for diarrhea. Negative for blood in stool, nausea and vomiting.   Objective:  Ht 5\' 1"  (1.549 m)   Wt 214 lb (97.1 kg)   BMI 40.43 kg/m   Wt Readings from Last 3 Encounters:  05/14/21 214 lb (97.1 kg)  02/26/21 213 lb (96.6 kg)  10/16/20 215 lb (97.5 kg)       Physical exam: Gen:  alert, NAD, not ill appearing Pulm: speaks in complete sentences without increased work of breathing Psych: normal mood, normal thought content      Results for orders placed or performed in visit on 02/26/21  Microalbumin / creatinine urine ratio  Result Value Ref Range   Microalb, Ur <0.7 0.0 - 1.9 mg/dL   Creatinine,U 46.1 mg/dL   Microalb Creat Ratio 1.5 0.0 - 30.0 mg/g  POCT glycosylated hemoglobin (Hb A1C)  Result Value Ref Range   Hemoglobin A1C 6.7 (A) 4.0 - 5.6 %   HbA1c POC (<> result, manual entry)     HbA1c, POC (prediabetic range)     HbA1c, POC (controlled diabetic range)     Assessment & Plan:   Problem List Items Addressed This Visit      Endocrine   Type 2 diabetes mellitus ([place])   Relevant Medications   Insulin Glargine (BASAGLAR KWIKPEN) 100 UNIT/ML     Other   Diarrhea - Primary    Acute symptoms since returning from a cruise.  Suspicious for contamination versus COVID-19.  She did test negative for COVID 19 last week. Will start with stool studies, CBC with differential, CMP.  Fortunately her symptoms are improving slowly, she appears well and not sickly.  She is maintaining decent hydration.  We discussed the role of metformin and contributing to symptoms, we may need to hold for a few days.  Await results first.      Relevant Orders   CBC with Differential/Platelet   Comprehensive metabolic panel   Gastrointestinal Pathogen Panel PCR       Meds ordered this encounter  Medications  . Insulin Glargine (BASAGLAR KWIKPEN) 100 UNIT/ML    Sig: Inject 32 Units into the skin daily. For diabetes.    Dispense:  30 mL    Refill:  3    Order Specific Question:   Supervising Provider    Answer:   BEDSOLE, AMY E [[id number]]   Orders Placed This Encounter  Procedures  . CBC with Differential/Platelet    Standing Status:   Future    Standing Expiration Date:   05/14/2022  . Comprehensive metabolic panel    Standing Status:   Future    Standing Expiration  Date:   05/14/2022  . Gastrointestinal Pathogen Panel PCR    Standing Status:   Future    Standing Expiration Date:   05/14/2022    I discussed the assessment and treatment plan with the patient. The patient was provided an opportunity to ask questions and all were answered. The patient agreed with the plan and demonstrated an understanding of the instructions. The patient was advised to call back or seek an in-person evaluation if the symptoms worsen or if the condition fails to improve as anticipated.  Follow up plan:  Please call the main office line to set up a  lab appointment as discussed.  I will be in touch with results as soon as they are received.  It was a pleasure to see you today! Allie Bossier, NP-C   Pleas Koch, NP

## 2021-05-14 NOTE — Assessment & Plan Note (Signed)
Acute symptoms since returning from a cruise.  Suspicious for contamination versus COVID-19.  She did test negative for COVID 19 last week. Will start with stool studies, CBC with differential, CMP.  Fortunately her symptoms are improving slowly, she appears well and not sickly.  She is maintaining decent hydration.  We discussed the role of metformin and contributing to symptoms, we may need to hold for a few days.  Await results first.

## 2021-05-15 ENCOUNTER — Other Ambulatory Visit (INDEPENDENT_AMBULATORY_CARE_PROVIDER_SITE_OTHER): Payer: Medicare HMO

## 2021-05-15 DIAGNOSIS — R197 Diarrhea, unspecified: Secondary | ICD-10-CM

## 2021-05-15 LAB — CBC WITH DIFFERENTIAL/PLATELET
Basophils Absolute: 0 10*3/uL (ref 0.0–0.1)
Basophils Relative: 0.4 % (ref 0.0–3.0)
Eosinophils Absolute: 0.5 10*3/uL (ref 0.0–0.7)
Eosinophils Relative: 4.7 % (ref 0.0–5.0)
HCT: 41.1 % (ref 36.0–46.0)
Hemoglobin: 14 g/dL (ref 12.0–15.0)
Lymphocytes Relative: 32.5 % (ref 12.0–46.0)
Lymphs Abs: 3.3 10*3/uL (ref 0.7–4.0)
MCHC: 34 g/dL (ref 30.0–36.0)
MCV: 93 fl (ref 78.0–100.0)
Monocytes Absolute: 0.8 10*3/uL (ref 0.1–1.0)
Monocytes Relative: 7.4 % (ref 3.0–12.0)
Neutro Abs: 5.7 10*3/uL (ref 1.4–7.7)
Neutrophils Relative %: 55 % (ref 43.0–77.0)
Platelets: 267 10*3/uL (ref 150.0–400.0)
RBC: 4.42 Mil/uL (ref 3.87–5.11)
RDW: 13.4 % (ref 11.5–15.5)
WBC: 10.3 10*3/uL (ref 4.0–10.5)

## 2021-05-15 LAB — COMPREHENSIVE METABOLIC PANEL
ALT: 26 U/L (ref 0–35)
AST: 63 U/L — ABNORMAL HIGH (ref 0–37)
Albumin: 4.3 g/dL (ref 3.5–5.2)
Alkaline Phosphatase: 77 U/L (ref 39–117)
BUN: 12 mg/dL (ref 6–23)
CO2: 26 mEq/L (ref 19–32)
Calcium: 10 mg/dL (ref 8.4–10.5)
Chloride: 100 mEq/L (ref 96–112)
Creatinine, Ser: 0.87 mg/dL (ref 0.40–1.20)
GFR: 68.74 mL/min (ref >60.00)
Glucose, Bld: 181 mg/dL — ABNORMAL HIGH (ref 70–99)
Potassium: 4.6 mEq/L (ref 3.5–5.1)
Sodium: 134 mEq/L — ABNORMAL LOW (ref 135–145)
Total Bilirubin: 0.3 mg/dL (ref 0.2–1.2)
Total Protein: 7.3 g/dL (ref 6.0–8.3)

## 2021-05-16 LAB — GASTROINTESTINAL PATHOGEN PANEL PCR
C. difficile Tox A/B, PCR: NOT DETECTED
Campylobacter, PCR: NOT DETECTED
Cryptosporidium, PCR: NOT DETECTED
E coli (ETEC) LT/ST PCR: NOT DETECTED
E coli (STEC) stx1/stx2, PCR: NOT DETECTED
E coli 0157, PCR: NOT DETECTED
Giardia lamblia, PCR: NOT DETECTED
Norovirus, PCR: NOT DETECTED
Rotavirus A, PCR: NOT DETECTED
Salmonella, PCR: NOT DETECTED
Shigella, PCR: NOT DETECTED

## 2021-05-29 NOTE — Telephone Encounter (Signed)
Pt left v/m that she sent pt message on 05/27/21 as instructed  by Anda Kraft per pt. Pt was to give update on vomiting and diarrhea. Pt has not heard back yet and request cb from [place]. Pt will need meds updated based on the information in this note.pt request cb. Sending note to Gentry Fitz NP who is out of office,joellen CMA and Dr Einar Pheasant who is in office.

## 2021-05-30 MED ORDER — TRULICITY 0.75 MG/0.5ML ~~LOC~~ SOAJ: 0.7500 mg | SUBCUTANEOUS | 0 refills | Status: DC

## 2021-05-30 NOTE — Addendum Note (Signed)
Addended by: Pleas Koch on: 05/30/2021 04:00 PM   Modules accepted: Orders

## 2021-06-17 MED ORDER — METFORMIN HCL ER 500 MG PO TB24: ORAL_TABLET | ORAL | 0 refills | Status: DC

## 2021-06-17 NOTE — Addendum Note (Signed)
Addended by: Pleas Koch on: 06/17/2021 01:25 PM   Modules accepted: Orders

## 2021-07-22 DIAGNOSIS — E119 Type 2 diabetes mellitus without complications: Secondary | ICD-10-CM

## 2021-08-13 ENCOUNTER — Other Ambulatory Visit: Payer: Self-pay | Admitting: Primary Care

## 2021-08-13 DIAGNOSIS — E119 Type 2 diabetes mellitus without complications: Secondary | ICD-10-CM

## 2021-08-13 MED ORDER — METFORMIN HCL ER 500 MG PO TB24: ORAL_TABLET | ORAL | 0 refills | Status: DC

## 2021-08-27 ENCOUNTER — Ambulatory Visit (INDEPENDENT_AMBULATORY_CARE_PROVIDER_SITE_OTHER): Payer: Medicare HMO | Admitting: Primary Care

## 2021-08-27 ENCOUNTER — Other Ambulatory Visit: Payer: Self-pay

## 2021-08-27 ENCOUNTER — Encounter: Payer: Self-pay | Admitting: Primary Care

## 2021-08-27 VITALS — BP 134/76 | HR 76 | Temp 98.6°F | Ht 61.0 in | Wt 210.0 lb

## 2021-08-27 DIAGNOSIS — E039 Hypothyroidism, unspecified: Secondary | ICD-10-CM | POA: Diagnosis not present

## 2021-08-27 DIAGNOSIS — E119 Type 2 diabetes mellitus without complications: Secondary | ICD-10-CM | POA: Diagnosis not present

## 2021-08-27 LAB — POCT GLYCOSYLATED HEMOGLOBIN (HGB A1C): Hemoglobin A1C: 7.6 % — AB (ref 4.0–5.6)

## 2021-08-27 MED ORDER — LEVOTHYROXINE SODIUM 100 MCG PO TABS: ORAL_TABLET | ORAL | 0 refills | Status: DC

## 2021-08-27 MED ORDER — METFORMIN HCL ER 500 MG PO TB24: 1000.0000 mg | ORAL_TABLET | Freq: Two times a day (BID) | ORAL | 1 refills | Status: DC

## 2021-08-27 MED ORDER — BD PEN NEEDLE SHORT U/F 31G X 8 MM MISC: 1 refills | Status: DC
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
## 2021-08-27 NOTE — Assessment & Plan Note (Signed)
Refill provided for levothyroxine 100 mcg. Will obtain TSH labs during upcoming CPE.

## 2021-08-27 NOTE — Patient Instructions (Addendum)
It was nice to see you today!  Your A1C was elevated since your last visit, likely due to stopping it when you were sick.  Continue taking 1000 mg Metformin XR twice a day as scheduled and 32 units of Basaglar daily and recheck in 3 months.  Increase your activity level as you can tolerate.  You are due for your physical this month, we have scheduled this for you today.       Diabetes Mellitus and Nutrition, Adult When you have diabetes, or diabetes mellitus, it is very important to have healthy eating habits because your blood sugar (glucose) levels are greatly affected by what you eat and drink. Eating healthy foods in the right amounts, at about the same times every day, can help you: Control your blood glucose. Lower your risk of heart disease. Improve your blood pressure. Reach or maintain a healthy weight. What can affect my meal plan? Every person with diabetes is different, and each person has different needs for a meal plan. Your health care provider may recommend that you work with a dietitian to make a meal plan that is best for you. Your meal plan may vary depending on factors such as: The calories you need. The medicines you take. Your weight. Your blood glucose, blood pressure, and cholesterol levels. Your activity level. Other health conditions you have, such as heart or kidney disease. How do carbohydrates affect me? Carbohydrates, also called carbs, affect your blood glucose level more than any other type of food. Eating carbs naturally raises the amount of glucose in your blood. Carb counting is a method for keeping track of how many carbs you eat. Counting carbs is important to keep your blood glucose at a healthy level, especially if you use insulin or take certain oral diabetes medicines. It is important to know how many carbs you can safely have in each meal. This is different for every person. Your dietitian can help you calculate how many carbs you should have at  each meal and for each snack. How does alcohol affect me? Alcohol can cause a sudden decrease in blood glucose (hypoglycemia), especially if you use insulin or take certain oral diabetes medicines. Hypoglycemia can be a life-threatening condition. Symptoms of hypoglycemia, such as sleepiness, dizziness, and confusion, are similar to symptoms of having too much alcohol. Do not drink alcohol if: Your health care provider tells you not to drink. You are pregnant, may be pregnant, or are planning to become pregnant. If you drink alcohol: Do not drink on an empty stomach. Limit how much you use to: 0-1 drink a day for women. 0-2 drinks a day for men. Be aware of how much alcohol is in your drink. In the [place] one drink equals one 12 oz bottle of beer (355 mL), one 5 oz glass of wine (148 mL), or one 1 oz glass of hard liquor (44 mL). Keep yourself hydrated with water, diet soda, or unsweetened iced tea. Keep in mind that regular soda, juice, and other mixers may contain a lot of sugar and must be counted as carbs. What are tips for following this plan? Reading food labels Start by checking the serving size on the "Nutrition Facts" label of packaged foods and drinks. The amount of calories, carbs, fats, and other nutrients listed on the label is based on one serving of the item. Many items contain more than one serving per package. Check the total grams (g) of carbs in one serving. You can calculate the number  of servings of carbs in one serving by dividing the total carbs by 15. For example, if a food has 30 g of total carbs per serving, it would be equal to 2 servings of carbs. Check the number of grams (g) of saturated fats and trans fats in one serving. Choose foods that have a low amount or none of these fats. Check the number of milligrams (mg) of salt (sodium) in one serving. Most people should limit total sodium intake to less than 2,300 mg per day. Always check the nutrition information of  foods labeled as "low-fat" or "nonfat." These foods may be higher in added sugar or refined carbs and should be avoided. Talk to your dietitian to identify your daily goals for nutrients listed on the label. Shopping Avoid buying canned, pre-made, or processed foods. These foods tend to be high in fat, sodium, and added sugar. Shop around the outside edge of the grocery store. This is where you will most often find fresh fruits and vegetables, bulk grains, fresh meats, and fresh dairy. Cooking Use low-heat cooking methods, such as baking, instead of high-heat cooking methods like deep frying. Cook using healthy oils, such as olive, canola, or sunflower oil. Avoid cooking with butter, cream, or high-fat meats. Meal planning Eat meals and snacks regularly, preferably at the same times every day. Avoid going long periods of time without eating. Eat foods that are high in fiber, such as fresh fruits, vegetables, beans, and whole grains. Talk with your dietitian about how many servings of carbs you can eat at each meal. Eat 4-6 oz (112-168 g) of lean protein each day, such as lean meat, chicken, fish, eggs, or tofu. One ounce (oz) of lean protein is equal to: 1 oz (28 g) of meat, chicken, or fish. 1 egg.  cup (62 g) of tofu. Eat some foods each day that contain healthy fats, such as avocado, nuts, seeds, and fish. What foods should I eat? Fruits Berries. Apples. Oranges. Peaches. Apricots. Plums. Grapes. Mango. Papaya. Pomegranate. Kiwi. Cherries. Vegetables Lettuce. Spinach. Leafy greens, including kale, chard, collard greens, and mustard greens. Beets. Cauliflower. Cabbage. Broccoli. Carrots. Green beans. Tomatoes. Peppers. Onions. Cucumbers. Brussels sprouts. Grains Whole grains, such as whole-wheat or whole-grain bread, crackers, tortillas, cereal, and pasta. Unsweetened oatmeal. Quinoa. Brown or wild rice. Meats and other proteins Seafood. Poultry without skin. Lean cuts of poultry and  beef. Tofu. Nuts. Seeds. Dairy Low-fat or fat-free dairy products such as milk, yogurt, and cheese. The items listed above may not be a complete list of foods and beverages you can eat. Contact a dietitian for more information. What foods should I avoid? Fruits Fruits canned with syrup. Vegetables Canned vegetables. Frozen vegetables with butter or cream sauce. Grains Refined white flour and flour products such as bread, pasta, snack foods, and cereals. Avoid all processed foods. Meats and other proteins Fatty cuts of meat. Poultry with skin. Breaded or fried meats. Processed meat. Avoid saturated fats. Dairy Full-fat yogurt, cheese, or milk. Beverages Sweetened drinks, such as soda or iced tea. The items listed above may not be a complete list of foods and beverages you should avoid. Contact a dietitian for more information. Questions to ask a health care provider Do I need to meet with a diabetes educator? Do I need to meet with a dietitian? What number can I call if I have questions? When are the best times to check my blood glucose? Where to find more information: American Diabetes Association: diabetes.org Academy of Nutrition  and Dietetics: www.eatright.[employer] of Diabetes and Digestive and Kidney Diseases: [employer] Association of Diabetes Care and Education Specialists: www.diabeteseducator.org Summary It is important to have healthy eating habits because your blood sugar (glucose) levels are greatly affected by what you eat and drink. A healthy meal plan will help you control your blood glucose and maintain a healthy lifestyle. Your health care provider may recommend that you work with a dietitian to make a meal plan that is best for you. Keep in mind that carbohydrates (carbs) and alcohol have immediate effects on your blood glucose levels. It is important to count carbs and to use alcohol carefully. This information is not intended to replace advice  given to you by your health care provider. Make sure you discuss any questions you have with your health care provider. Document Revised: 11/21/2019 Document Reviewed: 11/21/2019 Elsevier Patient Education  2021 [employer].

## 2021-08-27 NOTE — Progress Notes (Signed)
Subjective:    Patient ID: Valerie Gordon, female    DOB: 1951-07-30, 70 y.o.   MRN: [id number]  HPI  Valerie Gordon is a very pleasant 70 y.o. female with a history of type 2 diabetes, hypothyroidism, hyperlipidemia who presents today for follow up of diabetes. She is also needing refills of levothyroxine. She is due for CPE.  Current medications include: Metformin XR 500 mg (1000 mg BID), Basaglar 32 units daily. She resumed her Metformin about 2 months ago.   She is checking her blood glucose 2 times daily and is getting readings of 140's to 180's.  Last A1C: 6.7 in March 2022, 7.6 today. Last Eye Exam: UTD Last Foot Exam: Due Pneumonia Vaccination: 2021 Urine Microalbumin: March 2022 Statin: rosuvastatin   Dietary changes since last visit: Healthy diet with salads, fruit, protein.    Exercise: None.    BP Readings from Last 3 Encounters:  08/27/21 134/76  02/26/21 136/78  10/16/20 (!) 177/66      Review of Systems  Eyes:  Negative for visual disturbance.  Respiratory:  Negative for shortness of breath.   Cardiovascular:  Negative for chest pain.  Neurological:  Negative for dizziness and numbness.        Past Medical History:  Diagnosis Date   Cataract    Depression    GERD (gastroesophageal reflux disease)    Glaucoma    Thyroid disease    Type 2 diabetes mellitus ([place])    manages with diet and exercise    Social History   Socioeconomic History   Marital status: Widowed    Spouse name: Not on file   Number of children: Not on file   Years of education: Not on file   Highest education level: Not on file  Occupational History   Not on file  Tobacco Use   Smoking status: Never   Smokeless tobacco: Never  Vaping Use   Vaping Use: Never used  Substance and Sexual Activity   Alcohol use: Yes    Comment: occasional   Drug use: No   Sexual activity: Yes  Other Topics Concern   Not on file  Social History Narrative   Widower.   Step children.    Environmental health practitioner.   Enjoys riding hot air balloons, spending time with friends, reading, jewelry   Social Determinants of Health   Financial Resource Strain: Not on file  Food Insecurity: Not on file  Transportation Needs: Not on file  Physical Activity: Not on file  Stress: Not on file  Social Connections: Not on file  Intimate Partner Violence: Not on file    Past Surgical History:  Procedure Laterality Date   BREAST BIOPSY  2010   [place]  10-30-2004   COLONOSCOPY WITH PROPOFOL N/A 04/01/2016   Procedure: COLONOSCOPY WITH PROPOFOL;  Surgeon: Robert Bellow, MD;  Location: [facility] ENDOSCOPY;  Service: Endoscopy;  Laterality: N/A;   EYE SURGERY Bilateral    cataract sx   TONSILLECTOMY      Family History  Problem Relation Age of Onset   Stroke Mother    Heart disease Father     Allergies  Allergen Reactions   Antihistamines, Chlorpheniramine-Type    Ciprocin-Fluocin-Procin [Fluocinolone]    Trulicity [Dulaglutide]     Current Outpatient Medications on File Prior to Visit  Medication Sig Dispense Refill   b complex vitamins tablet Take 1 tablet by mouth daily.     BIOTIN PO  Take 5 mg by mouth.     Cholecalciferol 50 MCG (2000 UT) CAPS Take by mouth.     Insulin Glargine (BASAGLAR KWIKPEN) 100 UNIT/ML Inject 32 Units into the skin daily. For diabetes. 30 mL 3   Insulin Pen Needle (B-D ULTRAFINE III SHORT PEN) 31G X 8 MM MISC USE NIGHTLY WITH INSULIN 100 each 1   Krill Oil 300 MG CAPS Take by mouth.     latanoprost (XALATAN) 0.005 % ophthalmic solution 1 drop at bedtime.     levothyroxine (SYNTHROID) 100 MCG tablet Take 1 tablet by mouth every morning on an empty stomach with water only.  No food or other medications for 30 minutes. 90 tablet 3   Magnesium 200 MG TABS Take by mouth.     Menaquinone-7 (VITAMIN K2 PO) Take 19 mcg by mouth.     rosuvastatin (CRESTOR) 10 MG tablet TAKE 1 TABLET BY MOUTH DAILY. FOR  CHOLESTEROL. 90 tablet 3   VITAMIN A PO Take 2,400 mcg by mouth.     metFORMIN (GLUCOPHAGE XR) 500 MG 24 hr tablet Take 1 tablet by mouth once daily with breakfast for 2 weeks, then increase to 1 tablet twice daily with meals for diabetes. (Patient taking differently: Take 1,000 mg by mouth in the morning and at bedtime. Take 1 tablet by mouth once daily with breakfast for 2 weeks, then increase to 1 tablet twice daily with meals for diabetes.) 180 tablet 0   No current facility-administered medications on file prior to visit.    BP 134/76   Pulse 76   Temp 98.6 F (37 C) (Temporal)   Ht '5\' 1"'$  (1.549 m)   Wt 210 lb (95.3 kg)   SpO2 98%   BMI 39.68 kg/m  Objective:   Physical Exam Cardiovascular:     Rate and Rhythm: Normal rate and regular rhythm.  Pulmonary:     Effort: Pulmonary effort is normal.     Breath sounds: Normal breath sounds.  Musculoskeletal:     Cervical back: Neck supple.  Skin:    General: Skin is warm and dry.          Assessment & Plan:      This visit occurred during the SARS-CoV-2 public health emergency.  Safety protocols were in place, including screening questions prior to the visit, additional usage of staff PPE, and extensive cleaning of exam room while observing appropriate contact time as indicated for disinfecting solutions.

## 2021-09-05 ENCOUNTER — Ambulatory Visit: Payer: Medicare HMO | Admitting: Primary Care

## 2021-10-14 ENCOUNTER — Telehealth (INDEPENDENT_AMBULATORY_CARE_PROVIDER_SITE_OTHER): Payer: Medicare HMO | Admitting: Primary Care

## 2021-10-14 ENCOUNTER — Other Ambulatory Visit: Payer: Self-pay

## 2021-10-14 ENCOUNTER — Ambulatory Visit
Admission: RE | Admit: 2021-10-14 | Discharge: 2021-10-14 | Disposition: A | Discharge status: Home / Self Care | Payer: Medicare HMO | Source: Ambulatory Visit | Attending: Primary Care | Admitting: Primary Care

## 2021-10-14 ENCOUNTER — Ambulatory Visit
Admission: RE | Admit: 2021-10-14 | Discharge: 2021-10-14 | Disposition: A | Discharge status: Home / Self Care | Payer: Medicare HMO | Attending: Primary Care | Admitting: Primary Care

## 2021-10-14 VITALS — Ht 61.0 in | Wt 210.0 lb

## 2021-10-14 DIAGNOSIS — R059 Cough, unspecified: Secondary | ICD-10-CM | POA: Diagnosis not present

## 2021-10-14 DIAGNOSIS — U071 COVID-19: Secondary | ICD-10-CM

## 2021-10-14 HISTORY — DX: COVID-19: U07.1

## 2021-10-14 IMAGING — CR DG CHEST 2V
1 series · 2 of 2 positions shown · non-contrast
Comparison: Chest x-ray dated [DATE].

CLINICAL DATA: Continued cough post COVID.

EXAM:
CHEST - 2 VIEW

[Series 1: dg chest 2 view · 0.14mm/px · 2 of 2 slices shown]
[im 1/2]
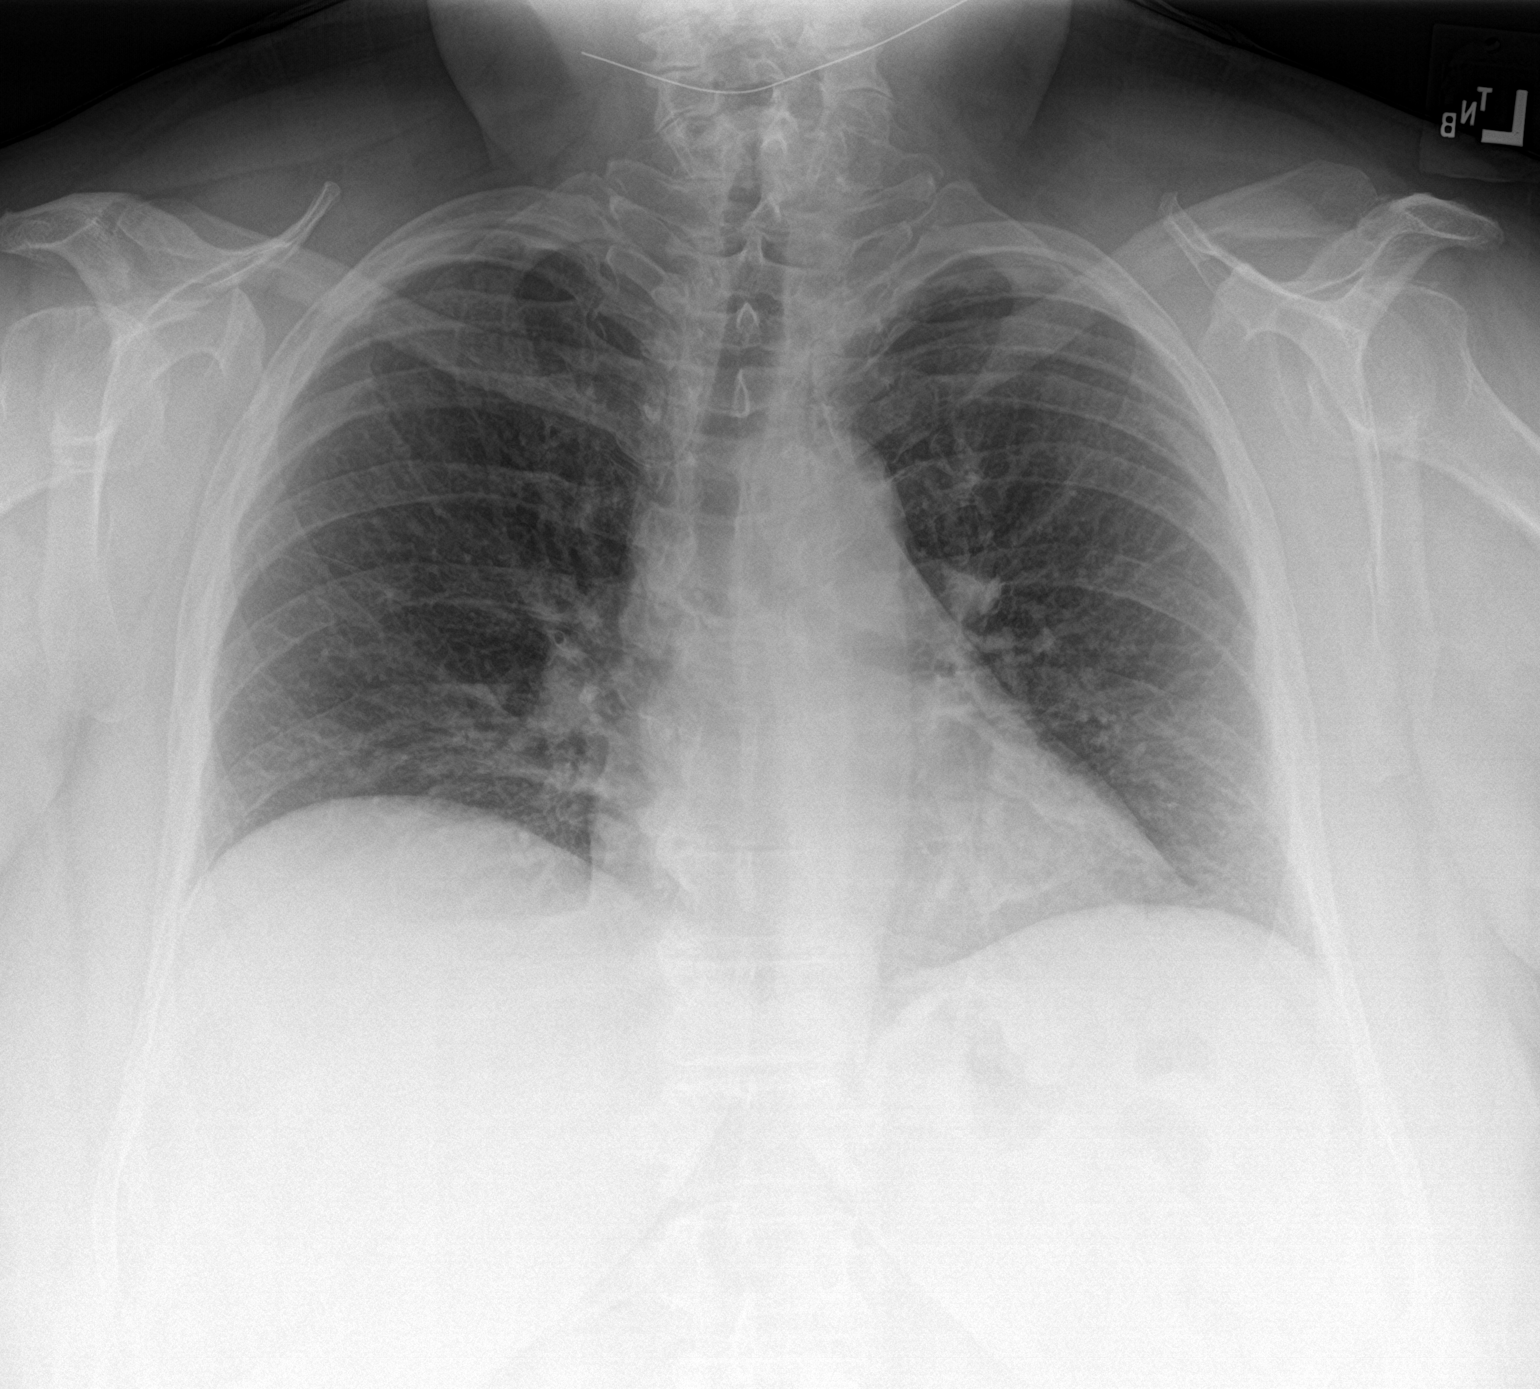
[im 2/2]
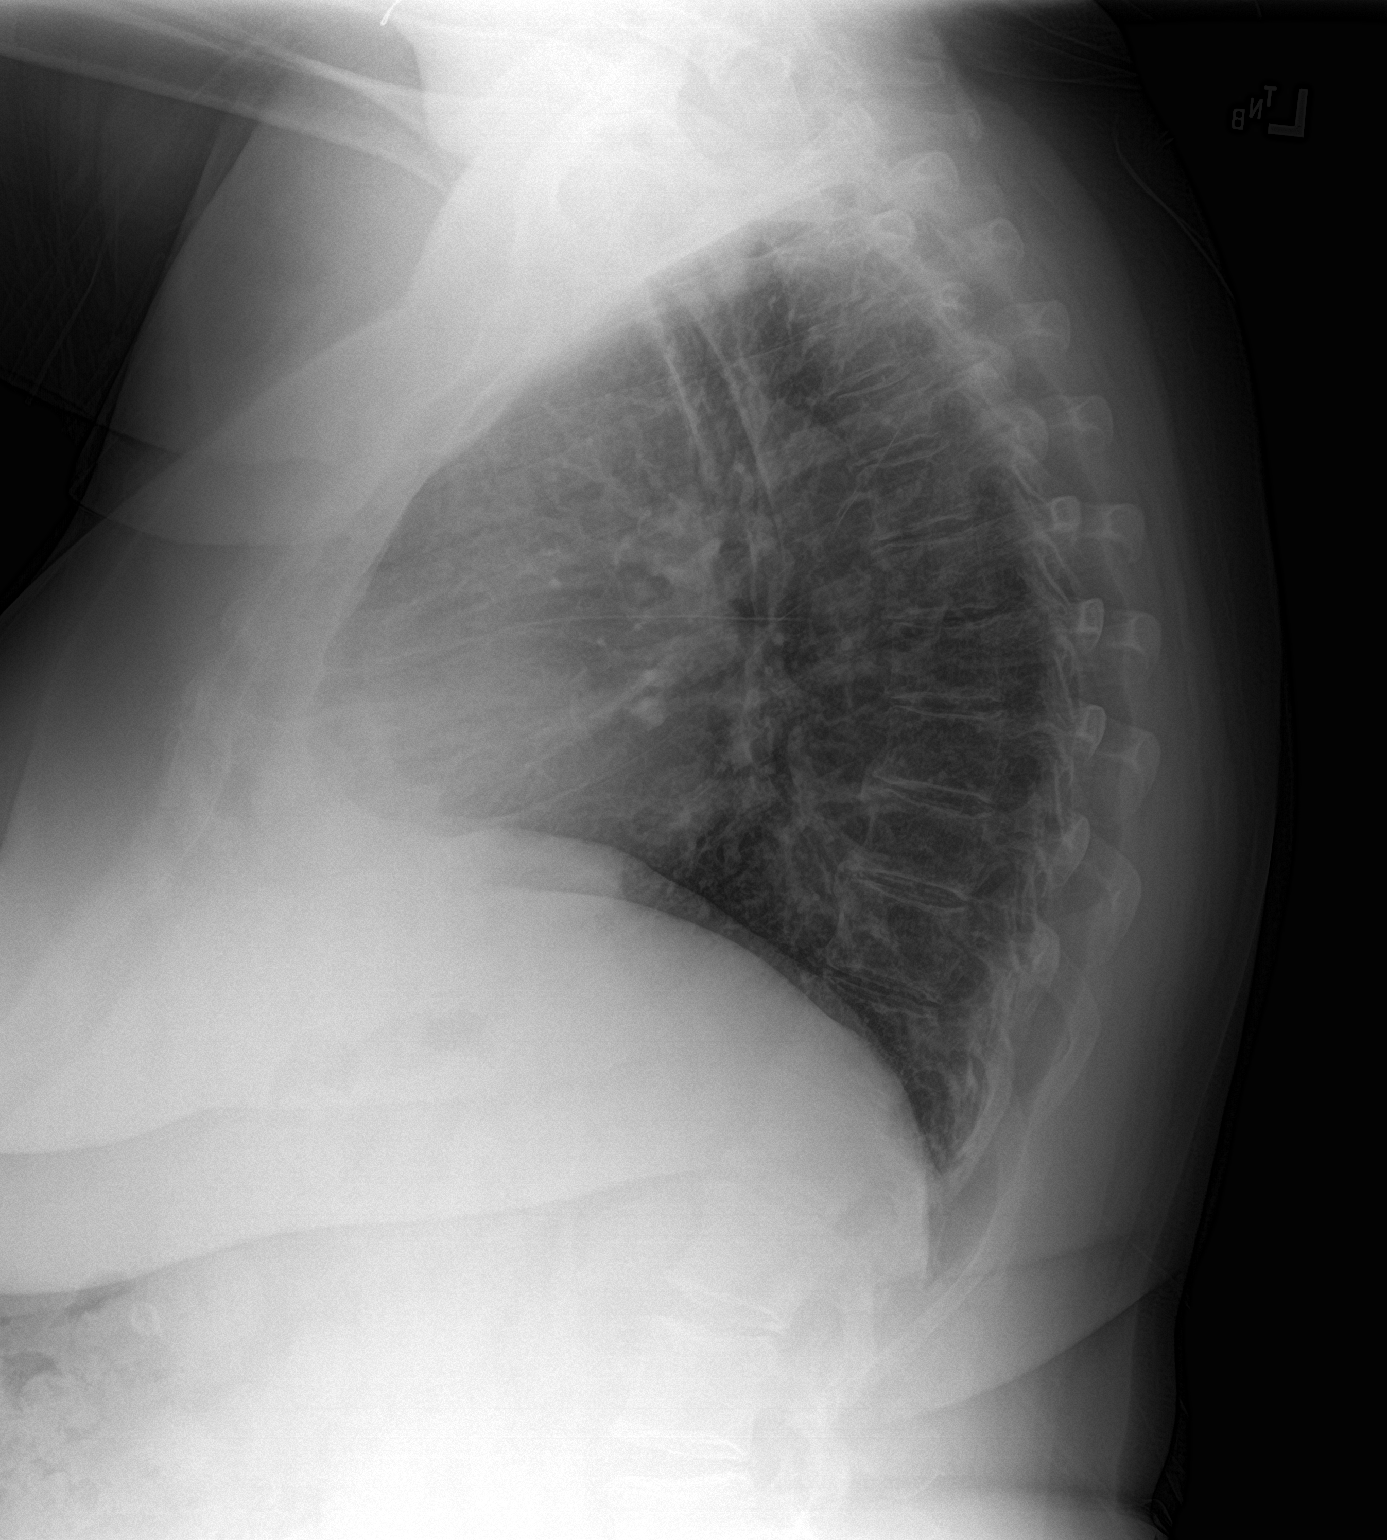

[2 of 2 positions shown; findings below may reference images not displayed]

FINDINGS: The heart size and mediastinal contours are within normal limits.
Both lungs are clear. The visualized skeletal structures are
unremarkable.
IMPRESSION: No active cardiopulmonary disease.

## 2021-10-14 MED ORDER — BENZONATATE 200 MG PO CAPS
200.0000 mg | ORAL_CAPSULE | Freq: Three times a day (TID) | ORAL | 0 refills | Status: DC | PRN
Start: 2021-10-14 — End: 2021-12-23

## 2021-10-14 NOTE — Patient Instructions (Signed)
You may take Benzonatate capsules for cough. Take 1 capsule by mouth three times daily as needed for cough.  Go get your xray as discussed.   I'll be in touch soon.  Allie Bossier, NP-C

## 2021-10-14 NOTE — Assessment & Plan Note (Signed)
Symptoms likely post viral from Covid-19 infection. Fortunately she is improving.   Will check chest xray given her deep and persistent cough today.   Rx for Ladona Ridgel sent to pharmacy.   Await results.

## 2021-10-14 NOTE — Progress Notes (Signed)
Patient ID: Valerie Gordon, female    DOB: 1951-05-21, 70 y.o.   MRN: [id number]  Virtual visit completed through [employer], a video enabled telemedicine application. Due to national recommendations of social distancing due to COVID-19, a virtual visit is felt to be most appropriate for this patient at this time. Reviewed limitations, risks, security and privacy concerns of performing a virtual visit and the availability of in person appointments. I also reviewed that there may be a patient responsible charge related to this service. The patient agreed to proceed.   Patient location: home Provider location: [place] at [facility], office Persons participating in this virtual visit: patient, provider   If any vitals were documented, they were collected by patient at home unless specified below.    Ht 5\' 1"  (1.549 m)   Wt 210 lb (95.3 kg)   BMI 39.68 kg/m    CC: Covid-19 symptoms Subjective:   HPI: Valerie Gordon is a 70 y.o. female with a history of type 2 diabetes, obesity, hyperlipidemia presenting on 10/14/2021 for to discuss cough.  Tested positive for Covid-19 on 09/26/21, symptom onset of 09/24/21 with mild symptoms. She was in [place] at the time. For the following 10 days symptoms progressed and included cough, headache, nasal congestion, fevers, post nasal drip, sore throat, sinus pressure, body aches.   She began to feel better about 3-4 days ago, but continued to test positive up until two days ago. She has a mild sore throat, mild irritation to her sinuses, and a nagging/dry cough when talking mostly. She's had no fever for the last two days. She denies shortness of breath or chest heaviness.   She's been taking Tylenol, saline sprays, saline eye drops, gargled with salt water. She's had three Covid-19 vaccines. She's not taken an antihistamine as they make her feel worse.        Relevant past medical, surgical, family and social history reviewed and updated as  indicated. Interim medical history since our last visit reviewed. Allergies and medications reviewed and updated. Outpatient Medications Prior to Visit  Medication Sig Dispense Refill   b complex vitamins tablet Take 1 tablet by mouth daily.     BIOTIN PO Take 5 mg by mouth.     Cholecalciferol 50 MCG (2000 UT) CAPS Take by mouth.     Insulin Glargine (BASAGLAR KWIKPEN) 100 UNIT/ML Inject 32 Units into the skin daily. For diabetes. 30 mL 3   Insulin Pen Needle (B-D ULTRAFINE III SHORT PEN) 31G X 8 MM MISC USE NIGHTLY WITH INSULIN 100 each 1   Krill Oil 300 MG CAPS Take by mouth.     latanoprost (XALATAN) 0.005 % ophthalmic solution 1 drop at bedtime.     levothyroxine (SYNTHROID) 100 MCG tablet Take 1 tablet by mouth every morning on an empty stomach with water only.  No food or other medications for 30 minutes. 90 tablet 0   Magnesium 200 MG TABS Take by mouth.     Menaquinone-7 (VITAMIN K2 PO) Take 19 mcg by mouth.     metFORMIN (GLUCOPHAGE XR) 500 MG 24 hr tablet Take 2 tablets (1,000 mg total) by mouth 2 (two) times daily with a meal. For diabetes. 360 tablet 1   rosuvastatin (CRESTOR) 10 MG tablet TAKE 1 TABLET BY MOUTH DAILY. FOR CHOLESTEROL. 90 tablet 3   VITAMIN A PO Take 2,400 mcg by mouth.     No facility-administered medications prior to visit.     Per HPI unless  specifically indicated in ROS section below Review of Systems  Constitutional:  Negative for chills and fever.  HENT:  Positive for postnasal drip and sore throat. Negative for sinus pressure.   Respiratory:  Positive for cough.   Objective:  Ht 5\' 1"  (1.549 m)   Wt 210 lb (95.3 kg)   BMI 39.68 kg/m   Wt Readings from Last 3 Encounters:  10/14/21 210 lb (95.3 kg)  08/27/21 210 lb (95.3 kg)  05/14/21 214 lb (97.1 kg)       Physical exam: Gen: alert, NAD, not ill appearing Pulm: speaks in complete sentences without increased work of breathing. Dry, deep cough noted during visit.  Psych: normal mood, normal  thought content      Results for orders placed or performed in visit on 08/27/21  POCT glycosylated hemoglobin (Hb A1C)  Result Value Ref Range   Hemoglobin A1C 7.6 (A) 4.0 - 5.6 %   HbA1c POC (<> result, manual entry)     HbA1c, POC (prediabetic range)     HbA1c, POC (controlled diabetic range)     Assessment & Plan:   Problem List Items Addressed This Visit       Other   COVID-19 virus infection - Primary    Symptoms likely post viral from Covid-19 infection. Fortunately she is improving.   Will check chest xray given her deep and persistent cough today.   Rx for Ladona Ridgel sent to pharmacy.   Await results.      Relevant Medications   benzonatate (TESSALON) 200 MG capsule   Other Relevant Orders   DG Chest 2 View     Meds ordered this encounter  Medications   benzonatate (TESSALON) 200 MG capsule    Sig: Take 1 capsule (200 mg total) by mouth 3 (three) times daily as needed for cough.    Dispense:  15 capsule    Refill:  0    Order Specific Question:   Supervising Provider    Answer:   Diona Browner, AMY E [[id number]]    Orders Placed This Encounter  Procedures   DG Chest 57 View    70 year old female diagnosed with Covid-19 09/26/21, tested negative 2 days ago. Ongoing, deep cough.    Standing Status:   Future    Standing Expiration Date:   10/14/2022    Order Specific Question:   Reason for Exam (SYMPTOM  OR DIAGNOSIS REQUIRED)    Answer:   cough, post Covid-19    Order Specific Question:   Preferred imaging location?    Answer:   [facility]    Order Specific Question:   Radiology Contrast Protocol - do NOT remove file path    Answer:   [email or web address]     I discussed the assessment and treatment plan with the patient. The patient was provided an opportunity to ask questions and all were answered. The patient agreed with the plan and demonstrated an understanding of the instructions. The patient was  advised to call back or seek an in-person evaluation if the symptoms worsen or if the condition fails to improve as anticipated.  Follow up plan:  You may take Benzonatate capsules for cough. Take 1 capsule by mouth three times daily as needed for cough.  Go get your xray as discussed.   I'll be in touch soon.  Valerie Bossier, NP-C     Pleas Koch, NP

## 2021-10-29 DIAGNOSIS — L578 Other skin changes due to chronic exposure to nonionizing radiation: Secondary | ICD-10-CM | POA: Diagnosis not present

## 2021-10-29 DIAGNOSIS — Z85828 Personal history of other malignant neoplasm of skin: Secondary | ICD-10-CM | POA: Diagnosis not present

## 2021-10-29 DIAGNOSIS — Z872 Personal history of diseases of the skin and subcutaneous tissue: Secondary | ICD-10-CM | POA: Diagnosis not present

## 2021-11-12 DIAGNOSIS — H401133 Primary open-angle glaucoma, bilateral, severe stage: Secondary | ICD-10-CM | POA: Diagnosis not present

## 2021-11-16 ENCOUNTER — Other Ambulatory Visit: Payer: Self-pay | Admitting: Primary Care

## 2021-11-16 DIAGNOSIS — E785 Hyperlipidemia, unspecified: Secondary | ICD-10-CM

## 2021-11-17 DIAGNOSIS — H401133 Primary open-angle glaucoma, bilateral, severe stage: Secondary | ICD-10-CM | POA: Diagnosis not present

## 2021-11-17 LAB — HM DIABETES EYE EXAM

## 2021-11-28 NOTE — Progress Notes (Signed)
Subjective:    Valerie Gordon is a 70 y.o. female who presents for Medicare Annual (Subsequent) preventive examination.  I connected with Valerie Gordon today by telephone and verified that I am speaking with the correct person using two identifiers. Location patient: home Location provider: work Persons participating in the virtual visit: patient, Marine scientist.    I discussed the limitations, risks, security and privacy concerns of performing an evaluation and management service by telephone and the availability of in person appointments. I also discussed with the patient that there may be a patient responsible charge related to this service. The patient expressed understanding and verbally consented to this telephonic visit.    Interactive audio and video telecommunications were attempted between this provider and patient, however failed, due to patient having technical difficulties OR patient did not have access to video capability.  We continued and completed visit with audio only.  Some vital signs may be absent or patient reported.   Time Spent with patient on telephone encounter: 25 minutes  Review of Systems     Cardiac Risk Factors include: advanced age (>67men, >65 women);diabetes mellitus;dyslipidemia     Objective:    Today's Vitals   12/01/21 0944  Weight: 210 lb (95.3 kg)  Height: 5\' 1"  (1.549 m)   Body mass index is 39.68 kg/m.  Advanced Directives 12/01/2021 08/23/2020  Does Patient Have a Medical Advance Directive? Yes Yes  Type of Paramedic of [place];Living will [place];Living will  Does patient want to make changes to medical advance directive? Yes (MAU/Ambulatory/Procedural Areas - Information given) -  Copy of [place] in Chart? - No - copy requested    Current Medications (verified) Outpatient Encounter Medications as of 12/01/2021  Medication Sig   b complex vitamins tablet Take 1 tablet by  mouth daily.   BIOTIN PO Take 5 mg by mouth.   Cholecalciferol 50 MCG (2000 UT) CAPS Take by mouth.   Insulin Glargine (BASAGLAR KWIKPEN) 100 UNIT/ML Inject 32 Units into the skin daily. For diabetes.   Insulin Pen Needle (B-D ULTRAFINE III SHORT PEN) 31G X 8 MM MISC USE NIGHTLY WITH INSULIN   Krill Oil 300 MG CAPS Take by mouth.   latanoprost (XALATAN) 0.005 % ophthalmic solution 1 drop at bedtime.   levothyroxine (SYNTHROID) 100 MCG tablet Take 1 tablet by mouth every morning on an empty stomach with water only.  No food or other medications for 30 minutes.   Magnesium 200 MG TABS Take by mouth.   Menaquinone-7 (VITAMIN K2 PO) Take 19 mcg by mouth.   metFORMIN (GLUCOPHAGE XR) 500 MG 24 hr tablet Take 2 tablets (1,000 mg total) by mouth 2 (two) times daily with a meal. For diabetes.   rosuvastatin (CRESTOR) 10 MG tablet TAKE 1 TABLET BY MOUTH ONCE DAILY FOR CHOLESTEROL   VITAMIN A PO Take 2,400 mcg by mouth.   benzonatate (TESSALON) 200 MG capsule Take 1 capsule (200 mg total) by mouth 3 (three) times daily as needed for cough.   No facility-administered encounter medications on file as of 12/01/2021.    Allergies (verified) Antihistamines, chlorpheniramine-type; Ciprocin-fluocin-procin [fluocinolone]; and Trulicity [dulaglutide]   History: Past Medical History:  Diagnosis Date   Cataract    Depression    GERD (gastroesophageal reflux disease)    Glaucoma    Thyroid disease    Type 2 diabetes mellitus ([place])    manages with diet and exercise   Past Surgical History:  Procedure Laterality  Date   BREAST BIOPSY  2010   CARDIAC CATHETERIZATION  1998   COLONOSCOPY  10-30-2004   COLONOSCOPY WITH PROPOFOL N/A 04/01/2016   Procedure: COLONOSCOPY WITH PROPOFOL;  Surgeon: Robert Bellow, MD;  Location: [facility] ENDOSCOPY;  Service: Endoscopy;  Laterality: N/A;   EYE SURGERY Bilateral    cataract sx   TONSILLECTOMY     Family History  Problem Relation Age of Onset   Stroke Mother     Heart disease Father    Social History   Socioeconomic History   Marital status: Widowed    Spouse name: Not on file   Number of children: Not on file   Years of education: Not on file   Highest education level: Not on file  Occupational History   Not on file  Tobacco Use   Smoking status: Never   Smokeless tobacco: Never  Vaping Use   Vaping Use: Never used  Substance and Sexual Activity   Alcohol use: Yes    Comment: occasional wine   Drug use: No   Sexual activity: Yes  Other Topics Concern   Not on file  Social History Narrative   Widower.   Step children.   Environmental health practitioner.   Enjoys riding hot air balloons, spending time with friends, reading, jewelry   Social Determinants of Health   Financial Resource Strain: Low Risk    Difficulty of Paying Living Expenses: Not hard at all  Food Insecurity: No Food Insecurity   Worried About Charity fundraiser in the Last Year: Never true   Arboriculturist in the Last Year: Never true  Transportation Needs: No Transportation Needs   Lack of Transportation (Medical): No   Lack of Transportation (Non-Medical): No  Physical Activity: Sufficiently Active   Days of Exercise per Week: 4 days   Minutes of Exercise per Session: 40 min  Stress: No Stress Concern Present   Feeling of Stress : Not at all  Social Connections: Moderately Isolated   Frequency of Communication with Friends and Family: More than three times a week   Frequency of Social Gatherings with Friends and Family: Three times a week   Attends Religious Services: Never   Active Member of Clubs or Organizations: Yes   Attends Archivist Meetings: More than 4 times per year   Marital Status: Widowed    Tobacco Counseling Counseling given: Not Answered   Clinical Intake:  Pre-visit preparation completed: Yes  Pain : No/denies pain     BMI - recorded: 39.7 Nutritional Status: BMI > 30  Obese Nutritional Risks: None Diabetes:  Yes CBG done?: No (visit completed over the phone) Did pt. bring in CBG monitor from home?: No  How often do you need to have someone help you when you read instructions, pamphlets, or other written materials from your doctor or pharmacy?: 1 - Never  Diabetes:  Is the patient diabetic?  Yes  If diabetic, was a CBG obtained today?  No , visit completed over the phone. Did the patient bring in their glucometer from home?  No , visit completed over the phone. How often do you monitor your CBG's? Not currently monitoring. Patient in the process of getting a new glucometer.   Financial Strains and Diabetes Management:  Are you having any financial strains with the device, your supplies or your medication? No .  Does the patient want to be seen by Chronic Care Management for management of their diabetes?  No  Would the patient like to be referred to a Nutritionist or for Diabetic Management?  No   Diabetic Exams:  Diabetic Eye Exam: Completed 10/2021.   Diabetic Foot Exam: Completed 08/27/21.   Interpreter Needed?: No  Information entered by :: Orrin Brigham LPN   Activities of Daily Living In your present state of health, do you have any difficulty performing the following activities: 12/01/2021 08/27/2021  Hearing? N N  Vision? N N  Difficulty concentrating or making decisions? N N  Walking or climbing stairs? N N  Dressing or bathing? N N  Doing errands, shopping? N N  Preparing Food and eating ? N -  Using the Toilet? N -  In the past six months, have you accidently leaked urine? N -  Do you have problems with loss of bowel control? N -  Managing your Medications? N -  Managing your Finances? N -  Housekeeping or managing your Housekeeping? N -  Some recent data might be hidden    Patient Care Team: Pleas Koch, NP as PCP - General (Internal Medicine) Ammie Dalton, Okey Regal, MD as Consulting Physician (Obstetrics and Gynecology) Bary Castilla Forest Gleason, MD (General  Surgery) Kennith Center, RD as Dietitian (Family Medicine)  Indicate any recent Medical Services you may have received from other than [facility] providers in the past year (date may be approximate).     Assessment:   This is a routine wellness examination for Valerie Gordon.  Hearing/Vision screen Hearing Screening - Comments:: No issues Vision Screening - Comments:: Last exam 10/2021, Dr. Wallace Going , wears glasses  Dietary issues and exercise activities discussed: Current Exercise Habits: Home exercise routine, Type of exercise: yoga;stretching;walking, Time (Minutes): 45, Frequency (Times/Week): 5, Weekly Exercise (Minutes/Week): 225, Intensity: Moderate   Goals Addressed             This Visit's Progress    Patient Stated       Would like to lose weight and increase energy.       Depression Screen PHQ 2/9 Scores 12/01/2021 08/27/2021 08/23/2020 01/16/2015  PHQ - 2 Score 0 0 0 3  PHQ- 9 Score - 0 0 13    Fall Risk Fall Risk  12/01/2021 08/27/2021 08/27/2021 08/23/2020 08/10/2018  Falls in the past year? 1 1 0 0 No  Number falls in past yr: 0 0 0 0 -  Injury with Fall? 0 1 0 0 -  Risk for fall due to : Other (Comment) Impaired balance/gait - Medication side effect -  Risk for fall due to: Comment tangled in water hose - - - -  Follow up Falls prevention discussed - - Falls evaluation completed;Falls prevention discussed -    FALL RISK PREVENTION PERTAINING TO THE HOME:  Any stairs in or around the home? Yes  If so, are there any without handrails? No  Home free of loose throw rugs in walkways, pet beds, electrical cords, etc? Yes  Adequate lighting in your home to reduce risk of falls? Yes   ASSISTIVE DEVICES UTILIZED TO PREVENT FALLS:  Life alert? No  Use of a cane, walker or w/c? No  Grab bars in the bathroom? Yes  Shower chair or bench in shower? Yes  Elevated toilet seat or a handicapped toilet? No   TIMED UP AND GO:  Was the test performed? No , visit completed over the  phone.    Cognitive Function: Normal cognitive status assessed by   this Nurse Health Advisor. No abnormalities found.   MMSE -  Mini Mental State Exam 08/23/2020  Orientation to time 5  Orientation to Place 5  Registration 3  Attention/ Calculation 5  Recall 3  Language- repeat 1        Immunizations Immunization History  Administered Date(s) Administered   Influenza,inj,Quad PF,6+ Mos 01/16/2015, 11/23/2017, 01/18/2019, 10/30/2019   PFIZER(Purple Top)SARS-COV-2 Vaccination 02/26/2020, 03/18/2020, 04/27/2020   PNEUMOCOCCAL CONJUGATE-20 05/30/2021   Pneumococcal Conjugate-13 08/23/2020   Pneumococcal Polysaccharide-23 03/30/2016   Td 03/30/2016   Tdap 02/24/2006   Zoster Recombinat (Shingrix) 05/30/2021, 08/23/2021   Zoster, Live 01/16/2015    TDAP status: Up to date  Flu Vaccine status: Due, Education has been provided regarding the importance of this vaccine. Advised may receive this vaccine at local pharmacy or Health Dept. Aware to provide a copy of the vaccination record if obtained from local pharmacy or Health Dept. Verbalized acceptance and understanding.  Pneumococcal vaccine status: Up to date  Covid-19 vaccine status: Information provided on how to obtain vaccines.   Qualifies for Shingles Vaccine? Yes   Zostavax completed Yes   Shingrix Completed?: Yes  Screening Tests Health Maintenance  Topic Date Due   DEXA SCAN  Never done   COVID-19 Vaccine (4 - Booster for Pfizer series) 06/22/2020   OPHTHALMOLOGY EXAM  11/08/2021   INFLUENZA VACCINE  03/27/2022 (Originally 07/28/2021)   MAMMOGRAM  02/11/2022   HEMOGLOBIN A1C  02/24/2022   URINE MICROALBUMIN  02/26/2022   FOOT EXAM  08/27/2022   TETANUS/TDAP  03/30/2026   COLONOSCOPY (Pts 45-92yrs Insurance coverage will need to be confirmed)  04/01/2026   Pneumonia Vaccine 76+ Years old  Completed   Hepatitis C Screening  Completed   Zoster Vaccines- Shingrix  Completed   HPV VACCINES  Aged Out    Health  Maintenance  Health Maintenance Due  Topic Date Due   DEXA SCAN  Never done   COVID-19 Vaccine (4 - Booster for Alpha series) 06/22/2020   OPHTHALMOLOGY EXAM  11/08/2021    Colorectal cancer screening: Type of screening: Colonoscopy. Completed 04/01/16. Repeat every 10 years  Mammogram status: Ordered 12/01/21. Pt provided with contact info and advised to call to schedule appt.   Bone Density status: Ordered 02/26/21. Pt provided with contact info and advised to call to schedule appt.  Lung Cancer Screening: (Low Dose CT Chest recommended if Age 49-80 years, 30 pack-year currently smoking OR have quit w/in 15years.) does not qualify.     Additional Screening:  Hepatitis C Screening: does qualify; Completed 11/23/17  Vision Screening: Recommended annual ophthalmology exams for early detection of glaucoma and other disorders of the eye. Is the patient up to date with their annual eye exam?  Yes  Who is the provider or what is the name of the office in which the patient attends annual eye exams? Dr. Wallace Going    Dental Screening: Recommended annual dental exams for proper oral hygiene  Community Resource Referral / Chronic Care Management: CRR required this visit?  No   CCM required this visit?  No      Plan:     I have personally reviewed and noted the following in the patient's chart:   Medical and social history Use of alcohol, tobacco or illicit drugs  Current medications and supplements including opioid prescriptions.  Functional ability and status Nutritional status Physical activity Advanced directives List of other physicians Hospitalizations, surgeries, and ER visits in previous 12 months Vitals Screenings to include cognitive, depression, and falls Referrals and appointments  In addition, I have reviewed  and discussed with patient certain preventive protocols, quality metrics, and best practice recommendations. A written personalized care plan for  preventive services as well as general preventive health recommendations were provided to patient.   Due to this being a telephonic visit, the after visit summary with patients personalized plan was offered to patient via mail or my-chart. Patient would like to access on my-chart.   Loma Messing, LPN   05/31/159   Nurse Health Advisor  Nurse Notes: none

## 2021-12-01 ENCOUNTER — Ambulatory Visit (INDEPENDENT_AMBULATORY_CARE_PROVIDER_SITE_OTHER): Payer: Medicare HMO

## 2021-12-01 VITALS — Ht 61.0 in | Wt 210.0 lb

## 2021-12-01 DIAGNOSIS — Z0000 Encounter for general adult medical examination without abnormal findings: Secondary | ICD-10-CM

## 2021-12-01 DIAGNOSIS — Z1231 Encounter for screening mammogram for malignant neoplasm of breast: Secondary | ICD-10-CM | POA: Diagnosis not present

## 2021-12-01 NOTE — Patient Instructions (Signed)
Valerie Gordon , Thank you for taking time to complete your Medicare Wellness Visit. I appreciate your ongoing commitment to your health goals. Please review the following plan we discussed and let me know if I can assist you in the future.   Screening recommendations/referrals: Colonoscopy: up to date, completed 04/01/16, due 04/01/26 Mammogram: due, last completed 02/12/20, ordered today someone will call to schedule your appointment Bone Density: due, last completed 04/01/16, ordered, someone will call to schedule Recommended yearly ophthalmology/optometry visit for glaucoma screening and checkup Recommended yearly dental visit for hygiene and checkup  Vaccinations: Influenza vaccine: Due-May obtain vaccine at our office or your local pharmacy. Pneumococcal vaccine: up to date Tdap vaccine: up to date, completed 03/30/16, due 03/30/26 Shingles vaccine: up to date   Covid-19:newest booster available at your local pharmacy  Advanced directives: Please bring a copy of Living Will and/or [place] for your chart.   Conditions/risks identified: see problem list  Next appointment: Follow up in one year for your annual wellness visit 12/02/22 @ 9:00am, this will be a telephone visit.    Preventive Care 68 Years and Older, Female Preventive care refers to lifestyle choices and visits with your health care provider that can promote health and wellness. What does preventive care include? A yearly physical exam. This is also called an annual well check. Dental exams once or twice a year. Routine eye exams. Ask your health care provider how often you should have your eyes checked. Personal lifestyle choices, including: Daily care of your teeth and gums. Regular physical activity. Eating a healthy diet. Avoiding tobacco and drug use. Limiting alcohol use. Practicing safe sex. Taking low-dose aspirin every day. Taking vitamin and mineral supplements as recommended by your health care  provider. What happens during an annual well check? The services and screenings done by your health care provider during your annual well check will depend on your age, overall health, lifestyle risk factors, and family history of disease. Counseling  Your health care provider may ask you questions about your: Alcohol use. Tobacco use. Drug use. Emotional well-being. Home and relationship well-being. Sexual activity. Eating habits. History of falls. Memory and ability to understand (cognition). Work and work Statistician. Reproductive health. Screening  You may have the following tests or measurements: Height, weight, and BMI. Blood pressure. Lipid and cholesterol levels. These may be checked every 5 years, or more frequently if you are over 1 years old. Skin check. Lung cancer screening. You may have this screening every year starting at age 32 if you have a 30-pack-year history of smoking and currently smoke or have quit within the past 15 years. Fecal occult blood test (FOBT) of the stool. You may have this test every year starting at age 79. Flexible sigmoidoscopy or colonoscopy. You may have a sigmoidoscopy every 5 years or a colonoscopy every 10 years starting at age 23. Hepatitis C blood test. Hepatitis B blood test. Sexually transmitted disease (STD) testing. Diabetes screening. This is done by checking your blood sugar (glucose) after you have not eaten for a while (fasting). You may have this done every 1-3 years. Bone density scan. This is done to screen for osteoporosis. You may have this done starting at age 61. Mammogram. This may be done every 1-2 years. Talk to your health care provider about how often you should have regular mammograms. Talk with your health care provider about your test results, treatment options, and if necessary, the need for more tests. Vaccines  Your  health care provider may recommend certain vaccines, such as: Influenza vaccine. This is  recommended every year. Tetanus, diphtheria, and acellular pertussis (Tdap, Td) vaccine. You may need a Td booster every 10 years. Zoster vaccine. You may need this after age 56. Pneumococcal 13-valent conjugate (PCV13) vaccine. One dose is recommended after age 93. Pneumococcal polysaccharide (PPSV23) vaccine. One dose is recommended after age 58. Talk to your health care provider about which screenings and vaccines you need and how often you need them. This information is not intended to replace advice given to you by your health care provider. Make sure you discuss any questions you have with your health care provider. Document Released: 01/10/2016 Document Revised: 09/02/2016 Document Reviewed: 10/15/2015 Elsevier Interactive Patient Education  2017 [place] Prevention in the Home Falls can cause injuries. They can happen to people of all ages. There are many things you can do to make your home safe and to help prevent falls. What can I do on the outside of my home? Regularly fix the edges of walkways and driveways and fix any cracks. Remove anything that might make you trip as you walk through a door, such as a raised step or threshold. Trim any bushes or trees on the path to your home. Use bright outdoor lighting. Clear any walking paths of anything that might make someone trip, such as rocks or tools. Regularly check to see if handrails are loose or broken. Make sure that both sides of any steps have handrails. Any raised decks and porches should have guardrails on the edges. Have any leaves, snow, or ice cleared regularly. Use sand or salt on walking paths during winter. Clean up any spills in your garage right away. This includes oil or grease spills. What can I do in the bathroom? Use night lights. Install grab bars by the toilet and in the tub and shower. Do not use towel bars as grab bars. Use non-skid mats or decals in the tub or shower. If you need to sit down in  the shower, use a plastic, non-slip stool. Keep the floor dry. Clean up any water that spills on the floor as soon as it happens. Remove soap buildup in the tub or shower regularly. Attach bath mats securely with double-sided non-slip rug tape. Do not have throw rugs and other things on the floor that can make you trip. What can I do in the bedroom? Use night lights. Make sure that you have a light by your bed that is easy to reach. Do not use any sheets or blankets that are too big for your bed. They should not hang down onto the floor. Have a firm chair that has side arms. You can use this for support while you get dressed. Do not have throw rugs and other things on the floor that can make you trip. What can I do in the kitchen? Clean up any spills right away. Avoid walking on wet floors. Keep items that you use a lot in easy-to-reach places. If you need to reach something above you, use a strong step stool that has a grab bar. Keep electrical cords out of the way. Do not use floor polish or wax that makes floors slippery. If you must use wax, use non-skid floor wax. Do not have throw rugs and other things on the floor that can make you trip. What can I do with my stairs? Do not leave any items on the stairs. Make sure that there are handrails  on both sides of the stairs and use them. Fix handrails that are broken or loose. Make sure that handrails are as long as the stairways. Check any carpeting to make sure that it is firmly attached to the stairs. Fix any carpet that is loose or worn. Avoid having throw rugs at the top or bottom of the stairs. If you do have throw rugs, attach them to the floor with carpet tape. Make sure that you have a light switch at the top of the stairs and the bottom of the stairs. If you do not have them, ask someone to add them for you. What else can I do to help prevent falls? Wear shoes that: Do not have high heels. Have rubber bottoms. Are comfortable  and fit you well. Are closed at the toe. Do not wear sandals. If you use a stepladder: Make sure that it is fully opened. Do not climb a closed stepladder. Make sure that both sides of the stepladder are locked into place. Ask someone to hold it for you, if possible. Clearly mark and make sure that you can see: Any grab bars or handrails. First and last steps. Where the edge of each step is. Use tools that help you move around (mobility aids) if they are needed. These include: Canes. Walkers. Scooters. Crutches. Turn on the lights when you go into a dark area. Replace any light bulbs as soon as they burn out. Set up your furniture so you have a clear path. Avoid moving your furniture around. If any of your floors are uneven, fix them. If there are any pets around you, be aware of where they are. Review your medicines with your doctor. Some medicines can make you feel dizzy. This can increase your chance of falling. Ask your doctor what other things that you can do to help prevent falls. This information is not intended to replace advice given to you by your health care provider. Make sure you discuss any questions you have with your health care provider. Document Released: 10/10/2009 Document Revised: 05/21/2016 Document Reviewed: 01/18/2015 Elsevier Interactive Patient Education  2017 [employer].

## 2021-12-03 ENCOUNTER — Encounter: Payer: Medicare HMO | Admitting: Primary Care

## 2021-12-12 ENCOUNTER — Encounter: Payer: Self-pay | Admitting: Primary Care

## 2021-12-19 ENCOUNTER — Ambulatory Visit: Payer: Medicare HMO | Admitting: Primary Care

## 2021-12-23 ENCOUNTER — Telehealth: Payer: Self-pay

## 2021-12-23 DIAGNOSIS — E785 Hyperlipidemia, unspecified: Secondary | ICD-10-CM

## 2021-12-23 DIAGNOSIS — E119 Type 2 diabetes mellitus without complications: Secondary | ICD-10-CM

## 2021-12-23 DIAGNOSIS — E039 Hypothyroidism, unspecified: Secondary | ICD-10-CM

## 2021-12-23 MED ORDER — LEVOTHYROXINE SODIUM 100 MCG PO TABS: ORAL_TABLET | ORAL | 0 refills | Status: DC

## 2021-12-23 NOTE — Addendum Note (Signed)
Addended by: Pleas Koch on: 12/23/2021 04:16 PM   Modules accepted: Orders

## 2021-12-23 NOTE — Telephone Encounter (Signed)
Can we get her on a virtual visit for follow up with a lab appointment 2-3 days prior?   She needs follow up for hypothyroidism and diabetes. She has an appointment scheduled for 02/06/22 with anticipation that we would be back at [facility] by then, since this is not the case I'd like to see her in January virtually.   Will send refill for levothyroxine.

## 2021-12-23 NOTE — Telephone Encounter (Signed)
Fax received to change manufacture of Levothyroxine euthyrox not available.

## 2021-12-24 NOTE — Telephone Encounter (Signed)
Noted  

## 2021-12-24 NOTE — Telephone Encounter (Signed)
Called patient cancelled office visit for 2/23. Have made lab at [place] on 1/16 no morning appointments before that day and follow up with [place] later that week virtual. She is aware of change in meds and will pick up today.

## 2021-12-25 ENCOUNTER — Telehealth: Payer: Self-pay | Admitting: *Deleted

## 2021-12-25 NOTE — Telephone Encounter (Signed)
Received fax from [facility] on [address] asking if okay to change manufacturer for patient's levothyroxine 100 mcg.  Please advise.

## 2021-12-25 NOTE — Telephone Encounter (Signed)
Okay to change manufacturer.  I sent a 90 day supply of her levothyroxine (Synthroid) 100 mcg on 12/23/21.

## 2021-12-30 NOTE — Telephone Encounter (Signed)
Called pharmacy approval received on 12/29 no further action needed.

## 2022-01-07 DIAGNOSIS — R051 Acute cough: Secondary | ICD-10-CM

## 2022-01-07 MED ORDER — BENZONATATE 200 MG PO CAPS: 200.0000 mg | ORAL_CAPSULE | Freq: Three times a day (TID) | ORAL | 0 refills | Status: DC | PRN

## 2022-01-12 ENCOUNTER — Other Ambulatory Visit: Payer: Self-pay

## 2022-01-12 ENCOUNTER — Telehealth: Payer: Self-pay

## 2022-01-12 ENCOUNTER — Other Ambulatory Visit (INDEPENDENT_AMBULATORY_CARE_PROVIDER_SITE_OTHER): Payer: Medicare HMO

## 2022-01-12 DIAGNOSIS — E039 Hypothyroidism, unspecified: Secondary | ICD-10-CM

## 2022-01-12 DIAGNOSIS — E119 Type 2 diabetes mellitus without complications: Secondary | ICD-10-CM | POA: Diagnosis not present

## 2022-01-12 DIAGNOSIS — E785 Hyperlipidemia, unspecified: Secondary | ICD-10-CM | POA: Diagnosis not present

## 2022-01-12 LAB — HEMOGLOBIN A1C: Hgb A1c MFr Bld: 8.5 % — ABNORMAL HIGH (ref 4.6–6.5)

## 2022-01-12 LAB — LIPID PANEL
Cholesterol: 123 mg/dL (ref 0–200)
HDL: 49.8 mg/dL (ref >39.00)
LDL Cholesterol: 44 mg/dL (ref 0–99)
NonHDL: 73.36
Total CHOL/HDL Ratio: 2
Triglycerides: 148 mg/dL (ref 0.0–149.0)
VLDL: 29.6 mg/dL (ref 0.0–40.0)

## 2022-01-12 LAB — COMPREHENSIVE METABOLIC PANEL
ALT: 26 U/L (ref 0–35)
AST: 61 U/L — ABNORMAL HIGH (ref 0–37)
Albumin: 4.5 g/dL (ref 3.5–5.2)
Alkaline Phosphatase: 70 U/L (ref 39–117)
BUN: 10 mg/dL (ref 6–23)
CO2: 26 mEq/L (ref 19–32)
Calcium: 9.9 mg/dL (ref 8.4–10.5)
Chloride: 97 mEq/L (ref 96–112)
Creatinine, Ser: 0.84 mg/dL (ref 0.40–1.20)
GFR: 71.36 mL/min (ref >60.00)
Glucose, Bld: 172 mg/dL — ABNORMAL HIGH (ref 70–99)
Potassium: 4.9 mEq/L (ref 3.5–5.1)
Sodium: 131 mEq/L — ABNORMAL LOW (ref 135–145)
Total Bilirubin: 0.5 mg/dL (ref 0.2–1.2)
Total Protein: 7.3 g/dL (ref 6.0–8.3)

## 2022-01-12 LAB — TSH: TSH: 1.4 u[IU]/mL (ref 0.35–5.50)

## 2022-01-12 NOTE — Telephone Encounter (Signed)
Agree, will see patient as scheduled.

## 2022-01-12 NOTE — Telephone Encounter (Signed)
Pt asked lab to have BP checked while she was at [facility] because she has a VV on 01/16/22 with Gentry Fitz NP and pt does not have access to BP cuff at home. Dustin CMA took BP  174/88.  Pt said getting blood drawn bothers her and pt has been taking Alka Seltzer cold and sinus med last dose was last night due to prod cough with light yellow phlegm, runny nose and head congestion that started last wk; pt had negative home covid test on 01/11/22. Pt said she does not have SOB, CP, H/A, dizziness or vision changes and no other covid symptoms.  Recked BP and vitals. T 98.9 oral, P 74 BP 140/80 lge cuff sitting lt arm and pulse ox 97%. Pt said her cold symptoms are better than last wk and pt thinks she is doing OK just wanted to get BP ck for next VV. Pt said she does not think she needs to be seen. I spoke with Eugenia Pancoast FNP and reviewed above info. Pt is not on any BP med. Tabitiha said if pt could would get BP cough for home since  more recent systolic readings in 350'K. Pt said she would pick up BP cuff for home.UC & FU instructions given and pt voiced understanding. Pt appreciative and left office without any distress or issues. Sending note to Eugenia Pancoast FNP and Gentry Fitz NP as PCP who is out of office today and Joellen CMA.

## 2022-01-16 ENCOUNTER — Ambulatory Visit (INDEPENDENT_AMBULATORY_CARE_PROVIDER_SITE_OTHER): Payer: Medicare HMO | Admitting: Primary Care

## 2022-01-16 ENCOUNTER — Other Ambulatory Visit: Payer: Self-pay

## 2022-01-16 ENCOUNTER — Encounter: Payer: Self-pay | Admitting: Primary Care

## 2022-01-16 VITALS — BP 146/78 | HR 72 | Temp 97.6°F | Ht 61.0 in | Wt 211.2 lb

## 2022-01-16 DIAGNOSIS — E2839 Other primary ovarian failure: Secondary | ICD-10-CM | POA: Diagnosis not present

## 2022-01-16 DIAGNOSIS — E1165 Type 2 diabetes mellitus with hyperglycemia: Secondary | ICD-10-CM

## 2022-01-16 DIAGNOSIS — R69 Illness, unspecified: Secondary | ICD-10-CM | POA: Diagnosis not present

## 2022-01-16 DIAGNOSIS — J019 Acute sinusitis, unspecified: Secondary | ICD-10-CM | POA: Insufficient documentation

## 2022-01-16 DIAGNOSIS — E785 Hyperlipidemia, unspecified: Secondary | ICD-10-CM | POA: Diagnosis not present

## 2022-01-16 DIAGNOSIS — E039 Hypothyroidism, unspecified: Secondary | ICD-10-CM

## 2022-01-16 DIAGNOSIS — Z0001 Encounter for general adult medical examination with abnormal findings: Secondary | ICD-10-CM | POA: Diagnosis not present

## 2022-01-16 DIAGNOSIS — F330 Major depressive disorder, recurrent, mild: Secondary | ICD-10-CM | POA: Diagnosis not present

## 2022-01-16 MED ORDER — AMOXICILLIN-POT CLAVULANATE 875-125 MG PO TABS: 1.0000 | ORAL_TABLET | Freq: Two times a day (BID) | ORAL | 0 refills | Status: DC

## 2022-01-16 MED ORDER — GLIPIZIDE 5 MG PO TABS
5.0000 mg | ORAL_TABLET | Freq: Two times a day (BID) | ORAL | 3 refills | Status: DC
Start: 2022-01-16 — End: 2022-12-18

## 2022-01-16 NOTE — Addendum Note (Signed)
Addended by: Pleas Koch on: 01/16/2022 11:15 AM   Modules accepted: Orders

## 2022-01-16 NOTE — Assessment & Plan Note (Signed)
Lipid panel reviewed, LDL at goal. Continue rosuvastatin 10 mg daily.

## 2022-01-16 NOTE — Progress Notes (Signed)
Subjective:    Patient ID: Valerie Gordon, female    DOB: 07/05/1951, 71 y.o.   MRN: [id number]  HPI  Valerie Gordon is a very pleasant 71 y.o. female with a history of GERD, hypothyroidism, type 2 diabetes, hyperlipidemia, Covid-19 infection who presents today for complete physical and follow up of chronic conditions. She would also like to discuss several things.  Feeling down and depressed for the last 3-6 months. Symptoms include little motivation to do things, not wanting to do anything, can't get herself back on track with her health.     Blood pressure reading was elevated during her lab visit last week, initial reading of 174/88, reduced to 140/80. Because of this she purchased a wrist cuff and has been checking BP at home which is running about 150's/80's.   She admits to a poor diet and being more sedentary as she had been traveling since October 2022. She has had a hard time getting back on track. She suspects this is why her BP and A1C are hight.   Covid-19 infection in October 2022, lasted for months. Has continued to feel fatigued since then. She developed a new cough with sinus pressure, rhinorrhea, excessive mucous from her nasal cavity for the last 10 days. Started feeling better 2-3 days ago but then started feeling worse. She's been taking Alka-Seltzer Cold and Sinus, Tessalon Perles.   Immunizations: -Tetanus: 2017 -Influenza: Did not complete this season, due today -Covid-19: 3 vaccines  -Shingles: Completed Shingrix and Zostavax -Pneumonia: Prevnar 13 in 2021, Pneumovax in 2017  Diet: Grandfather.  Exercise: No regular exercise.  Eye exam: Completes annually  Dental exam: Completes semi-annually   Mammogram: Completed in February 2021, ordered and pending Dexa: No recent scan. Patient never completed from March 2022 Colonoscopy: Completed in 2017, due 2027  BP Readings from Last 3 Encounters:  01/16/22 (!) 146/78  08/27/21 134/76  02/26/21 136/78    Wt  Readings from Last 3 Encounters:  01/16/22 211 lb 3 oz (95.8 kg)  12/01/21 210 lb (95.3 kg)  10/14/21 210 lb (95.3 kg)      Diagnosed with Covid-19 infection in mid October 2022.    Review of Systems  Constitutional:  Positive for fatigue. Negative for unexpected weight change.  HENT:  Positive for congestion, rhinorrhea and sinus pressure.   Eyes:  Negative for visual disturbance.  Respiratory:  Positive for cough. Negative for shortness of breath.   Cardiovascular:  Negative for chest pain.  Gastrointestinal:  Negative for constipation and diarrhea.  Genitourinary:  Negative for difficulty urinating.  Musculoskeletal:  Negative for arthralgias and myalgias.  Skin:  Negative for rash.  Allergic/Immunologic: Negative for environmental allergies.  Neurological:  Negative for dizziness and headaches.  Psychiatric/Behavioral:  The patient is not nervous/anxious.         Past Medical History:  Diagnosis Date   Cataract    COVID-19 virus infection 10/14/2021   Depression    GERD (gastroesophageal reflux disease)    Glaucoma    Thyroid disease    Type 2 diabetes mellitus ([place])    manages with diet and exercise    Social History   Socioeconomic History   Marital status: Widowed    Spouse name: Not on file   Number of children: Not on file   Years of education: Not on file   Highest education level: Not on file  Occupational History   Not on file  Tobacco Use   Smoking status: Never  Smokeless tobacco: Never  Vaping Use   Vaping Use: Never used  Substance and Sexual Activity   Alcohol use: Yes    Comment: occasional wine   Drug use: No   Sexual activity: Yes  Other Topics Concern   Not on file  Social History Narrative   Widower.   Step children.   Environmental health practitioner.   Enjoys riding hot air balloons, spending time with friends, reading, jewelry   Social Determinants of Health   Financial Resource Strain: Low Risk    Difficulty of Paying  Living Expenses: Not hard at all  Food Insecurity: No Food Insecurity   Worried About Charity fundraiser in the Last Year: Never true   Arboriculturist in the Last Year: Never true  Transportation Needs: No Transportation Needs   Lack of Transportation (Medical): No   Lack of Transportation (Non-Medical): No  Physical Activity: Sufficiently Active   Days of Exercise per Week: 4 days   Minutes of Exercise per Session: 40 min  Stress: No Stress Concern Present   Feeling of Stress : Not at all  Social Connections: Moderately Isolated   Frequency of Communication with Friends and Family: More than three times a week   Frequency of Social Gatherings with Friends and Family: Three times a week   Attends Religious Services: Never   Active Member of Clubs or Organizations: Yes   Attends Archivist Meetings: More than 4 times per year   Marital Status: Widowed  Intimate Partner Violence: Not At Risk   Fear of Current or Ex-Partner: No   Emotionally Abused: No   Physically Abused: No   Sexually Abused: No    Past Surgical History:  Procedure Laterality Date   BREAST BIOPSY  2010   CARDIAC CATHETERIZATION  1998   COLONOSCOPY  10-30-2004   COLONOSCOPY WITH PROPOFOL N/A 04/01/2016   Procedure: COLONOSCOPY WITH PROPOFOL;  Surgeon: Robert Bellow, MD;  Location: [facility] ENDOSCOPY;  Service: Endoscopy;  Laterality: N/A;   EYE SURGERY Bilateral    cataract sx   TONSILLECTOMY      Family History  Problem Relation Age of Onset   Stroke Mother    Heart disease Father     Allergies  Allergen Reactions   Antihistamines, Chlorpheniramine-Type    Ciprocin-Fluocin-Procin [Fluocinolone]    Trulicity [Dulaglutide]     Current Outpatient Medications on File Prior to Visit  Medication Sig Dispense Refill   b complex vitamins tablet Take 1 tablet by mouth daily.     benzonatate (TESSALON) 200 MG capsule Take 1 capsule (200 mg total) by mouth 3 (three) times daily as needed for cough.  15 capsule 0   BIOTIN PO Take 5 mg by mouth.     Cholecalciferol 50 MCG (2000 UT) CAPS Take by mouth.     Insulin Glargine (BASAGLAR KWIKPEN) 100 UNIT/ML Inject 32 Units into the skin daily. For diabetes. 30 mL 3   Insulin Pen Needle (B-D ULTRAFINE III SHORT PEN) 31G X 8 MM MISC USE NIGHTLY WITH INSULIN 100 each 1   Krill Oil 300 MG CAPS Take by mouth.     latanoprost (XALATAN) 0.005 % ophthalmic solution 1 drop at bedtime.     levothyroxine (SYNTHROID) 100 MCG tablet Take 1 tablet by mouth every morning on an empty stomach with water only.  No food or other medications for 30 minutes. 90 tablet 0   Magnesium 200 MG TABS Take by mouth.  Menaquinone-7 (VITAMIN K2 PO) Take 19 mcg by mouth.     metFORMIN (GLUCOPHAGE XR) 500 MG 24 hr tablet Take 2 tablets (1,000 mg total) by mouth 2 (two) times daily with a meal. For diabetes. 360 tablet 1   rosuvastatin (CRESTOR) 10 MG tablet TAKE 1 TABLET BY MOUTH ONCE DAILY FOR CHOLESTEROL 90 tablet 0   VITAMIN A PO Take 2,400 mcg by mouth.     No current facility-administered medications on file prior to visit.    BP (!) 146/78 (BP Location: Left Arm, Patient Position: Sitting, Cuff Size: Large)    Pulse 72    Temp 97.6 F (36.4 C) (Temporal)    Ht 5\' 1"  (1.549 m)    Wt 211 lb 3 oz (95.8 kg)    SpO2 98%    BMI 39.90 kg/m  Objective:   Physical Exam HENT:     Right Ear: Tympanic membrane and ear canal normal.     Left Ear: Tympanic membrane and ear canal normal.     Nose: Nose normal.  Eyes:     Conjunctiva/sclera: Conjunctivae normal.     Pupils: Pupils are equal, round, and reactive to light.  Neck:     Thyroid: No thyromegaly.  Cardiovascular:     Rate and Rhythm: Normal rate and regular rhythm.     Heart sounds: No murmur heard. Pulmonary:     Effort: Pulmonary effort is normal.     Breath sounds: Normal breath sounds. No rales.  Abdominal:     General: Bowel sounds are normal.     Palpations: Abdomen is soft.     Tenderness: There is no  abdominal tenderness.  Musculoskeletal:        General: Normal range of motion.     Cervical back: Neck supple.  Lymphadenopathy:     Cervical: No cervical adenopathy.  Skin:    General: Skin is warm and dry.     Findings: No rash.  Neurological:     Mental Status: She is alert and oriented to person, place, and time.     Cranial Nerves: No cranial nerve deficit.     Deep Tendon Reflexes: Reflexes are normal and symmetric.  Psychiatric:        Mood and Affect: Mood normal.          Assessment & Plan:      This visit occurred during the SARS-CoV-2 public health emergency.  Safety protocols were in place, including screening questions prior to the visit, additional usage of staff PPE, and extensive cleaning of exam room while observing appropriate contact time as indicated for disinfecting solutions.

## 2022-01-16 NOTE — Assessment & Plan Note (Signed)
She is taking levothyroxine 100 mcg incorrectly. Discussed correct directions for taking levothyroxine.  TSH stable. Reviewed labs from chart.  Continue levothyroxine 100 mcg.

## 2022-01-16 NOTE — Patient Instructions (Signed)
[facility] your mamm[facility] and bone density scan.   You will be contacted regarding your referral to therapy.  Please let us know if you have not been contacted within two weeks.   Start Glipizide 5 mg pills for diabetes, take this twice daily.   Continue to monitor your blood pressure. You should run less than 140 on top and 90 on bottom.  Start Augmentin antibiotics for the infection Take 1 tablet by mouth twice daily for 7 days.  Schedule a follow up visit for 3-4 weeks for blood pressure check. Schedule a follow up visit for 3 months for diabetes check.  It was a pleasure to see you today!   Preventive Care 80 Years and Older, Female Preventive care refers to lifestyle choices and visits with your health care provider that can promote health and wellness. Preventive care visits are also called wellness exams. What can I expect for my preventive care visit? Counseling Your health care provider may ask you questions about your: Medical history, including: Past medical problems. Family medical history. Pregnancy and menstrual history. History of falls. Current health, including: Memory and ability to understand (cognition). Emotional well-being. Home life and relationship well-being. Sexual activity and sexual health. Lifestyle, including: Alcohol, nicotine or tobacco, and drug use. Access to firearms. Diet, exercise, and sleep habits. Work and work Statistician. Sunscreen use. Safety issues such as seatbelt and bike helmet use. Physical exam Your health care provider will check your: Height and weight. These may be used to calculate your BMI (body mass index). BMI is a measurement that tells if you are at a healthy weight. Waist circumference. This measures the distance around your waistline. This measurement also tells if you are at a healthy weight and may help predict your risk of certain diseases, such as type 2 diabetes and high blood  pressure. Heart rate and blood pressure. Body temperature. Skin for abnormal spots. What immunizations do I need? Vaccines are usually given at various ages, according to a schedule. Your health care provider will recommend vaccines for you based on your age, medical history, and lifestyle or other factors, such as travel or where you work. What tests do I need? Screening Your health care provider may recommend screening tests for certain conditions. This may include: Lipid and cholesterol levels. Hepatitis C test. Hepatitis B test. HIV (human immunodeficiency virus) test. STI (sexually transmitted infection) testing, if you are at risk. Lung cancer screening. Colorectal cancer screening. Diabetes screening. This is done by checking your blood sugar (glucose) after you have not eaten for a while (fasting). Mammogram. Talk with your health care provider about how often you should have regular mammograms. BRCA-related cancer screening. This may be done if you have a family history of breast, ovarian, tubal, or peritoneal cancers. Bone density scan. This is done to screen for osteoporosis. Talk with your health care provider about your test results, treatment options, and if necessary, the need for more tests. Follow these instructions at home: Eating and drinking  Eat a diet that includes fresh fruits and vegetables, whole grains, lean protein, and low-fat dairy products. Limit your intake of foods with high amounts of sugar, saturated fats, and salt. Take vitamin and mineral supplements as recommended by your health care provider. Do not drink alcohol if your health care provider tells you not to drink. If you drink alcohol: Limit how much you have to 0-1 drink a day. Know how much alcohol is in your drink. In the  [place] one drink equals one 12 oz bottle of beer (355 mL), one 5 oz glass of wine (148 mL), or one 1 oz glass of hard liquor (44 mL). Lifestyle Brush your teeth every morning  and night with fluoride toothpaste. Floss one time each day. Exercise for at least 30 minutes 5 or more days each week. Do not use any products that contain nicotine or tobacco. These products include cigarettes, chewing tobacco, and vaping devices, such as e-cigarettes. If you need help quitting, ask your health care provider. Do not use drugs. If you are sexually active, practice safe sex. Use a condom or other form of protection in order to prevent STIs. Take aspirin only as told by your health care provider. Make sure that you understand how much to take and what form to take. Work with your health care provider to find out whether it is safe and beneficial for you to take aspirin daily. Ask your health care provider if you need to take a cholesterol-lowering medicine (statin). Find healthy ways to manage stress, such as: Meditation, yoga, or listening to music. Journaling. Talking to a trusted person. Spending time with friends and family. Minimize exposure to UV radiation to reduce your risk of skin cancer. Safety Always wear your seat belt while driving or riding in a vehicle. Do not drive: If you have been drinking alcohol. Do not ride with someone who has been drinking. When you are tired or distracted. While texting. If you have been using any mind-altering substances or drugs. Wear a helmet and other protective equipment during sports activities. If you have firearms in your house, make sure you follow all gun safety procedures. What's next? Visit your health care provider once a year for an annual wellness visit. Ask your health care provider how often you should have your eyes and teeth checked. Stay up to date on all vaccines. This information is not intended to replace advice given to you by your health care provider. Make sure you discuss any questions you have with your health care provider. Document Revised: 06/11/2021 Document Reviewed: 06/11/2021 Elsevier Patient  Education  [place].

## 2022-01-16 NOTE — Assessment & Plan Note (Signed)
Chronic, worse over last 3-6 months.  She agrees to start therapy. Referral placed.

## 2022-01-16 NOTE — Assessment & Plan Note (Signed)
Acute for the last 10 days, symptoms are worse.  Rx for Augmentin course BID x 7 days sent to pharmacy.  She will update.

## 2022-01-16 NOTE — Assessment & Plan Note (Signed)
Will hold off on influenza vaccine as she is acutely ill today. Other vaccines UTD.  Mammogram due and pending. Bone density scan due and pending. Colonoscopy UTD, due 2027.  Discussed the importance of a healthy diet and regular exercise in order for weight loss, and to reduce the risk of further co-morbidity.  Exam today as noted Labs reviewed with patient from Epic

## 2022-01-16 NOTE — Assessment & Plan Note (Addendum)
Uncontrolled with recent A1C of 8.5.  Poor diet, not exercising which is largely secondary to depression. She understands a healthy diet and the need to exercise.  Will add Glipizide 5 mg BID, per patient request. Continue metformin XR 1000 mg BID. Continue Basaglar 30 units.  Follow up in 3 months. She will update sooner if glucose readings remain at or above 150 consistently.

## 2022-01-20 MED ORDER — BENZONATATE 200 MG PO CAPS: 200.0000 mg | ORAL_CAPSULE | Freq: Three times a day (TID) | ORAL | 0 refills | Status: DC | PRN

## 2022-01-21 ENCOUNTER — Ambulatory Visit (INDEPENDENT_AMBULATORY_CARE_PROVIDER_SITE_OTHER): Payer: Medicare HMO | Admitting: Psychologist

## 2022-01-21 DIAGNOSIS — F330 Major depressive disorder, recurrent, mild: Secondary | ICD-10-CM | POA: Diagnosis not present

## 2022-01-21 DIAGNOSIS — R69 Illness, unspecified: Secondary | ICD-10-CM | POA: Diagnosis not present

## 2022-01-21 NOTE — Plan of Care (Signed)

## 2022-01-21 NOTE — Progress Notes (Signed)
[place] Counselor Initial Adult Exam  Name: Valerie Gordon Date: 01/21/2022 MRN: [id number] DOB: 14-May-1951 PCP: Pleas Koch, NP  Time spent: 3:02 pm to 3:40 pm; total time: 38 minutes  This session was held via video webex teletherapy due to the coronavirus risk at this time. The patient consented to video teletherapy and was located at her home during this session. She is aware it is the responsibility of the patient to secure confidentiality on her end of the session. The provider was in a private home office for the duration of this session. Limits of confidentiality were discussed with the patient.   Guardian/Payee:  NA    Paperwork requested: No   Reason for Visit /Presenting Problem: Depression  Mental Status Exam: Appearance:   Well Groomed     Behavior:  Appropriate  Motor:  Normal  Speech/Language:   Clear and Coherent  Affect:  Appropriate  Mood:  normal  Thought process:  normal  Thought content:    WNL  Sensory/Perceptual disturbances:    WNL  Orientation:  oriented to person, place, and time/date  Attention:  Good  Concentration:  Good  Memory:  WNL  Fund of knowledge:   Good  Insight:    Fair  Judgment:   Fair  Impulse Control:  Good     Reported Symptoms:  The patient endorsed experiencing the following: feeling down, sad, lack of motivation, low self-esteem, fatigue, rumination of negative thoughts, social isolation, and avoiding pleasurable activities. She denied suicidal and homicidal ideation.   Risk Assessment: Danger to Self:  No Self-injurious Behavior: No Danger to Others: No Duty to Warn:no Physical Aggression / Violence:No  Access to Firearms a concern: No  Gang Involvement:No  Patient / guardian was educated about steps to take if suicide or homicide risk level increases between visits: n/a While future psychiatric events cannot be accurately predicted, the patient does not currently require acute inpatient  psychiatric care and does not currently meet [facility] involuntary commitment criteria.  Substance Abuse History: Current substance abuse: No     Past Psychiatric History:   Previous psychological history is significant for depression Outpatient Providers:Dr. Johna Roles History of Psych Hospitalization: No  Psychological Testing:  NA    Abuse History:  Victim of: No.,  NA    Report needed: No. Victim of Neglect:No. Perpetrator of  NA   Witness / Exposure to Domestic Violence: No   Protective Services Involvement: No  Witness to [employer] Violence:  No   Family History:  Family History  Problem Relation Age of Onset   Stroke Mother    Heart disease Father     Living situation: the patient lives alone  Sexual Orientation: Straight  Relationship Status: Patient stated that she has a partner named Yvone Neu and that they have been together for 18 years. Per the patient, she was married twice previously. The first one ended after divorce and the second after her spouse died from a brain tumor.    Name of spouse / other:Ken If a parent, number of children / ages:Patient indicated that she has two step children from her second marriage. She described herself as not having a close relationship with them.   Support Systems: spouse  Financial Stress:  No   Income/Employment/Disability: Employment. 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   Military Service: No   Educational History: Education: Museum/gallery exhibitions officer: Patient identified as Engineer, manufacturing.   Any cultural differences that may affect / interfere with treatment:  not applicable   Recreation/Hobbies: Hot air balloons  Stressors: Other: Patient just voiced having stressors.     Strengths: Supportive Relationships, Hopefulness, and Self Advocate  Barriers:  NA   Legal History: Pending legal issue / charges: The patient has no significant history of legal issues. History of legal issue /  charges:  NA  Medical History/Surgical History: reviewed Past Medical History:  Diagnosis Date   Cataract    COVID-19 virus infection 10/14/2021   Depression    GERD (gastroesophageal reflux disease)    Glaucoma    Thyroid disease    Type 2 diabetes mellitus ([place])    manages with diet and exercise    Past Surgical History:  Procedure Laterality Date   BREAST BIOPSY  2010   CARDIAC CATHETERIZATION  1998   COLONOSCOPY  10-30-2004   COLONOSCOPY WITH PROPOFOL N/A 04/01/2016   Procedure: COLONOSCOPY WITH PROPOFOL;  Surgeon: Robert Bellow, MD;  Location: [facility] ENDOSCOPY;  Service: Endoscopy;  Laterality: N/A;   EYE SURGERY Bilateral    cataract sx   TONSILLECTOMY      Medications: Current Outpatient Medications  Medication Sig Dispense Refill   amoxicillin-clavulanate (AUGMENTIN) 875-125 MG tablet Take 1 tablet by mouth 2 (two) times daily. 14 tablet 0   b complex vitamins tablet Take 1 tablet by mouth daily.     benzonatate (TESSALON) 200 MG capsule Take 1 capsule (200 mg total) by mouth 3 (three) times daily as needed for cough. 15 capsule 0   BIOTIN PO Take 5 mg by mouth.     Cholecalciferol 50 MCG (2000 UT) CAPS Take by mouth.     glipiZIDE (GLUCOTROL) 5 MG tablet Take 1 tablet (5 mg total) by mouth 2 (two) times daily before a meal. For diabetes. 180 tablet 3   Insulin Glargine (BASAGLAR KWIKPEN) 100 UNIT/ML Inject 32 Units into the skin daily. For diabetes. 30 mL 3   Insulin Pen Needle (B-D ULTRAFINE III SHORT PEN) 31G X 8 MM MISC USE NIGHTLY WITH INSULIN 100 each 1   Krill Oil 300 MG CAPS Take by mouth.     latanoprost (XALATAN) 0.005 % ophthalmic solution 1 drop at bedtime.     levothyroxine (SYNTHROID) 100 MCG tablet Take 1 tablet by mouth every morning on an empty stomach with water only.  No food or other medications for 30 minutes. 90 tablet 0   Magnesium 200 MG TABS Take by mouth.     Menaquinone-7 (VITAMIN K2 PO) Take 19 mcg by mouth.     metFORMIN (GLUCOPHAGE XR)  500 MG 24 hr tablet Take 2 tablets (1,000 mg total) by mouth 2 (two) times daily with a meal. For diabetes. 360 tablet 1   rosuvastatin (CRESTOR) 10 MG tablet TAKE 1 TABLET BY MOUTH ONCE DAILY FOR CHOLESTEROL 90 tablet 0   VITAMIN A PO Take 2,400 mcg by mouth.     No current facility-administered medications for this visit.    Allergies  Allergen Reactions   Antihistamines, Chlorpheniramine-Type    Ciprocin-Fluocin-Procin [Fluocinolone]    Trulicity [Dulaglutide]     Diagnoses:  F33.0 major depressive affective disorder, recurrent, mild  Plan of Care: The patient is a 71 year old Caucasian female who was referred due to experiencing depression. The patient lives alone though she has a significant other Yvone Neu) who she has been in a relationship with for 18 years. The patient meets criteria for a diagnosis of F33.0 major depressive affective disorder, recurrent, mild based off of the following: feeling  down, sad, lack of motivation, low self-esteem, fatigue, rumination of negative thoughts, social isolation, and avoiding pleasurable activities. She denied suicidal and homicidal ideation.   The patient voiced wanting to find purpose and motivation to complete tasks  This psychologist makes the recommendation that the patient participate in therapy at least monthly and if possible bi-weekly.    Conception Chancy, PsyD

## 2022-01-21 NOTE — Progress Notes (Signed)
                Roczen Waymire, PsyD 

## 2022-02-06 ENCOUNTER — Ambulatory Visit: Payer: Medicare HMO | Admitting: Primary Care

## 2022-02-11 ENCOUNTER — Encounter: Payer: Self-pay | Admitting: Primary Care

## 2022-02-11 ENCOUNTER — Ambulatory Visit (INDEPENDENT_AMBULATORY_CARE_PROVIDER_SITE_OTHER): Payer: Medicare HMO | Admitting: Primary Care

## 2022-02-11 ENCOUNTER — Other Ambulatory Visit: Payer: Self-pay

## 2022-02-11 DIAGNOSIS — R030 Elevated blood-pressure reading, without diagnosis of hypertension: Secondary | ICD-10-CM | POA: Diagnosis not present

## 2022-02-11 NOTE — Patient Instructions (Signed)
Work to increase physical activity as tolerated.  Continue to work on [employer].  It was a pleasure to see you today!

## 2022-02-11 NOTE — Progress Notes (Signed)
Subjective:    Patient ID: Valerie Gordon, female    DOB: 1951/09/30, 71 y.o.   MRN: [id number]  HPI  Valerie Gordon is a very pleasant 71 y.o. female with a history of type 2 diabetes, hypothyroidism, hyperlipidemia who presents today to follow up for blood pressure.  No prior diagnosis of hypertension. During her visit one month ago BP was noted to be elevated. She was also acutely ill with URI symptoms at the time. Given her symptoms and readings we discussed to follow up today for re-evaluation of hypertension.  Since her last visit she is monitoring her BP at home and is getting readings of 140-150's/70's-80's, today her BP today 158/84. This is a wrist cuff.   Chronic fatigue for years, waxes and wanes. History of chronic depression and fatigue days don't seem to correlate. Today she is feeling fatigued. She is set up for therapy, first full session is next week.   She is checking her glucose readings which are ranging 140-150's fasting in AM, mid afternoon 130's-140's.   She does have residual sinus pressure from her prior illness, Flonase has helped some, but this causes constipation. She is using saline sprays. She is sedentary, no regular exercise.   BP Readings from Last 3 Encounters:  02/11/22 138/72  01/16/22 (!) 146/78  08/27/21 134/76        Review of Systems  Constitutional:  Positive for fatigue. Negative for chills and fever.  Respiratory:  Negative for cough and shortness of breath.   Psychiatric/Behavioral:         Depression         Past Medical History:  Diagnosis Date   Cataract    COVID-19 virus infection 10/14/2021   Depression    GERD (gastroesophageal reflux disease)    Glaucoma    Thyroid disease    Type 2 diabetes mellitus ([place])    manages with diet and exercise    Social History   Socioeconomic History   Marital status: Widowed    Spouse name: Not on file   Number of children: Not on file   Years of education: Not on file    Highest education level: Not on file  Occupational History   Not on file  Tobacco Use   Smoking status: Never   Smokeless tobacco: Never  Vaping Use   Vaping Use: Never used  Substance and Sexual Activity   Alcohol use: Yes    Comment: occasional wine   Drug use: No   Sexual activity: Yes  Other Topics Concern   Not on file  Social History Narrative   Widower.   Step children.   Environmental health practitioner.   Enjoys riding hot air balloons, spending time with friends, reading, jewelry   Social Determinants of Health   Financial Resource Strain: Low Risk    Difficulty of Paying Living Expenses: Not hard at all  Food Insecurity: No Food Insecurity   Worried About Charity fundraiser in the Last Year: Never true   Arboriculturist in the Last Year: Never true  Transportation Needs: No Transportation Needs   Lack of Transportation (Medical): No   Lack of Transportation (Non-Medical): No  Physical Activity: Sufficiently Active   Days of Exercise per Week: 4 days   Minutes of Exercise per Session: 40 min  Stress: No Stress Concern Present   Feeling of Stress : Not at all  Social Connections: Moderately Isolated   Frequency of Communication with Friends  and Family: More than three times a week   Frequency of Social Gatherings with Friends and Family: Three times a week   Attends Religious Services: Never   Active Member of Clubs or Organizations: Yes   Attends Archivist Meetings: More than 4 times per year   Marital Status: Widowed  Intimate Partner Violence: Not At Risk   Fear of Current or Ex-Partner: No   Emotionally Abused: No   Physically Abused: No   Sexually Abused: No    Past Surgical History:  Procedure Laterality Date   BREAST BIOPSY  2010   CARDIAC CATHETERIZATION  1998   COLONOSCOPY  10-30-2004   COLONOSCOPY WITH PROPOFOL N/A 04/01/2016   Procedure: COLONOSCOPY WITH PROPOFOL;  Surgeon: Robert Bellow, MD;  Location: [facility] ENDOSCOPY;  Service:  Endoscopy;  Laterality: N/A;   EYE SURGERY Bilateral    cataract sx   TONSILLECTOMY      Family History  Problem Relation Age of Onset   Stroke Mother    Heart disease Father     Allergies  Allergen Reactions   Antihistamines, Chlorpheniramine-Type    Ciprocin-Fluocin-Procin [Fluocinolone]    Trulicity [Dulaglutide]     Current Outpatient Medications on File Prior to Visit  Medication Sig Dispense Refill   amoxicillin-clavulanate (AUGMENTIN) 875-125 MG tablet Take 1 tablet by mouth 2 (two) times daily. 14 tablet 0   b complex vitamins tablet Take 1 tablet by mouth daily.     benzonatate (TESSALON) 200 MG capsule Take 1 capsule (200 mg total) by mouth 3 (three) times daily as needed for cough. 15 capsule 0   BIOTIN PO Take 5 mg by mouth.     Cholecalciferol 50 MCG (2000 UT) CAPS Take by mouth.     glipiZIDE (GLUCOTROL) 5 MG tablet Take 1 tablet (5 mg total) by mouth 2 (two) times daily before a meal. For diabetes. 180 tablet 3   Insulin Glargine (BASAGLAR KWIKPEN) 100 UNIT/ML Inject 32 Units into the skin daily. For diabetes. 30 mL 3   Insulin Pen Needle (B-D ULTRAFINE III SHORT PEN) 31G X 8 MM MISC USE NIGHTLY WITH INSULIN 100 each 1   Krill Oil 300 MG CAPS Take by mouth.     latanoprost (XALATAN) 0.005 % ophthalmic solution 1 drop at bedtime.     levothyroxine (SYNTHROID) 100 MCG tablet Take 1 tablet by mouth every morning on an empty stomach with water only.  No food or other medications for 30 minutes. 90 tablet 0   Magnesium 200 MG TABS Take by mouth.     Menaquinone-7 (VITAMIN K2 PO) Take 19 mcg by mouth.     metFORMIN (GLUCOPHAGE XR) 500 MG 24 hr tablet Take 2 tablets (1,000 mg total) by mouth 2 (two) times daily with a meal. For diabetes. 360 tablet 1   rosuvastatin (CRESTOR) 10 MG tablet TAKE 1 TABLET BY MOUTH ONCE DAILY FOR CHOLESTEROL 90 tablet 0   VITAMIN A PO Take 2,400 mcg by mouth.     No current facility-administered medications on file prior to visit.    BP  138/72    Pulse 80    Temp 98.6 F (37 C) (Temporal)    Ht 5\' 1"  (1.549 m)    Wt 211 lb (95.7 kg)    SpO2 96%    BMI 39.87 kg/m  Objective:   Physical Exam Cardiovascular:     Rate and Rhythm: Normal rate and regular rhythm.  Pulmonary:     Effort: Pulmonary effort is  normal.     Breath sounds: Normal breath sounds.  Musculoskeletal:     Cervical back: Neck supple.  Skin:    General: Skin is warm and dry.  Psychiatric:        Mood and Affect: Mood normal.          Assessment & Plan:      This visit occurred during the SARS-CoV-2 public health emergency.  Safety protocols were in place, including screening questions prior to the visit, additional usage of staff PPE, and extensive cleaning of exam room while observing appropriate contact time as indicated for disinfecting solutions.

## 2022-02-11 NOTE — Assessment & Plan Note (Signed)
Improved since last visit.  Home readings are higher than our manual office reading, she is using a wrist cuff.   Will hold off on treatment now as she prefers to work on lifestyle changes. She will work with her therapist regarding her depression so that she can increase physical activity.  Follow up in late April 2023.

## 2022-02-14 ENCOUNTER — Other Ambulatory Visit: Payer: Self-pay | Admitting: Primary Care

## 2022-02-14 DIAGNOSIS — E785 Hyperlipidemia, unspecified: Secondary | ICD-10-CM

## 2022-02-16 ENCOUNTER — Ambulatory Visit (INDEPENDENT_AMBULATORY_CARE_PROVIDER_SITE_OTHER): Payer: Medicare HMO | Admitting: Psychologist

## 2022-02-16 DIAGNOSIS — R69 Illness, unspecified: Secondary | ICD-10-CM | POA: Diagnosis not present

## 2022-02-16 DIAGNOSIS — F330 Major depressive disorder, recurrent, mild: Secondary | ICD-10-CM

## 2022-02-16 NOTE — Progress Notes (Signed)
                Anesia Blackwell, PsyD 

## 2022-02-16 NOTE — Progress Notes (Signed)
[place] Counselor/Therapist Progress Note  Patient ID: Valerie Gordon, MRN: [id number],    Date: 02/16/2022  Time Spent: 3:04 pm to 3:45 pm; total time:41 minutes   This session was held via video webex teletherapy due to the coronavirus risk at this time. The patient consented to video teletherapy and was located at her home during this session. She is aware it is the responsibility of the patient to secure confidentiality on her end of the session. The provider was in a private home office for the duration of this session. Limits of confidentiality were discussed with the patient.   Treatment Type: Individual Therapy  Reported Symptoms: Depressive symptoms  Mental Status Exam: Appearance:  Casual     Behavior: Appropriate  Motor: Normal  Speech/Language:  Clear and Coherent  Affect: Appropriate  Mood: normal  Thought process: normal  Thought content:   WNL  Sensory/Perceptual disturbances:   WNL  Orientation: oriented to person, place, and time/date  Attention: Good  Concentration: Good  Memory: WNL  Fund of knowledge:  Good  Insight:   Fair  Judgment:  Fair  Impulse Control: Good   Risk Assessment: Danger to Self:  No Self-injurious Behavior: No Danger to Others: No Duty to Warn:no Physical Aggression / Violence:No  Access to Firearms a concern: No  Gang Involvement:No   Subjective: Beginning the session, patient reviewed the treatment plan. After reviewed the treatment plan, the patient discussed events since the last session. She described herself as experiencing a state of sadness and processed this emotion. She then reflected on the homework assignment given to her regarding looking at her values. She described the assignment as hard. She processed through different values before landing on the value of being physically healthy. Patient spent time developing a SMART goal related to being physically healthy. She was agreeable to homework and following  up. She denied suicidal and homicidal ideation.    Interventions:  Worked on developing a therapeutic relationship with the patient using active listening and reflective statements. Provided emotional support using empathy and validation. Reviewed the treatment plan with the patient. Reviewed events since the intake. Normalized and validated patient's experiences with the value card sort. Processed thoughts and emotions related to the value card sort. Explored the values that are most meaningful to the patient. Normalized some of the patient's responses. Provided psychoeducation about extroversion and introversion. Challenged some of the thoughts expressed. Used socratic questions to assist the patient gain insight. Identified the value of being physically active. Assisted the patient in developing a SMART goal. Explored obstacles and identified ways to overcome obstacles. Processed thoughts and emotions. Assigned homework. Assessed for suicidal and homicidal ideation.   Homework: Implement SMART goal of being on elliptical 14 minutes a  day  Next Session: Review homework and emotional support   Diagnosis: F33.0 major depressive affective disorder, recurrent, mild   Plan:   Client Abilities: Friendly and easy to develop rapport  Client Preferences: ACT and CBT  Client statement of Needs: Coping skills and process emotions  Treatment Level: Outpatient   Goals Alleviate depressive symptoms Recognize, accept, and cope with depressive feelings Develop healthy thinking patterns Develop healthy interpersonal relationships  Objectives target date for all objectives is 01/21/2023 Cooperate with a medication evaluation by a physician Verbalize an accurate understanding of depression Verbalize an understanding of the treatment Identify and replace thoughts that support depression Learn and implement behavioral strategies Verbalize an understanding and resolution of current interpersonal  problems Learn and implement problem  solving and decision making skills Learn and implement conflict resolution skills to resolve interpersonal problems Verbalize an understanding of healthy and unhealthy emotions verbalize insight into how past relationships may be influence current experiences with depression Use mindfulness and acceptance strategies and increase value based behavior  Increase hopeful statements about the future.  Interventions Consistent with treatment model, discuss how change in cognitive, behavioral, and interpersonal can help client alleviate depression CBT Behavioral activation help the client explore the relationship, nature of the dispute,  Help the client develop new interpersonal skills and relationships Conduct Problem so living therapy Teach conflict resolution skills Use a process-experiential approach Conduct TLDP Conduct ACT Evaluate need for psychotropic medication Monitor adherence to medication   The patient and clinician reviewed the treatment plan on 02/16/2022. The patient approved of the treatment plan.   Conception Chancy, PsyD

## 2022-02-17 ENCOUNTER — Ambulatory Visit: Payer: Medicare HMO | Admitting: Psychologist

## 2022-03-11 ENCOUNTER — Ambulatory Visit (INDEPENDENT_AMBULATORY_CARE_PROVIDER_SITE_OTHER): Payer: Medicare HMO | Admitting: Psychologist

## 2022-03-11 DIAGNOSIS — F330 Major depressive disorder, recurrent, mild: Secondary | ICD-10-CM | POA: Diagnosis not present

## 2022-03-11 DIAGNOSIS — R69 Illness, unspecified: Secondary | ICD-10-CM | POA: Diagnosis not present

## 2022-03-11 NOTE — Progress Notes (Signed)
[place] Counselor/Therapist Progress Note ? ?Patient ID: Valerie Gordon, MRN: [id number],   ? ?Date: 03/11/2022 ? ?Time Spent: 3:03 pm to 3:45 pm; total time: 42 minutes ? ? This session was held via video webex teletherapy due to the coronavirus risk at this time. The patient consented to video teletherapy and was located at her home during this session. She is aware it is the responsibility of the patient to secure confidentiality on her end of the session. The provider was in a private home office for the duration of this session. Limits of confidentiality were discussed with the patient.  ? ?Treatment Type: Individual Therapy ? ?Reported Symptoms: Depressive symptoms ? ?Mental Status Exam: ?Appearance:  Casual     ?Behavior: Appropriate  ?Motor: Normal  ?Speech/Language:  Clear and Coherent  ?Affect: Appropriate  ?Mood: normal  ?Thought process: normal  ?Thought content:   WNL  ?Sensory/Perceptual disturbances:   WNL  ?Orientation: oriented to person, place, and time/date  ?Attention: Good  ?Concentration: Good  ?Memory: WNL  ?Fund of knowledge:  Good  ?Insight:   Fair  ?Judgment:  Fair  ?Impulse Control: Good  ? ?Risk Assessment: ?Danger to Self:  No ?Self-injurious Behavior: No ?Danger to Others: No ?Duty to Warn:no ?Physical Aggression / Violence:No  ?Access to Firearms a concern: No  ?Gang Involvement:No  ? ?Subjective: Beginning the session, patient stated that she has been doing well. Elaborating, she voiced that she has been able to be more physically active and is noticing a difference. From there, she wanted address irritability she is experiencing and explored experiences of irritability. She processed thoughts and emotions related to irritability. From there, she reflected on the theme of being mindful. Patient practiced and processed mindfulness. She described herself as experiencing less distress after practicing mindfulness. She was agreeable to homework and following up. She  denied suicidal and homicidal ideation.   ? ?Interventions:  Worked on developing a therapeutic relationship with the patient using active listening and reflective statements. Provided emotional support using empathy and validation. Used summary statements. Reflected on events since the last session. Praised patient for being able to complete homework assignment and processed how patient felt about completing assignment. Explored etiology of irritability. Used socratic questions to assist the patient. Explored the idea of showing self compassion as she would show others. Challenged some of the thoughts expressed. Provided psychoeducation about groundedness and mindfulness. Practiced and processed mindfulness. Praised patient for experiencing less distress. Assisted in problem solving. Provided empathic statements. Assigned homework. Assessed for suicidal and homicidal ideation.  ? ?Homework: Implement mindfulness ? ?Next Session: Review homework and emotional support  ? ?Diagnosis: F33.0 major depressive affective disorder, recurrent, mild  ? ?Plan:  ? ?Client Abilities: Friendly and easy to develop rapport ? ?Client Preferences: ACT and CBT ? ?Client statement of Needs: Coping skills and process emotions ? ?Treatment Level: Outpatient  ? ?Goals ?Alleviate depressive symptoms ?Recognize, accept, and cope with depressive feelings ?Develop healthy thinking patterns ?Develop healthy interpersonal relationships ? ?Objectives target date for all objectives is 01/21/2023 ?Cooperate with a medication evaluation by a physician ?Verbalize an accurate understanding of depression ?Verbalize an understanding of the treatment ?Identify and replace thoughts that support depression ?Learn and implement behavioral strategies ?Verbalize an understanding and resolution of current interpersonal problems ?Learn and implement problem solving and decision making skills ?Learn and implement conflict resolution skills to resolve  interpersonal problems ?Verbalize an understanding of healthy and unhealthy emotions verbalize insight into how past relationships may be  influence current experiences with depression ?Use mindfulness and acceptance strategies and increase value based behavior  ?Increase hopeful statements about the future.  ?Interventions ?Consistent with treatment model, discuss how change in cognitive, behavioral, and interpersonal can help client alleviate depression ?CBT ?Behavioral activation help the client explore the relationship, nature of the dispute,  ?Help the client develop new interpersonal skills and relationships ?Conduct Problem so living therapy ?Teach conflict resolution skills ?Use a process-experiential approach ?Conduct TLDP ?Conduct ACT ?Evaluate need for psychotropic medication ?Monitor adherence to medication  ? ?The patient and clinician reviewed the treatment plan on 02/16/2022. The patient approved of the treatment plan.  ? ?Conception Chancy, PsyD ? ? ? ? ? ? ? ? ? ? ? ? ? ? ? ? ? ?Conception Chancy, PsyD ?

## 2022-03-22 ENCOUNTER — Other Ambulatory Visit: Payer: Self-pay | Admitting: Primary Care

## 2022-03-22 DIAGNOSIS — E119 Type 2 diabetes mellitus without complications: Secondary | ICD-10-CM

## 2022-03-25 ENCOUNTER — Ambulatory Visit
Admission: RE | Admit: 2022-03-25 | Discharge: 2022-03-25 | Disposition: A | Discharge status: Home / Self Care | Payer: Medicare HMO | Source: Ambulatory Visit | Attending: Primary Care | Admitting: Primary Care

## 2022-03-25 DIAGNOSIS — Z85828 Personal history of other malignant neoplasm of skin: Secondary | ICD-10-CM | POA: Diagnosis not present

## 2022-03-25 DIAGNOSIS — E2839 Other primary ovarian failure: Secondary | ICD-10-CM | POA: Insufficient documentation

## 2022-03-25 DIAGNOSIS — Z872 Personal history of diseases of the skin and subcutaneous tissue: Secondary | ICD-10-CM | POA: Diagnosis not present

## 2022-03-25 DIAGNOSIS — Z1231 Encounter for screening mammogram for malignant neoplasm of breast: Secondary | ICD-10-CM

## 2022-03-25 DIAGNOSIS — L578 Other skin changes due to chronic exposure to nonionizing radiation: Secondary | ICD-10-CM | POA: Diagnosis not present

## 2022-03-25 DIAGNOSIS — M85851 Other specified disorders of bone density and structure, right thigh: Secondary | ICD-10-CM | POA: Diagnosis not present

## 2022-03-25 IMAGING — MG MM DIGITAL SCREENING BILAT W/ TOMO AND CAD
8 series · 8 of 24 positions shown · non-contrast
Comparison: Previous exam(s).

CLINICAL DATA: Screening.

EXAM:
DIGITAL SCREENING BILATERAL MAMMOGRAM WITH TOMOSYNTHESIS AND CAD
TECHNIQUE: Bilateral screening digital craniocaudal and mediolateral oblique
mammograms were obtained. Bilateral screening digital breast
tomosynthesis was performed. The images were evaluated with
computer-aided detection.

[L CC synth-2D]
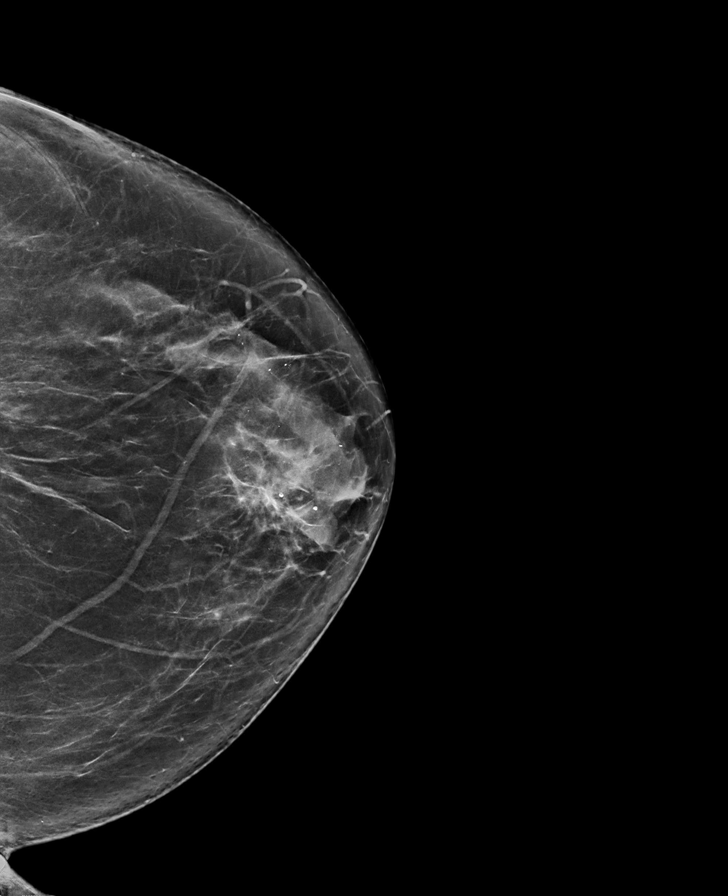

[R MLO synth-2D]
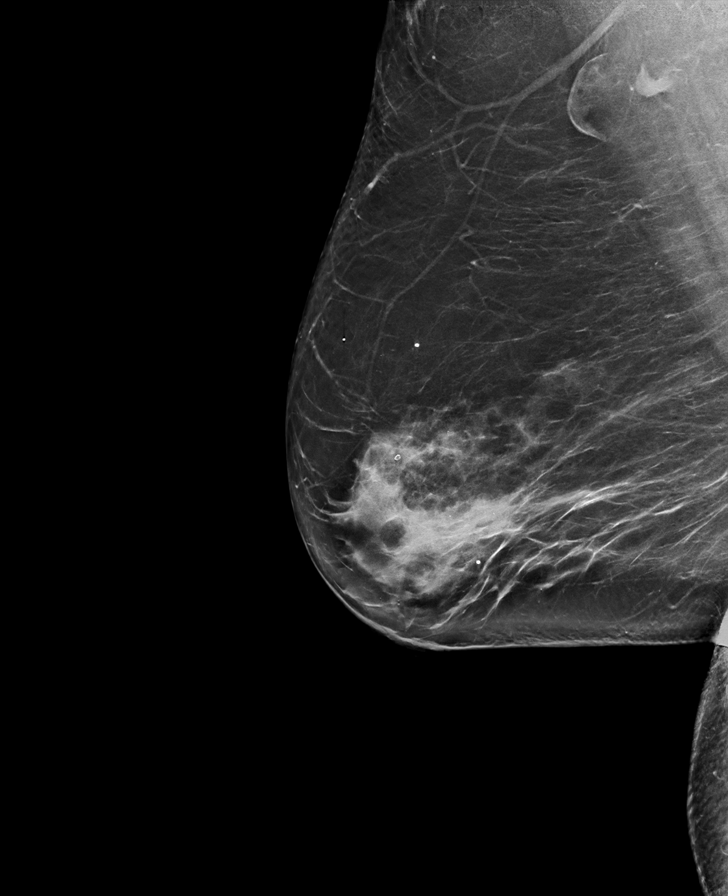

[L MLO synth-2D]
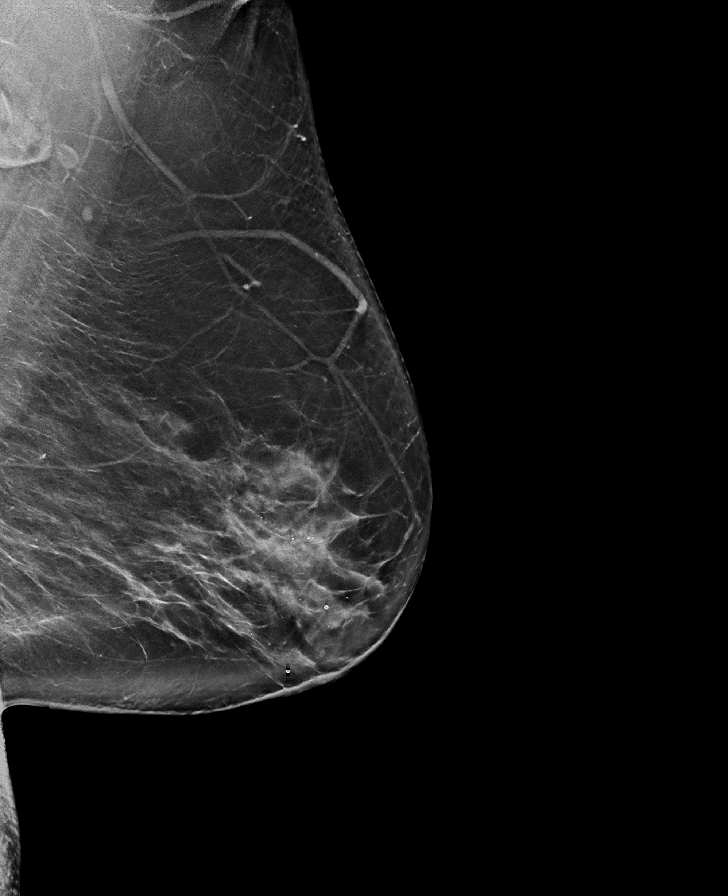

[R CC synth-2D]
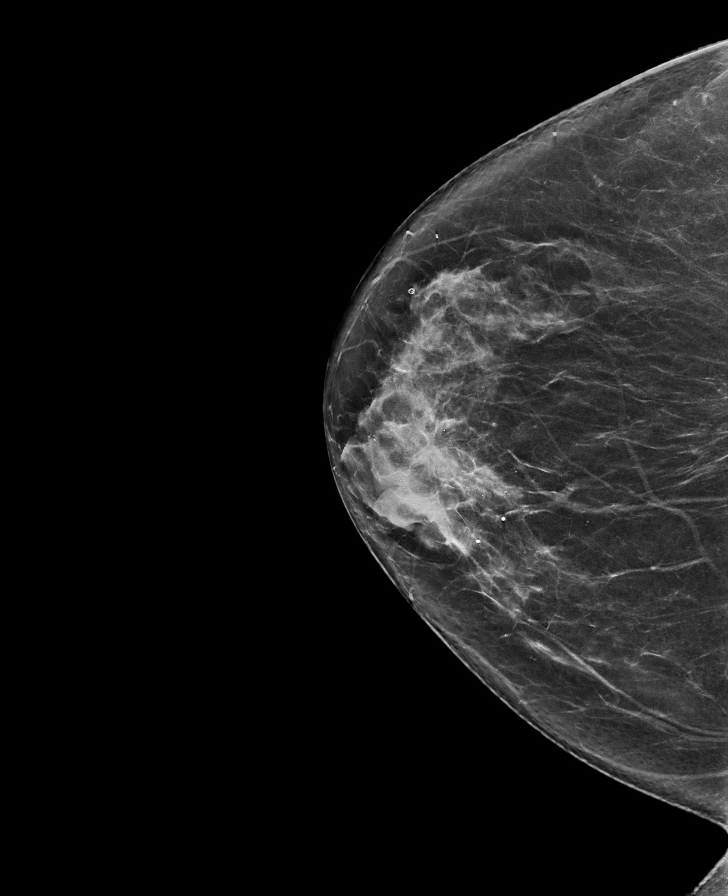

[L CC tomo · tomo slice 39/78.0]
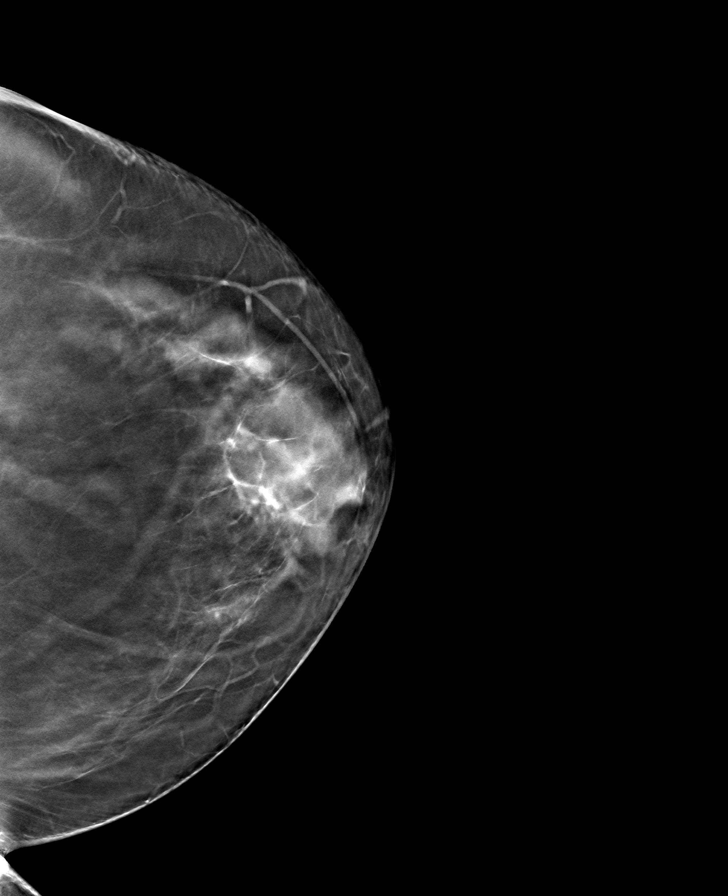

[R MLO tomo · tomo slice 45/88.0]
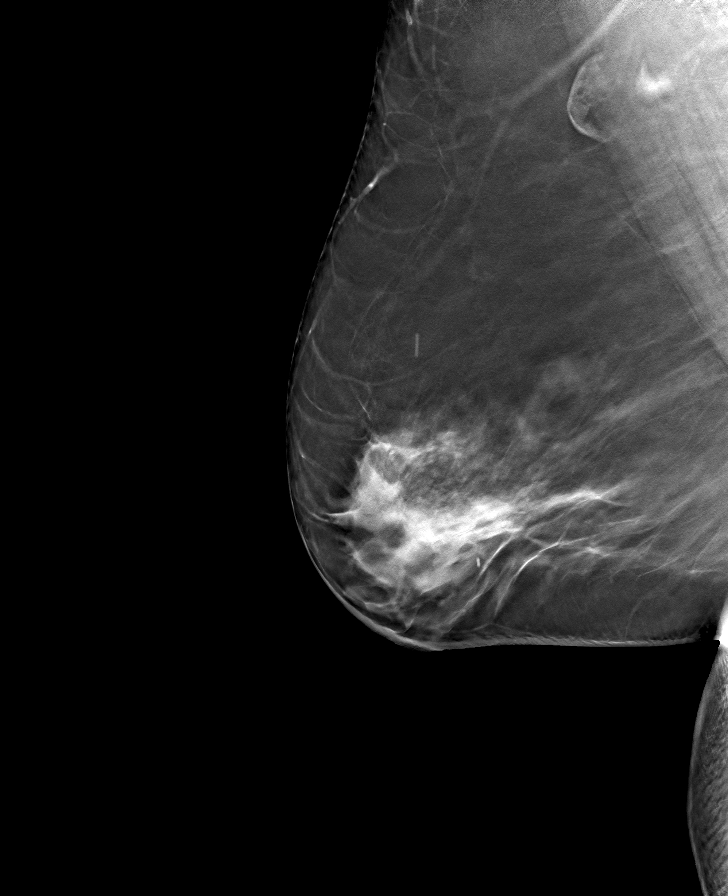

[R CC tomo · tomo slice 39/77.0]
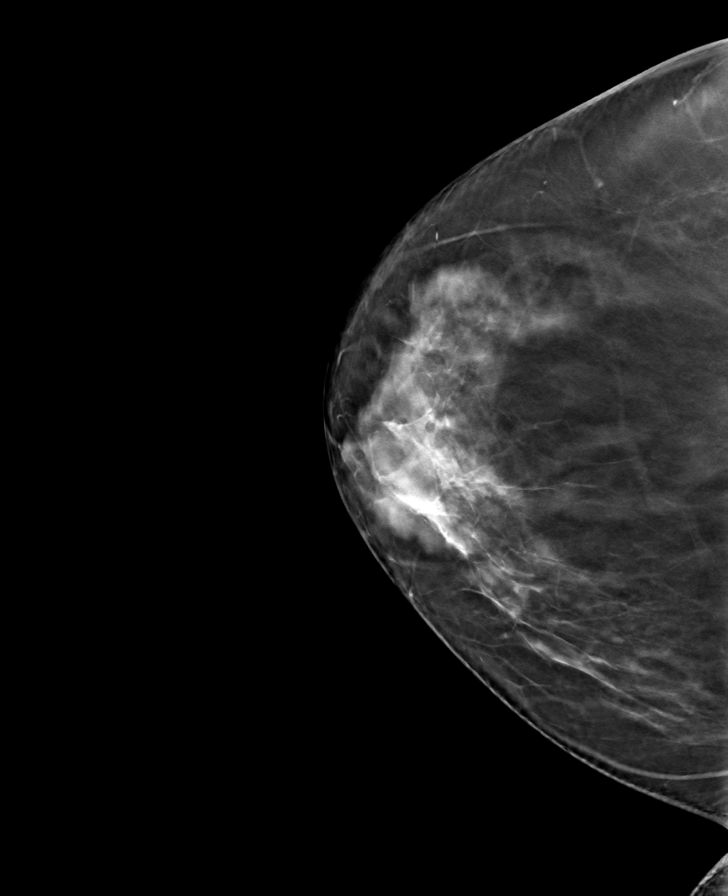

[L MLO tomo · tomo slice 46/91.0]
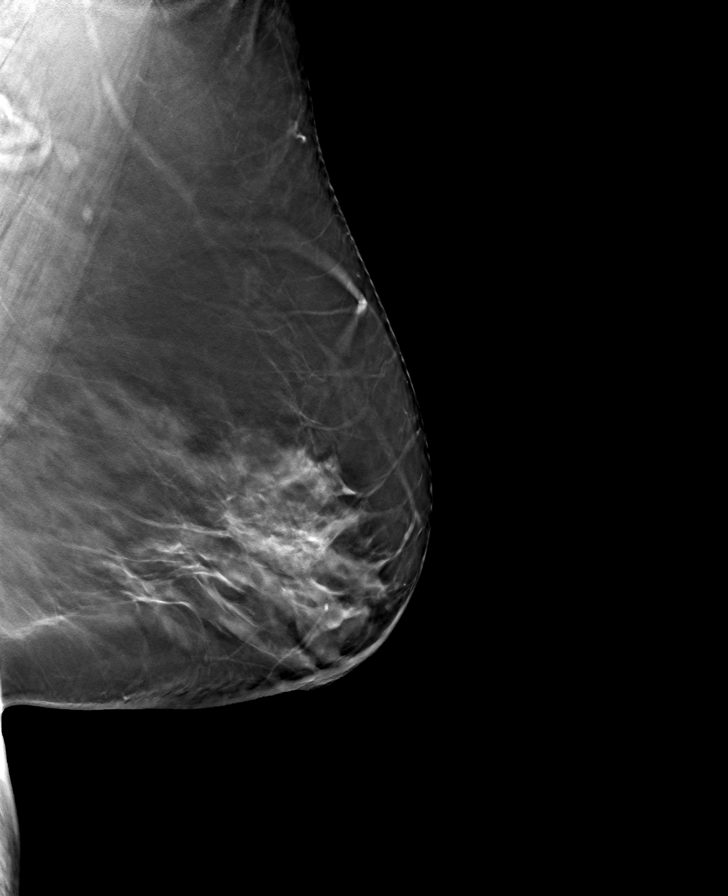

[8 of 24 positions shown; findings below may reference images not displayed]

ACR Breast Density Category c: The breast tissue is heterogeneously
dense, which may obscure small masses.
FINDINGS: In the right breast, a possible asymmetry warrants further
evaluation. In the left breast, no findings suspicious for
malignancy.
IMPRESSION: Further evaluation is suggested for possible asymmetry in the right
breast.

RECOMMENDATION:
Diagnostic mammogram and possibly ultrasound of the right breast.
(Code:[AN])

The patient will be contacted regarding the findings, and additional
imaging will be scheduled.

BI-RADS CATEGORY  0: Incomplete. Need additional imaging evaluation
and/or prior mammograms for comparison.

## 2022-03-26 ENCOUNTER — Other Ambulatory Visit: Payer: Self-pay | Admitting: Primary Care

## 2022-03-26 DIAGNOSIS — N6489 Other specified disorders of breast: Secondary | ICD-10-CM

## 2022-03-26 DIAGNOSIS — R928 Other abnormal and inconclusive findings on diagnostic imaging of breast: Secondary | ICD-10-CM

## 2022-04-15 ENCOUNTER — Ambulatory Visit (INDEPENDENT_AMBULATORY_CARE_PROVIDER_SITE_OTHER): Payer: Medicare HMO | Admitting: Psychologist

## 2022-04-15 DIAGNOSIS — F330 Major depressive disorder, recurrent, mild: Secondary | ICD-10-CM | POA: Diagnosis not present

## 2022-04-15 DIAGNOSIS — R69 Illness, unspecified: Secondary | ICD-10-CM | POA: Diagnosis not present

## 2022-04-15 NOTE — Progress Notes (Signed)
[place] Counselor/Therapist Progress Note ? ?Patient ID: Valerie Gordon, MRN: [id number],   ? ?Date: 04/15/2022 ? ?Time Spent: 3:05 pm to 3:44 pm; total time: 39 minutes ? ? This session was held via video webex teletherapy due to the coronavirus risk at this time. The patient consented to video teletherapy and was located at her home during this session. She is aware it is the responsibility of the patient to secure confidentiality on her end of the session. The provider was in a private home office for the duration of this session. Limits of confidentiality were discussed with the patient.  ? ?Treatment Type: Individual Therapy ? ?Reported Symptoms: Depressive symptoms ? ?Mental Status Exam: ?Appearance:  Casual     ?Behavior: Appropriate  ?Motor: Normal  ?Speech/Language:  Clear and Coherent  ?Affect: Appropriate  ?Mood: normal  ?Thought process: normal  ?Thought content:   WNL  ?Sensory/Perceptual disturbances:   WNL  ?Orientation: oriented to person, place, and time/date  ?Attention: Good  ?Concentration: Good  ?Memory: WNL  ?Fund of knowledge:  Good  ?Insight:   Fair  ?Judgment:  Fair  ?Impulse Control: Good  ? ?Risk Assessment: ?Danger to Self:  No ?Self-injurious Behavior: No ?Danger to Others: No ?Duty to Warn:no ?Physical Aggression / Violence:No  ?Access to Firearms a concern: No  ?Gang Involvement:No  ? ?Subjective: Beginning the session, patient stated that she has been doing well indicating that mindfulness has helped her. Elaborating, she voiced that since beginning therapy she feels as though she has improved. She reflected on what has assisted her. From there, she spent time reflecting on rumination of negative thoughts she experiences. After participating in defusion she described herself as doing better. She was agreeable to homework and following up. She denied suicidal and homicidal ideation.   ? ?Interventions:  Worked on developing a therapeutic relationship with the patient  using active listening and reflective statements. Provided emotional support using empathy and validation. Praised the patient for doing well and explored what has assisted the patient. Validated patient's experience. Explored goals for the session. Identified rumination of negative thoughts and normalized that process. Used metaphor to assist the patient gain insight into self. Challenged some of the thoughts expressed. Provided psychoeducation about defusion. Practiced and processed defusion. Praised patient for experiencing less distress. Normalized patient's experience. Assisted in problem solving. Provided empathic statements. Assigned homework. Assessed for suicidal and homicidal ideation.  ? ?Homework: Implement defusion ? ?Next Session: Review homework and emotional support  ? ?Diagnosis: F33.0 major depressive affective disorder, recurrent, mild  ? ?Plan:  ? ? ?Goals ?Alleviate depressive symptoms ?Recognize, accept, and cope with depressive feelings ?Develop healthy thinking patterns ?Develop healthy interpersonal relationships ? ?Objectives target date for all objectives is 01/21/2023 ?Cooperate with a medication evaluation by a physician ?Verbalize an accurate understanding of depression ?Verbalize an understanding of the treatment ?Identify and replace thoughts that support depression ?Learn and implement behavioral strategies ?Verbalize an understanding and resolution of current interpersonal problems ?Learn and implement problem solving and decision making skills ?Learn and implement conflict resolution skills to resolve interpersonal problems ?Verbalize an understanding of healthy and unhealthy emotions verbalize insight into how past relationships may be influence current experiences with depression ?Use mindfulness and acceptance strategies and increase value based behavior  ?Increase hopeful statements about the future.  ?Interventions ?Consistent with treatment model, discuss how change in  cognitive, behavioral, and interpersonal can help client alleviate depression ?CBT ?Behavioral activation help the client explore the relationship, nature of the  dispute,  ?Help the client develop new interpersonal skills and relationships ?Conduct Problem so living therapy ?Teach conflict resolution skills ?Use a process-experiential approach ?Conduct TLDP ?Conduct ACT ?Evaluate need for psychotropic medication ?Monitor adherence to medication  ? ?The patient and clinician reviewed the treatment plan on 02/16/2022. The patient approved of the treatment plan.  ? ?Conception Chancy, PsyD ? ?

## 2022-04-17 ENCOUNTER — Ambulatory Visit
Admission: RE | Admit: 2022-04-17 | Discharge: 2022-04-17 | Disposition: A | Discharge status: Home / Self Care | Payer: Medicare HMO | Source: Ambulatory Visit | Attending: Primary Care | Admitting: Primary Care

## 2022-04-17 ENCOUNTER — Ambulatory Visit: Payer: Medicare HMO | Admitting: Primary Care

## 2022-04-17 DIAGNOSIS — R928 Other abnormal and inconclusive findings on diagnostic imaging of breast: Secondary | ICD-10-CM

## 2022-04-17 DIAGNOSIS — N6489 Other specified disorders of breast: Secondary | ICD-10-CM | POA: Diagnosis not present

## 2022-04-17 DIAGNOSIS — N6311 Unspecified lump in the right breast, upper outer quadrant: Secondary | ICD-10-CM | POA: Diagnosis not present

## 2022-04-17 DIAGNOSIS — R922 Inconclusive mammogram: Secondary | ICD-10-CM | POA: Diagnosis not present

## 2022-04-17 IMAGING — US US BREAST*R* LIMITED INC AXILLA
1 series · 6 of 6 positions shown · non-contrast
Comparison: Previous exam(s).

CLINICAL DATA: Screening recall for a possible right breast
asymmetry/distortion.

EXAM:
DIGITAL DIAGNOSTIC UNILATERAL RIGHT MAMMOGRAM WITH TOMOSYNTHESIS AND
CAD; ULTRASOUND RIGHT BREAST LIMITED
TECHNIQUE: Right digital diagnostic mammography and breast tomosynthesis was
performed. The images were evaluated with computer-aided detection.;
Targeted ultrasound examination of the right breast was performed

[Series 1: us breast*right* limited inc axilla · 0.09mm/px · 6 of 6 slices shown]
[im 1/6]
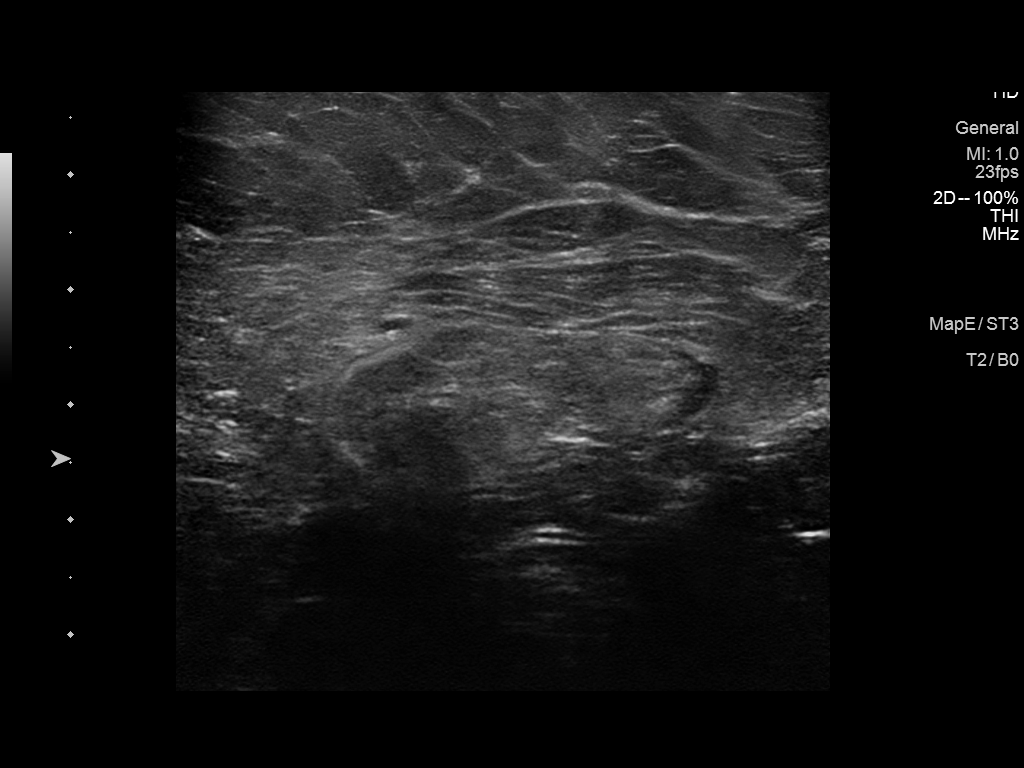
[im 2/6]
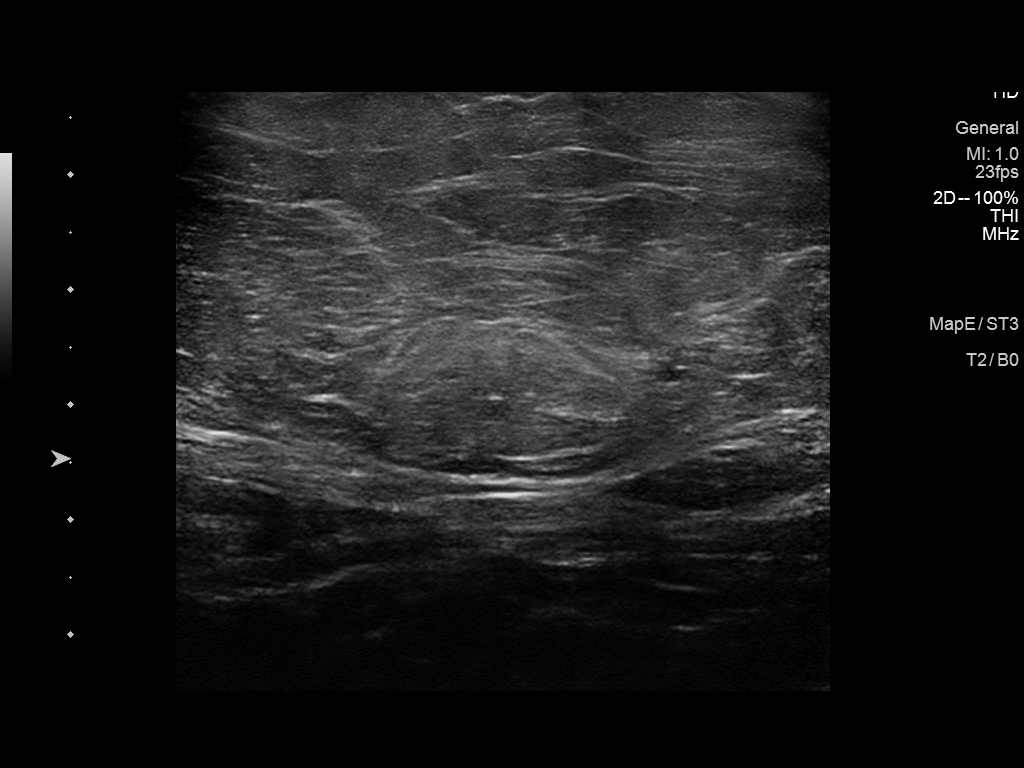
[im 3/6]
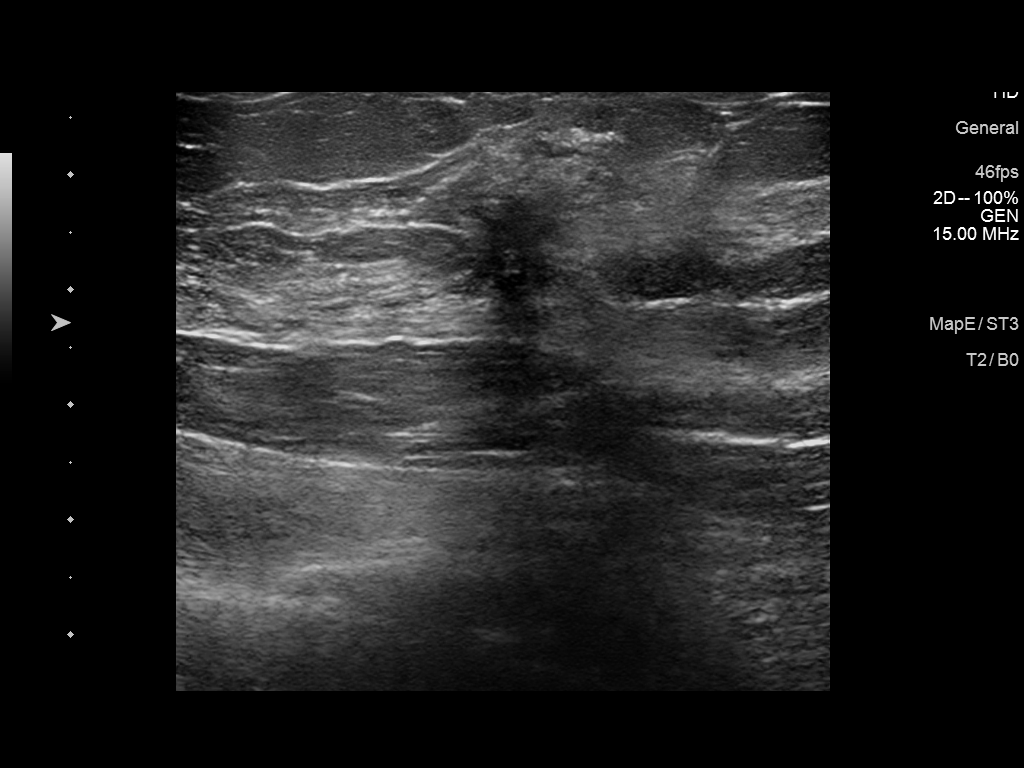
[im 4/6]
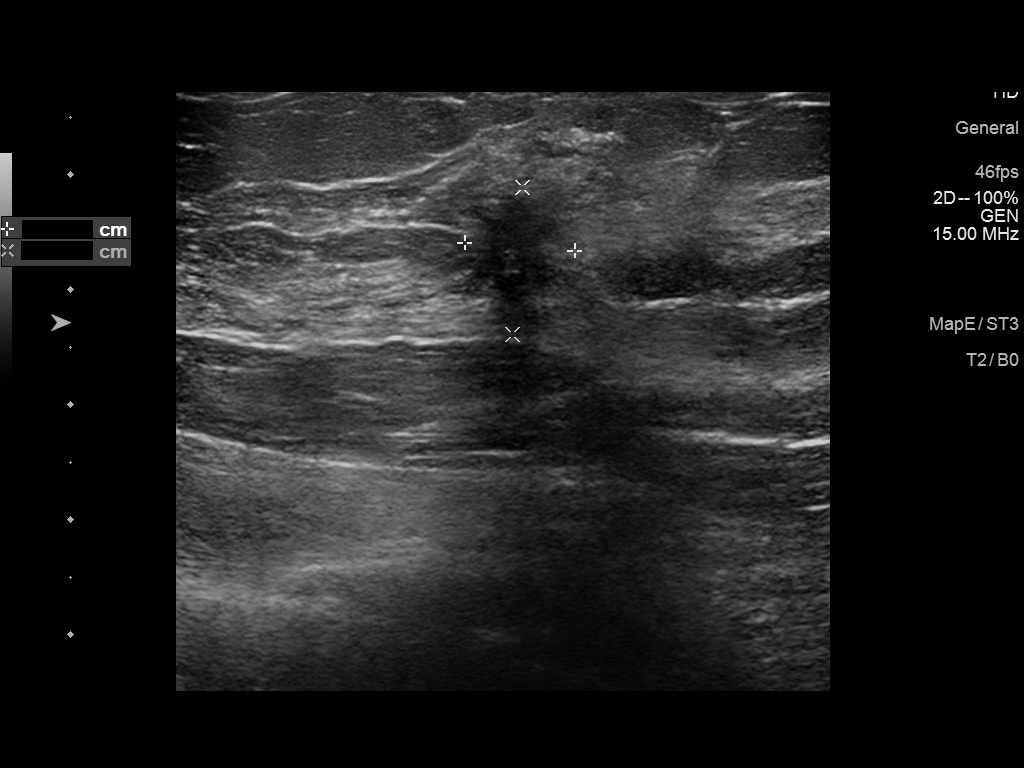
[im 5/6]
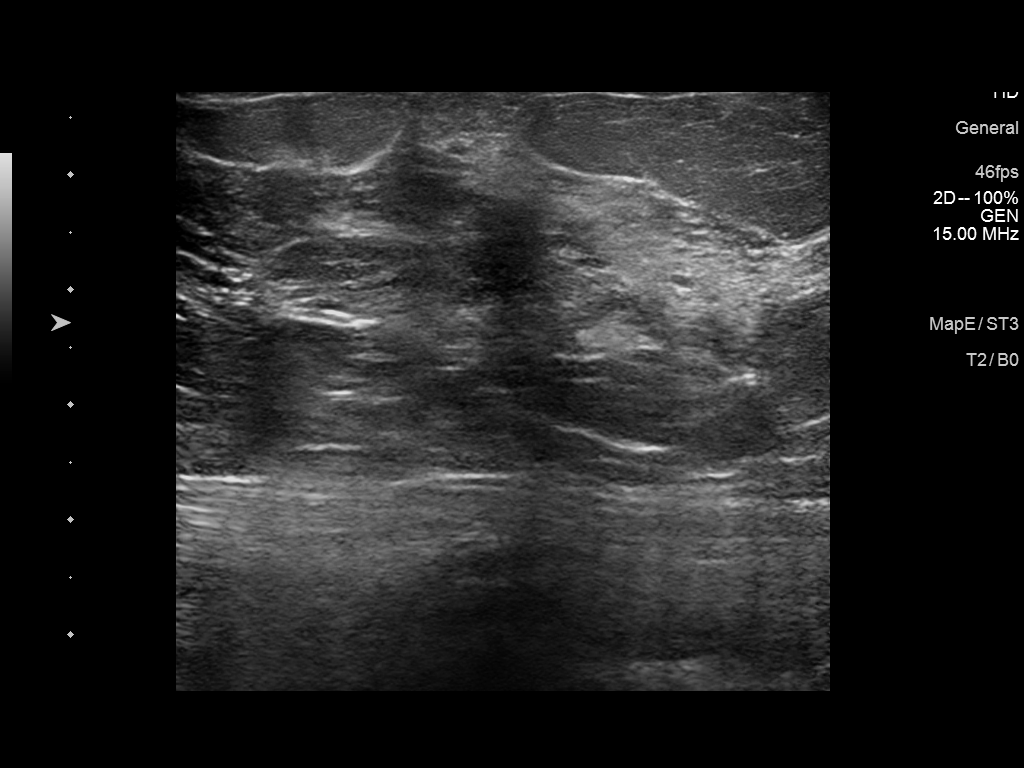
[im 6/6]
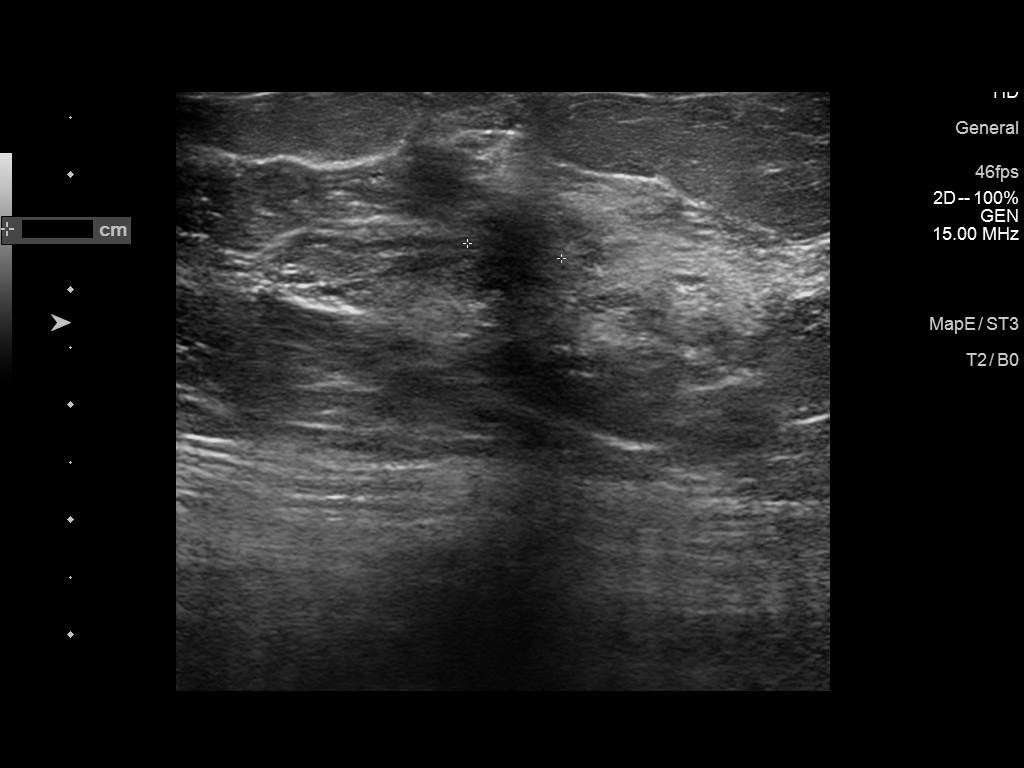

[6 of 6 positions shown; findings below may reference images not displayed]

ACR Breast Density Category c: The breast tissue is heterogeneously
dense, which may obscure small masses.
FINDINGS: On the diagnostic spot-compression images, the asymmetry suggested
in the retroareolar right breast on the current screening exam
persists as a focal architectural distortion with a suspected
central irregular mass. This lies in the upper outer quadrant,
anterior depth.

On physical exam, no defined mass is palpated in the upper outer
right breast.

Targeted ultrasound is performed, showing a hypoechoic irregular
mass with irregular margins and associated architectural distortion
in the right breast at 11 o'clock, 2 cm the nipple, measuring 1.3 x
0.8 x 1.0 cm, corresponding in size and location to the mammographic
abnormality.

Sonographic evaluation of the right axilla shows normal lymph nodes.
There are no enlarged or abnormal lymph nodes.
IMPRESSION: 1. 1.3 cm irregular mass with associated distortion in the right
breast at 11 o'clock, 2 cm the nipple, highly suspicious for
malignancy. No abnormal right axillary lymph nodes.

RECOMMENDATION:
1. Ultrasound-guided core needle biopsy of the right breast highly
suspicious mass.

I have discussed the findings and recommendations with the patient.
If applicable, a reminder letter will be sent to the patient
regarding the next appointment.

BI-RADS CATEGORY  5: Highly suggestive of malignancy.

## 2022-04-17 IMAGING — MG MM DIGITAL DIAGNOSTIC UNILAT*R* W/ TOMO W/ CAD
6 series · 6 of 18 positions shown · non-contrast
Comparison: Previous exam(s).

CLINICAL DATA: Screening recall for a possible right breast
asymmetry/distortion.

EXAM:
DIGITAL DIAGNOSTIC UNILATERAL RIGHT MAMMOGRAM WITH TOMOSYNTHESIS AND
CAD; ULTRASOUND RIGHT BREAST LIMITED
TECHNIQUE: Right digital diagnostic mammography and breast tomosynthesis was
performed. The images were evaluated with computer-aided detection.;
Targeted ultrasound examination of the right breast was performed

[R MLO synth-2D]
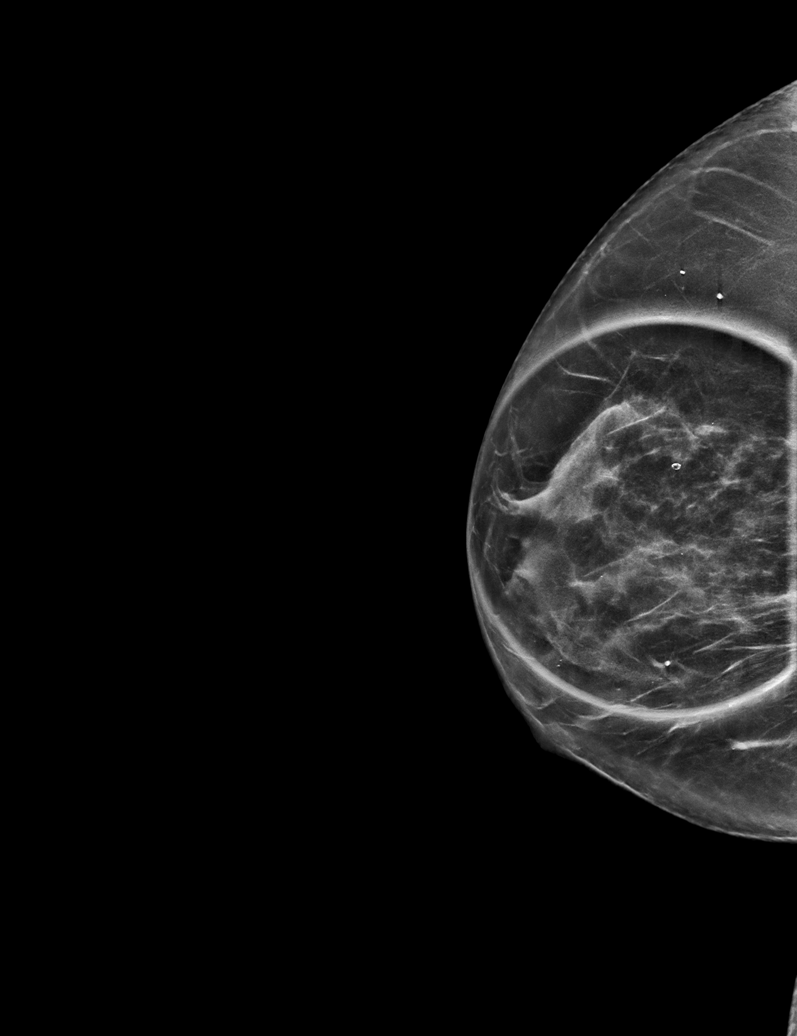

[R CC synth-2D]
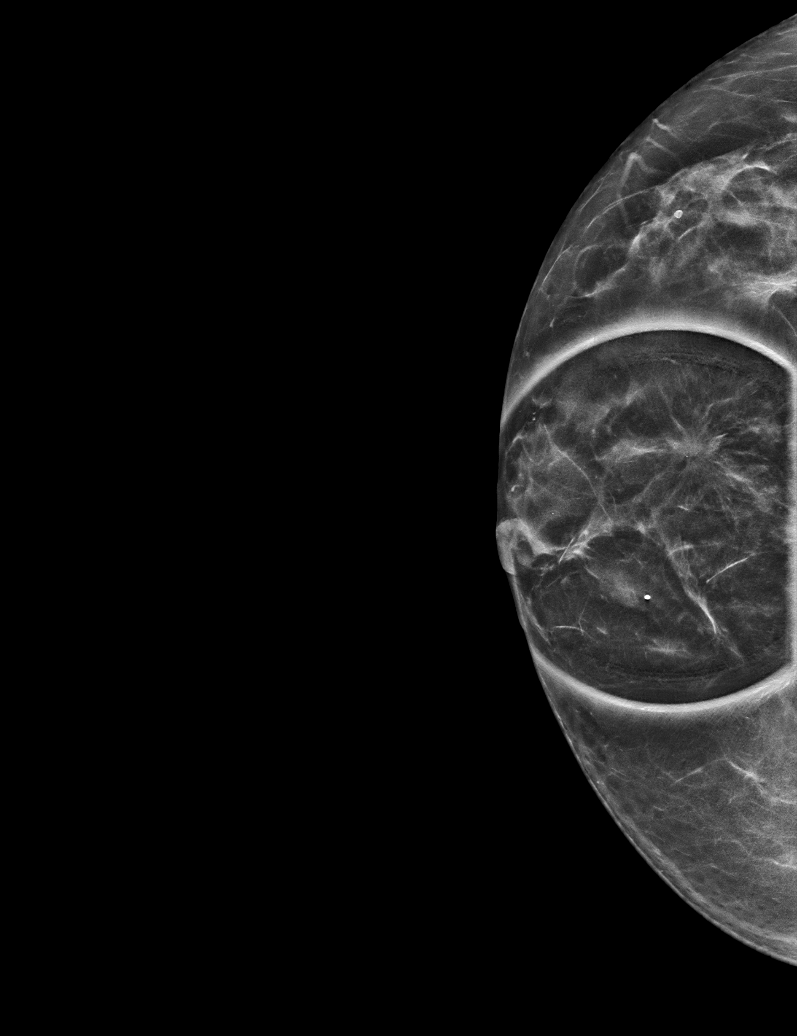

[R ML synth-2D]
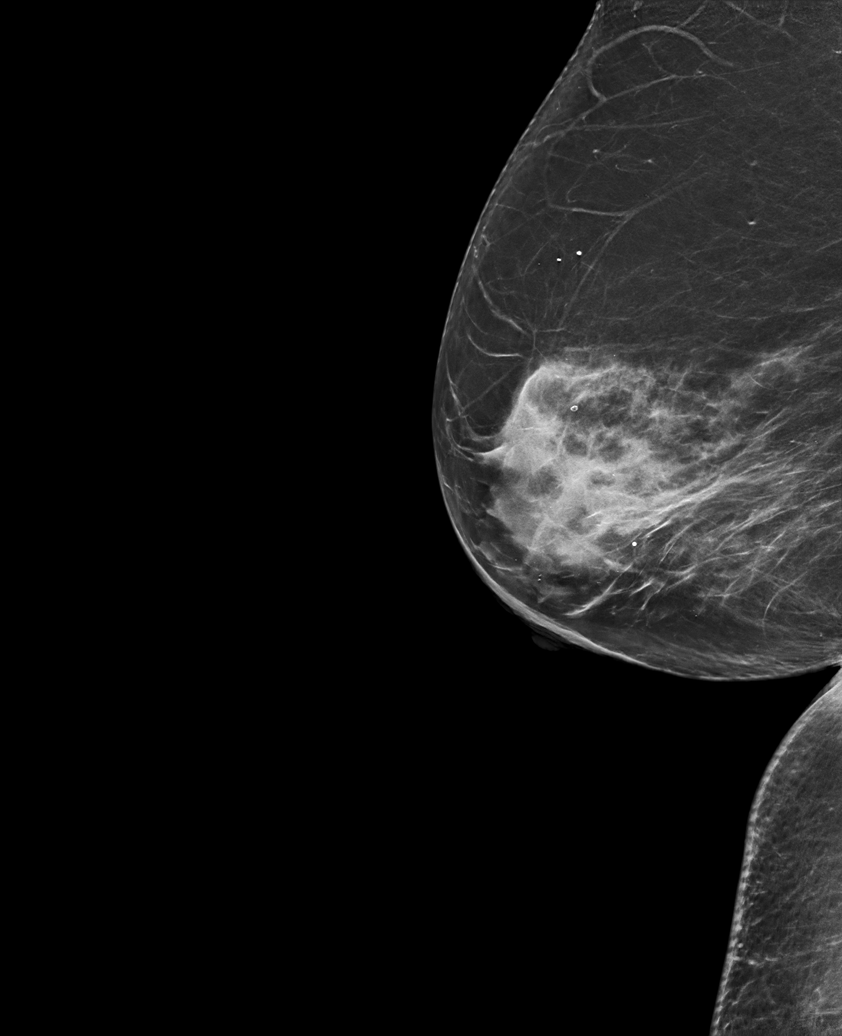

[R ML tomo · tomo slice 41/82.0]
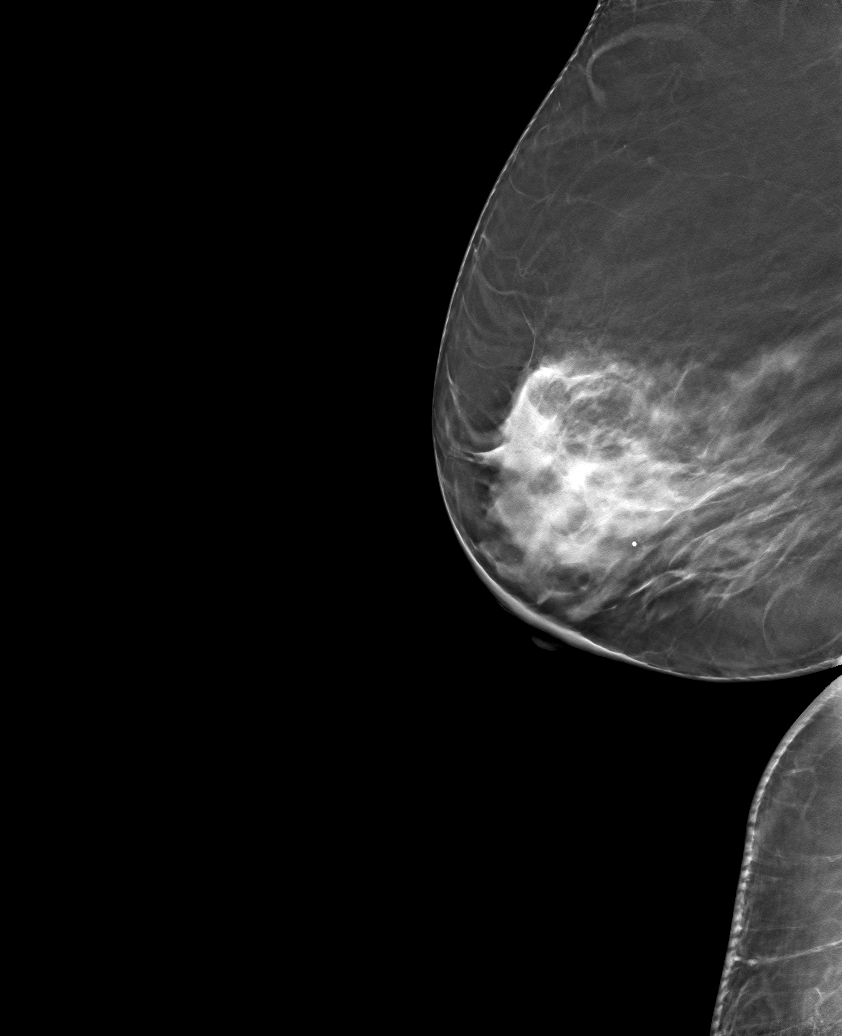

[R CC tomo · tomo slice 24/47.0]
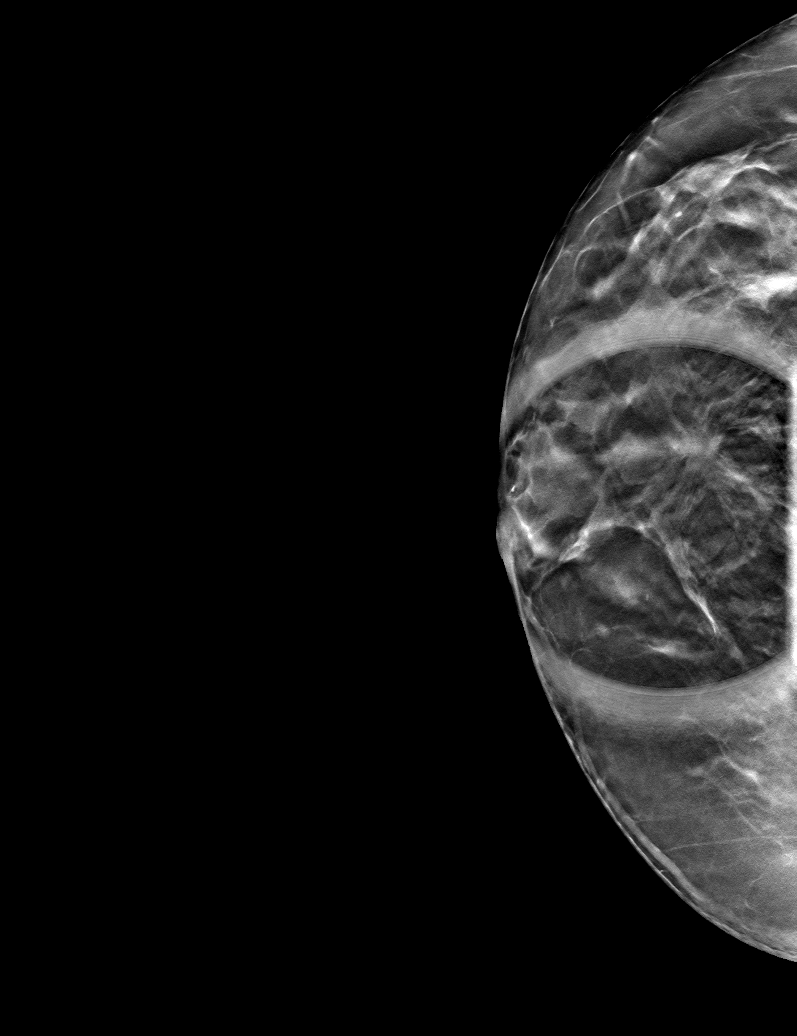

[R MLO tomo · tomo slice 33/66.0]
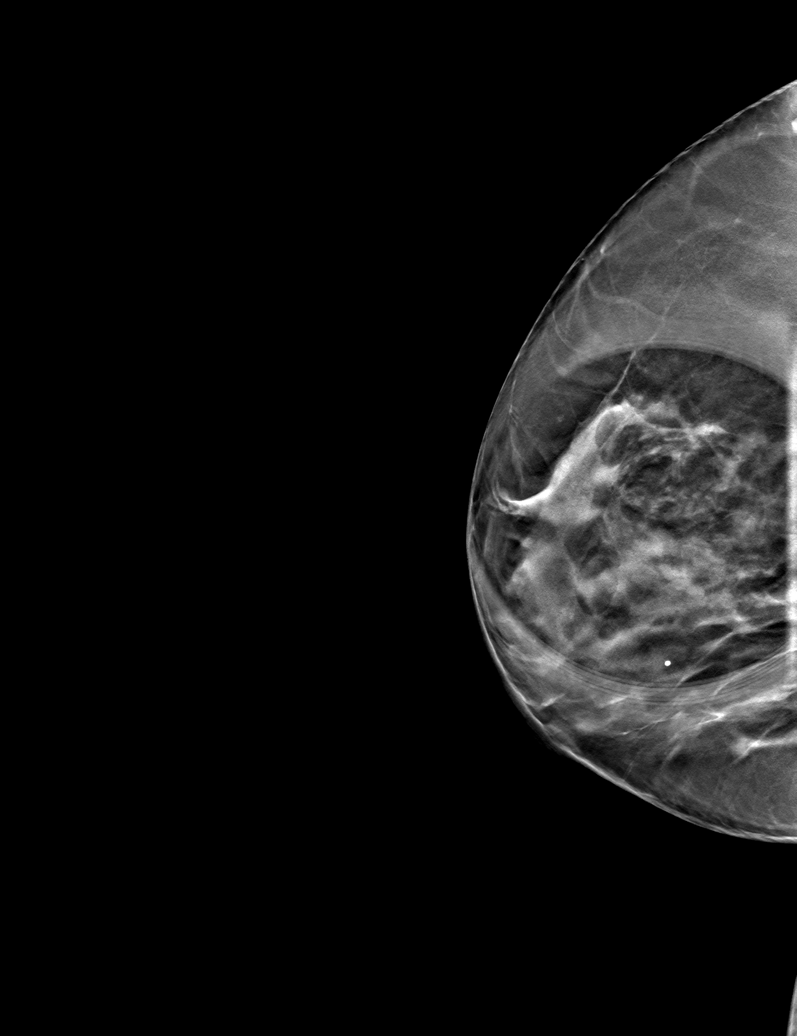

[6 of 18 positions shown; findings below may reference images not displayed]

ACR Breast Density Category c: The breast tissue is heterogeneously
dense, which may obscure small masses.
FINDINGS: On the diagnostic spot-compression images, the asymmetry suggested
in the retroareolar right breast on the current screening exam
persists as a focal architectural distortion with a suspected
central irregular mass. This lies in the upper outer quadrant,
anterior depth.

On physical exam, no defined mass is palpated in the upper outer
right breast.

Targeted ultrasound is performed, showing a hypoechoic irregular
mass with irregular margins and associated architectural distortion
in the right breast at 11 o'clock, 2 cm the nipple, measuring 1.3 x
0.8 x 1.0 cm, corresponding in size and location to the mammographic
abnormality.

Sonographic evaluation of the right axilla shows normal lymph nodes.
There are no enlarged or abnormal lymph nodes.
IMPRESSION: 1. 1.3 cm irregular mass with associated distortion in the right
breast at 11 o'clock, 2 cm the nipple, highly suspicious for
malignancy. No abnormal right axillary lymph nodes.

RECOMMENDATION:
1. Ultrasound-guided core needle biopsy of the right breast highly
suspicious mass.

I have discussed the findings and recommendations with the patient.
If applicable, a reminder letter will be sent to the patient
regarding the next appointment.

BI-RADS CATEGORY  5: Highly suggestive of malignancy.

## 2022-04-19 ENCOUNTER — Other Ambulatory Visit: Payer: Self-pay | Admitting: Primary Care

## 2022-04-19 DIAGNOSIS — E119 Type 2 diabetes mellitus without complications: Secondary | ICD-10-CM

## 2022-04-20 ENCOUNTER — Other Ambulatory Visit: Payer: Self-pay | Admitting: Primary Care

## 2022-04-20 DIAGNOSIS — R928 Other abnormal and inconclusive findings on diagnostic imaging of breast: Secondary | ICD-10-CM

## 2022-04-20 DIAGNOSIS — N63 Unspecified lump in unspecified breast: Secondary | ICD-10-CM

## 2022-04-28 ENCOUNTER — Ambulatory Visit
Admission: RE | Admit: 2022-04-28 | Discharge: 2022-04-28 | Disposition: A | Discharge status: Home / Self Care | Payer: Medicare HMO | Source: Ambulatory Visit | Attending: Primary Care | Admitting: Primary Care

## 2022-04-28 DIAGNOSIS — R928 Other abnormal and inconclusive findings on diagnostic imaging of breast: Secondary | ICD-10-CM

## 2022-04-28 DIAGNOSIS — N63 Unspecified lump in unspecified breast: Secondary | ICD-10-CM

## 2022-04-28 DIAGNOSIS — C50411 Malignant neoplasm of upper-outer quadrant of right female breast: Secondary | ICD-10-CM | POA: Diagnosis not present

## 2022-04-28 HISTORY — PX: BREAST BIOPSY: SHX20

## 2022-04-28 IMAGING — MG MM BREAST LOCALIZATION CLIP
4 series · 4 of 12 positions shown · non-contrast
Comparison: Previous exam(s).

CLINICAL DATA: Post biopsy mammogram of the right breast for clip
placement.

EXAM:
3D DIAGNOSTIC RIGHT MAMMOGRAM POST ULTRASOUND BIOPSY

[R CC synth-2D]
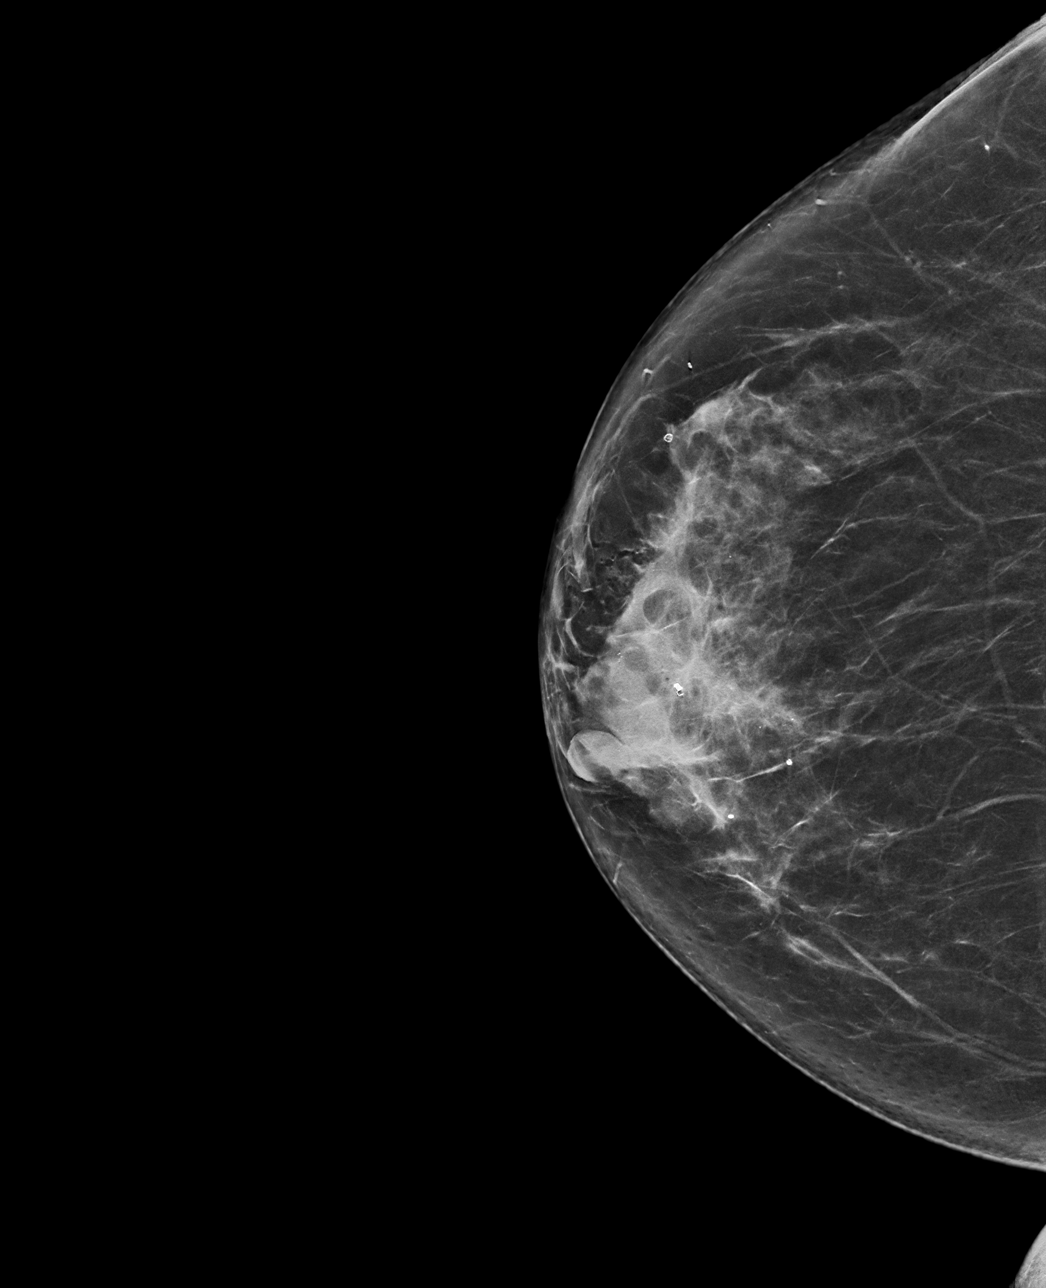

[R ML synth-2D]
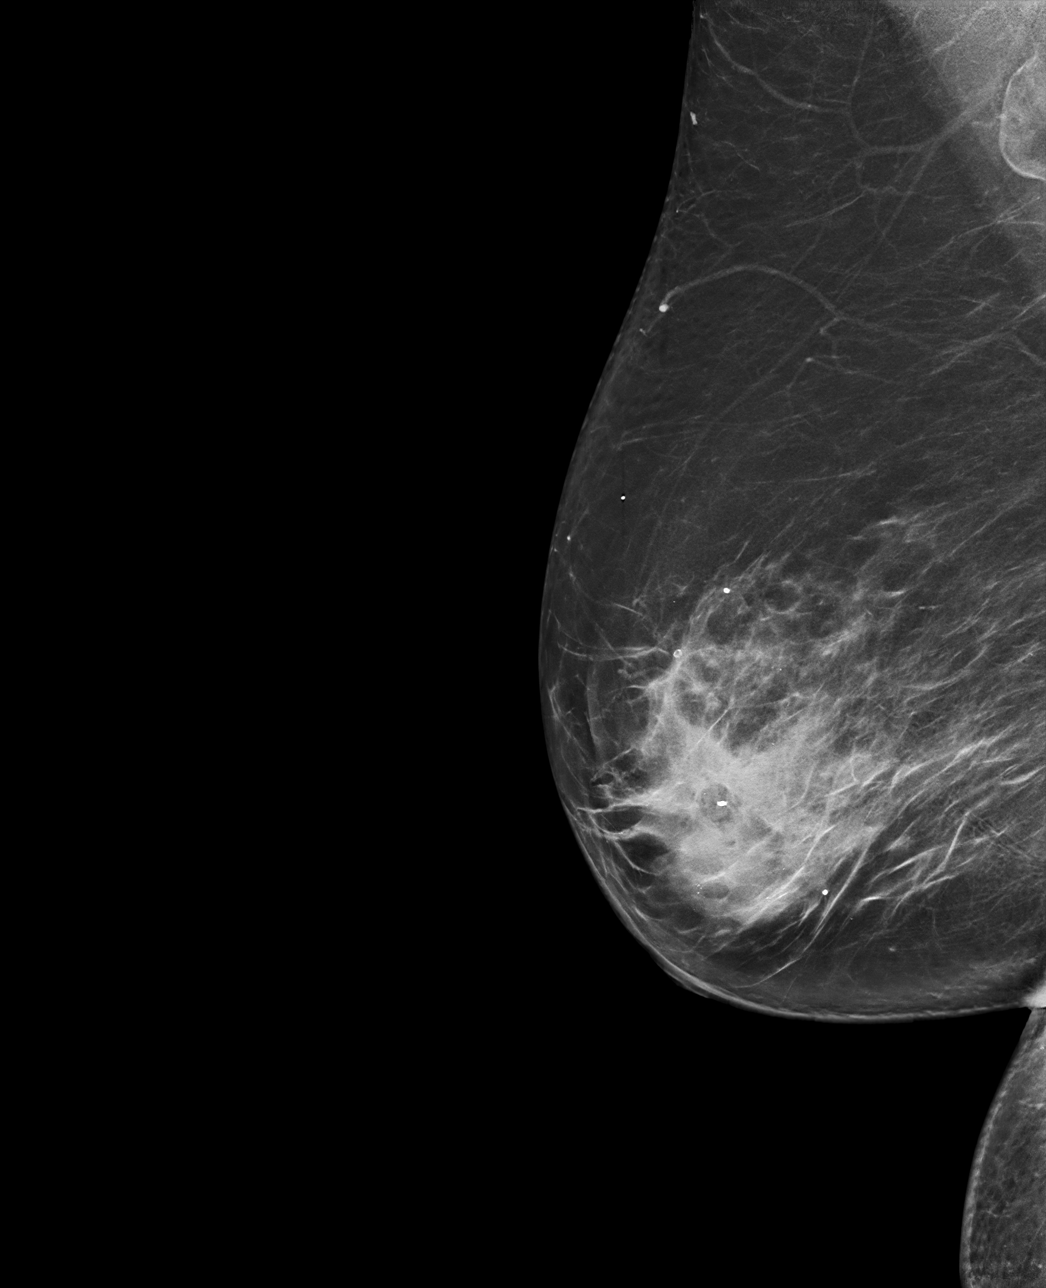

[R ML tomo · tomo slice 43/84.0]
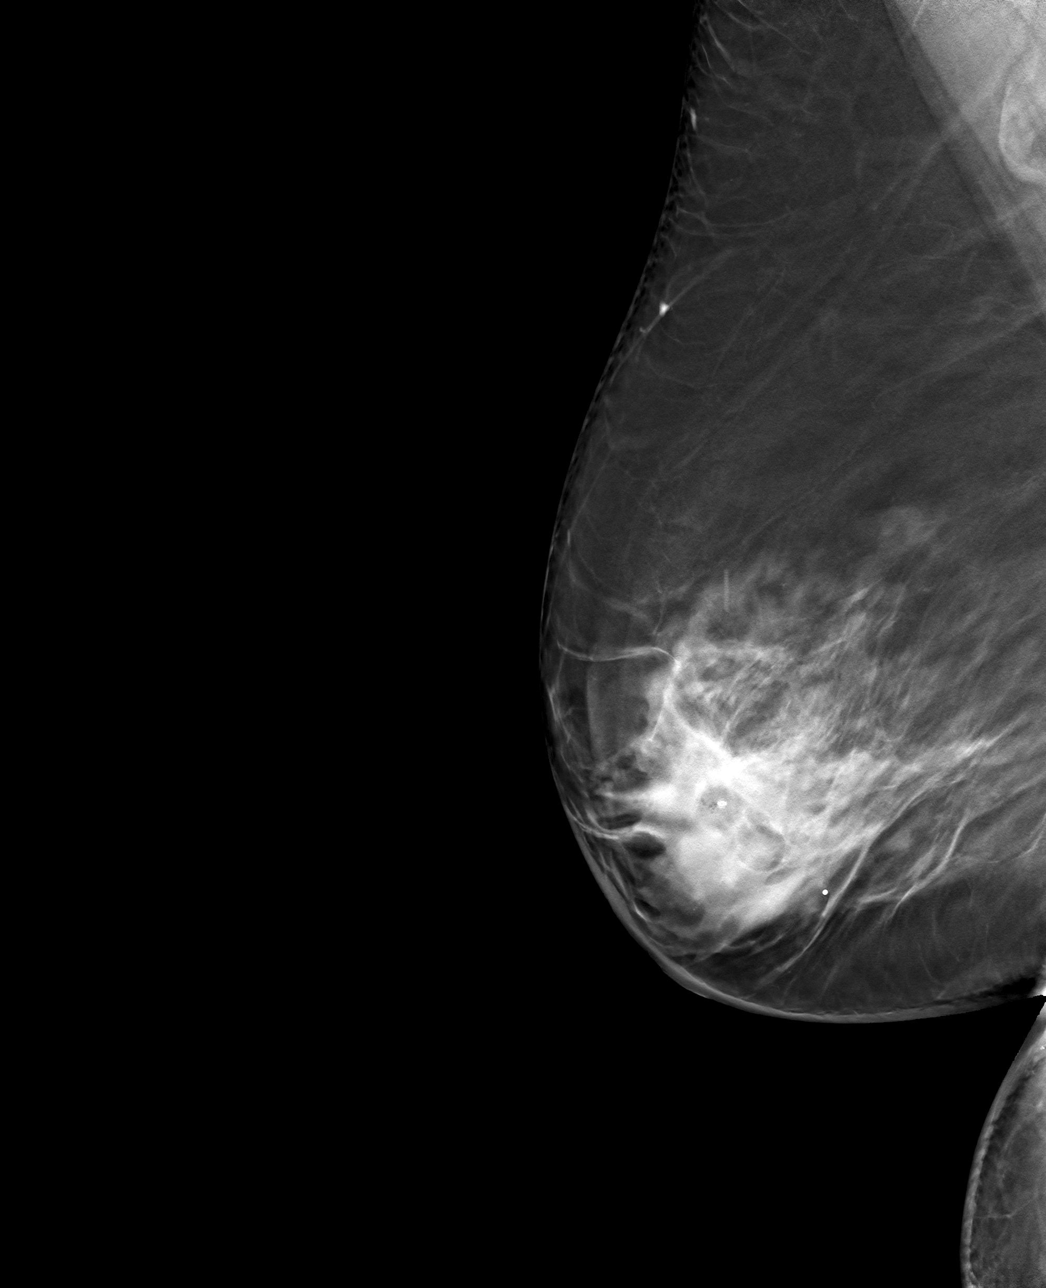

[R CC tomo · tomo slice 39/78.0]
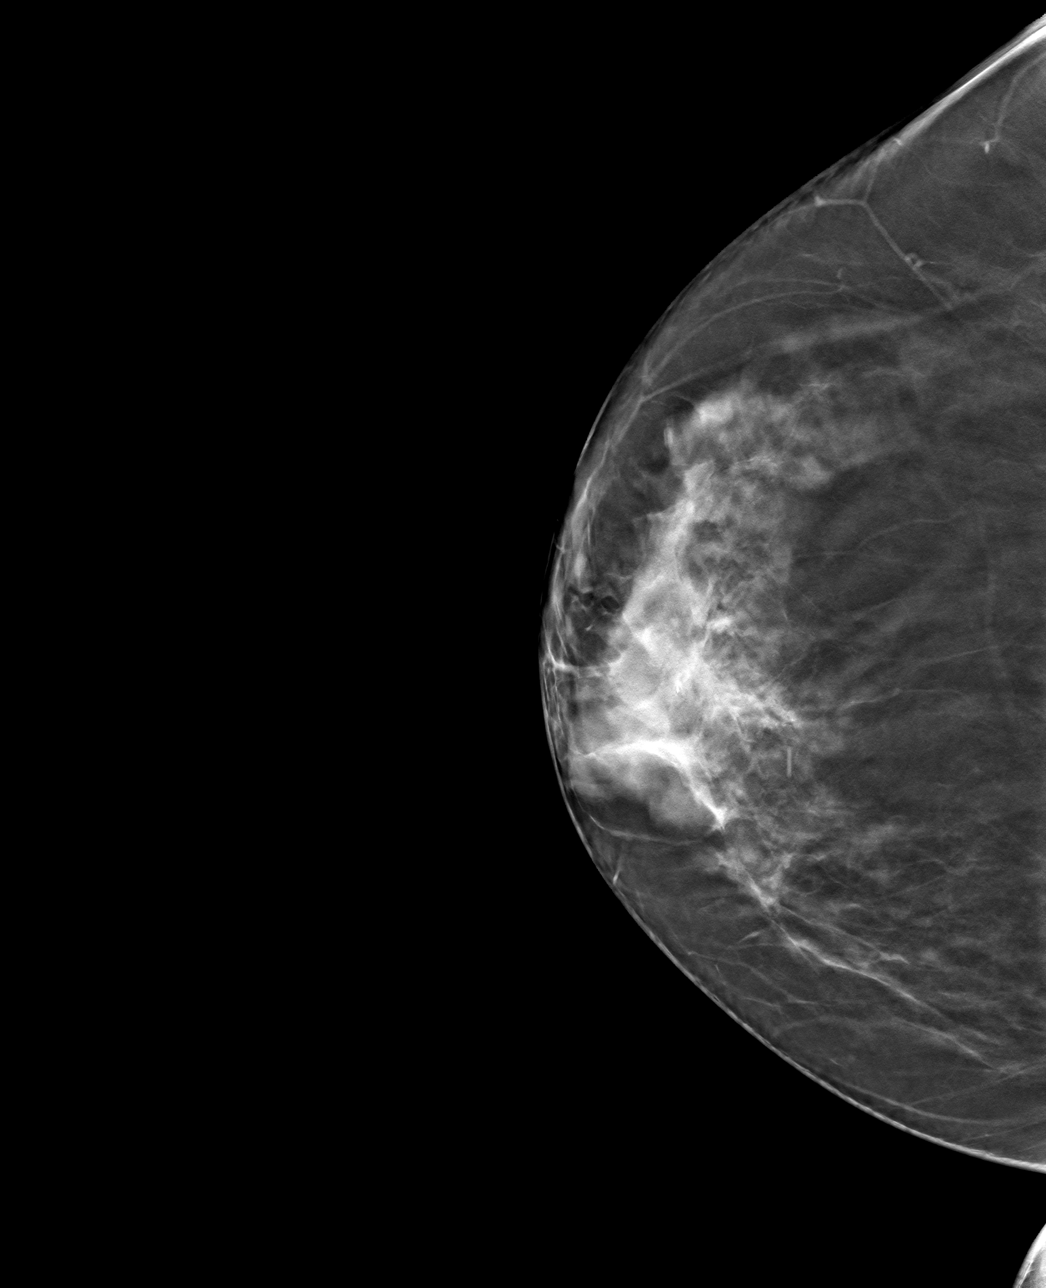

[4 of 12 positions shown; findings below may reference images not displayed]

FINDINGS: 3D Mammographic images were obtained following ultrasound guided
biopsy of a mass in the right breast at 11 o'clock. The biopsy
marking clip is in expected position at the site of biopsy.
IMPRESSION: Appropriate positioning of the coil shaped biopsy marking clip at
the site of biopsy in the upper-outer right breast.

Final Assessment: Post Procedure Mammograms for Marker Placement

## 2022-04-29 DIAGNOSIS — C50919 Malignant neoplasm of unspecified site of unspecified female breast: Secondary | ICD-10-CM

## 2022-04-30 DIAGNOSIS — Z170 Estrogen receptor positive status [ER+]: Secondary | ICD-10-CM | POA: Diagnosis not present

## 2022-04-30 DIAGNOSIS — C50411 Malignant neoplasm of upper-outer quadrant of right female breast: Secondary | ICD-10-CM | POA: Diagnosis not present

## 2022-04-30 LAB — SURGICAL PATHOLOGY

## 2022-04-30 NOTE — Progress Notes (Signed)
Received referral from [facility].  New diagnosis right breast cancer. NAvigation initiated. Sheduled with Dr. Bary Castilla, and Dr. Rogue Bussing. ?

## 2022-05-04 ENCOUNTER — Other Ambulatory Visit: Payer: Self-pay | Admitting: General Surgery

## 2022-05-04 DIAGNOSIS — C50411 Malignant neoplasm of upper-outer quadrant of right female breast: Secondary | ICD-10-CM

## 2022-05-06 ENCOUNTER — Ambulatory Visit (INDEPENDENT_AMBULATORY_CARE_PROVIDER_SITE_OTHER): Payer: Medicare HMO | Admitting: Primary Care

## 2022-05-06 ENCOUNTER — Encounter: Payer: Self-pay | Admitting: Primary Care

## 2022-05-06 ENCOUNTER — Inpatient Hospital Stay: Payer: Medicare HMO | Attending: Internal Medicine | Admitting: Internal Medicine

## 2022-05-06 ENCOUNTER — Encounter: Payer: Self-pay | Admitting: *Deleted

## 2022-05-06 ENCOUNTER — Other Ambulatory Visit: Payer: Medicare HMO

## 2022-05-06 ENCOUNTER — Encounter: Payer: Self-pay | Admitting: Internal Medicine

## 2022-05-06 ENCOUNTER — Inpatient Hospital Stay: Payer: Medicare HMO

## 2022-05-06 VITALS — BP 138/74 | HR 84 | Temp 98.2°F | Ht 64.0 in | Wt 211.0 lb

## 2022-05-06 DIAGNOSIS — R232 Flushing: Secondary | ICD-10-CM | POA: Insufficient documentation

## 2022-05-06 DIAGNOSIS — E119 Type 2 diabetes mellitus without complications: Secondary | ICD-10-CM | POA: Insufficient documentation

## 2022-05-06 DIAGNOSIS — Z801 Family history of malignant neoplasm of trachea, bronchus and lung: Secondary | ICD-10-CM | POA: Diagnosis not present

## 2022-05-06 DIAGNOSIS — Z79890 Hormone replacement therapy (postmenopausal): Secondary | ICD-10-CM | POA: Insufficient documentation

## 2022-05-06 DIAGNOSIS — E1165 Type 2 diabetes mellitus with hyperglycemia: Secondary | ICD-10-CM | POA: Diagnosis not present

## 2022-05-06 DIAGNOSIS — M8580 Other specified disorders of bone density and structure, unspecified site: Secondary | ICD-10-CM | POA: Insufficient documentation

## 2022-05-06 DIAGNOSIS — K219 Gastro-esophageal reflux disease without esophagitis: Secondary | ICD-10-CM | POA: Insufficient documentation

## 2022-05-06 DIAGNOSIS — Z8616 Personal history of COVID-19: Secondary | ICD-10-CM | POA: Diagnosis not present

## 2022-05-06 DIAGNOSIS — Z794 Long term (current) use of insulin: Secondary | ICD-10-CM | POA: Insufficient documentation

## 2022-05-06 DIAGNOSIS — Z79899 Other long term (current) drug therapy: Secondary | ICD-10-CM | POA: Insufficient documentation

## 2022-05-06 DIAGNOSIS — E079 Disorder of thyroid, unspecified: Secondary | ICD-10-CM | POA: Diagnosis not present

## 2022-05-06 DIAGNOSIS — C50411 Malignant neoplasm of upper-outer quadrant of right female breast: Secondary | ICD-10-CM | POA: Diagnosis not present

## 2022-05-06 DIAGNOSIS — Z7984 Long term (current) use of oral hypoglycemic drugs: Secondary | ICD-10-CM | POA: Diagnosis not present

## 2022-05-06 DIAGNOSIS — Z170 Estrogen receptor positive status [ER+]: Secondary | ICD-10-CM | POA: Insufficient documentation

## 2022-05-06 LAB — MICROALBUMIN / CREATININE URINE RATIO
Creatinine,U: 28.3 mg/dL
Microalb Creat Ratio: 2.5 mg/g (ref 0.0–30.0)
Microalb, Ur: <0.7 mg/dL (ref 0.0–1.9)

## 2022-05-06 LAB — POCT GLYCOSYLATED HEMOGLOBIN (HGB A1C): Hemoglobin A1C: 7.3 % — AB (ref 4.0–5.6)

## 2022-05-06 NOTE — Progress Notes (Signed)
? ?Subjective:  ? ? Patient ID: Valerie Gordon, female    DOB: 31-Dec-1950, 71 y.o.   MRN: [id number] ? ?HPI ? ?Valerie Gordon is a very pleasant 71 y.o. female with a history of type 2 diabetes, hypothyroidism, hyperlipidemia, newly diagnosed breast cancer who presents today for follow-up of diabetes. ? ?Current medications include: Metformin XR 1000 mg twice daily, glipizide 5 mg twice daily, Basaglar 32 units daily.  ? ?She is checking her blood glucose 1 times daily and is getting readings of: ? ?AM fasting: 140's on average. ? ?She has noticed 4-5 episodes of hypoglycemia in the afternoon around 30's-40's. On those occasions she's not eaten anything for lunch.  ? ?Last A1C: 8.5 in January 2023,  ?Last Eye Exam: UTD ?Last Foot Exam: UTD ?Pneumonia Vaccination: 2021 ?Urine Microalbumin: Due ?Statin: rosuvastatin  ? ?Dietary changes since last visit: None. Overall healthy diet, has recently had cravings for fast food.  ? ? ?Exercise: Some exercise on the elliptical.  ? ?BP Readings from Last 3 Encounters:  ?05/06/22 138/74  ?02/11/22 138/72  ?01/16/22 (!) 146/78  ? ? ? ? ? ?Review of Systems  ?Respiratory:  Negative for shortness of breath.   ?Cardiovascular:  Negative for chest pain.  ?Neurological:  Negative for dizziness and numbness.  ? ?   ? ? ?Past Medical History:  ?Diagnosis Date  ? Cataract   ? COVID-19 virus infection 10/14/2021  ? Depression   ? GERD (gastroesophageal reflux disease)   ? Glaucoma   ? Thyroid disease   ? Type 2 diabetes mellitus ([place])   ? manages with diet and exercise  ? ? ?Social History  ? ?Socioeconomic History  ? Marital status: Widowed  ?  Spouse name: Not on file  ? Number of children: Not on file  ? Years of education: Not on file  ? Highest education level: Not on file  ?Occupational History  ? Not on file  ?Tobacco Use  ? Smoking status: Never  ? Smokeless tobacco: Never  ?Vaping Use  ? Vaping Use: Never used  ?Substance and Sexual Activity  ? Alcohol use: Yes  ?  Comment:  occasional wine  ? Drug use: No  ? Sexual activity: Yes  ?Other Topics Concern  ? Not on file  ?Social History Narrative  ? Widower.  ? Step children.  ? Environmental health practitioner.  ? Enjoys riding hot air balloons, spending time with friends, reading, jewelry  ? ?Social Determinants of Health  ? ?Financial Resource Strain: Low Risk   ? Difficulty of Paying Living Expenses: Not hard at all  ?Food Insecurity: No Food Insecurity  ? Worried About Charity fundraiser in the Last Year: Never true  ? Ran Out of Food in the Last Year: Never true  ?Transportation Needs: No Transportation Needs  ? Lack of Transportation (Medical): No  ? Lack of Transportation (Non-Medical): No  ?Physical Activity: Sufficiently Active  ? Days of Exercise per Week: 4 days  ? Minutes of Exercise per Session: 40 min  ?Stress: No Stress Concern Present  ? Feeling of Stress : Not at all  ?Social Connections: Moderately Isolated  ? Frequency of Communication with Friends and Family: More than three times a week  ? Frequency of Social Gatherings with Friends and Family: Three times a week  ? Attends Religious Services: Never  ? Active Member of Clubs or Organizations: Yes  ? Attends Archivist Meetings: More than 4 times per year  ?  Marital Status: Widowed  ?Intimate Partner Violence: Not At Risk  ? Fear of Current or Ex-Partner: No  ? Emotionally Abused: No  ? Physically Abused: No  ? Sexually Abused: No  ? ? ?Past Surgical History:  ?Procedure Laterality Date  ? BREAST BIOPSY  12/28/2008  ? BREAST BIOPSY Right 04/28/2022  ? [place] core bx 11:00 coil clip - path pending  ? CARDIAC CATHETERIZATION  12/28/1996  ? COLONOSCOPY  10/30/2004  ? COLONOSCOPY WITH PROPOFOL N/A 04/01/2016  ? Procedure: COLONOSCOPY WITH PROPOFOL;  Surgeon: Robert Bellow, MD;  Location: [facility] END[facility];  Service: Endoscopy;  Laterality: N/A;  ? EYE SURGERY Bilateral   ? cataract sx  ? TONSILLECTOMY    ? ? ?Family History  ?Problem Relation Age of Onset  ? Stroke  Mother   ? Heart disease Father   ? Breast cancer Neg Hx   ? ? ?Allergies  ?Allergen Reactions  ? Antihistamines, Chlorpheniramine-Type   ? Ciprocin-Fluocin-Procin [Fluocinolone]   ? Trulicity [Dulaglutide]   ? ? ?Current Outpatient Medications on File Prior to Visit  ?Medication Sig Dispense Refill  ? b complex vitamins tablet Take 1 tablet by mouth daily.    ? benzonatate (TESSALON) 200 MG capsule Take 1 capsule (200 mg total) by mouth 3 (three) times daily as needed for cough. 15 capsule 0  ? BIOTIN PO Take 5 mg by mouth.    ? Cholecalciferol 50 MCG (2000 UT) CAPS Take by mouth.    ? famotidine (PEPCID) 20 MG tablet Take 20 mg by mouth at bedtime.    ? glipiZIDE (GLUCOTROL) 5 MG tablet Take 1 tablet (5 mg total) by mouth 2 (two) times daily before a meal. For diabetes. 180 tablet 3  ? Insulin Glargine (BASAGLAR KWIKPEN) 100 UNIT/ML Inject 32 Units into the skin daily. For diabetes. 30 mL 3  ? Insulin Pen Needle (B-D ULTRAFINE III SHORT PEN) 31G X 8 MM MISC USE WITH INSULIN NIGHTLY 100 each 0  ? Krill Oil 300 MG CAPS Take by mouth.    ? latanoprost (XALATAN) 0.005 % ophthalmic solution 1 drop at bedtime.    ? levothyroxine (SYNTHROID) 100 MCG tablet Take 1 tablet by mouth every morning on an empty stomach with water only.  No food or other medications for 30 minutes. 90 tablet 0  ? Magnesium 200 MG TABS Take by mouth.    ? Menaquinone-7 (VITAMIN K2 PO) Take 19 mcg by mouth.    ? metFORMIN (GLUCOPHAGE-XR) 500 MG 24 hr tablet TAKE 2 TABLETS BY MOUTH TWICE DAILY WITH A MEAL FOR DIABETES 360 tablet 0  ? rosuvastatin (CRESTOR) 10 MG tablet TAKE 1 TABLET BY MOUTH ONCE DAILY FOR CHOLESTEROL 90 tablet 3  ? VITAMIN A PO Take 2,400 mcg by mouth.    ? ?No current facility-administered medications on file prior to visit.  ? ? ?BP 138/74   Pulse 84   Temp 98.2 ?F (36.8 ?C) (Oral)   Ht '5\' 4"'$  (1.626 m)   Wt 211 lb (95.7 kg)   SpO2 96%   BMI 36.22 kg/m?  ?Objective:  ? Physical Exam ?Cardiovascular:  ?   Rate and Rhythm:  Normal rate and regular rhythm.  ?Pulmonary:  ?   Effort: Pulmonary effort is normal.  ?   Breath sounds: Normal breath sounds.  ?Musculoskeletal:  ?   Cervical back: Neck supple.  ?Skin: ?   General: Skin is warm and dry.  ? ? ? ? ? ?   ?Assessment & Plan:  ? ? ? ? ?  This visit occurred during the SARS-CoV-2 public health emergency.  Safety protocols were in place, including screening questions prior to the visit, additional usage of staff PPE, and extensive cleaning of exam room while observing appropriate contact time as indicated for disinfecting solutions.  ?

## 2022-05-06 NOTE — Progress Notes (Signed)
one Valerie Gordon ?CONSULT NOTE ? ?Patient Care Team: ?Pleas Koch, NP as PCP - General (Internal Medicine) ?Ammie Dalton, Okey Regal, MD as Consulting Physician (Obstetrics and Gynecology) ?Robert Bellow, MD (General Surgery) ?Kennith Center, RD as Dietitian (Family Medicine) ?Theodore Demark, RN as Oncology Nurse Navigator ? ?CHIEF COMPLAINTS/PURPOSE OF CONSULTATION: Breast cancer ? ?#  ?Oncology History Overview Note  ?IMPRESSION: ?1. 1.3 cm irregular mass with associated distortion in the right ?breast at 11 o'clock, 2 cm the nipple, highly suspicious for ?malignancy. No abnormal right axillary lymph nodes. ?  ?RECOMMENDATION: ?1. Ultrasound-guided core needle biopsy of the right breast highly ?suspicious mass. ?A.  BREAST MASS, RIGHT 11:00 2 CM FN; ULTRASOUND-GUIDED BIOPSY:  ?- INVASIVE MAMMARY CARCINOMA WITH LOBULAR FEATURES.  ? ?Size of invasive carcinoma: 8 mm in this sample  ?Histologic grade of invasive carcinoma: Grade 2  ?                     Glandular/tubular differentiation score: 3  ?                     Nuclear pleomorphism score: 2  ?                     Mitotic rate score: 1  ?                     Total score: 6  ?Ductal carcinoma in situ: Present, intermediate grade  ?Lymphovascular invasion: Not identified  ? ?ER/PR/HER2: Immunohistochemistry will be performed on block A2, with  ?reflex to [place] for HER2 2+. The results will be reported in an addendum.  ? ?Comment:  ?The definitive grade will be assigned on the excisional specimen.  ? ?GROSS DESCRIPTION:  ?A. Labeled: Right breast 11:00 2 cm from the nipple  ?Received: Formalin  ?Time/date in fixative: Collected and placed in formalin at 8:30 AM on  ?04/28/2022  ?Cold ischemic time: Less than 1 minute  ?Total fixation time: Approximately 9 hours  ?Core pieces: 4  ?Size: Ranges from 0.5-2.0 cm in length and 0.2 cm in diameter  ?Description: Tan cores of fibrofatty tissue  ?Ink color: Black  ?Entirely submitted in 2 cassettes with 2 cores  in A1 and 2 cores in A2.  ? ?CM 04/28/2022  ? ?Final Diagnosis performed by Quay Burow, MD.   Electronically signed  ?04/29/2022 1:04:41PM  ?The electronic signature indicates that the named Attending Pathologist  ?has evaluated the specimen  ?Technical component performed at [facility], [address], [place],  ?[place] [address] Lab: [phone] Dir: Rush Farmer, MD, MMM  ? Professional component performed at [facility], [facility], [place], [place], [place] [address] Lab: [phone]  ?Dir: Kathi Simpers, MD  ? ?ADDENDUM:  ?CASE SUMMARY: BREAST BIOMARKER TESTS  ?Estrogen Receptor (ER) Status: POSITIVE  ?        Percentage of cells with nuclear positivity: Greater than 90%  ?        Average intensity of staining: Strong  ? ?Progesterone Receptor (PgR) Status: POSITIVE  ?        Percentage of cells with nuclear positivity: Greater than 90%  ?        Average intensity of staining: Strong  ? ?HER2 (by immunohistochemistry): NEGATIVE (Score 0)  ?Ki-67: Not performed  ? ?#April 2023 clinical stage I -ER/PR positive HER2 =0 invasive mammary carcinoma with lobular features.  [Dr.Byrnett]  ?  ?  Carcinoma of upper-outer quadrant of right breast in female, estrogen receptor positive ([place])  ?05/06/2022 Initial Diagnosis  ? Carcinoma of upper-outer quadrant of right breast in female, estrogen receptor positive ([place]) ?  ?05/06/2022 Cancer Staging  ? Staging form: Breast, AJCC 8th Edition ?- Clinical: Stage IA (cT1c, cN0, cM0, G2, ER+, PR+, HER2-) - Signed by Cammie Sickle, MD on 05/06/2022 ?Histologic grading system: 3 grade system ? ?  ? ?HISTORY OF PRESENTING ILLNESS: Ambulating independently.  Accompanied by significant other. ? ?Valerie Gordon 71 y.o.  female female with no prior history of breast cancer/or malignancies has been referred to [place] for further evaluation recommendations for new diagnosis of breast cancer. ?  ?Patient states she was found to have an abnormal screening mammogram  which led to diagnostic mammogram/ultrasound/followed by biopsy-as summarized above. ? ?Patient did not have any previous breast biopsies.  ? ?Review of Systems  ?Constitutional:  Negative for chills, diaphoresis, fever, malaise/fatigue and weight loss.  ?HENT:  Negative for nosebleeds and sore throat.   ?Eyes:  Negative for double vision.  ?Respiratory:  Negative for cough, hemoptysis, sputum production, shortness of breath and wheezing.   ?Cardiovascular:  Negative for chest pain, palpitations, orthopnea and leg swelling.  ?Gastrointestinal:  Negative for abdominal pain, blood in stool, constipation, diarrhea, heartburn, melena, nausea and vomiting.  ?Genitourinary:  Negative for dysuria, frequency and urgency.  ?Musculoskeletal:  Negative for back pain and joint pain.  ?Skin: Negative.  Negative for itching and rash.  ?Neurological:  Negative for dizziness, tingling, focal weakness, weakness and headaches.  ?Endo/Heme/Allergies:  Does not bruise/bleed easily.  ?Psychiatric/Behavioral:  Negative for depression. The patient is not nervous/anxious and does not have insomnia.    ? ?MEDICAL HISTORY:  ?Past Medical History:  ?Diagnosis Date  ? Cataract   ? COVID-19 virus infection 10/14/2021  ? Depression   ? GERD (gastroesophageal reflux disease)   ? Glaucoma   ? Thyroid disease   ? Type 2 diabetes mellitus ([place])   ? manages with diet and exercise  ? ? ?SURGICAL HISTORY: ?Past Surgical History:  ?Procedure Laterality Date  ? BREAST BIOPSY  12/28/2008  ? BREAST BIOPSY Right 04/28/2022  ? [place] core bx 11:00 coil clip - path pending  ? CARDIAC CATHETERIZATION  12/28/1996  ? COLONOSCOPY  10/30/2004  ? COLONOSCOPY WITH PROPOFOL N/A 04/01/2016  ? Procedure: COLONOSCOPY WITH PROPOFOL;  Surgeon: Robert Bellow, MD;  Location: [facility] ENDOSCOPY;  Service: Endoscopy;  Laterality: N/A;  ? EYE SURGERY Bilateral   ? cataract sx  ? TONSILLECTOMY    ? ? ?SOCIAL HISTORY: ?Social History  ? ?Socioeconomic History  ? Marital status:  Widowed  ?  Spouse name: Not on file  ? Number of children: Not on file  ? Years of education: Not on file  ? Highest education level: Not on file  ?Occupational History  ? Not on file  ?Tobacco Use  ? Smoking status: Never  ? Smokeless tobacco: Never  ?Vaping Use  ? Vaping Use: Never used  ?Substance and Sexual Activity  ? Alcohol use: Yes  ?  Comment: occasional wine  ? Drug use: No  ? Sexual activity: Yes  ?Other Topics Concern  ? Not on file  ?Social History Narrative  ? Widower.  ? Step children.  ? Environmental health practitioner.  ? Enjoys riding hot air balloons, spending time with friends, reading, jewelry  ? ?Social Determinants of Health  ? ?Financial Resource Strain: Low Risk   ? Difficulty  of Paying Living Expenses: Not hard at all  ?Food Insecurity: No Food Insecurity  ? Worried About Charity fundraiser in the Last Year: Never true  ? Ran Out of Food in the Last Year: Never true  ?Transportation Needs: No Transportation Needs  ? Lack of Transportation (Medical): No  ? Lack of Transportation (Non-Medical): No  ?Physical Activity: Sufficiently Active  ? Days of Exercise per Week: 4 days  ? Minutes of Exercise per Session: 40 min  ?Stress: No Stress Concern Present  ? Feeling of Stress : Not at all  ?Social Connections: Moderately Isolated  ? Frequency of Communication with Friends and Family: More than three times a week  ? Frequency of Social Gatherings with Friends and Family: Three times a week  ? Attends Religious Services: Never  ? Active Member of Clubs or Organizations: Yes  ? Attends Archivist Meetings: More than 4 times per year  ? Marital Status: Widowed  ?Intimate Partner Violence: Not At Risk  ? Fear of Current or Ex-Partner: No  ? Emotionally Abused: No  ? Physically Abused: No  ? Sexually Abused: No  ? ? ?FAMILY HISTORY: ?Family History  ?Problem Relation Age of Onset  ? Stroke Mother   ? Heart disease Father   ? Lung cancer Maternal Grandmother   ? Stomach cancer Paternal  Grandmother   ? Breast cancer Neg Hx   ? ? ?ALLERGIES:  is allergic to antihistamines, chlorpheniramine-type; ciprocin-fluocin-procin [fluocinolone]; and trulicity [dulaglutide]. ? ?MEDICATIONS:  ?Current Outpatient

## 2022-05-06 NOTE — Assessment & Plan Note (Addendum)
#  Right breast cancer stage I PT1CN0-invasive mammary carcinoma with lobular features.  Awaiting MRI of the breast for further characterization. ? ?#  I had a long discussion with the patient in general regarding the treatment options of breast cancer including-surgery; adjuvant radiation; role of adjuvant systemic therapy including-chemotherapy antihormone therapy.   ? ?# Discussed surgical options-sentinel lymph node biopsy with lumpectomy versus mastectomy.  Discussed that lumpectomy is usually followed by adjuvant radiation.  Whereas mastectomy typically does not indicate radiation.  Patient has met with Dr. Bary Castilla; and is currently awaiting MRI breast to make surgical decisions. ? ?#Discussed that given patient's presurgical clinical/pathologic characteristics less likely that she will need chemotherapy.  However final decision regarding chemotherapy based on final surgical pathology. ? ?#I discussed the role of endocrine therapy-given ER/PR positive disease postsurgery.  Patient will be offered antihormone pill 1 a day for 5 years.  Discussed potential downsides including but not limited to arthralgias hot flashes and osteoporosis.  Patient had severe hot flashes after her natural menopause.  Discussed that we could offer symptomatic therapy if she does have similar side effects.  ? ?#Osteopenia: BMD March 2023- T-score of -1.9.  Monitor for now. ? ?Thank you for allowing me to participate in the care of your pleasant patient. Please do not hesitate to contact me with questions or concerns in the interim.  Discussed with Interior and spatial designer. ? ?# DISPOSITION: ?# No labs today ?# follow up in 1 month- MD; no labs-Dr.B ?

## 2022-05-06 NOTE — Patient Instructions (Addendum)
Continue to exercise regularly.  ?Continue to work on [employer]. ? ?Stop by the lab prior to leaving today. I will notify you of your results once received.  ? ?Please schedule a follow up visit for late September for a diabetes check. ? ?It was a pleasure to see you today! ?

## 2022-05-06 NOTE — Assessment & Plan Note (Addendum)
Improved with A1C of 7.3 today! ? ?Encouraged continued exercise and improvements in your diet.  ? ?We discussed how to prevent and combat hypoglycemic episodes. She would like to continue glipizide 5 mg BID as it has been effective.  ? ?Continue glipizide 5 mg BID, Metformin XR 1000 mg BID, Basaglar 32 units daily. ? ?Managed on statin. ?Pneumonia vaccine UTD. ?Urine micro due and pending. ? ?Follow up in 6 months.  ?

## 2022-05-15 ENCOUNTER — Ambulatory Visit
Admission: RE | Admit: 2022-05-15 | Discharge: 2022-05-15 | Disposition: A | Discharge status: Home / Self Care | Payer: Medicare HMO | Source: Ambulatory Visit | Attending: General Surgery | Admitting: General Surgery

## 2022-05-15 DIAGNOSIS — C50919 Malignant neoplasm of unspecified site of unspecified female breast: Secondary | ICD-10-CM | POA: Diagnosis not present

## 2022-05-15 DIAGNOSIS — Z170 Estrogen receptor positive status [ER+]: Secondary | ICD-10-CM | POA: Diagnosis not present

## 2022-05-15 DIAGNOSIS — N6320 Unspecified lump in the left breast, unspecified quadrant: Secondary | ICD-10-CM | POA: Diagnosis not present

## 2022-05-15 DIAGNOSIS — C50411 Malignant neoplasm of upper-outer quadrant of right female breast: Secondary | ICD-10-CM | POA: Diagnosis not present

## 2022-05-15 IMAGING — MR MR BREAST BILAT WO/W CM
2 of 13 series · 6 of 48 positions shown · IV contrast (Contrast agent)
Comparison: Multiple prior studies including [DATE]

CLINICAL DATA: Invasive breast cancer staging. Ultrasound-guided
core biopsy of mass at 11 o'clock 2 centimeters from the nipple of
the RIGHT breast was performed on [DATE] and demonstrated grade
2 invasive mammary carcinoma with lobular features and ductal
carcinoma in situ.

EXAM:
BILATERAL BREAST MRI WITH AND WITHOUT CONTRAST
TECHNIQUE: Multiplanar, multisequence MR images of both breasts were obtained
prior to and following the intravenous administration of ml of
Gadavist

[Series 2: T1 · axial · B · 1.5mm · 1.08mm/px · z∈[-82,+84]mm · 5 of 112 slices shown]
[im 1/112]
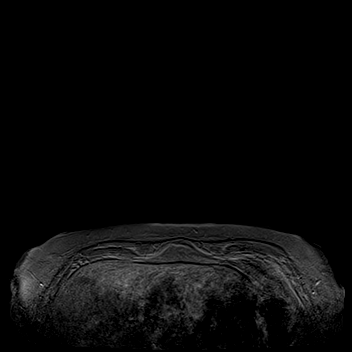
[im 28/112]
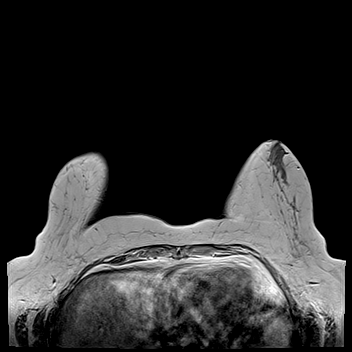
[im 56/112]
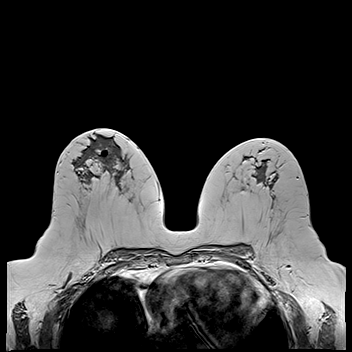
[im 84/112]
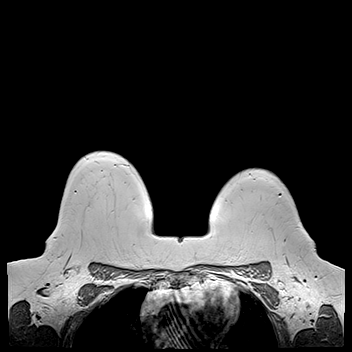
[im 112/112]
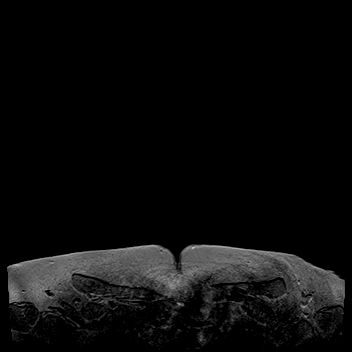

[Series 3: T2 · axial · B · 3.0mm · 1.08mm/px · 1 of 46 slices shown]
[im 1/46]
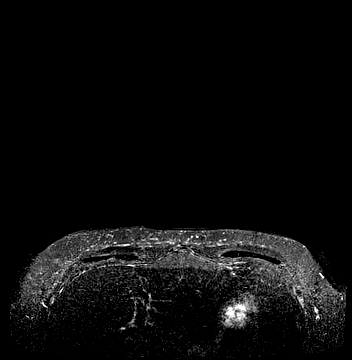

[6 of 48 positions shown; findings below may reference images not displayed]

Three-dimensional MR images were rendered by post-processing of the
original MR data on an independent workstation. The
three-dimensional MR images were interpreted, and findings are
reported in the following complete MRI report for this study. Three
dimensional images were evaluated at the independent interpreting
workstation using the DynaCAD thin client.
FINDINGS: Breast composition: c. Heterogeneous fibroglandular tissue.

Background parenchymal enhancement: Moderate.

Right breast: Tissue marker clip is identified in the anterior UPPER
OUTER QUADRANT of the RIGHT breast, at the site of recently
diagnosed grade 2 invasive mammary carcinoma with lobular features
and ductal carcinoma in situ. In this region, there is no
significant enhancement above background. However, within the
retroareolar region of the RIGHT breast and extending to the biopsy
site, there is a branching ductal system which is hyperintense on
noncontrast T1 weighted images. The appearance is asymmetric
compared to the LEFT breast. The extent of this ductal system is
x 1.7 x 2.2 centimeters, extending to the nipple anteriorly and
posteriorly to the site of the biopsy in the anterior UPPER OUTER
QUADRANT. (Series 5, images 39-50). No other areas of concern in the
RIGHT breast.

Left breast: Within the UPPER-OUTER QUADRANT at middle depth in the
LEFT breast, there is an oval enhancing mass with irregular margins
(image 42 of series 10). Mass measures 1.5 x 0.7 centimeters and
demonstrates plateau kinetics. There is no noncontrast correlate for
this abnormality. No other areas of concern in the LEFT breast.

Lymph nodes: No abnormal appearing lymph nodes. There are 2 benign
adjacent 4 millimeter lymph nodes at the anterior aspect of the
RIGHT hemidiaphragm.

Ancillary findings:  None.
IMPRESSION: 1. Post biopsy changes in the anterior UPPER OUTER QUADRANT of the
RIGHT breast. There is no significant enhancement above background
in this region. However, a ductal system with T1 hyperintense signal
extends from the nipple to the biopsied lesion and may define extent
of disease, measuring 3.4 x 1.7 x 2.2 centimeters.
1. Indeterminate oval 1.5 centimeter mass in the UPPER-OUTER
QUADRANT LEFT breast warranting tissue diagnosis. Definitive
sonographic correlate is unlikely given the lack of noncontrast
correlate for this mass.

RECOMMENDATION:
1. If breast conservation is being considered for the RIGHT breast,
consider MR directed core biopsy of the non-enhanced T1 hyperintense
ducts in the retroareolar region of the RIGHT breast.
2. MR guided core biopsy of LEFT breast mass.

BI-RADS CATEGORY  4: Suspicious.

## 2022-05-15 MED ORDER — GADOBUTROL 1 MMOL/ML IV SOLN
10.0000 mL | Freq: Once | INTRAVENOUS | Status: AC | PRN
  Administered 2022-05-15: 10 mL via INTRAVENOUS

## 2022-05-18 ENCOUNTER — Other Ambulatory Visit: Payer: Self-pay | Admitting: General Surgery

## 2022-05-18 DIAGNOSIS — R928 Other abnormal and inconclusive findings on diagnostic imaging of breast: Secondary | ICD-10-CM

## 2022-05-22 ENCOUNTER — Ambulatory Visit
Admission: RE | Admit: 2022-05-22 | Discharge: 2022-05-22 | Disposition: A | Discharge status: Home / Self Care | Payer: Medicare HMO | Source: Ambulatory Visit | Attending: General Surgery | Admitting: General Surgery

## 2022-05-22 DIAGNOSIS — N6321 Unspecified lump in the left breast, upper outer quadrant: Secondary | ICD-10-CM | POA: Diagnosis not present

## 2022-05-22 DIAGNOSIS — R928 Other abnormal and inconclusive findings on diagnostic imaging of breast: Secondary | ICD-10-CM

## 2022-05-22 DIAGNOSIS — D0512 Intraductal carcinoma in situ of left breast: Secondary | ICD-10-CM | POA: Diagnosis not present

## 2022-05-22 HISTORY — PX: BREAST BIOPSY: SHX20

## 2022-05-22 IMAGING — MG MM BREAST LOCALIZATION CLIP
4 series · 4 of 12 positions shown · non-contrast
Comparison: Previous exam(s).

CLINICAL DATA: Patient is post MRI core needle biopsy of a
indeterminate 1.5 cm mass over the middle third of the left upper
outer quadrant. Recent diagnosis right breast cancer.

EXAM:
3D DIAGNOSTIC LEFT MAMMOGRAM POST MRI BIOPSY.

[L CC synth-2D]
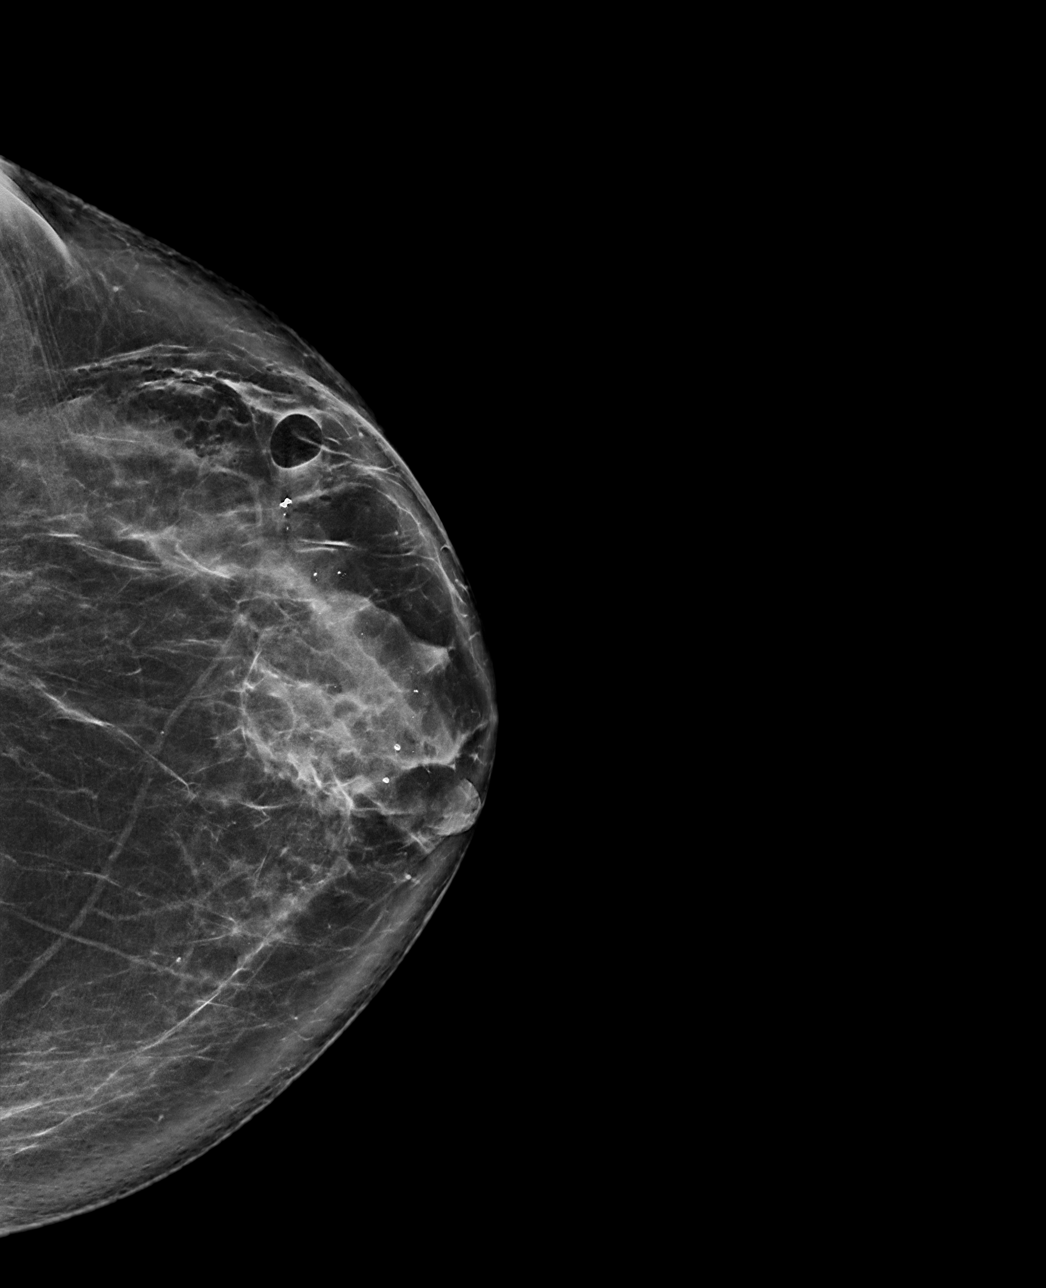

[L ML synth-2D]
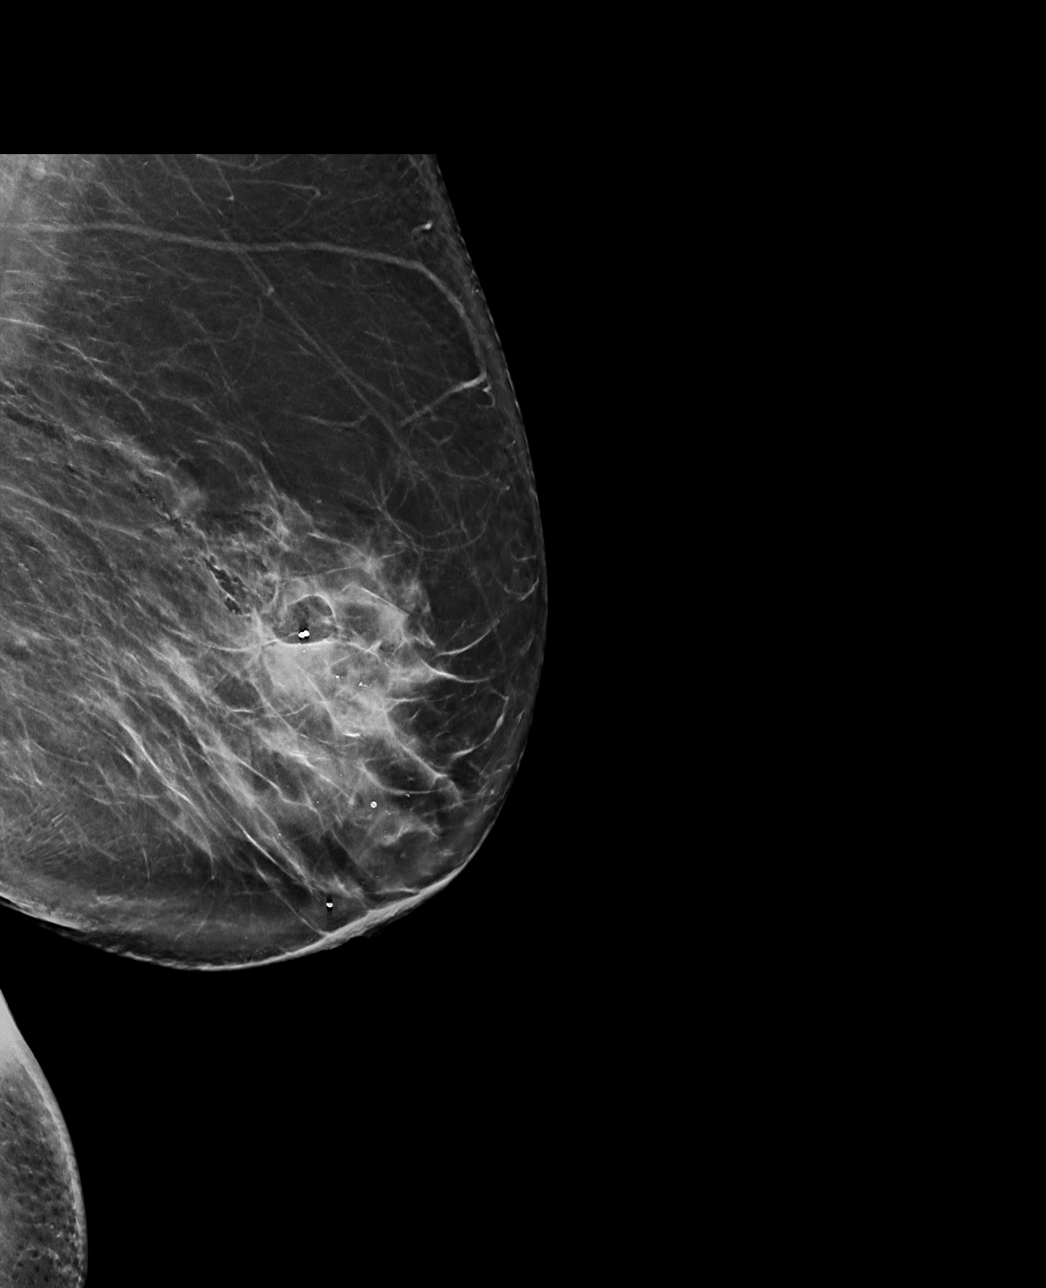

[L ML tomo · tomo slice 48/95.0]
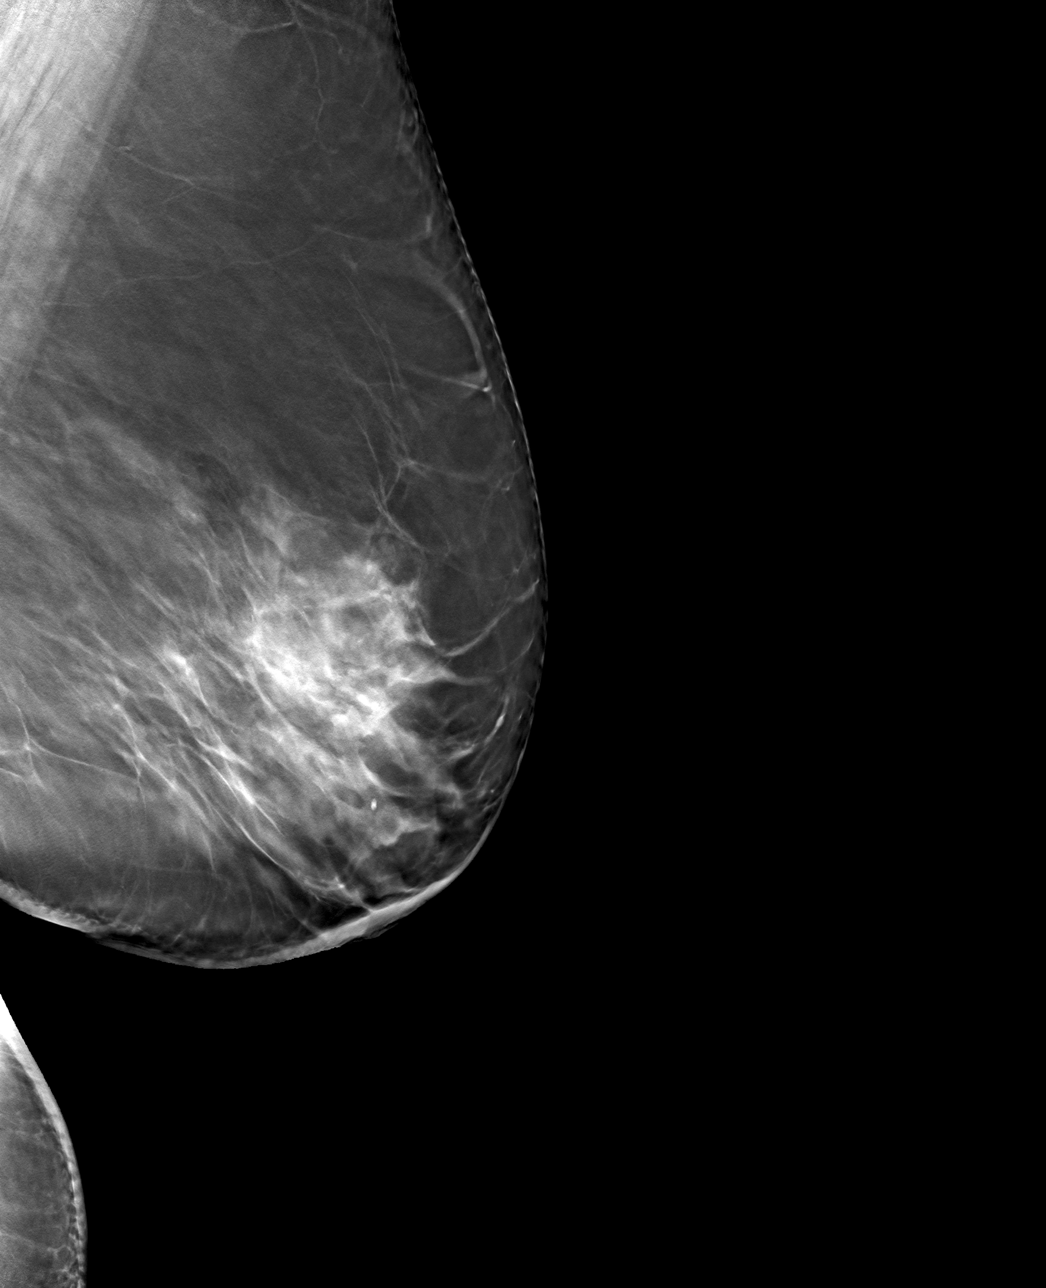

[L CC tomo · tomo slice 48/95.0]
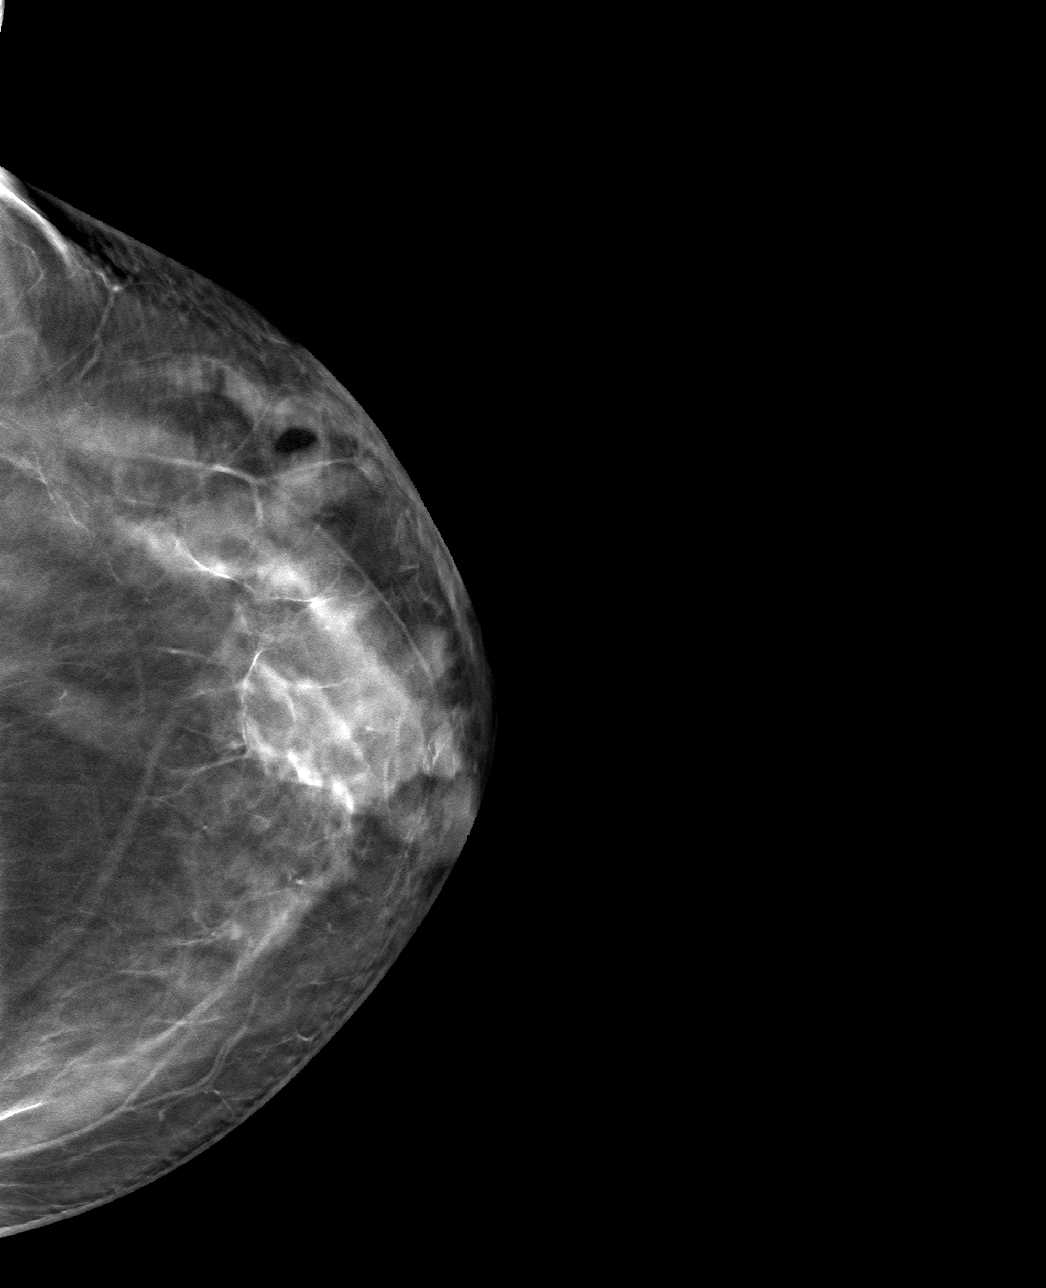

[4 of 12 positions shown; findings below may reference images not displayed]

FINDINGS: 3D Mammographic images were obtained following MRI guided biopsy of
the targeted 1.5 cm mass over the left upper outer quadrant. The
biopsy marking clip is in expected position at the site of biopsy.
IMPRESSION: Appropriate positioning of the barbell shaped biopsy marking clip at
the site of biopsy in the left upper quadrant.

Final Assessment: Post Procedure Mammograms for Marker Placement

## 2022-05-22 IMAGING — MR MR BREAST BX W LOC DEV 1ST LESION IMAGE BX SPEC MR GUIDE*L*
8 of 10 series · 32 of 48 positions shown · IV contrast (10ml gadavist)
Comparison: Previous exams.
COMPARISON: Previous exams.

Addendum:
CLINICAL DATA: Patient with recent biopsy-proven right breast
cancer presents for MRI guided core needle biopsy of an
indeterminate 1.5 cm mass over the middle third of the left upper
outer quadrant.

EXAM:
MRI GUIDED CORE NEEDLE BIOPSY OF THE LEFT BREAST
TECHNIQUE: Multiplanar, multisequence MR imaging of the left breast was
performed both before and after administration of intravenous
contrast.
CONTRAST:  10mL GADAVIST GADOBUTROL 1 MMOL/ML IV SOLN

[Series 2: fiducial unilateral · sagittal · 2.0mm · 1.33mm/px · 1 of 52 slices shown (1 of 2)]
[im 1/52]
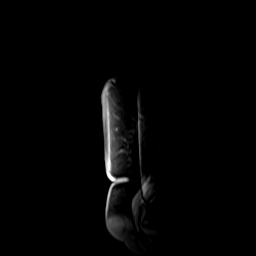

[Series 3: dynamic pre · axial · non-contrast · 1.3mm · 0.73mm/px · z∈[-67,+140]mm · 5 of 160 slices shown (1 of 2)]
[im 1/160]
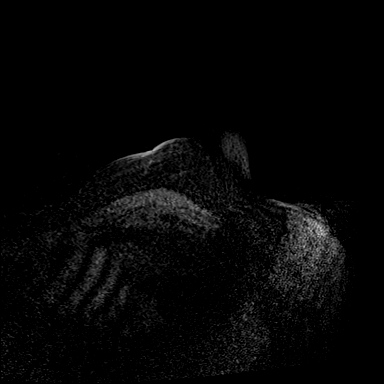
[im 40/160]
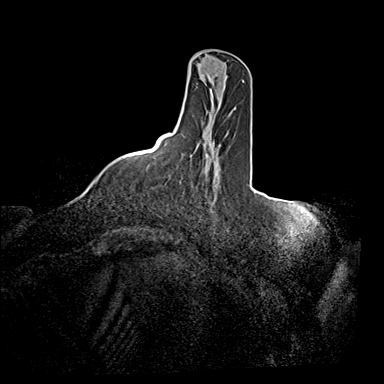
[im 80/160]
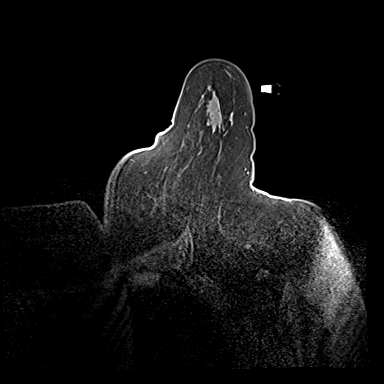
[im 120/160]
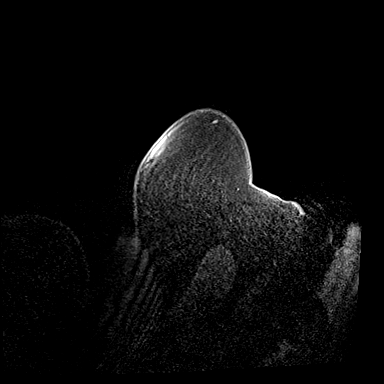
[im 160/160]
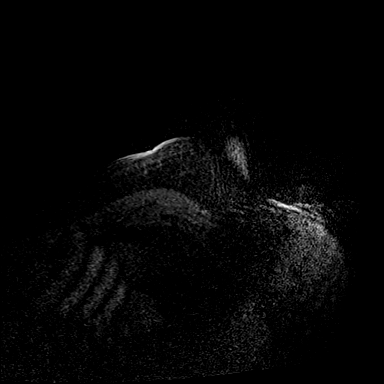

[Series 4: fiducial unilateral · sagittal · 2.0mm · 1.33mm/px · 1 of 52 slices shown (2 of 2)]
[im 1/52]
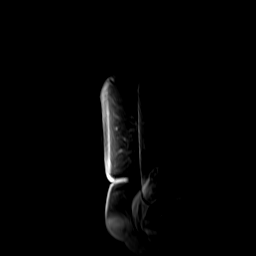

[Series 5: dynamic pre · axial · non-contrast · 1.3mm · 0.73mm/px · z∈[-75,+131]mm · 5 of 160 slices shown (2 of 2)]
[im 1/160]
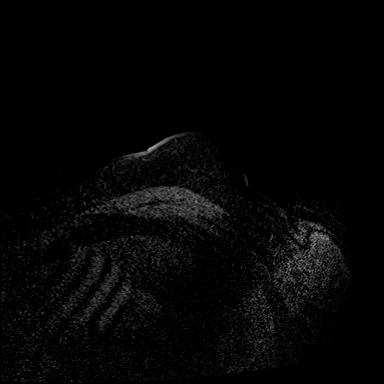
[im 40/160]
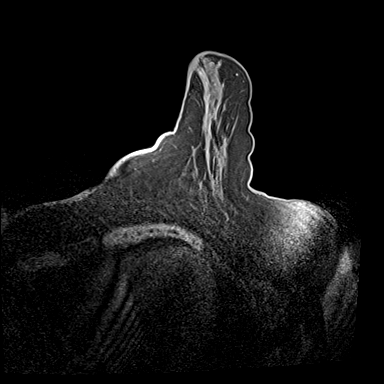
[im 80/160]
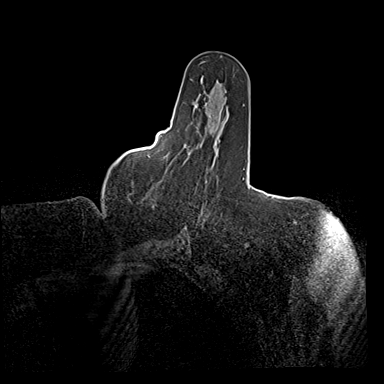
[im 120/160]
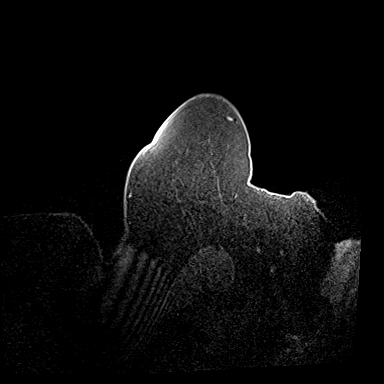
[im 160/160]
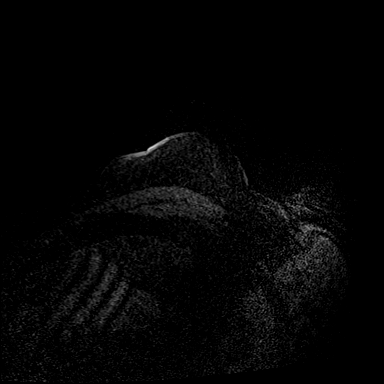

[Series 6: dynamic post 20 · axial · 1.3mm · 0.73mm/px · z∈[-67,+140]mm · 6 of 160 slices shown (1 of 2)]
[im 1/160]
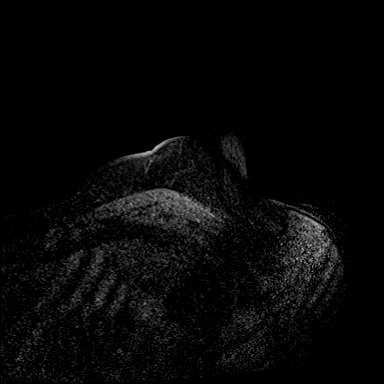
[im 32/160]
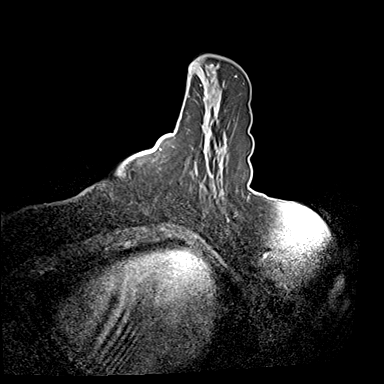
[im 64/160]
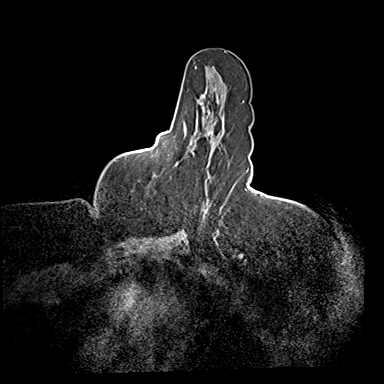
[im 96/160]
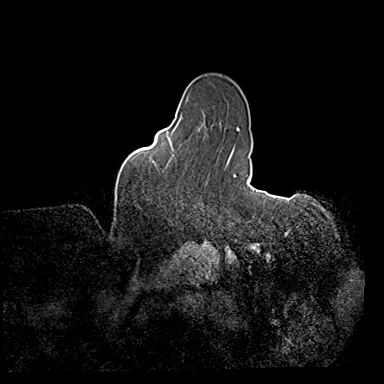
[im 128/160]
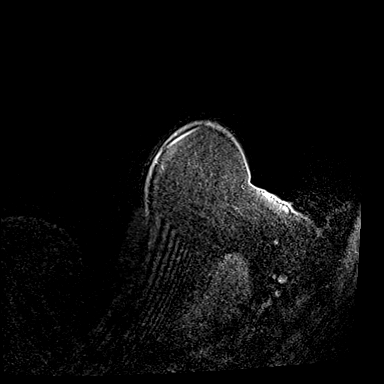
[im 160/160]
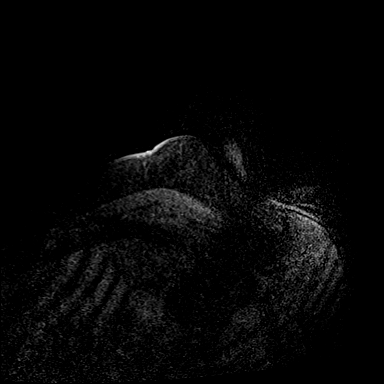

[Series 7: dynamic post 20 · axial · 1.3mm · 0.73mm/px · z∈[-67,+140]mm · 6 of 160 slices shown (2 of 2)]
[im 1/160]
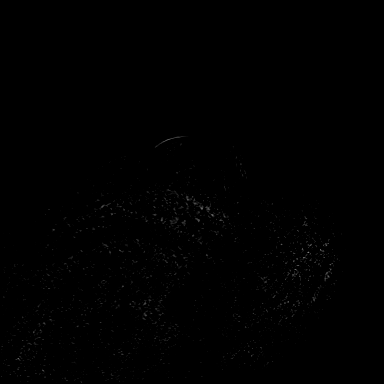
[im 32/160]
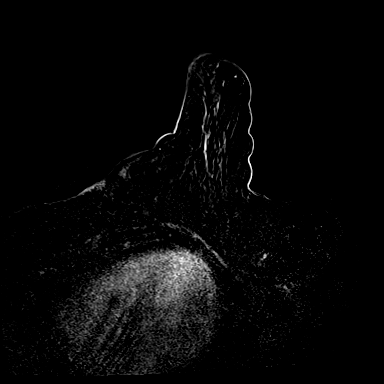
[im 64/160]
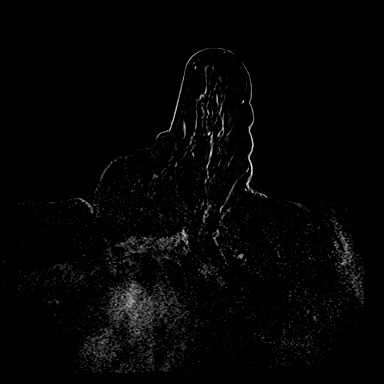
[im 96/160]
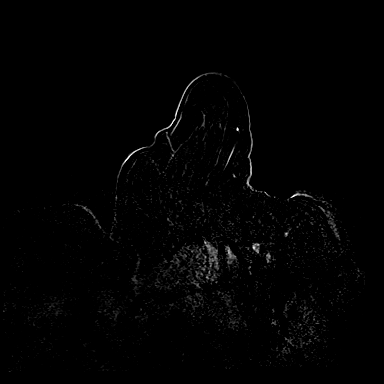
[im 128/160]
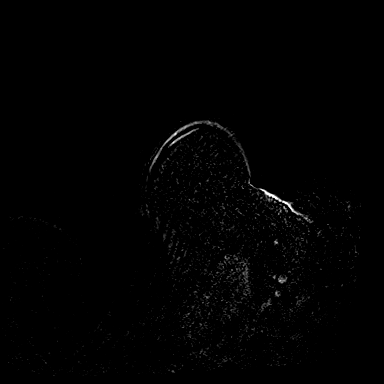
[im 160/160]
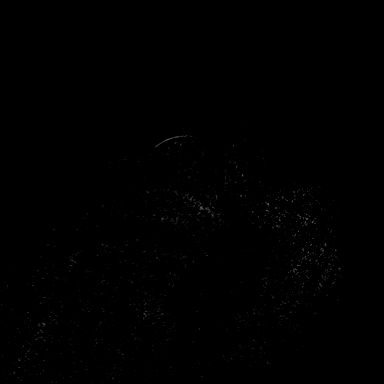

[Series 8: needle confirmation · axial · 1.3mm · 0.73mm/px · z∈[-67,+140]mm · 6 of 160 slices shown]
[im 1/160]
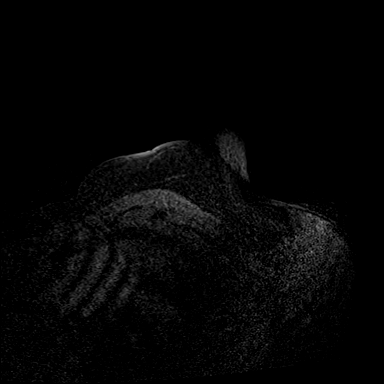
[im 32/160]
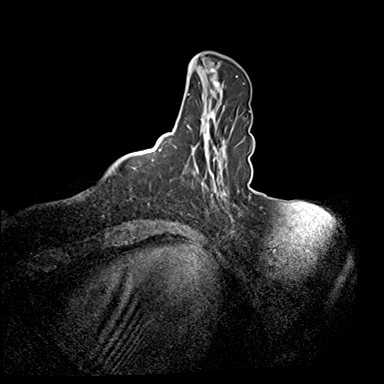
[im 64/160]
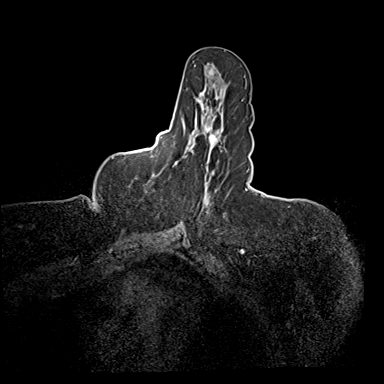
[im 96/160]
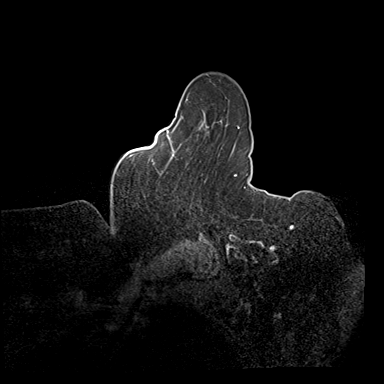
[im 128/160]
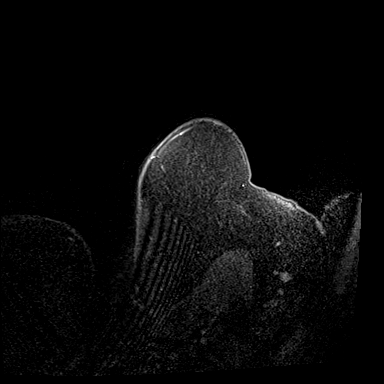
[im 160/160]
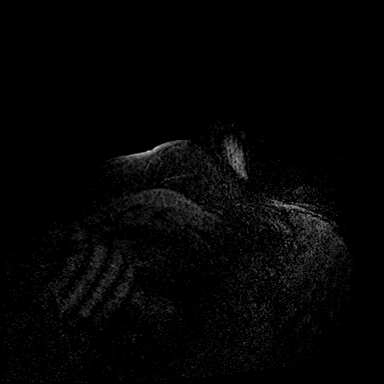

[Series 9: needle confirmation_sub · axial · 1.3mm · 0.73mm/px · z∈[-67,-27]mm · 2 of 160 slices shown]
[im 1/160]
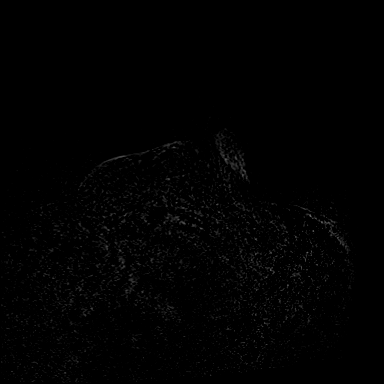
[im 32/160]
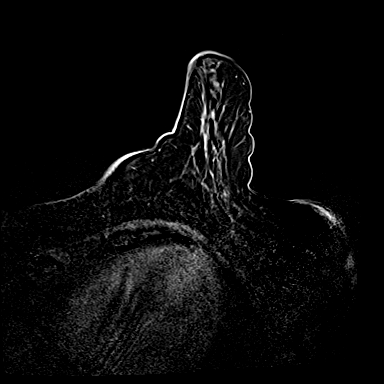

[32 of 48 positions shown; findings below may reference images not displayed]

FINDINGS: I met with the patient, and we discussed the procedure of MRI guided
biopsy, including risks, benefits, and alternatives. Specifically,
we discussed the risks of infection, bleeding, tissue injury, clip
migration, and inadequate sampling. Informed, written consent was
given. The usual time out protocol was performed immediately prior
to the procedure.

Using sterile technique, 1% Lidocaine, MRI guidance, and a 9 gauge
vacuum assisted device, biopsy was performed of the targeted
indeterminate 1.5 cm mass over the left upper outer quadrant using a
lateral to medial approach. At the conclusion of the procedure, a
barbell shaped tissue marker clip was deployed into the biopsy
cavity. Follow-up 2-view mammogram was performed and dictated
separately.
IMPRESSION: MRI guided biopsy of an indeterminate 1.5 cm left breast mass. No
apparent complications.

ADDENDUM:
Pathology revealed GRADE II DUCTAL CARCINOMA IN SITU WITH SOLID,
CRIBRIFORM AND MICROPAPILLARY GROWTH PATTERN, POSITIVE FOR
MICROCALCIFICATIONS AND MINUTE FOCI OF COMEDONECROSIS of the LEFT
breast, upper outer quadrant, (barbell clip). This was found to be
concordant by Dr. MARLIN.

Pathology results were discussed with the patient by telephone. The
patient reported doing well after the biopsy with tenderness at the
site. Post biopsy instructions and care were reviewed and questions
were answered. The patient was encouraged to call The [REDACTED] for any additional concerns. My direct phone
number was provided.

The patient has a recent diagnosis of RIGHT breast cancer and should
follow her outlined treatment plan.

COMMENT: IMMUNOSTAINS ARE BEING PERFORMED ON RULE OUT THE
POSSIBILITY OF A MINUTE FOCUS OF INVASIVE CARCINOMA AND AN ADDENDUM
WILL BE ISSUED.

Pathology results reported by MARLIN, RN on [DATE].

*** End of Addendum ***
FINDINGS: I met with the patient, and we discussed the procedure of MRI guided
biopsy, including risks, benefits, and alternatives. Specifically,
we discussed the risks of infection, bleeding, tissue injury, clip
migration, and inadequate sampling. Informed, written consent was
given. The usual time out protocol was performed immediately prior
to the procedure.

Using sterile technique, 1% Lidocaine, MRI guidance, and a 9 gauge
vacuum assisted device, biopsy was performed of the targeted
indeterminate 1.5 cm mass over the left upper outer quadrant using a
lateral to medial approach. At the conclusion of the procedure, a
barbell shaped tissue marker clip was deployed into the biopsy
cavity. Follow-up 2-view mammogram was performed and dictated
separately.
IMPRESSION: MRI guided biopsy of an indeterminate 1.5 cm left breast mass. No
apparent complications.

## 2022-05-22 MED ORDER — GADOBUTROL 1 MMOL/ML IV SOLN
10.0000 mL | Freq: Once | INTRAVENOUS | Status: AC | PRN
  Administered 2022-05-22: 10 mL via INTRAVENOUS

## 2022-05-28 DIAGNOSIS — Z853 Personal history of malignant neoplasm of breast: Secondary | ICD-10-CM | POA: Diagnosis not present

## 2022-05-29 ENCOUNTER — Other Ambulatory Visit: Payer: Self-pay | Admitting: General Surgery

## 2022-05-29 DIAGNOSIS — Z170 Estrogen receptor positive status [ER+]: Secondary | ICD-10-CM

## 2022-05-29 DIAGNOSIS — D0512 Intraductal carcinoma in situ of left breast: Secondary | ICD-10-CM

## 2022-06-01 ENCOUNTER — Other Ambulatory Visit: Payer: Self-pay | Admitting: General Surgery

## 2022-06-01 ENCOUNTER — Encounter: Payer: Self-pay | Admitting: *Deleted

## 2022-06-01 DIAGNOSIS — Z170 Estrogen receptor positive status [ER+]: Secondary | ICD-10-CM

## 2022-06-01 DIAGNOSIS — E039 Hypothyroidism, unspecified: Secondary | ICD-10-CM

## 2022-06-01 DIAGNOSIS — E119 Type 2 diabetes mellitus without complications: Secondary | ICD-10-CM

## 2022-06-01 DIAGNOSIS — C50911 Malignant neoplasm of unspecified site of right female breast: Secondary | ICD-10-CM

## 2022-06-01 NOTE — Addendum Note (Signed)
Addended by: Robert Bellow on: 06/01/2022 03:45 PM   Modules accepted: Orders

## 2022-06-01 NOTE — Progress Notes (Signed)
Progress Notes - documented in this encounter Valerie Gordon, Geronimo Boot, MD - 05/28/2022 9:00 AM EDT Formatting of this note is different from the original. Images from the original note were not included. Subjective:   Patient ID: Valerie Gordon is a 71 y.o. female.  HPI  The following portions of the patient's history were reviewed and updated as appropriate.  This an established patient is here today for: office visit. Here for her follow up post MRI guided left breast biopsy. She admits to more tenderness toward shoulder blade and bruising with this biopsy than the others.  The patient has been reading extensively regarding her cancer and options for management.  She met with Dr. Rogue Bussing on May 06, 2022 and had a fruitful visit. At present, there looks to be no indication for chemotherapy.  She is here with her husband, Valerie Gordon.  Review of Systems  Constitutional: Negative for chills and fever.  Respiratory: Negative for cough.   Chief Complaint  Patient presents with   Follow-up    BP 138/84  Pulse 92  Temp 36.5 C (97.7 F) (Oral)  Ht 154.9 cm ($RemoveB'5\' 1"'ZDbdNWgr$ )  Wt 96.2 kg (212 lb)  SpO2 96%  BMI 40.06 kg/m   Past Medical History:  Diagnosis Date   Breast cancer (CMS-HCC) 04/28/2022  right   Cataract   Depression   Diabetes (CMS-HCC)  Type 2   GERD (gastroesophageal reflux disease)   Glaucoma   Thyroid disease    Past Surgical History:  Procedure Laterality Date   cardiac catheterization 12/28/1996   COLONOSCOPY 10/30/2004   INCISIONAL BIOPSY BREAST 12/28/2008   COLONOSCOPY 04/01/2016   COLONOSCOPY 04/01/2016   ultrasound guided core breast biopsy Right 04/28/2022   BREAST EXCISIONAL BIOPSY Left 05/22/2022  MRI guided   CATARACT EXTRACTION Bilateral   TONSILLECTOMY    OB History  Gravida  0  Para  0  Term  0  Preterm  0  AB  0  Living  0   SAB  0  IAB  0  Ectopic  0  Molar  0  Multiple  0  Live Births  0    Obstetric Comments   Age at first period 76      Social History   Socioeconomic History   Marital status: Single  Tobacco Use   Smoking status: Never   Smokeless tobacco: Never  Substance and Sexual Activity   Alcohol use: Yes  Comment: occasionally   Drug use: Never    Allergies  Allergen Reactions   Chlorpheniramine Unknown   Fluocinolone Unknown   Trulicity [Dulaglutide] Itching  Injection site   Current Outpatient Medications  Medication Sig Dispense Refill   ascorbic acid, vitamin C, (VITAMIN C) 1000 MG tablet Take 1,000 mg by mouth once daily   b complex vitamins tablet Take 1 tablet by mouth once daily   BD ULTRA-FINE SHORT PEN NEEDLE 31 gauge x 5/16" needle USE WITH INSULIN NIGHTLY   cholecalciferol (VITAMIN D3) 2,000 unit capsule Take by mouth once daily Vitamin k combo   glipiZIDE (GLUCOTROL) 5 MG tablet 2 (two) times daily   insulin GLARGINE (LANTUS SOLOSTAR) pen injector (concentration 100 units/mL) Inject subcutaneously once daily   latanoprost (XALATAN) 0.005 % ophthalmic solution once daily   levothyroxine (SYNTHROID) 100 MCG tablet once daily   magnesium 200 mg Take by mouth once daily   metFORMIN (GLUCOPHAGE-XR) 500 MG XR tablet 2 (two) times daily   pen needle, diabetic 31 gauge x 5/16" needle USE WITH INSULIN  NIGHTLY   rosuvastatin (CRESTOR) 10 MG tablet Take 10 mg by mouth once daily   vitamin E 200 UNIT capsule Take 200 Units by mouth once daily   No current facility-administered medications for this visit.   Family History  Problem Relation Age of Onset   Stroke Mother   Heart disease Father   Breast cancer Neg Hx   Colon cancer Neg Hx   Labs and Radiology:   Breast, left, needle core biopsy, UOQ DUCTAL CARCINOMA IN SITU WITH SOLID, CRIBRIFORM AND MICROPAPILLARY GROWTH PATTERN. LINEAR EXTENT DCIS: 2 TO 6 MM. NUCLEAR GRADE 2. POSITIVE FOR MICROCALCIFICATIONS AND MINUTE FOCI OF COMEDONECROSIS. RESULTS OF PROGNOSTIC MARKERS (ER, PR AND KI-67).WILL BE REPORTED  AS AN ADDENDUM. PLEASE SEE COMMENT. COMMENT: IMMUNOSTAINS ARE BEING PERFORMED ON RULE OUT THE POSSIBILITY OF A MINUTE FOCUS OF INVASIVE CARCINOMA AND AN ADDENDUM WILL BE ISSUED. THE DIAGNOSIS WAS COMMUNICATED WITH LYNNE AT [facility] 05/26/2022 APPROXIMATELY AROUND 9:15 AM. THIS CASE WAS ALSO REVIEWED BY DR. Truman Hayward PICKLESIMER WHO CONCURS WITH THE ABOVE INTERPRETATION.  Estrogen Receptor: 100%, POSITIVE, STRONG STAINING INTENSITY Progesterone Receptor: 100%, POSITIVE, STRONG STAINING INTENSITY Proliferation Marker Ki67: 20% REFERENCE RANGE ESTROGEN RECEPTOR NEGATIVE 0% POSITIVE =>1% REFERENCE RANGE PROGESTERONE RECEPTOR NEGATIVE 0% POSITIVE =>1% The following immunostains are performed with appropriate controls on block 1A; SMA: Positive for myoepithelial cells. P63: Positive for myoepithelial cells. CK5/6: Positive for myoepithelial cells. The above results confirm the absence of invasive carcinoma.   Objective:  Physical Exam Exam conducted with a chaperone present.  Constitutional:  Appearance: Normal appearance.  Chest:   Comments: Moderate bruising in the upper outer quadrant of the left breast post vacuum biopsy. No palpable hematoma. Skin: General: Skin is warm and dry.  Neurological:  Mental Status: She is alert and oriented to person, place, and time.  Psychiatric:  Behavior: Behavior normal.    Assessment:   Invasive mammary carcinoma of the right breast.  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   Plan:   The majority of today's visit was spent reviewing options for management of both cancers. No additional lesions related to the right side tumor with "lobular features". Incidental finding of intermediate grade DCIS on the left  Options for management were reviewed. Mastectomy with or without reconstruction is equivalent to wide excision with radiation. At the time of her original meeting the patient was very "cool" to the idea  of radiation but today it is not a "hard no".  Breast conservation is the path that she is leaning towards. It will be reasonable to have both areas identified by wire localization. We will plan to do a sentinel node biopsy on the right side.  Use of Emla cream prior to SLN injection was reviewed.  This note is partially prepared by Karie Fetch, RN, acting as a scribe in the presence of Dr. Hervey Ard, MD.  The documentation recorded by the scribe accurately reflects the service I personally performed and the decisions made by me.   Robert Bellow, MD FACS  Electronically signed by Mayer Masker, MD at 05/29/2022 2:37 PM EDT

## 2022-06-01 NOTE — Progress Notes (Signed)
Called patient to touch base and see how she is doing since new dx of DCIS in left breast as well as Breast cancer in right breast.   She is doing well, Bil lumpectomy is planned for 6/21 with Dr. Bary Castilla.   Patient knows to call with any questions or concerns.

## 2022-06-03 ENCOUNTER — Ambulatory Visit (INDEPENDENT_AMBULATORY_CARE_PROVIDER_SITE_OTHER): Payer: Medicare HMO | Admitting: Psychologist

## 2022-06-03 DIAGNOSIS — R69 Illness, unspecified: Secondary | ICD-10-CM | POA: Diagnosis not present

## 2022-06-03 DIAGNOSIS — F330 Major depressive disorder, recurrent, mild: Secondary | ICD-10-CM

## 2022-06-03 MED ORDER — LEVOTHYROXINE SODIUM 100 MCG PO TABS: ORAL_TABLET | ORAL | 1 refills | Status: DC

## 2022-06-03 MED ORDER — METFORMIN HCL ER 500 MG PO TB24: ORAL_TABLET | ORAL | 1 refills | Status: DC

## 2022-06-03 MED ORDER — BASAGLAR KWIKPEN 100 UNIT/ML ~~LOC~~ SOPN: 32.0000 [IU] | PEN_INJECTOR | Freq: Every day | SUBCUTANEOUS | 1 refills | Status: DC

## 2022-06-03 MED ORDER — BD PEN NEEDLE SHORT U/F 31G X 8 MM MISC: 1 refills | Status: AC

## 2022-06-03 NOTE — Progress Notes (Signed)
[facility] Counselor/Therapist Progress Note  Patient ID: Valerie Gordon, MRN: [id number],    Date: 06/03/2022  Time Spent: 3:03 pm to 3:45 pm; total time: 42 minutes   This session was held via video webex teletherapy due to the coronavirus risk at this time. The patient consented to video teletherapy and was located at her home during this session. She is aware it is the responsibility of the patient to secure confidentiality on her end of the session. The provider was in a private home office for the duration of this session. Limits of confidentiality were discussed with the patient.   Treatment Type: Individual Therapy  Reported Symptoms: Depressive symptoms secondary to recent breast cancer diagnosis  Mental Status Exam: Appearance:  Casual     Behavior: Appropriate  Motor: Normal  Speech/Language:  Clear and Coherent  Affect: Appropriate  Mood: normal  Thought process: normal  Thought content:   WNL  Sensory/Perceptual disturbances:   WNL  Orientation: oriented to person, place, and time/date  Attention: Good  Concentration: Good  Memory: WNL  Fund of knowledge:  Good  Insight:   Fair  Judgment:  Fair  Impulse Control: Good   Risk Assessment: Danger to Self:  No Self-injurious Behavior: No Danger to Others: No Duty to Warn:no Physical Aggression / Violence:No  Access to Firearms a concern: No  Gang Involvement:No   Subjective: Beginning the session, patient described herself as okay indicating that she has had some challenging unexpected events since the last session. Elaborating, she disclosed that she was diagnosed with breast cancer and is undergoing a double lumpectomy June 21st. She spent the session processing her thoughts and emotions surrounding this new diagnosis. She voiced concern that she will become more depressed due to the new diagnosis. She asked to follow up. She denied suicidal and homicidal ideation.    Interventions:  Worked on  developing a therapeutic relationship with the patient using active listening and reflective statements. Provided emotional support using empathy and validation. Used summary statements. Reviewed events since the last session. Normalized and validated expressed thoughts and emotions related to the cancer diagnosis. Used a Product/process development scientist to assist the patient gain insight into self. Validated some of the concerns that patient expressed. Provided psychoeducation about looking up information. Normalized concerns related to what she can and can't do. Used socratic questions to assist the patient gain insight into self. Explored ways that patient can still continue living her life. Reflected on the decision to have a double lumpectomy versus a double mastectomy. Discussed next steps for counseling. Praised patient for the work she has put into the process of counseling and her growth in counseling. Assessed for suicidal and homicidal ideation.   Homework: NA  Next Session: Emotional support  Diagnosis: F33.0 major depressive affective disorder, recurrent, mild   Plan:    Goals Alleviate depressive symptoms Recognize, accept, and cope with depressive feelings Develop healthy thinking patterns Develop healthy interpersonal relationships  Objectives target date for all objectives is 01/21/2023 Cooperate with a medication evaluation by a physician Verbalize an accurate understanding of depression Verbalize an understanding of the treatment Identify and replace thoughts that support depression Learn and implement behavioral strategies Verbalize an understanding and resolution of current interpersonal problems Learn and implement problem solving and decision making skills Learn and implement conflict resolution skills to resolve interpersonal problems Verbalize an understanding of healthy and unhealthy emotions verbalize insight into how past relationships may be influence current experiences with  depression Use mindfulness and acceptance  strategies and increase value based behavior  Increase hopeful statements about the future.  Interventions Consistent with treatment model, discuss how change in cognitive, behavioral, and interpersonal can help client alleviate depression CBT Behavioral activation help the client explore the relationship, nature of the dispute,  Help the client develop new interpersonal skills and relationships Conduct Problem so living therapy Teach conflict resolution skills Use a process-experiential approach Conduct TLDP Conduct ACT Evaluate need for psychotropic medication Monitor adherence to medication   The patient and clinician reviewed the treatment plan on 02/16/2022. The patient approved of the treatment plan.   Conception Chancy, PsyD

## 2022-06-03 NOTE — Progress Notes (Signed)
                Harald Quevedo, PsyD 

## 2022-06-05 ENCOUNTER — Encounter: Payer: Self-pay | Admitting: Internal Medicine

## 2022-06-05 ENCOUNTER — Inpatient Hospital Stay: Payer: Medicare HMO | Attending: Internal Medicine | Admitting: Internal Medicine

## 2022-06-05 DIAGNOSIS — Z170 Estrogen receptor positive status [ER+]: Secondary | ICD-10-CM | POA: Diagnosis not present

## 2022-06-05 DIAGNOSIS — E079 Disorder of thyroid, unspecified: Secondary | ICD-10-CM | POA: Diagnosis not present

## 2022-06-05 DIAGNOSIS — E1136 Type 2 diabetes mellitus with diabetic cataract: Secondary | ICD-10-CM | POA: Diagnosis not present

## 2022-06-05 DIAGNOSIS — Z801 Family history of malignant neoplasm of trachea, bronchus and lung: Secondary | ICD-10-CM | POA: Diagnosis not present

## 2022-06-05 DIAGNOSIS — Z800 Family history of malignant neoplasm of digestive organs: Secondary | ICD-10-CM | POA: Insufficient documentation

## 2022-06-05 DIAGNOSIS — R232 Flushing: Secondary | ICD-10-CM | POA: Insufficient documentation

## 2022-06-05 DIAGNOSIS — K219 Gastro-esophageal reflux disease without esophagitis: Secondary | ICD-10-CM | POA: Insufficient documentation

## 2022-06-05 DIAGNOSIS — C50411 Malignant neoplasm of upper-outer quadrant of right female breast: Secondary | ICD-10-CM | POA: Diagnosis not present

## 2022-06-05 DIAGNOSIS — Z79890 Hormone replacement therapy (postmenopausal): Secondary | ICD-10-CM | POA: Diagnosis not present

## 2022-06-05 DIAGNOSIS — Z7984 Long term (current) use of oral hypoglycemic drugs: Secondary | ICD-10-CM | POA: Insufficient documentation

## 2022-06-05 DIAGNOSIS — Z79899 Other long term (current) drug therapy: Secondary | ICD-10-CM | POA: Insufficient documentation

## 2022-06-05 DIAGNOSIS — M8580 Other specified disorders of bone density and structure, unspecified site: Secondary | ICD-10-CM | POA: Diagnosis not present

## 2022-06-05 DIAGNOSIS — Z794 Long term (current) use of insulin: Secondary | ICD-10-CM | POA: Insufficient documentation

## 2022-06-05 DIAGNOSIS — Z8616 Personal history of COVID-19: Secondary | ICD-10-CM | POA: Insufficient documentation

## 2022-06-05 NOTE — Progress Notes (Unsigned)
[facility] [place] NOTE  Patient Care Team: Pleas Koch, NP as PCP - General (Internal Medicine) Ammie Dalton, Okey Regal, MD as Consulting Physician (Obstetrics and Gynecology) Bary Castilla, Forest Gleason, MD (General Surgery) Kennith Center, RD as Dietitian (Family Medicine) Theodore Demark, RN (Inactive) as Oncology Nurse Navigator Daiva Huge, RN as Oncology Nurse Navigator Cammie Sickle, MD as Consulting Physician (Oncology)  CHIEF COMPLAINTS/PURPOSE OF CONSULTATION: Breast cancer  #  Oncology History Overview Note  IMPRESSION: 1. 1.3 cm irregular mass with associated distortion in the right breast at 11 o'clock, 2 cm the nipple, highly suspicious for malignancy. No abnormal right axillary lymph nodes.   RECOMMENDATION: 1. Ultrasound-guided core needle biopsy of the right breast highly suspicious mass. A.  BREAST MASS, RIGHT 11:00 2 CM FN; ULTRASOUND-GUIDED BIOPSY:  - INVASIVE MAMMARY CARCINOMA WITH LOBULAR FEATURES.   Size of invasive carcinoma: 8 mm in this sample  Histologic grade of invasive carcinoma: Grade 2                       Glandular/tubular differentiation score: 3                       Nuclear pleomorphism score: 2                       Mitotic rate score: 1                       Total score: 6  Ductal carcinoma in situ: Present, intermediate grade  Lymphovascular invasion: Not identified   ER/PR/HER2: Immunohistochemistry will be performed on block A2, with  reflex to [place] for HER2 2+. The results will be reported in an addendum.   Comment:  The definitive grade will be assigned on the excisional specimen.   GROSS DESCRIPTION:  A. Labeled: Right breast 11:00 2 cm from the nipple  Received: Formalin  Time/date in fixative: Collected and placed in formalin at 8:30 AM on  04/28/2022  Cold ischemic time: Less than 1 minute  Total fixation time: Approximately 9 hours  Core pieces: 4  Size: Ranges from 0.5-2.0 cm in length and 0.2 cm in  diameter  Description: Tan cores of fibrofatty tissue  Ink color: Black  Entirely submitted in 2 cassettes with 2 cores in A1 and 2 cores in A2.   CM 04/28/2022   Final Diagnosis performed by Quay Burow, MD.   Electronically signed  04/29/2022 1:04:41PM  The electronic signature indicates that the named Attending Pathologist  has evaluated the specimen  Technical component performed at [facility], [address] [place],  [place] [address] Lab: [phone] Dir: Rush Farmer, MD, MMM   Professional component performed at [facility], [facility], [place], [employer], [place] [address] Lab: [phone]  Dir: Kathi Simpers, MD   ADDENDUM:  CASE SUMMARY: BREAST BIOMARKER TESTS  Estrogen Receptor (ER) Status: POSITIVE          Percentage of cells with nuclear positivity: Greater than 90%          Average intensity of staining: Strong   Progesterone Receptor (PgR) Status: POSITIVE          Percentage of cells with nuclear positivity: Greater than 90%          Average intensity of staining: Strong   HER2 (by immunohistochemistry): NEGATIVE (Score 0)  Ki-67: Not performed   #April  2023 clinical stage I -ER/PR positive HER2 =0 invasive mammary carcinoma with lobular features.  [Dr.Byrnett] May 2023 breast MRI-right side lesion-measuring 3.4 x 1.7 x 2.2 centimeters.     #May 2023 breast MRI showed-left breast indeterminate oval 1.5 centimeter mass in the UPPER-OUTER QUADRANT LEFT.  S/p biopsy positive for ER/PR positive DCIS.   Carcinoma of upper-outer quadrant of right breast in female, estrogen receptor positive (HCC)  05/06/2022 Initial Diagnosis   Carcinoma of upper-outer quadrant of right breast in female, estrogen receptor positive (HCC)   05/06/2022 Cancer Staging   Staging form: Breast, AJCC 8th Edition - Clinical: Stage IA (cT1c, cN0, cM0, G2, ER+, PR+, HER2-) - Signed by Earna Coder, MD on 05/06/2022 Histologic grading system: 3 grade system     HISTORY OF PRESENTING ILLNESS: Ambulating independently.  Accompanied by significant other.  Valerie Gordon 71 y.o.  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    Patient had bilateral MRI-which incidentally showed left breast enhancing lesion for which she underwent a biopsy.  In the interim patient has been evaluated by Dr. Lemar Livings.  Awaiting surgery next 10 days or so.  Review of Systems  Constitutional:  Negative for chills, diaphoresis, fever, malaise/fatigue and weight loss.  HENT:  Negative for nosebleeds and sore throat.   Eyes:  Negative for double vision.  Respiratory:  Negative for cough, hemoptysis, sputum production, shortness of breath and wheezing.   Cardiovascular:  Negative for chest pain, palpitations, orthopnea and leg swelling.  Gastrointestinal:  Negative for abdominal pain, blood in stool, constipation, diarrhea, heartburn, melena, nausea and vomiting.  Genitourinary:  Negative for dysuria, frequency and urgency.  Musculoskeletal:  Negative for back pain and joint pain.  Skin: Negative.  Negative for itching and rash.  Neurological:  Negative for dizziness, tingling, focal weakness, weakness and headaches.  Endo/Heme/Allergies:  Does not bruise/bleed easily.  Psychiatric/Behavioral:  Negative for depression. The patient is not nervous/anxious and does not have insomnia.      MEDICAL HISTORY:  Past Medical History:  Diagnosis Date   Cataract    COVID-19 virus infection 10/14/2021   Depression    GERD (gastroesophageal reflux disease)    Glaucoma    Thyroid disease    Type 2 diabetes mellitus (HCC)    manages with diet and exercise    SURGICAL HISTORY: Past Surgical History:  Procedure Laterality Date   BREAST BIOPSY  12/28/2008   BREAST BIOPSY Right 04/28/2022   [place] core bx 11:00 coil clip - path pending   CARDIAC CATHETERIZATION  12/28/1996   COLONOSCOPY  10/30/2004    COLONOSCOPY WITH PROPOFOL N/A 04/01/2016   Procedure: COLONOSCOPY WITH PROPOFOL;  Surgeon: Earline Mayotte, MD;  Location: [facility] ENDOSCOPY;  Service: Endoscopy;  Laterality: N/A;   EYE SURGERY Bilateral    cataract sx   TONSILLECTOMY      SOCIAL HISTORY: Social History   Socioeconomic History   Marital status: Widowed    Spouse name: Not on file   Number of children: Not on file   Years of education: Not on file   Highest education level: Not on file  Occupational History   Not on file  Tobacco Use   Smoking status: Never   Smokeless tobacco: Never  Vaping Use   Vaping Use: Never used  Substance and Sexual Activity   Alcohol use: Yes    Comment: occasional wine   Drug use: No  Sexual activity: Yes  Other Topics Concern   Not on file  Social History Narrative   Widower.   Step children.   Environmental health practitioner.   Enjoys riding hot air balloons, spending time with friends, reading, jewelry   Social Determinants of Health   Financial Resource Strain: Low Risk  (12/01/2021)   Overall Financial Resource Strain (CARDIA)    Difficulty of Paying Living Expenses: Not hard at all  Food Insecurity: No Food Insecurity (12/01/2021)   Hunger Vital Sign    Worried About Running Out of Food in the Last Year: Never true    Ran Out of Food in the Last Year: Never true  Transportation Needs: No Transportation Needs (12/01/2021)   PRAPARE - Hydrologist (Medical): No    Lack of Transportation (Non-Medical): No  Physical Activity: Sufficiently Active (12/01/2021)   Exercise Vital Sign    Days of Exercise per Week: 4 days    Minutes of Exercise per Session: 40 min  Stress: No Stress Concern Present (12/01/2021)   [place]    Feeling of Stress : Not at all  Social Connections: Moderately Isolated (12/01/2021)   Social Connection and Isolation Panel [NHANES]    Frequency of  Communication with Friends and Family: More than three times a week    Frequency of Social Gatherings with Friends and Family: Three times a week    Attends Religious Services: Never    Active Member of Clubs or Organizations: Yes    Attends Archivist Meetings: More than 4 times per year    Marital Status: Widowed  Intimate Partner Violence: Not At Risk (12/01/2021)   Humiliation, Afraid, Rape, and Kick questionnaire    Fear of Current or Ex-Partner: No    Emotionally Abused: No    Physically Abused: No    Sexually Abused: No    FAMILY HISTORY: Family History  Problem Relation Age of Onset   Stroke Mother    Heart disease Father    Lung cancer Maternal Grandmother    Stomach cancer Paternal Grandmother    Breast cancer Neg Hx     ALLERGIES:  is allergic to antihistamines, chlorpheniramine-type; ciprocin-fluocin-procin [fluocinolone]; and trulicity [dulaglutide].  MEDICATIONS:  Current Outpatient Medications  Medication Sig Dispense Refill   b complex vitamins tablet Take 1 tablet by mouth daily.     BIOTIN PO Take 5 mg by mouth.     Cholecalciferol 50 MCG (2000 UT) CAPS Take by mouth.     famotidine (PEPCID) 20 MG tablet Take 20 mg by mouth at bedtime.     fluticasone (FLONASE) 50 MCG/ACT nasal spray Place into both nostrils daily as needed for allergies or rhinitis.     glipiZIDE (GLUCOTROL) 5 MG tablet Take 1 tablet (5 mg total) by mouth 2 (two) times daily before a meal. For diabetes. 180 tablet 3   Insulin Glargine (BASAGLAR KWIKPEN) 100 UNIT/ML Inject 32 Units into the skin daily. For diabetes. 30 mL 1   Insulin Pen Needle (B-D ULTRAFINE III SHORT PEN) 31G X 8 MM MISC USE WITH INSULIN NIGHTLY 100 each 1   Krill Oil 300 MG CAPS Take by mouth.     latanoprost (XALATAN) 0.005 % ophthalmic solution 1 drop at bedtime.     levothyroxine (SYNTHROID) 100 MCG tablet Take 1 tablet by mouth every morning on an empty stomach with water only.  No food or other medications for  30  minutes. 90 tablet 1   Magnesium 200 MG TABS Take by mouth.     Menaquinone-7 (VITAMIN K2 PO) Take 19 mcg by mouth.     metFORMIN (GLUCOPHAGE-XR) 500 MG 24 hr tablet TAKE 2 TABLETS BY MOUTH TWICE DAILY WITH A MEAL FOR DIABETES 360 tablet 1   rosuvastatin (CRESTOR) 10 MG tablet TAKE 1 TABLET BY MOUTH ONCE DAILY FOR CHOLESTEROL 90 tablet 3   VITAMIN A PO Take 2,400 mcg by mouth.     benzonatate (TESSALON) 200 MG capsule Take 1 capsule (200 mg total) by mouth 3 (three) times daily as needed for cough. (Patient not taking: Reported on 05/06/2022) 15 capsule 0   No current facility-administered medications for this visit.      Marland Kitchen  PHYSICAL EXAMINATION: ECOG PERFORMANCE STATUS: 0 - Asymptomatic  Vitals:   06/05/22 1300  BP: (!) 183/85  Pulse: 73  Resp: 18  Temp: 98.7 F (37.1 C)   Filed Weights   06/05/22 1300  Weight: 212 lb 6.4 oz (96.3 kg)    Physical Exam Vitals and nursing note reviewed.  HENT:     Head: Normocephalic and atraumatic.     Mouth/Throat:     Pharynx: Oropharynx is clear.  Eyes:     Extraocular Movements: Extraocular movements intact.     Pupils: Pupils are equal, round, and reactive to light.  Cardiovascular:     Rate and Rhythm: Normal rate and regular rhythm.  Pulmonary:     Comments: Decreased breath sounds bilaterally.  Abdominal:     Palpations: Abdomen is soft.  Musculoskeletal:        General: Normal range of motion.     Cervical back: Normal range of motion.  Skin:    General: Skin is warm.  Neurological:     General: No focal deficit present.     Mental Status: She is alert and oriented to person, place, and time.  Psychiatric:        Behavior: Behavior normal.        Judgment: Judgment normal.      LABORATORY DATA:  I have reviewed the data as listed Lab Results  Component Value Date   WBC 10.3 05/14/2021   HGB 14.0 05/14/2021   HCT 41.1 05/14/2021   MCV 93.0 05/14/2021   PLT 267.0 05/14/2021   Recent Labs     01/12/22 0942  NA 131*  K 4.9  CL 97  CO2 26  GLUCOSE 172*  BUN 10  CREATININE 0.84  CALCIUM 9.9  PROT 7.3  ALBUMIN 4.5  AST 61*  ALT 26  ALKPHOS 70  BILITOT 0.5    RADIOGRAPHIC STUDIES: I have personally reviewed the radiological images as listed and agreed with the findings in the report. MR LT BREAST BX W LOC DEV 1ST LESION IMAGE BX SPEC MR GUIDE  Addendum Date: 05/27/2022   ADDENDUM REPORT: 05/27/2022 08:59 ADDENDUM: Pathology revealed GRADE II DUCTAL CARCINOMA IN SITU WITH SOLID, CRIBRIFORM AND MICROPAPILLARY GROWTH PATTERN, POSITIVE FOR MICROCALCIFICATIONS AND MINUTE FOCI OF COMEDONECROSIS of the LEFT breast, upper outer quadrant, (barbell clip). This was found to be concordant by Dr. Marin Olp. Pathology results were discussed with the patient by telephone. The patient reported doing well after the biopsy with tenderness at the site. Post biopsy instructions and care were reviewed and questions were answered. The patient was encouraged to call [facility] for any additional concerns. My direct phone number was provided. The patient has a recent diagnosis of  RIGHT breast cancer and should follow her outlined treatment plan. COMMENT: IMMUNOSTAINS ARE BEING PERFORMED ON RULE OUT THE POSSIBILITY OF A MINUTE FOCUS OF INVASIVE CARCINOMA AND AN ADDENDUM WILL BE ISSUED. Pathology results reported by Terie Purser, RN on 05/27/2022. Electronically Signed   By: Marin Olp M.D.   On: 05/27/2022 08:59   Result Date: 05/27/2022 CLINICAL DATA:  Patient with recent biopsy-proven right breast cancer presents for MRI guided core needle biopsy of an indeterminate 1.5 cm mass over the middle third of the left upper outer quadrant. EXAM: MRI GUIDED CORE NEEDLE BIOPSY OF THE LEFT BREAST TECHNIQUE: Multiplanar, multisequence MR imaging of the left breast was performed both before and after administration of intravenous contrast. CONTRAST:  40mL GADAVIST GADOBUTROL 1 MMOL/ML  IV SOLN COMPARISON:  Previous exams. FINDINGS: I met with the patient, and we discussed the procedure of MRI guided biopsy, including risks, benefits, and alternatives. Specifically, we discussed the risks of infection, bleeding, tissue injury, clip migration, and inadequate sampling. Informed, written consent was given. The usual time out protocol was performed immediately prior to the procedure. Using sterile technique, 1% Lidocaine, MRI guidance, and a 9 gauge vacuum assisted device, biopsy was performed of the targeted indeterminate 1.5 cm mass over the left upper outer quadrant using a lateral to medial approach. At the conclusion of the procedure, a barbell shaped tissue marker clip was deployed into the biopsy cavity. Follow-up 2-view mammogram was performed and dictated separately. IMPRESSION: MRI guided biopsy of an indeterminate 1.5 cm left breast mass. No apparent complications. Electronically Signed: By: Marin Olp M.D. On: 05/22/2022 10:24  MM CLIP PLACEMENT LEFT  Result Date: 05/22/2022 CLINICAL DATA:  Patient is post MRI core needle biopsy of a indeterminate 1.5 cm mass over the middle third of the left upper outer quadrant. Recent diagnosis right breast cancer. EXAM: 3D DIAGNOSTIC LEFT MAMMOGRAM POST MRI BIOPSY. COMPARISON:  Previous exam(s). FINDINGS: 3D Mammographic images were obtained following MRI guided biopsy of the targeted 1.5 cm mass over the left upper outer quadrant. The biopsy marking clip is in expected position at the site of biopsy. IMPRESSION: Appropriate positioning of the barbell shaped biopsy marking clip at the site of biopsy in the left upper quadrant. Final Assessment: Post Procedure Mammograms for Marker Placement Electronically Signed   By: Marin Olp M.D.   On: 05/22/2022 10:18  MR BREAST BILATERAL W WO CONTRAST INC CAD  Result Date: 05/15/2022 CLINICAL DATA:  Invasive breast cancer staging. Ultrasound-guided core biopsy of mass at 11 o'clock 2 centimeters from  the nipple of the RIGHT breast was performed on 04/28/2022 and demonstrated grade 2 invasive mammary carcinoma with lobular features and ductal carcinoma in situ. EXAM: BILATERAL BREAST MRI WITH AND WITHOUT CONTRAST TECHNIQUE: Multiplanar, multisequence MR images of both breasts were obtained prior to and following the intravenous administration of ml of Gadavist Three-dimensional MR images were rendered by post-processing of the original MR data on an independent workstation. The three-dimensional MR images were interpreted, and findings are reported in the following complete MRI report for this study. Three dimensional images were evaluated at the independent interpreting workstation using the DynaCAD thin client. COMPARISON:  Multiple prior studies including 04/28/2022 FINDINGS: Breast composition: c. Heterogeneous fibroglandular tissue. Background parenchymal enhancement: Moderate. Right breast: Tissue marker clip is identified in the anterior UPPER OUTER QUADRANT of the RIGHT breast, at the site of recently diagnosed grade 2 invasive mammary carcinoma with lobular features and ductal carcinoma in situ. In this region, there is  no significant enhancement above background. However, within the retroareolar region of the RIGHT breast and extending to the biopsy site, there is a branching ductal system which is hyperintense on noncontrast T1 weighted images. The appearance is asymmetric compared to the LEFT breast. The extent of this ductal system is 3.4 x 1.7 x 2.2 centimeters, extending to the nipple anteriorly and posteriorly to the site of the biopsy in the anterior UPPER OUTER QUADRANT. (Series 5, images 39-50). No other areas of concern in the RIGHT breast. Left breast: Within the UPPER-OUTER QUADRANT at middle depth in the LEFT breast, there is an oval enhancing mass with irregular margins (image 42 of series 10). Mass measures 1.5 x 0.7 centimeters and demonstrates plateau kinetics. There is no noncontrast  correlate for this abnormality. No other areas of concern in the LEFT breast. Lymph nodes: No abnormal appearing lymph nodes. There are 2 benign adjacent 4 millimeter lymph nodes at the anterior aspect of the RIGHT hemidiaphragm. Ancillary findings:  None. IMPRESSION: 1. Post biopsy changes in the anterior UPPER OUTER QUADRANT of the RIGHT breast. There is no significant enhancement above background in this region. However, a ductal system with T1 hyperintense signal extends from the nipple to the biopsied lesion and may define extent of disease, measuring 3.4 x 1.7 x 2.2 centimeters. 1. Indeterminate oval 1.5 centimeter mass in the UPPER-OUTER QUADRANT LEFT breast warranting tissue diagnosis. Definitive sonographic correlate is unlikely given the lack of noncontrast correlate for this mass. RECOMMENDATION: 1. If breast conservation is being considered for the RIGHT breast, consider MR directed core biopsy of the non-enhanced T1 hyperintense ducts in the retroareolar region of the RIGHT breast. 2. MR guided core biopsy of LEFT breast mass. BI-RADS CATEGORY  4: Suspicious. Electronically Signed   By: Nolon Nations M.D.   On: 05/15/2022 12:19   ASSESSMENT & PLAN:   Carcinoma of upper-outer quadrant of right breast in female, estrogen receptor positive ([place]) #Right breast cancer stage I PT1CN0-invasive mammary carcinoma with lobular features.  However may 2023 breast MRI-right side lesion-measuring 3.4 x 1.7 x 2.2 centimeters.  Awaiting surgery-in June.  #May 2023 breast MRI showed-left breast indeterminate oval 1.5 centimeter mass in the UPPER-OUTER QUADRANT LEFT.  S/p biopsy positive for ER/PR positive DCIS.  #Patient s/p evaluation with Dr. Carita Pian bilateral lumpectomy i in about 10 days.  Plan for sentinel lymph node biopsy on the right; none on the left.   #Again reviewed with the patient that patient unlikely to need any chemotherapy based on current characteristics.  However she would  benefit from adjuvant radiation. Patient is interested in speaking to radiation oncology regarding even prior to surgery.  We will make a referral to Dr. Donella Stade.    #I again reviewed and discussed the role of endocrine therapy-given ER/PR positive disease postsurgery.  Patient will be offered antihormone pill 1 a day for 5 years.  Discussed potential downsides including but not limited to arthralgias hot flashes and osteoporosis.  Patient had severe hot flashes after her natural menopause.  Discussed that this will also help prevent developing any cancers in the contralateral breast/DCIS recurrence.  #Osteopenia: BMD March 2023- T-score of -1.9.  Monitor for now.  # DISPOSITION: # refer to Dr.Chrystal- next week/prior to surgery/  re: breast cancer-right; DCIS- left # follow up week of July 10th-  MD; no labs-Dr.B    All questions were answered. The patient/family knows to call the clinic with any problems, questions or concerns.  Cammie Sickle, MD 06/07/2022 10:40 PM

## 2022-06-05 NOTE — Assessment & Plan Note (Signed)
#  Right breast cancer stage I PT1CN0-invasive mammary carcinoma with lobular features.  However may 2023 breast MRI-right side lesion-measuring 3.4 x 1.7 x 2.2 centimeters.  Awaiting surgery-in June.  #May 2023 breast MRI showed-left breast indeterminate oval 1.5 centimeter mass in the UPPER-OUTER QUADRANT LEFT.  S/p biopsy positive for ER/PR positive DCIS.  #Patient s/p evaluation with Dr. Carita Pian bilateral lumpectomy i in about 10 days.  Plan for sentinel lymph node biopsy on the right; none on the left.   #Again reviewed with the patient that patient unlikely to need any chemotherapy based on current characteristics.  However she would benefit from adjuvant radiation. Patient is interested in speaking to radiation oncology regarding even prior to surgery.  We will make a referral to Dr. Donella Stade.    #I again reviewed and discussed the role of endocrine therapy-given ER/PR positive disease postsurgery.  Patient will be offered antihormone pill 1 a day for 5 years.  Discussed potential downsides including but not limited to arthralgias hot flashes and osteoporosis.  Patient had severe hot flashes after her natural menopause.  Discussed that this will also help prevent developing any cancers in the contralateral breast/DCIS recurrence.  #Osteopenia: BMD March 2023- T-score of -1.9.  Monitor for now.  # DISPOSITION: # refer to Dr.Chrystal- next week/prior to surgery/  re: breast cancer-right; DCIS- left # follow up week of July 10th-  MD; no labs-Dr.B

## 2022-06-05 NOTE — Progress Notes (Unsigned)
Patient has occasional sharp pains in left breast.

## 2022-06-11 ENCOUNTER — Encounter
Admission: RE | Admit: 2022-06-11 | Discharge: 2022-06-11 | Disposition: A | Discharge status: Home / Self Care | Payer: Medicare HMO | Source: Ambulatory Visit | Attending: General Surgery | Admitting: General Surgery

## 2022-06-11 DIAGNOSIS — Z01818 Encounter for other preprocedural examination: Secondary | ICD-10-CM

## 2022-06-11 DIAGNOSIS — E1165 Type 2 diabetes mellitus with hyperglycemia: Secondary | ICD-10-CM

## 2022-06-11 DIAGNOSIS — D649 Anemia, unspecified: Secondary | ICD-10-CM

## 2022-06-11 HISTORY — DX: Headache, unspecified: R51.9

## 2022-06-11 HISTORY — DX: Unspecified osteoarthritis, unspecified site: M19.90

## 2022-06-11 HISTORY — DX: Malignant neoplasm of unspecified site of unspecified female breast: C50.919

## 2022-06-11 HISTORY — DX: Hypothyroidism, unspecified: E03.9

## 2022-06-11 HISTORY — DX: Hyperlipidemia, unspecified: E78.5

## 2022-06-11 HISTORY — DX: Obesity, unspecified: E66.9

## 2022-06-11 HISTORY — DX: Calculus of gallbladder without cholecystitis without obstruction: K80.20

## 2022-06-11 NOTE — Patient Instructions (Signed)
Your procedure is scheduled on:06-17-22 Wednesday Report to [facility] as instructed by Dr Dwyane Luo office. Then proceed to the [facility] and check in at the Radiology Desk (2nd desk on right)  REMEMBER: Instructions that are not followed completely may result in serious medical risk, up to and including death; or upon the discretion of your surgeon and anesthesiologist your surgery may need to be rescheduled.  Do not eat food after midnight the night before surgery.  No gum chewing, lozengers or hard candies.  You may however, drink Water up to 2 hours before you are scheduled to arrive for your surgery. Do not drink anything within 2 hours of your scheduled arrival time.  Type 1 and Type 2 diabetics should only drink water.   TAKE THESE MEDICATIONS THE MORNING OF SURGERY WITH A SIP OF WATER: -levothyroxine (SYNTHROID)  -famotidine (PEPCID)   Stop your metFORMIN (GLUCOPHAGE-XR) 2 days prior to surgery-Last dose will be on 06-14-22 Sunday  Take half of your BASAGLAR Insulin (16 units) the night before your surgery and NO insulin the morning of surgery  One week prior to surgery: Stop Anti-inflammatories (NSAIDS) such as Advil, Aleve, Ibuprofen, Motrin, Naproxen, Naprosyn and Aspirin based products such as Excedrin, Goodys Powder, BC Powder.You may however, take Tylenol if needed for pain up until the day of surgery.  Stop ANY OVER THE COUNTER supplements/vitamins NOW (06-11-22) until after surgery.  No Alcohol for 24 hours before or after surgery.  No Smoking including e-cigarettes for 24 hours prior to surgery.  No chewable tobacco products for at least 6 hours prior to surgery.  No nicotine patches on the day of surgery.  Do not use any "recreational" drugs for at least a week prior to your surgery.  Please be advised that the combination of cocaine and anesthesia may have negative outcomes, up to and including death. If you test positive for cocaine, your surgery  will be cancelled.  On the morning of surgery brush your teeth with toothpaste and water, you may rinse your mouth with mouthwash if you wish. Do not swallow any toothpaste or mouthwash.  Use CHG Soap as directed on instruction sheet.  Do not wear jewelry, make-up, hairpins, clips or nail polish.  Do not wear lotions, powders, or perfumes.   Do not shave body from the neck down 48 hours prior to surgery just in case you cut yourself which could leave a site for infection.  Also, freshly shaved skin may become irritated if using the CHG soap.  Contact lenses, hearing aids and dentures may not be worn into surgery.  Do not bring valuables to the hospital. [facility] is not responsible for any missing/lost belongings or valuables.   Notify your doctor if there is any change in your medical condition (cold, fever, infection).  Wear comfortable clothing (specific to your surgery type) to the hospital.  After surgery, you can help prevent lung complications by doing breathing exercises.  Take deep breaths and cough every 1-2 hours. Your doctor may order a device called an Incentive Spirometer to help you take deep breaths. When coughing or sneezing, hold a pillow firmly against your incision with both hands. This is called "splinting." Doing this helps protect your incision. It also decreases belly discomfort.  If you are being admitted to the hospital overnight, leave your suitcase in the car. After surgery it may be brought to your room.  If you are being discharged the day of surgery, you will not be allowed to  drive home. You will need a responsible adult (18 years or older) to drive you home and stay with you that night.   If you are taking public transportation, you will need to have a responsible adult (18 years or older) with you. Please confirm with your physician that it is acceptable to use public transportation.   Please call the [facility] Dept. at [phone] if you have any questions about these instructions.  Surgery Visitation Policy:  Patients undergoing a surgery or procedure may have two family members or support persons with them as long as the person is not COVID-19 positive or experiencing its symptoms.

## 2022-06-12 ENCOUNTER — Encounter
Admission: RE | Admit: 2022-06-12 | Discharge: 2022-06-12 | Disposition: A | Discharge status: Home / Self Care | Payer: Medicare HMO | Source: Ambulatory Visit | Attending: General Surgery | Admitting: General Surgery

## 2022-06-12 DIAGNOSIS — Z01818 Encounter for other preprocedural examination: Secondary | ICD-10-CM | POA: Diagnosis not present

## 2022-06-12 DIAGNOSIS — Z01810 Encounter for preprocedural cardiovascular examination: Secondary | ICD-10-CM | POA: Diagnosis not present

## 2022-06-12 DIAGNOSIS — D649 Anemia, unspecified: Secondary | ICD-10-CM | POA: Diagnosis not present

## 2022-06-12 DIAGNOSIS — E1165 Type 2 diabetes mellitus with hyperglycemia: Secondary | ICD-10-CM | POA: Diagnosis not present

## 2022-06-12 LAB — CBC
HCT: 42.2 % (ref 36.0–46.0)
Hemoglobin: 14.2 g/dL (ref 12.0–15.0)
MCH: 30.8 pg (ref 26.0–34.0)
MCHC: 33.6 g/dL (ref 30.0–36.0)
MCV: 91.5 fL (ref 80.0–100.0)
Platelets: 307 10*3/uL (ref 150–400)
RBC: 4.61 MIL/uL (ref 3.87–5.11)
RDW: 12.5 % (ref 11.5–15.5)
WBC: 10.1 10*3/uL (ref 4.0–10.5)
nRBC: 0 % (ref 0.0–0.2)

## 2022-06-12 LAB — BASIC METABOLIC PANEL
Anion gap: 10 (ref 5–15)
BUN: 14 mg/dL (ref 8–23)
CO2: 24 mmol/L (ref 22–32)
Calcium: 10 mg/dL (ref 8.9–10.3)
Chloride: 103 mmol/L (ref 98–111)
Creatinine, Ser: 1.14 mg/dL — ABNORMAL HIGH (ref 0.44–1.00)
GFR, Estimated: 52 mL/min — ABNORMAL LOW (ref >60)
Glucose, Bld: 127 mg/dL — ABNORMAL HIGH (ref 70–99)
Potassium: 4.1 mmol/L (ref 3.5–5.1)
Sodium: 137 mmol/L (ref 135–145)

## 2022-06-16 MED ORDER — CHLORHEXIDINE GLUCONATE CLOTH 2 % EX PADS: 6.0000 | MEDICATED_PAD | Freq: Once | CUTANEOUS | Status: DC

## 2022-06-16 MED ORDER — ORAL CARE MOUTH RINSE: 15.0000 mL | Freq: Once | OROMUCOSAL | Status: AC

## 2022-06-16 MED ORDER — CHLORHEXIDINE GLUCONATE 0.12 % MT SOLN: 15.0000 mL | Freq: Once | OROMUCOSAL | Status: AC

## 2022-06-16 MED ORDER — SODIUM CHLORIDE 0.9 % IV SOLN: INTRAVENOUS | Status: DC

## 2022-06-16 MED ORDER — CEFAZOLIN SODIUM-DEXTROSE 2-4 GM/100ML-% IV SOLN
2.0000 g | INTRAVENOUS | Status: AC
  Administered 2022-06-17: 2 g via INTRAVENOUS

## 2022-06-17 ENCOUNTER — Ambulatory Visit
Admission: RE | Admit: 2022-06-17 | Discharge: 2022-06-17 | Disposition: A | Discharge status: Home / Self Care | Payer: Medicare HMO | Source: Ambulatory Visit | Attending: General Surgery | Admitting: General Surgery

## 2022-06-17 ENCOUNTER — Encounter: Payer: Self-pay | Admitting: General Surgery

## 2022-06-17 ENCOUNTER — Ambulatory Visit: Payer: Medicare HMO

## 2022-06-17 ENCOUNTER — Other Ambulatory Visit: Payer: Self-pay

## 2022-06-17 ENCOUNTER — Encounter
Admission: RE | Disposition: A | Discharge status: Home / Self Care | Payer: Self-pay | Source: Home / Self Care | Attending: General Surgery

## 2022-06-17 ENCOUNTER — Ambulatory Visit: Payer: Medicare HMO | Admitting: Anesthesiology

## 2022-06-17 ENCOUNTER — Ambulatory Visit
Admission: RE | Admit: 2022-06-17 | Discharge: 2022-06-17 | Disposition: A | Discharge status: Home / Self Care | Payer: Medicare HMO | Attending: General Surgery | Admitting: General Surgery

## 2022-06-17 DIAGNOSIS — E039 Hypothyroidism, unspecified: Secondary | ICD-10-CM | POA: Diagnosis not present

## 2022-06-17 DIAGNOSIS — Z79899 Other long term (current) drug therapy: Secondary | ICD-10-CM | POA: Diagnosis not present

## 2022-06-17 DIAGNOSIS — C50411 Malignant neoplasm of upper-outer quadrant of right female breast: Secondary | ICD-10-CM

## 2022-06-17 DIAGNOSIS — E119 Type 2 diabetes mellitus without complications: Secondary | ICD-10-CM | POA: Diagnosis not present

## 2022-06-17 DIAGNOSIS — C50911 Malignant neoplasm of unspecified site of right female breast: Secondary | ICD-10-CM | POA: Diagnosis not present

## 2022-06-17 DIAGNOSIS — D0581 Other specified type of carcinoma in situ of right breast: Secondary | ICD-10-CM | POA: Diagnosis not present

## 2022-06-17 DIAGNOSIS — K219 Gastro-esophageal reflux disease without esophagitis: Secondary | ICD-10-CM | POA: Diagnosis not present

## 2022-06-17 DIAGNOSIS — Z7984 Long term (current) use of oral hypoglycemic drugs: Secondary | ICD-10-CM | POA: Insufficient documentation

## 2022-06-17 DIAGNOSIS — D0512 Intraductal carcinoma in situ of left breast: Secondary | ICD-10-CM | POA: Diagnosis not present

## 2022-06-17 DIAGNOSIS — Z6841 Body Mass Index (BMI) 40.0 and over, adult: Secondary | ICD-10-CM | POA: Insufficient documentation

## 2022-06-17 DIAGNOSIS — Z79890 Hormone replacement therapy (postmenopausal): Secondary | ICD-10-CM | POA: Insufficient documentation

## 2022-06-17 DIAGNOSIS — E785 Hyperlipidemia, unspecified: Secondary | ICD-10-CM | POA: Diagnosis not present

## 2022-06-17 DIAGNOSIS — C50912 Malignant neoplasm of unspecified site of left female breast: Secondary | ICD-10-CM | POA: Diagnosis not present

## 2022-06-17 DIAGNOSIS — C50919 Malignant neoplasm of unspecified site of unspecified female breast: Secondary | ICD-10-CM

## 2022-06-17 DIAGNOSIS — D0511 Intraductal carcinoma in situ of right breast: Secondary | ICD-10-CM | POA: Diagnosis present

## 2022-06-17 DIAGNOSIS — Z794 Long term (current) use of insulin: Secondary | ICD-10-CM | POA: Insufficient documentation

## 2022-06-17 DIAGNOSIS — Z01818 Encounter for other preprocedural examination: Secondary | ICD-10-CM

## 2022-06-17 HISTORY — PX: BREAST LUMPECTOMY WITH NEEDLE LOCALIZATION: SHX5759

## 2022-06-17 HISTORY — PX: AXILLARY SENTINEL NODE BIOPSY: SHX5738

## 2022-06-17 LAB — GLUCOSE, CAPILLARY
Glucose-Capillary: 238 mg/dL — ABNORMAL HIGH (ref 70–99)
Glucose-Capillary: 183 mg/dL — ABNORMAL HIGH (ref 70–99)
Glucose-Capillary: 218 mg/dL — ABNORMAL HIGH (ref 70–99)

## 2022-06-17 IMAGING — MG MM BREAST SURGICAL SPECIMEN
1 series · 1 of 1 positions shown · non-contrast
Comparison: Previous examinations.

CLINICAL DATA: Right lumpectomy for invasive mammary carcinoma with
lobular features marked with a coil shaped biopsy marker clip.

EXAM:
SPECIMEN RADIOGRAPH OF THE RIGHT BREAST

[R]
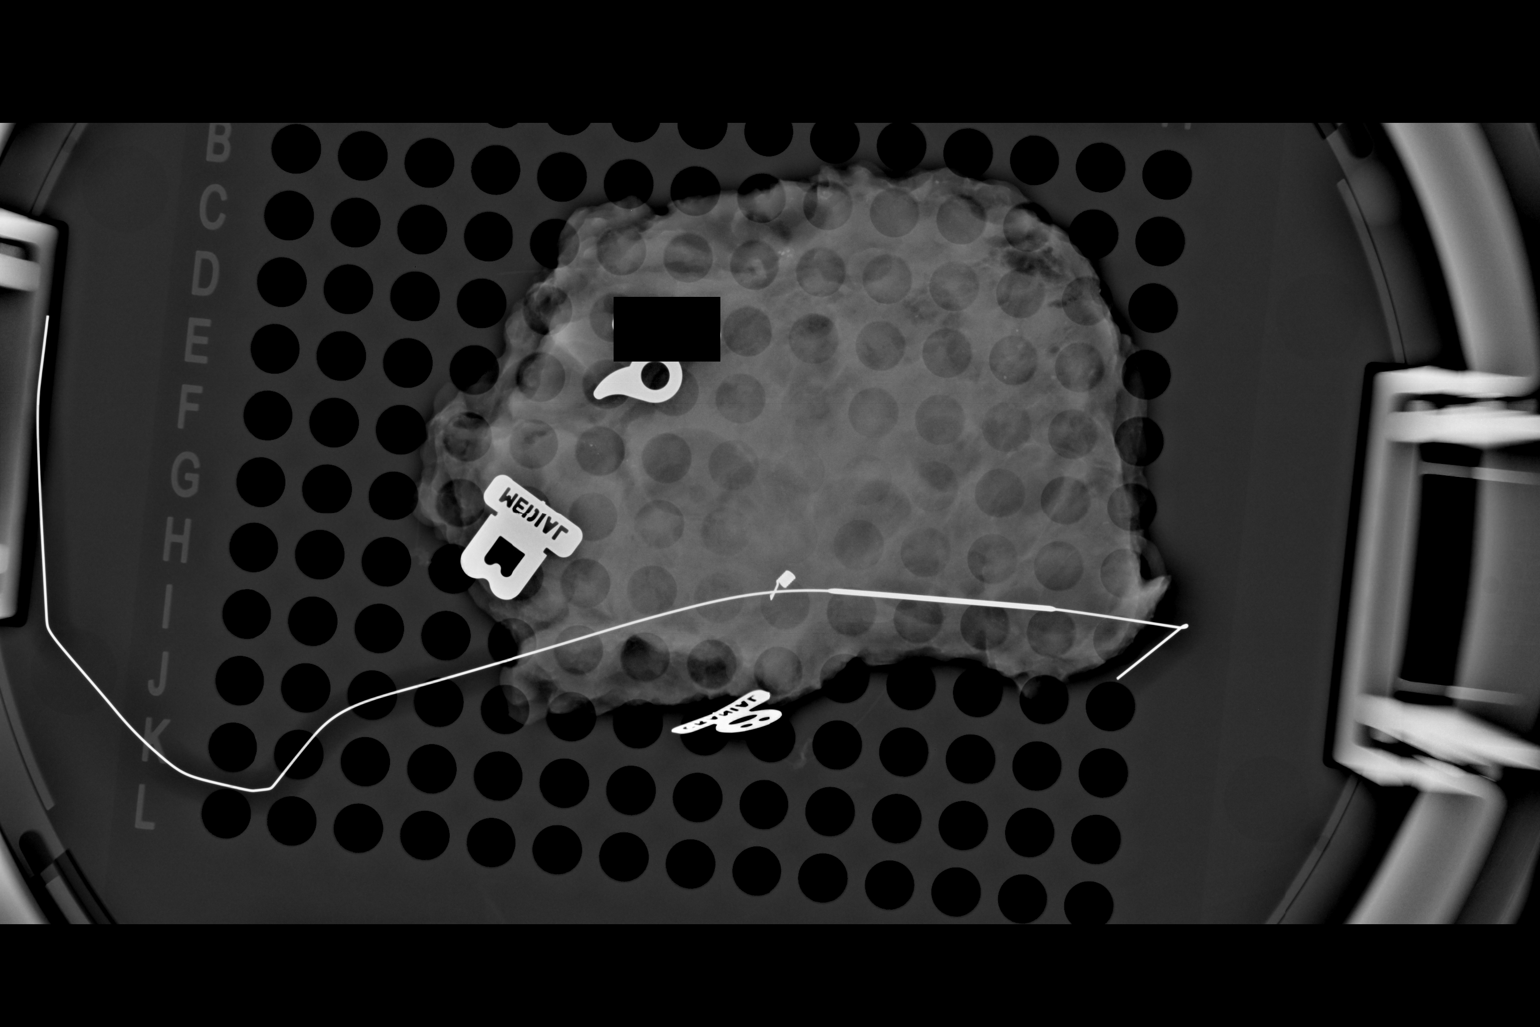

[1 of 1 positions shown; findings below may reference images not displayed]

FINDINGS: Status post excision of the right breast. The wire tip and coil
shaped biopsy marker clip are present and intact.
IMPRESSION: Specimen radiograph of the right breast.

## 2022-06-17 IMAGING — MG MM BREAST SURGICAL SPECIMEN
1 series · 1 of 1 positions shown · non-contrast
Comparison: Previous examinations.

CLINICAL DATA: Status post left lumpectomy for DCIS marked with a
barbell shaped biopsy marker clip.

EXAM:
SPECIMEN RADIOGRAPH OF THE LEFT BREAST

[L]
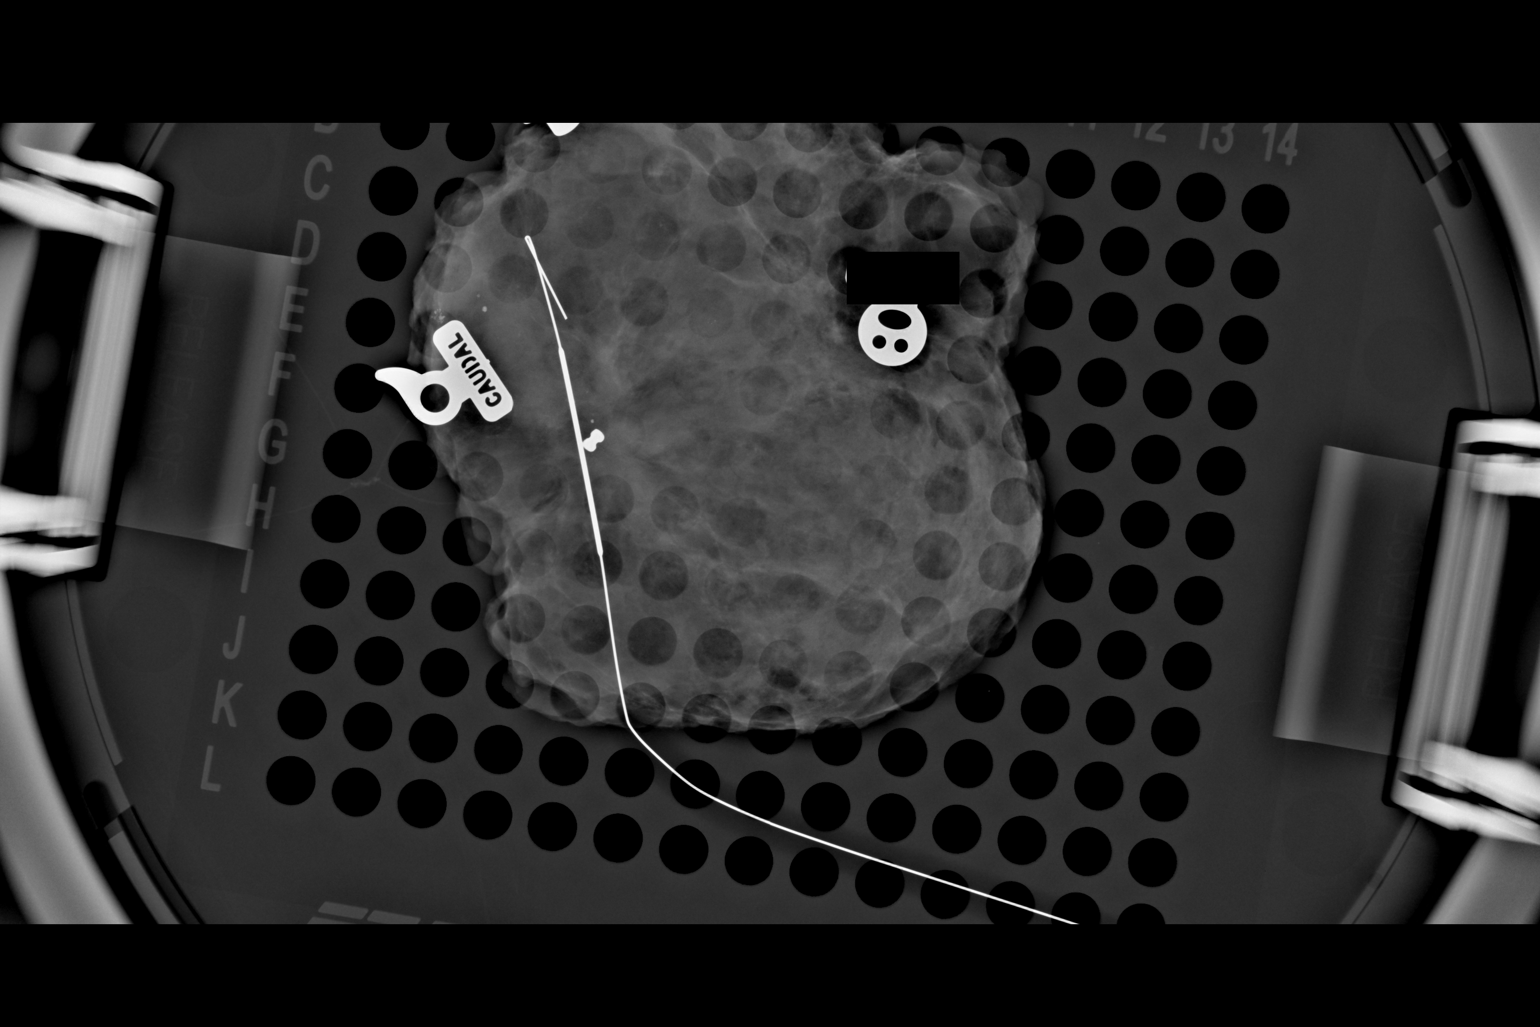

[1 of 1 positions shown; findings below may reference images not displayed]

FINDINGS: Status post excision of the left breast. The wire tip and barbell
shaped biopsy marker clip are present and are intact.
IMPRESSION: Specimen radiograph of the left breast.

## 2022-06-17 IMAGING — MG MM PLC BREAST LOC DEV 1ST LESION INC*R*
4 series · 4 of 4 positions shown · non-contrast
Comparison: None Available.

CLINICAL DATA: Pre lumpectomy localization of recently diagnosed
left breast DCIS marked with a barbell shaped clip and right breast
invasive mammary carcinoma with lobular features marked with a coil
shaped clip.

EXAM:
NEEDLE LOCALIZATION OF THE BILATERAL BREAST WITH MAMMO GUIDANCE

[R ML (1 of 2)]
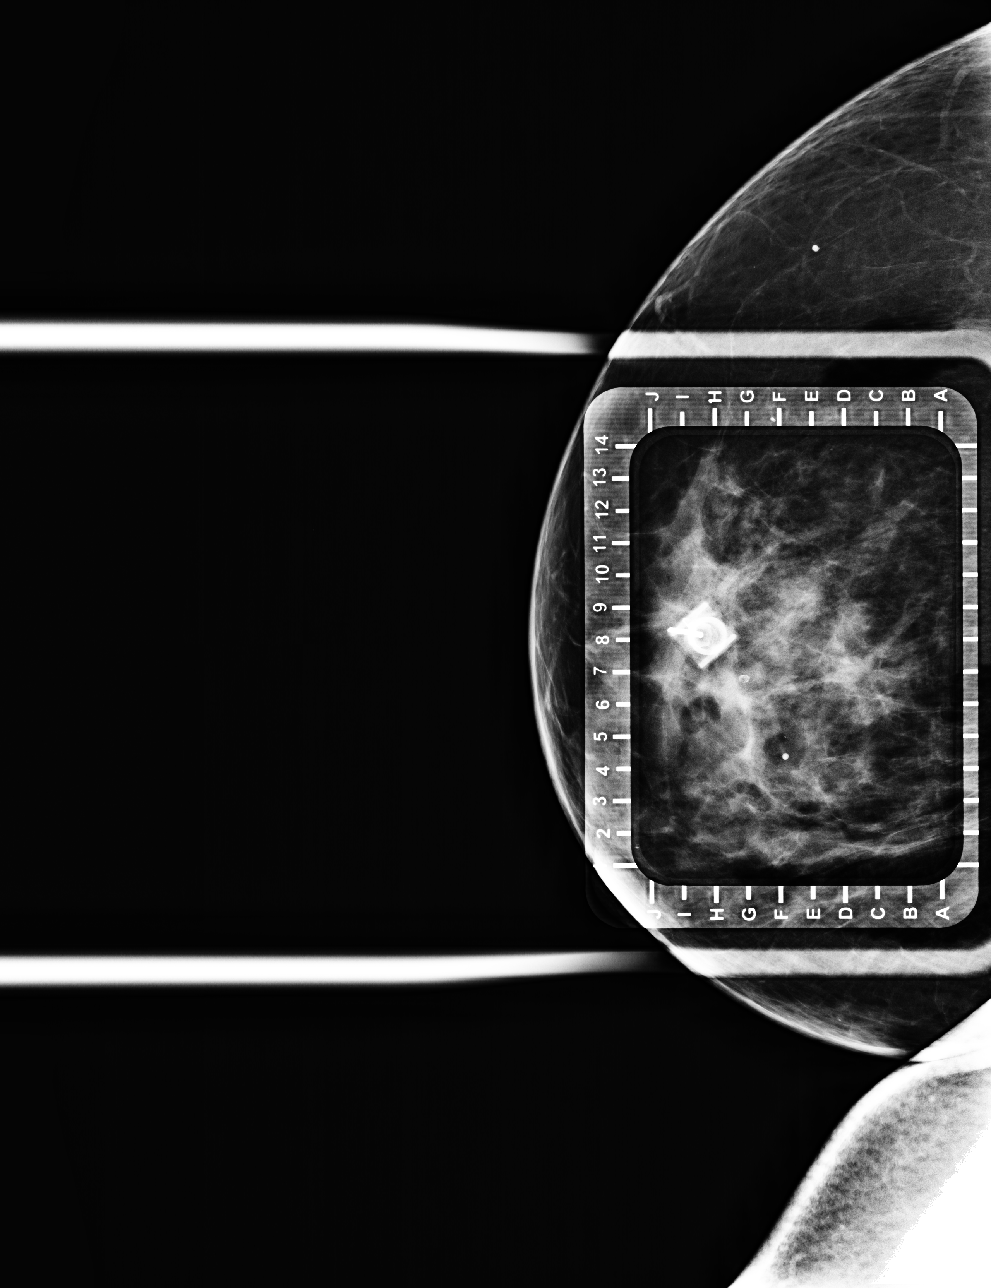

[R ML (2 of 2)]
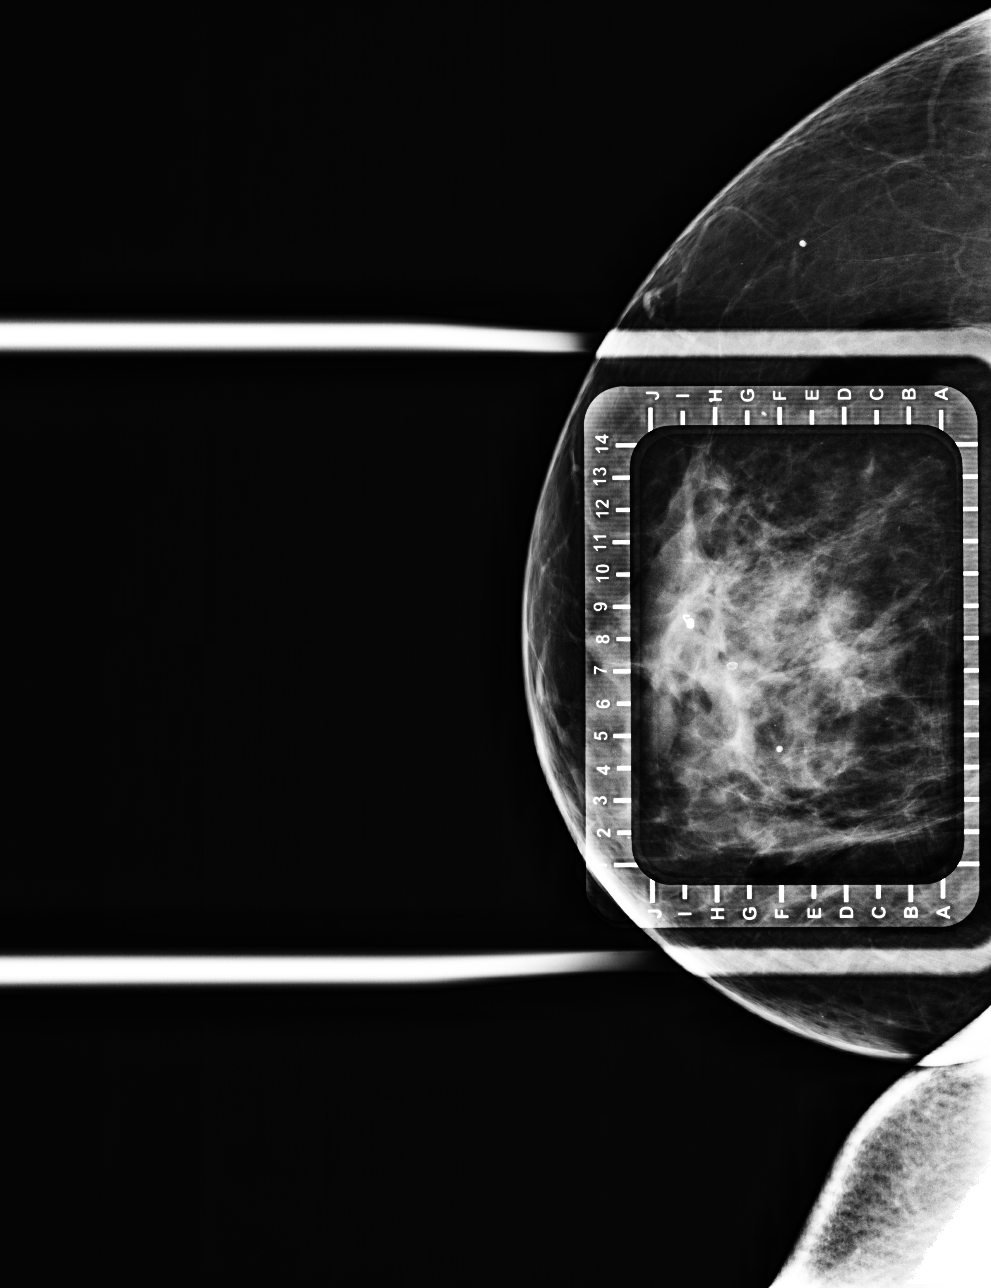

[R CC (1 of 2)]
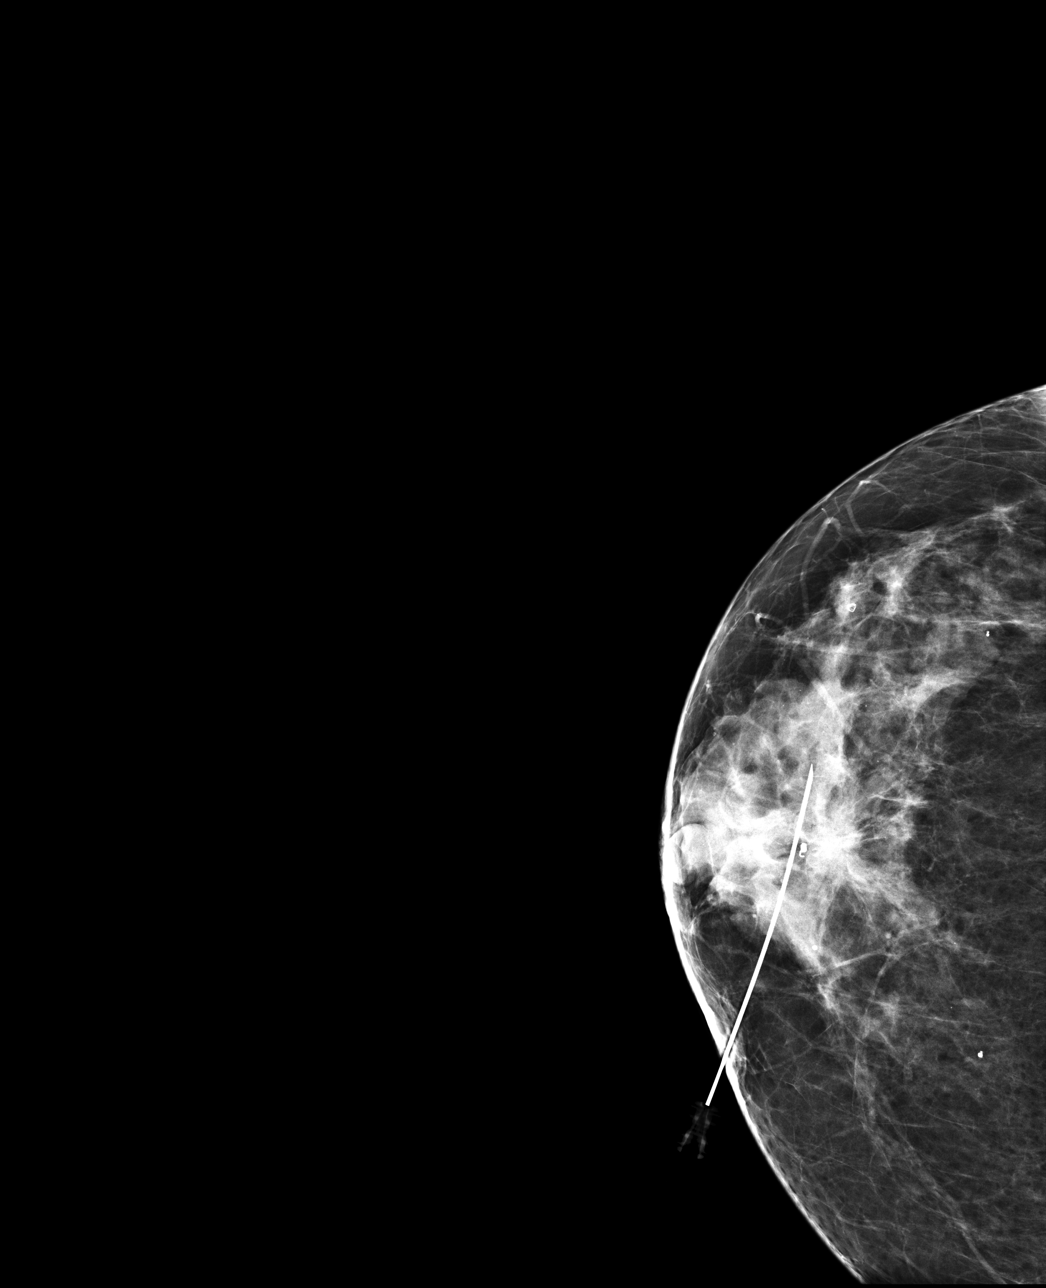

[R CC (2 of 2)]
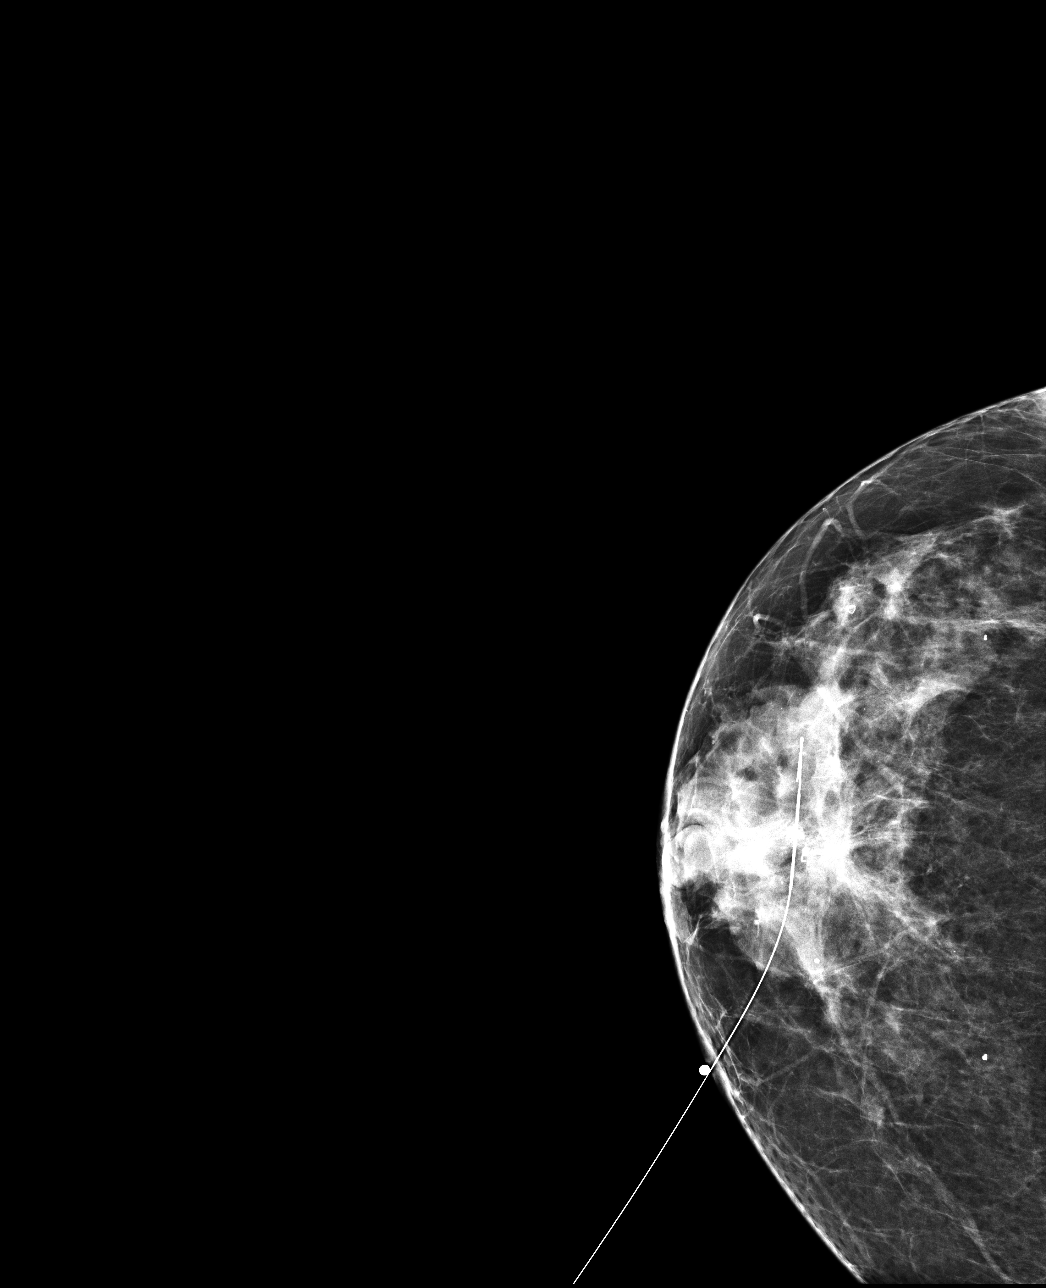

[4 of 4 positions shown; findings below may reference images not displayed]

PROCEDURE:
Patient presents for needle localization prior to bilateral
lumpectomies. I met with the patient and we discussed the procedure
of needle localization including benefits and alternatives. We
discussed the high likelihood of successful procedures. We discussed
the risks of the procedures, including infection, bleeding, tissue
injury, and further surgery. Informed, written consent was given.
The usual time-out protocol was performed immediately prior to the
procedures.

LEFT BREAST

Using mammographic guidance, sterile technique, 1% lidocaine and a 5
cm modified Kopans needle, the recently placed barbell shaped clip
in the outer left breast was localized using lateral approach. The
images were marked for Dr. CIVATON.

RIGHT BREAST

Using mammographic guidance, sterile technique, 1% lidocaine and a 7
cm modified Kopans needle, the recently placed coil clip in the
slightly lateral anterior right breast was localized using a medial
approach. The images were marked for Dr. CIVATON.
IMPRESSION: Needle localization both breasts.  No apparent complications.

## 2022-06-17 IMAGING — MG MM PLC BREAST LOC DEV 1ST LESION INC MAMMO GUIDE*L*
5 series · 5 of 5 positions shown · non-contrast
Comparison: None Available.

CLINICAL DATA: Pre lumpectomy localization of recently diagnosed
left breast DCIS marked with a barbell shaped clip and right breast
invasive mammary carcinoma with lobular features marked with a coil
shaped clip.

EXAM:
NEEDLE LOCALIZATION OF THE BILATERAL BREAST WITH MAMMO GUIDANCE

[L CC (1 of 3)]
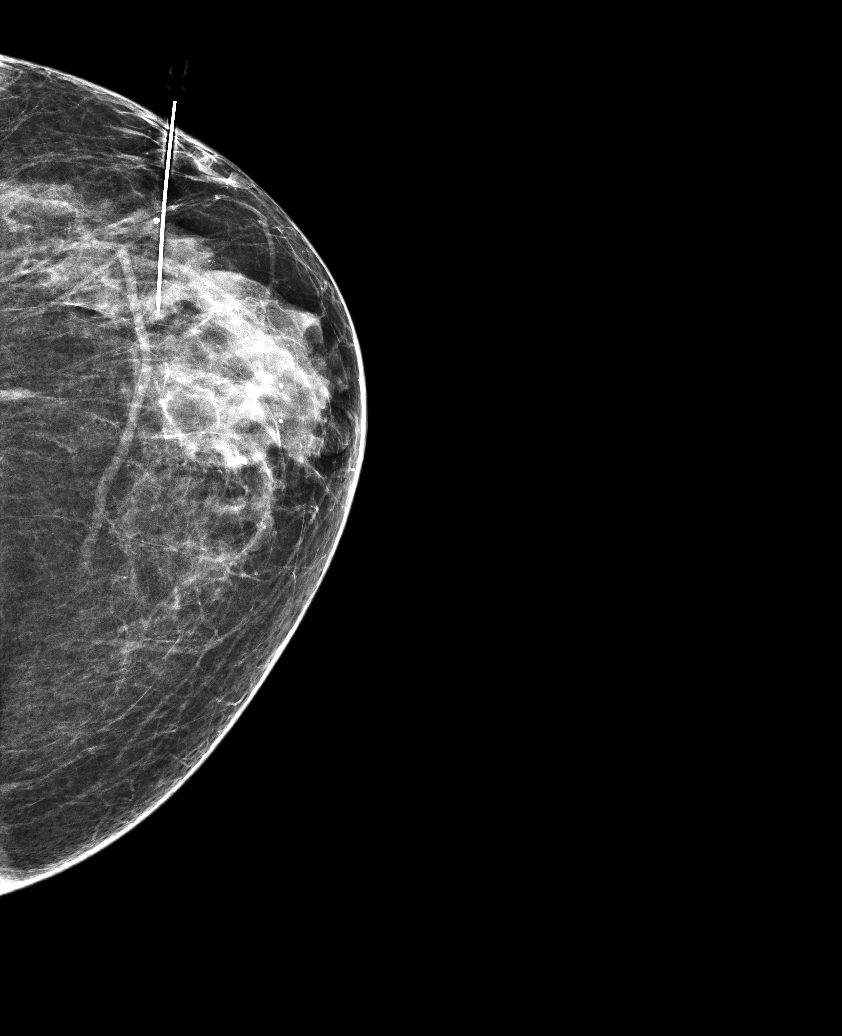

[L CC (2 of 3)]
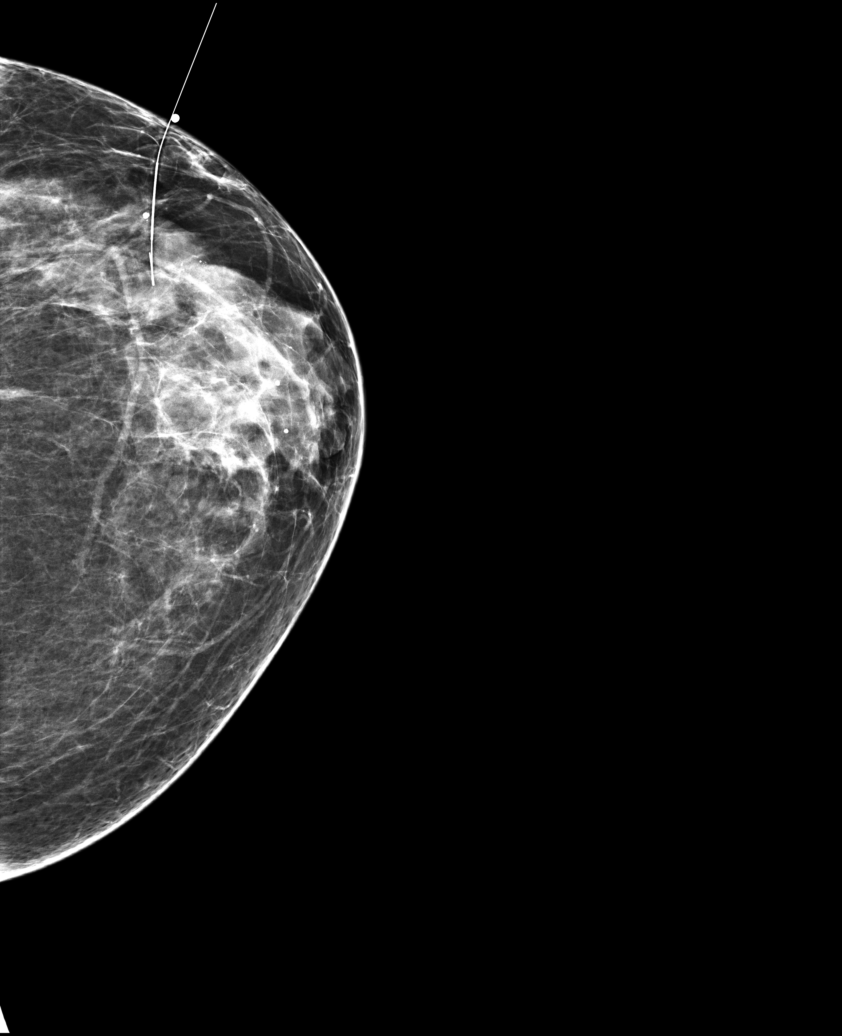

[L LM (1 of 2)]
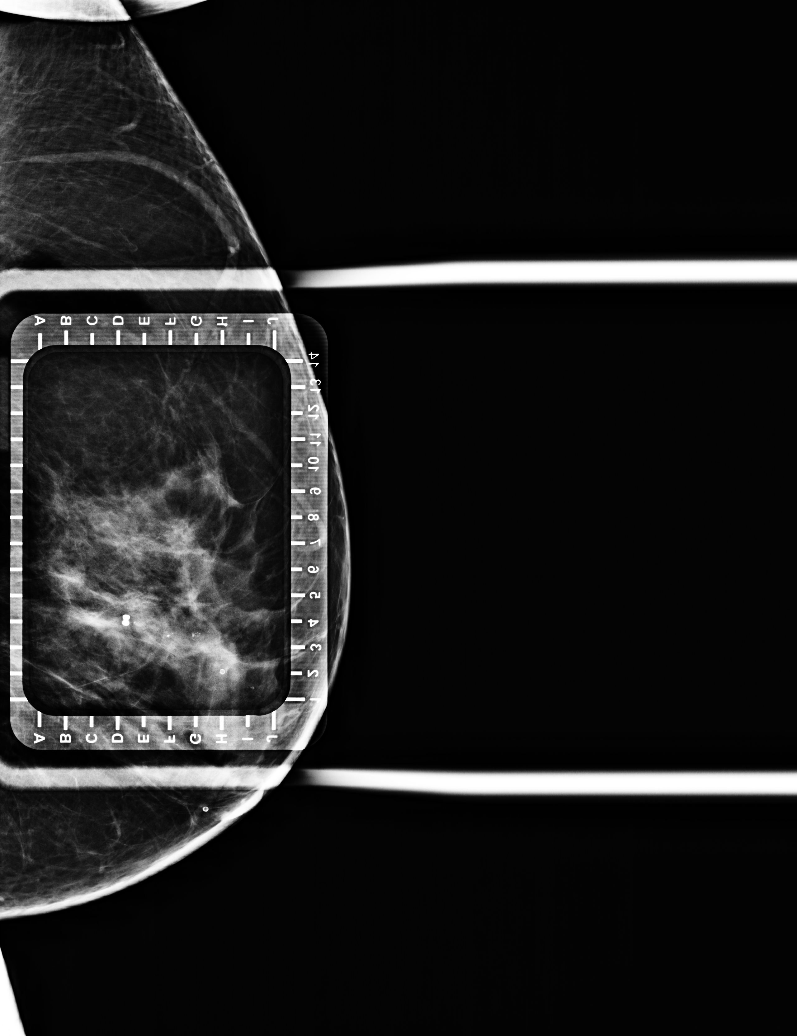

[L LM (2 of 2)]
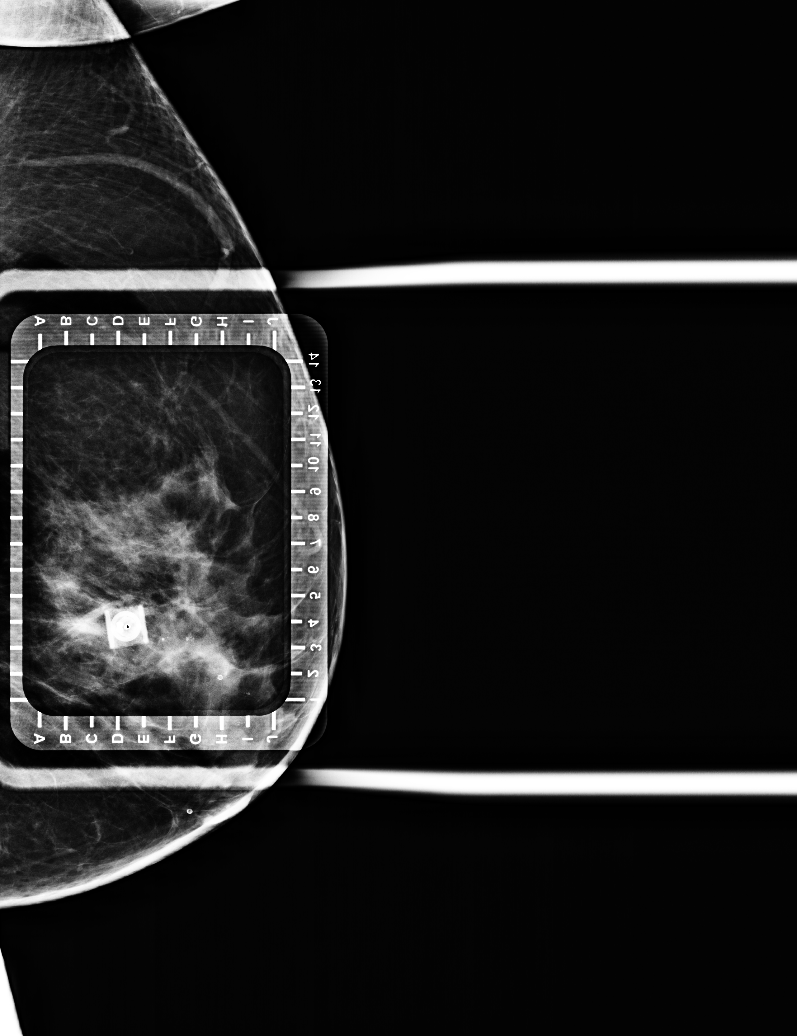

[L CC (3 of 3)]
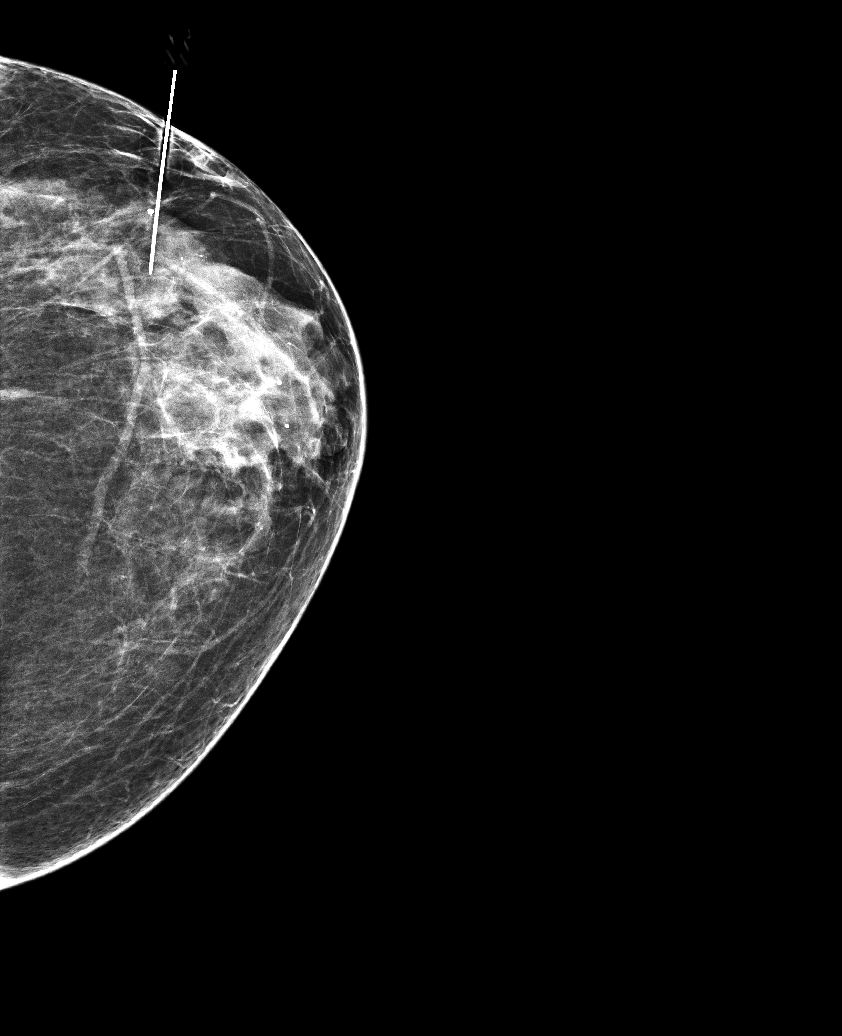

[5 of 5 positions shown; findings below may reference images not displayed]

PROCEDURE:
Patient presents for needle localization prior to bilateral
lumpectomies. I met with the patient and we discussed the procedure
of needle localization including benefits and alternatives. We
discussed the high likelihood of successful procedures. We discussed
the risks of the procedures, including infection, bleeding, tissue
injury, and further surgery. Informed, written consent was given.
The usual time-out protocol was performed immediately prior to the
procedures.

LEFT BREAST

Using mammographic guidance, sterile technique, 1% lidocaine and a 5
cm modified Kopans needle, the recently placed barbell shaped clip
in the outer left breast was localized using lateral approach. The
images were marked for Dr. CIVATON.

RIGHT BREAST

Using mammographic guidance, sterile technique, 1% lidocaine and a 7
cm modified Kopans needle, the recently placed coil clip in the
slightly lateral anterior right breast was localized using a medial
approach. The images were marked for Dr. CIVATON.
IMPRESSION: Needle localization both breasts.  No apparent complications.

## 2022-06-17 SURGERY — BREAST LUMPECTOMY WITH NEEDLE LOCALIZATION
Anesthesia: General | Laterality: Right

## 2022-06-17 MED ORDER — BUPIVACAINE-EPINEPHRINE (PF) 0.5% -1:200000 IJ SOLN
INTRAMUSCULAR | Status: DC | PRN
  Administered 2022-06-17 (×2): 10 mL

## 2022-06-17 MED ORDER — DEXMEDETOMIDINE HCL IN NACL 200 MCG/50ML IV SOLN
INTRAVENOUS | Status: DC | PRN
  Administered 2022-06-17: 12 ug via INTRAVENOUS
  Administered 2022-06-17 (×2): 4 ug via INTRAVENOUS
  Administered 2022-06-17: 8 ug via INTRAVENOUS

## 2022-06-17 MED ORDER — KETOROLAC TROMETHAMINE 30 MG/ML IJ SOLN
INTRAMUSCULAR | Status: DC | PRN
  Administered 2022-06-17: 15 mg via INTRAVENOUS

## 2022-06-17 MED ORDER — PROPOFOL 10 MG/ML IV BOLUS
INTRAVENOUS | Status: DC | PRN
  Administered 2022-06-17: 150 mg via INTRAVENOUS
  Administered 2022-06-17: 20 mg via INTRAVENOUS
  Administered 2022-06-17: 30 mg via INTRAVENOUS

## 2022-06-17 MED ORDER — MIDAZOLAM HCL 2 MG/2ML IJ SOLN
INTRAMUSCULAR | Status: AC
  Filled 2022-06-17: qty 2

## 2022-06-17 MED ORDER — PHENYLEPHRINE HCL-NACL 20-0.9 MG/250ML-% IV SOLN
INTRAVENOUS | Status: DC | PRN
  Administered 2022-06-17: 20 ug/min via INTRAVENOUS

## 2022-06-17 MED ORDER — FENTANYL CITRATE (PF) 100 MCG/2ML IJ SOLN
25.0000 ug | INTRAMUSCULAR | Status: AC | PRN
  Administered 2022-06-17: 50 ug via INTRAVENOUS
  Administered 2022-06-17 (×3): 25 ug via INTRAVENOUS

## 2022-06-17 MED ORDER — ONDANSETRON HCL 4 MG/2ML IJ SOLN
INTRAMUSCULAR | Status: DC | PRN
  Administered 2022-06-17: 4 mg via INTRAVENOUS

## 2022-06-17 MED ORDER — PHENYLEPHRINE 80 MCG/ML (10ML) SYRINGE FOR IV PUSH (FOR BLOOD PRESSURE SUPPORT)
PREFILLED_SYRINGE | INTRAVENOUS | Status: AC
  Filled 2022-06-17: qty 10

## 2022-06-17 MED ORDER — FENTANYL CITRATE (PF) 100 MCG/2ML IJ SOLN
INTRAMUSCULAR | Status: DC | PRN
  Administered 2022-06-17 (×2): 50 ug via INTRAVENOUS

## 2022-06-17 MED ORDER — GLYCOPYRROLATE 0.2 MG/ML IJ SOLN
INTRAMUSCULAR | Status: AC
  Filled 2022-06-17: qty 1

## 2022-06-17 MED ORDER — OXYCODONE HCL 5 MG/5ML PO SOLN: 5.0000 mg | Freq: Once | ORAL | Status: AC | PRN

## 2022-06-17 MED ORDER — DEXAMETHASONE SODIUM PHOSPHATE 10 MG/ML IJ SOLN
INTRAMUSCULAR | Status: AC
  Filled 2022-06-17: qty 1

## 2022-06-17 MED ORDER — FENTANYL CITRATE (PF) 100 MCG/2ML IJ SOLN
INTRAMUSCULAR | Status: AC
  Administered 2022-06-17: 25 ug via INTRAVENOUS
  Filled 2022-06-17: qty 2

## 2022-06-17 MED ORDER — HYDROCODONE-ACETAMINOPHEN 5-325 MG PO TABS: 1.0000 | ORAL_TABLET | ORAL | 0 refills | Status: DC | PRN

## 2022-06-17 MED ORDER — ONDANSETRON HCL 4 MG/2ML IJ SOLN
INTRAMUSCULAR | Status: AC
  Filled 2022-06-17: qty 2

## 2022-06-17 MED ORDER — GLYCOPYRROLATE 0.2 MG/ML IJ SOLN
INTRAMUSCULAR | Status: DC | PRN
  Administered 2022-06-17: .2 mg via INTRAVENOUS

## 2022-06-17 MED ORDER — PROMETHAZINE HCL 25 MG/ML IJ SOLN: 6.2500 mg | INTRAMUSCULAR | Status: DC | PRN

## 2022-06-17 MED ORDER — PHENYLEPHRINE 80 MCG/ML (10ML) SYRINGE FOR IV PUSH (FOR BLOOD PRESSURE SUPPORT)
PREFILLED_SYRINGE | INTRAVENOUS | Status: DC | PRN
  Administered 2022-06-17 (×5): 160 ug via INTRAVENOUS

## 2022-06-17 MED ORDER — FENTANYL CITRATE (PF) 100 MCG/2ML IJ SOLN
INTRAMUSCULAR | Status: AC
  Filled 2022-06-17: qty 2

## 2022-06-17 MED ORDER — DEXAMETHASONE SODIUM PHOSPHATE 10 MG/ML IJ SOLN
INTRAMUSCULAR | Status: DC | PRN
  Administered 2022-06-17: 5 mg via INTRAVENOUS

## 2022-06-17 MED ORDER — ACETAMINOPHEN 500 MG PO TABS
ORAL_TABLET | ORAL | Status: AC
  Filled 2022-06-17: qty 2

## 2022-06-17 MED ORDER — KETOROLAC TROMETHAMINE 30 MG/ML IJ SOLN
INTRAMUSCULAR | Status: AC
  Filled 2022-06-17: qty 1

## 2022-06-17 MED ORDER — OXYCODONE HCL 5 MG PO TABS
ORAL_TABLET | ORAL | Status: AC
  Administered 2022-06-17: 5 mg via ORAL
  Filled 2022-06-17: qty 1

## 2022-06-17 MED ORDER — DROPERIDOL 2.5 MG/ML IJ SOLN: 0.6250 mg | Freq: Once | INTRAMUSCULAR | Status: DC | PRN

## 2022-06-17 MED ORDER — ACETAMINOPHEN 500 MG PO TABS
1000.0000 mg | ORAL_TABLET | Freq: Once | ORAL | Status: AC
  Administered 2022-06-17: 1000 mg via ORAL

## 2022-06-17 MED ORDER — INSULIN ASPART 100 UNIT/ML IJ SOLN
INTRAMUSCULAR | Status: AC
  Administered 2022-06-17: 5 [IU] via SUBCUTANEOUS
  Filled 2022-06-17: qty 1

## 2022-06-17 MED ORDER — ACETAMINOPHEN 10 MG/ML IV SOLN: 1000.0000 mg | Freq: Once | INTRAVENOUS | Status: DC | PRN

## 2022-06-17 MED ORDER — SUCCINYLCHOLINE CHLORIDE 200 MG/10ML IV SOSY
PREFILLED_SYRINGE | INTRAVENOUS | Status: DC | PRN
  Administered 2022-06-17: 140 mg via INTRAVENOUS

## 2022-06-17 MED ORDER — OXYCODONE HCL 5 MG PO TABS: 5.0000 mg | ORAL_TABLET | Freq: Once | ORAL | Status: AC | PRN

## 2022-06-17 MED ORDER — METHYLENE BLUE 1 % INJ SOLN
INTRAVENOUS | Status: AC
  Filled 2022-06-17: qty 10

## 2022-06-17 MED ORDER — TECHNETIUM TC 99M TILMANOCEPT KIT
1.0000 | PACK | Freq: Once | INTRAVENOUS | Status: AC | PRN
  Administered 2022-06-17: 1.033 via INTRADERMAL

## 2022-06-17 MED ORDER — DEXMEDETOMIDINE (PRECEDEX) IN NS 20 MCG/5ML (4 MCG/ML) IV SYRINGE: PREFILLED_SYRINGE | INTRAVENOUS | Status: DC | PRN

## 2022-06-17 MED ORDER — BUPIVACAINE-EPINEPHRINE (PF) 0.5% -1:200000 IJ SOLN
INTRAMUSCULAR | Status: AC
  Filled 2022-06-17: qty 30

## 2022-06-17 MED ORDER — FENTANYL CITRATE (PF) 100 MCG/2ML IJ SOLN
INTRAMUSCULAR | Status: AC
  Administered 2022-06-17: 50 ug via INTRAVENOUS
  Filled 2022-06-17: qty 2

## 2022-06-17 MED ORDER — SUCCINYLCHOLINE CHLORIDE 200 MG/10ML IV SOSY
PREFILLED_SYRINGE | INTRAVENOUS | Status: AC
  Filled 2022-06-17: qty 10

## 2022-06-17 MED ORDER — MIDAZOLAM HCL 2 MG/2ML IJ SOLN
INTRAMUSCULAR | Status: DC | PRN
  Administered 2022-06-17: 2 mg via INTRAVENOUS

## 2022-06-17 MED ORDER — LIDOCAINE HCL (CARDIAC) PF 100 MG/5ML IV SOSY
PREFILLED_SYRINGE | INTRAVENOUS | Status: DC | PRN
  Administered 2022-06-17: 50 mg via INTRAVENOUS

## 2022-06-17 MED ORDER — CHLORHEXIDINE GLUCONATE 0.12 % MT SOLN
OROMUCOSAL | Status: AC
  Administered 2022-06-17: 15 mL via OROMUCOSAL
  Filled 2022-06-17: qty 15

## 2022-06-17 MED ORDER — CELECOXIB 200 MG PO CAPS
200.0000 mg | ORAL_CAPSULE | Freq: Once | ORAL | Status: AC
  Administered 2022-06-17: 200 mg via ORAL

## 2022-06-17 MED ORDER — LACTATED RINGERS IV SOLN: INTRAVENOUS | Status: DC | PRN

## 2022-06-17 MED ORDER — CELECOXIB 200 MG PO CAPS
ORAL_CAPSULE | ORAL | Status: AC
  Filled 2022-06-17: qty 1

## 2022-06-17 MED ORDER — INSULIN ASPART 100 UNIT/ML IJ SOLN: 5.0000 [IU] | Freq: Once | INTRAMUSCULAR | Status: AC

## 2022-06-17 MED ORDER — PHENYLEPHRINE HCL-NACL 20-0.9 MG/250ML-% IV SOLN
INTRAVENOUS | Status: AC
  Filled 2022-06-17: qty 250

## 2022-06-17 MED ORDER — CEFAZOLIN SODIUM-DEXTROSE 2-4 GM/100ML-% IV SOLN
INTRAVENOUS | Status: AC
  Filled 2022-06-17: qty 100

## 2022-06-17 SURGICAL SUPPLY — 50 items
BLADE SURG 15 STRL SS SAFETY (BLADE) ×6 IMPLANT
CHLORAPREP W/TINT 26 (MISCELLANEOUS) ×3 IMPLANT
COVER PROBE FLX POLY STRL (MISCELLANEOUS) ×3 IMPLANT
DEVICE DUBIN SPECIMEN MAMMOGRA (MISCELLANEOUS) ×4 IMPLANT
DRAPE LAPAROTOMY TRNSV 106X77 (MISCELLANEOUS) ×3 IMPLANT
DRSG GAUZE FLUFF 36X18 (GAUZE/BANDAGES/DRESSINGS) ×6 IMPLANT
DRSG TELFA 3X8 NADH (GAUZE/BANDAGES/DRESSINGS) ×9 IMPLANT
ELECT CAUTERY BLADE TIP 2.5 (TIP) ×3
ELECT REM PT RETURN 9FT ADLT (ELECTROSURGICAL) ×3
ELECTRODE CAUTERY BLDE TIP 2.5 (TIP) ×2 IMPLANT
ELECTRODE REM PT RTRN 9FT ADLT (ELECTROSURGICAL) ×2 IMPLANT
GAUZE 4X4 16PLY ~~LOC~~+RFID DBL (SPONGE) ×3 IMPLANT
GLOVE BIO SURGEON STRL SZ7.5 (GLOVE) ×4 IMPLANT
GLOVE SURG UNDER LTX SZ8 (GLOVE) ×3 IMPLANT
GOWN STRL REUS W/ TWL LRG LVL3 (GOWN DISPOSABLE) ×4 IMPLANT
GOWN STRL REUS W/TWL LRG LVL3 (GOWN DISPOSABLE) ×3
KIT TURNOVER KIT A (KITS) ×3 IMPLANT
LABEL OR SOLS (LABEL) ×3 IMPLANT
MANIFOLD NEPTUNE II (INSTRUMENTS) ×3 IMPLANT
MARGIN MAP 10MM (MISCELLANEOUS) ×3 IMPLANT
NDL HYPO 25X1 1.5 SAFETY (NEEDLE) ×4 IMPLANT
NDL SPNL 20GX3.5 QUINCKE YW (NEEDLE) IMPLANT
NEEDLE HYPO 22GX1.5 SAFETY (NEEDLE) ×3 IMPLANT
NEEDLE HYPO 25X1 1.5 SAFETY (NEEDLE) ×6 IMPLANT
NEEDLE SPNL 20GX3.5 QUINCKE YW (NEEDLE) IMPLANT
PACK BASIN MINOR ARMC (MISCELLANEOUS) ×3 IMPLANT
PAD DRESSING TELFA 3X8 NADH (GAUZE/BANDAGES/DRESSINGS) ×2 IMPLANT
PENCIL ELECTRO HAND CTR (MISCELLANEOUS) ×3 IMPLANT
RETRACTOR RING XSMALL (MISCELLANEOUS) IMPLANT
RTRCTR WOUND ALEXIS 13CM XS SH (MISCELLANEOUS) ×3
SLEVE PROBE SENORX GAMMA FIND (MISCELLANEOUS) ×1 IMPLANT
STRIP CLOSURE SKIN 1/2X4 (GAUZE/BANDAGES/DRESSINGS) ×5 IMPLANT
SUT ETHILON 3-0 FS-10 30 BLK (SUTURE) ×6
SUT SILK 2 0 (SUTURE) ×1
SUT SILK 2-0 18XBRD TIE 12 (SUTURE) ×2 IMPLANT
SUT VIC AB 2-0 CT1 27 (SUTURE) ×4
SUT VIC AB 2-0 CT1 TAPERPNT 27 (SUTURE) ×4 IMPLANT
SUT VIC AB 3-0 54X BRD REEL (SUTURE) ×2 IMPLANT
SUT VIC AB 3-0 BRD 54 (SUTURE) ×1
SUT VIC AB 3-0 SH 27 (SUTURE) ×1
SUT VIC AB 3-0 SH 27X BRD (SUTURE) ×4 IMPLANT
SUT VIC AB 4-0 FS2 27 (SUTURE) ×6 IMPLANT
SUTURE EHLN 3-0 FS-10 30 BLK (SUTURE) ×2 IMPLANT
SWABSTK COMLB BENZOIN TINCTURE (MISCELLANEOUS) ×3 IMPLANT
SYR 10ML LL (SYRINGE) ×3 IMPLANT
SYR BULB IRRIG 60ML STRL (SYRINGE) ×3 IMPLANT
TAPE TRANSPORE STRL 2 31045 (GAUZE/BANDAGES/DRESSINGS) ×3 IMPLANT
TRAP NEPTUNE SPECIMEN COLLECT (MISCELLANEOUS) ×3 IMPLANT
WATER STERILE IRR 1000ML POUR (IV SOLUTION) ×3 IMPLANT
WATER STERILE IRR 500ML POUR (IV SOLUTION) ×3 IMPLANT

## 2022-06-17 NOTE — Anesthesia Procedure Notes (Signed)
Procedure Name: Intubation Date/Time: 06/17/2022 1:19 PM  Performed by: Carmelina Paddock, RNPre-anesthesia Checklist: Patient identified, Patient being monitored, Timeout performed, Emergency Drugs available and Suction available Patient Re-evaluated:Patient Re-evaluated prior to induction Oxygen Delivery Method: Circle system utilized Preoxygenation: Pre-oxygenation with 100% oxygen Induction Type: IV induction Ventilation: Mask ventilation without difficulty Laryngoscope Size: McGraph and 3 Grade View: Grade I Tube type: Oral Tube size: 7.0 mm Number of attempts: 1 Airway Equipment and Method: Stylet Placement Confirmation: ETT inserted through vocal cords under direct vision, positive ETCO2 and breath sounds checked- equal and bilateral Secured at: 21 cm Tube secured with: Tape Dental Injury: Teeth and Oropharynx as per pre-operative assessment

## 2022-06-17 NOTE — Progress Notes (Signed)
  Chaplain On-Call received a call at 1036 from Same Day Surgery Unit, RN [facility], who reported that the patient wanted to complete Advance Directives prior to the upcoming surgery at 12 noon.  Chaplain brought an AD documents packet to the Unit, and at 1052, Nurse Hopkins told the Chaplain that the patient and partner had changed their minds, and the patient did not want to proceed to complete the Living Will. The patient had entered the necessary information on the Living Will form; it had not been Witnessed or Notarized.  Chaplain Pollyann Samples M.Div., [facility]

## 2022-06-17 NOTE — Anesthesia Preprocedure Evaluation (Addendum)
Anesthesia Evaluation  Patient identified by MRN, date of birth, ID band Patient awake    Reviewed: Allergy & Precautions, NPO status , Patient's Chart, lab work & pertinent test results  Airway Mallampati: III  TM Distance: >3 FB Neck ROM: full    Dental no notable dental hx.    Pulmonary neg pulmonary ROS,    Pulmonary exam normal        Cardiovascular negative cardio ROS Normal cardiovascular exam     Neuro/Psych negative neurological ROS  negative psych ROS   GI/Hepatic Neg liver ROS, GERD  Controlled and Medicated,  Endo/Other  diabetes, Well Controlled, Type 2, Insulin Dependent, Oral Hypoglycemic AgentsHypothyroidism Morbid obesity  Renal/GU      Musculoskeletal   Abdominal (+) + obese,   Peds  Hematology negative hematology ROS (+)   Anesthesia Other Findings bilateral breast cancer  Past Medical History: No date: Arthritis     Comment:  lumbar spine No date: Breast cancer ([place]) No date: Cataract 10/14/2021: COVID-19 virus infection No date: Depression No date: Gallstones     Comment:  asyptomatic No date: GERD (gastroesophageal reflux disease) No date: Glaucoma No date: Headache     Comment:  h/o migraines No date: Hyperlipidemia No date: Hypothyroidism No date: Obesity No date: Thyroid disease No date: Type 2 diabetes mellitus ([place])     Comment:  manages with diet and exercise  Past Surgical History: 12/28/2008: BREAST BIOPSY 04/28/2022: BREAST BIOPSY; Right     Comment:  [place] core bx 11:00 coil clip -GRADE II INVASIVE MAMMARY               CARCINOMA WITH LOBULAR 05/22/2022: BREAST BIOPSY; Left     Comment:  MR guided bx-DUCTAL CARCINOMA IN SITU WITH SOLID,               CRIBRIFORM AND MICROPAPILLARY GROWTH PATTERN. 12/28/1996: CARDIAC CATHETERIZATION     Comment:  and a glaucoma procedure as well that has a metal shunt               in left eye 10/30/2004: COLONOSCOPY 04/01/2016:  COLONOSCOPY WITH PROPOFOL; N/A     Comment:  Procedure: COLONOSCOPY WITH PROPOFOL;  Surgeon: Robert Bellow, MD;  Location: [facility] ENDOSCOPY;  Service:               Endoscopy;  Laterality: N/A; No date: EYE SURGERY; Bilateral     Comment:  cataract sx No date: TONSILLECTOMY 1983: WISDOM TOOTH EXTRACTION  BMI    Body Mass Index: 40.06 kg/m      Reproductive/Obstetrics negative OB ROS                            Anesthesia Physical Anesthesia Plan  ASA: 2  Anesthesia Plan: General ETT   Post-op Pain Management: Tylenol PO (pre-op)* and Celebrex PO (pre-op)*   Induction: Intravenous  PONV Risk Score and Plan: 3 and Ondansetron, Dexamethasone and Midazolam  Airway Management Planned: Oral ETT  Additional Equipment:   Intra-op Plan:   Post-operative Plan: Extubation in OR  Informed Consent: I have reviewed the patients History and Physical, chart, labs and discussed the procedure including the risks, benefits and alternatives for the proposed anesthesia with the patient or authorized representative who has indicated his/her understanding and acceptance.     Dental Advisory Given  Plan Discussed with: Anesthesiologist, CRNA and Surgeon  Anesthesia Plan Comments:        Anesthesia Quick Evaluation

## 2022-06-17 NOTE — Discharge Instructions (Signed)
AMBULATORY SURGERY  ?DISCHARGE INSTRUCTIONS ? ? ?The drugs that you were given will stay in your system until tomorrow so for the next 24 hours you should not: ? ?Drive an automobile ?Make any legal decisions ?Drink any alcoholic beverage ? ? ?You may resume regular meals tomorrow.  Today it is better to start with liquids and gradually work up to solid foods. ? ?You may eat anything you prefer, but it is better to start with liquids, then soup and crackers, and gradually work up to solid foods. ? ? ?Please notify your doctor immediately if you have any unusual bleeding, trouble breathing, redness and pain at the surgery site, drainage, fever, or pain not relieved by medication. ? ? ? ?Additional Instructions: ? ? ? ?Please contact your physician with any problems or Same Day Surgery at [phone], Monday through Friday 6 am to 4 pm, or [facility] Main number at [phone].  ?

## 2022-06-17 NOTE — Op Note (Addendum)
Preoperative diagnosis: 1) invasive mammary carcinoma of the right breast; 2) DCIS left breast.  Postoperative diagnosis: Same.  Operative procedure: 1) right breast wide excision with wire and ultrasound localization, tissue transfer technique 10 cm 2, sentinel lymph node biopsy; 2) wide excision left breast DCIS with wire localization.  Operating surgeon: Donnalee Curry, MD.  Anesthesia: General by LMA, Marcaine 0.5% with 1: 200,000 units of epinephrine.  Estimated blood loss: 10 cc.  Clinical note: This woman recently was identified with an invasive mammary carcinoma with lobular features.  Subsequent breast MRI suggested a new abnormality in the contralateral breast which on biopsy was DCIS.  She desires breast conservation.  She underwent preoperative wire localization of both sites as well as an injection with technetium sulfur colloid on the right prior to the procedure. The patient had SCD stockings and received preoperative Ancef.  Operative note: The patient underwent general anesthesia and tolerated this well.  The breast chest and axilla was cleansed with ChloraPrep and draped.  Ultrasound was used to identify the wire in the right breast which was at the posterior aspect of the area of concern on MRI.  The photo of this is in the chart.  A circumareolar incision was chosen as the MRI suggested the process extended to the base of the nipple.  This was from the 9 to 3 o'clock position superiorly.  Local anesthesia was infiltrated.  The skin was incised sharply remaining dissection completed with electrocautery.  The adipose tissue was elevated off the underlying breast parenchyma.  The tissue beneath the nipple and the areola was dissected free to the base of the nipple.  A extra small Alexis wound protector was placed.  The dissection was then extended to include the area of the wire tip.  Specimen radiograph confirmed the previously placed wire and clip.  The tissue was sent to pathology  and subsequently reported back of the area of concern on the superior margin.  While the breast specimen was being processed attention was turned to the axilla.  Local anesthesia was infiltrated at the level of increased uptake with the node seeker.  A transverse skin incision was used.  The skin was incised sharply remaining dissection with electrocautery.  The axillary envelope was opened and a small wound 8 to 10 mm node with counts of 12,000 was identified.  Further dissection showed increased uptake in the area of Rotter's nodes.  A second hot node with counts of 5000 was identified at this level with some tedious dissection.  Hemostasis was with electrocautery.  With good hemostasis the axillary envelope was closed with  2-0 Vicryl figure-of-eight sutures.  The adipose layer was approximated in similar fashion.  The skin was closed with a running 4-0 Vicryl subcuticular suture.  Attention was turned to the superior border of the original wide excision site and an additional 5-7 mm plate of tissue covering this area was excised, orientated onto a Telfa pad (new superior margin on the pad) and sent to pathology for routine processing.  The breast parenchyma was then elevated off the underlying layer of deep adipose tissue above the pectoralis fascia circumferentially in a tissue transfer technique of 10 cm2 to allow to be approximated with interrupted 2-0 Vicryl figure-of-eight sutures in 2 layers to obliterate dead space.  The skin was closed with a running 4-0 Vicryl subcuticular suture.  Attention was turned to the left breast.  Local anesthesia was infiltrated and a curvilinear incision in the upper outer quadrant was made.  The localizing wire was brought into the field.  This wire was anterior to the previously placed "dumbbell" clip.  The wire was dissected down to the beginning of the thick portion and then the block of tissue approximately 3 x 3 x 3 cm was excised orientated and specimen  radiograph confirmed both the dumbbell clip as well as the intact wire.  The breast parenchyma was approximated with interrupted 2-0 Vicryl figure-of-eight sutures in 2 layers.  The skin was closed with a running 4-0 Vicryl subcuticular suture.  Benzoin and Steri-Strips were applied to all wounds.  Telfa, fluff gauze and a compressive wrap were applied.  The patient tolerated the procedure well and was taken to the PACU in stable condition.

## 2022-06-17 NOTE — H&P (Signed)
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  For breast conservation surgery.  Medications Prior to Admission  Medication Sig Dispense Refill Last Dose   b complex vitamins tablet Take 1 tablet by mouth daily.   Past Week   BIOTIN PO Take 5 mg by mouth daily.   Past Week   Cholecalciferol 50 MCG (2000 UT) CAPS Take 1 capsule by mouth daily after lunch.   Past Week   famotidine (PEPCID) 20 MG tablet Take 20 mg by mouth at bedtime.   06/17/2022   Fish Oil-Krill Oil (KRILL OIL PLUS PO) Take 1 tablet by mouth daily at 6 (six) AM.   Past Week   glipiZIDE (GLUCOTROL) 5 MG tablet Take 1 tablet (5 mg total) by mouth 2 (two) times daily before a meal. For diabetes. 180 tablet 3 Past Week   Insulin Glargine (BASAGLAR KWIKPEN) 100 UNIT/ML Inject 32 Units into the skin daily. For diabetes. (Patient taking differently: Inject 32 Units into the skin at bedtime. For diabetes.) 30 mL 1 06/16/2022   latanoprost (XALATAN) 0.005 % ophthalmic solution Place 1 drop into both eyes every morning.   Past Week   levothyroxine (SYNTHROID) 100 MCG tablet Take 1 tablet by mouth every morning on an empty stomach with water only.  No food or other medications for 30 minutes. 90 tablet 1 06/17/2022   Magnesium 200 MG TABS Take 1 tablet by mouth at bedtime.   Past Week   metFORMIN (GLUCOPHAGE-XR) 500 MG 24 hr tablet TAKE 2 TABLETS BY MOUTH TWICE DAILY WITH A MEAL FOR DIABETES 360 tablet 1 Past Week   rosuvastatin (CRESTOR) 10 MG tablet TAKE 1 TABLET BY MOUTH ONCE DAILY FOR CHOLESTEROL (Patient taking differently: Take 10 mg by mouth at bedtime. TAKE 1 TABLET BY MOUTH ONCE DAILY FOR CHOLESTEROL) 90 tablet 3 06/16/2022   VITAMIN A PO Take 2,400 mcg by mouth daily at 6 (six) AM.   Past Week   fluticasone (FLONASE) 50 MCG/ACT nasal spray Place into both nostrils daily as needed for allergies or rhinitis.      Insulin Pen  Needle (B-D ULTRAFINE III SHORT PEN) 31G X 8 MM MISC USE WITH INSULIN NIGHTLY 100 each 1    Allergies  Allergen Reactions   Ciprofloxacin Swelling    At the injection site   Trulicity [Dulaglutide]     Itch at inj site   Antihistamines, Chlorpheniramine-Type Palpitations   Past Medical History:  Diagnosis Date   Arthritis    lumbar spine   Breast cancer ([place])    Cataract    COVID-19 virus infection 10/14/2021   Depression    Gallstones    asyptomatic   GERD (gastroesophageal reflux disease)    Glaucoma    Headache    h/o migraines   Hyperlipidemia    Hypothyroidism    Obesity    Thyroid disease    Type 2 diabetes mellitus ([place])    manages with diet and exercise   Past Surgical History:  Procedure Laterality Date   BREAST BIOPSY  12/28/2008   BREAST BIOPSY Right 04/28/2022   [place] core bx 11:00 coil clip -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   CARDIAC CATHETERIZATION  12/28/1996   and a glaucoma procedure as well that has a metal  shunt in left eye   COLONOSCOPY  10/30/2004   COLONOSCOPY WITH PROPOFOL N/A 04/01/2016   Procedure: COLONOSCOPY WITH PROPOFOL;  Surgeon: Robert Bellow, MD;  Location: [facility] ENDOSCOPY;  Service: Endoscopy;  Laterality: N/A;   EYE SURGERY Bilateral    cataract sx   TONSILLECTOMY     WISDOM TOOTH EXTRACTION  1983   Social History   Socioeconomic History   Marital status: Widowed    Spouse name: Not on file   Number of children: Not on file   Years of education: Not on file   Highest education level: Not on file  Occupational History   Not on file  Tobacco Use   Smoking status: Never   Smokeless tobacco: Never  Vaping Use   Vaping Use: Never used  Substance and Sexual Activity   Alcohol use: Yes    Comment: occasional wine   Drug use: No   Sexual activity: Yes  Other Topics Concern   Not  on file  Social History Narrative   Widower.   Step children.   Environmental health practitioner.   Enjoys riding hot air balloons, spending time with friends, reading, jewelry   Social Determinants of Health   Financial Resource Strain: Low Risk  (12/01/2021)   Overall Financial Resource Strain (CARDIA)    Difficulty of Paying Living Expenses: Not hard at all  Food Insecurity: No Food Insecurity (12/01/2021)   Hunger Vital Sign    Worried About Running Out of Food in the Last Year: Never true    Ran Out of Food in the Last Year: Never true  Transportation Needs: No Transportation Needs (12/01/2021)   PRAPARE - Hydrologist (Medical): No    Lack of Transportation (Non-Medical): No  Physical Activity: Sufficiently Active (12/01/2021)   Exercise Vital Sign    Days of Exercise per Week: 4 days    Minutes of Exercise per Session: 40 min  Stress: No Stress Concern Present (12/01/2021)   [place]    Feeling of Stress : Not at all  Social Connections: Moderately Isolated (12/01/2021)   Social Connection and Isolation Panel [NHANES]    Frequency of Communication with Friends and Family: More than three times a week    Frequency of Social Gatherings with Friends and Family: Three times a week    Attends Religious Services: Never    Active Member of Clubs or Organizations: Yes    Attends Archivist Meetings: More than 4 times per year    Marital Status: Widowed  Intimate Partner Violence: Not At Risk (12/01/2021)   Humiliation, Afraid, Rape, and Kick questionnaire    Fear of Current or Ex-Partner: No    Emotionally Abused: No    Physically Abused: No    Sexually Abused: No   Social History   Social History Narrative   Widower.   Step children.   Environmental health practitioner.   Enjoys riding hot air balloons, spending time with friends, reading, jewelry     ROS: Negative.      PE: HEENT: Negative. Lungs: Clear. Cardio: RR.   Assessment/Plan:  Proceed with planned breast cancer removal and right SLN biopsy.  Forest Gleason [facility] 06/17/2022

## 2022-06-17 NOTE — Transfer of Care (Cosign Needed)
Immediate Anesthesia Transfer of Care Note  Patient: Valerie Gordon  Procedure(s) Performed: BREAST LUMPECTOMY WITH NEEDLE LOCALIZATION (Bilateral) AXILLARY SENTINEL NODE BIOPSY (Right)  Patient Location: PACU  Anesthesia Type:General  Level of Consciousness: drowsy and patient cooperative  Airway & Oxygen Therapy: Patient Spontanous Breathing and Patient connected to face mask  Post-op Assessment: Report given to RN and Post -op Vital signs reviewed and stable  Post vital signs: Reviewed and stable  Last Vitals:  Vitals Value Taken Time  BP 115/59 06/17/22 1610  Temp    Pulse 92 06/17/22 1612  Resp 19 06/17/22 1612  SpO2 95 % 06/17/22 1612  Vitals shown include unvalidated device data.  Last Pain:  Vitals:   06/17/22 0934  TempSrc: Oral  PainSc: 3          Complications: No notable events documented.

## 2022-06-18 ENCOUNTER — Encounter: Payer: Self-pay | Admitting: General Surgery

## 2022-06-19 ENCOUNTER — Encounter: Payer: Self-pay | Admitting: *Deleted

## 2022-06-23 ENCOUNTER — Other Ambulatory Visit: Payer: Self-pay | Admitting: Anatomic Pathology & Clinical Pathology

## 2022-06-23 LAB — SURGICAL PATHOLOGY

## 2022-06-29 ENCOUNTER — Ambulatory Visit: Payer: Medicare HMO | Admitting: Psychologist

## 2022-07-06 ENCOUNTER — Encounter: Payer: Self-pay | Admitting: *Deleted

## 2022-07-06 ENCOUNTER — Telehealth: Payer: Self-pay | Admitting: *Deleted

## 2022-07-06 ENCOUNTER — Inpatient Hospital Stay: Payer: Medicare HMO | Attending: Internal Medicine | Admitting: Internal Medicine

## 2022-07-06 ENCOUNTER — Encounter: Payer: Self-pay | Admitting: Radiation Oncology

## 2022-07-06 ENCOUNTER — Encounter: Payer: Self-pay | Admitting: Internal Medicine

## 2022-07-06 ENCOUNTER — Inpatient Hospital Stay: Payer: Medicare HMO

## 2022-07-06 ENCOUNTER — Ambulatory Visit
Admission: RE | Admit: 2022-07-06 | Discharge: 2022-07-06 | Disposition: A | Discharge status: Home / Self Care | Payer: Medicare HMO | Source: Ambulatory Visit | Attending: Radiation Oncology | Admitting: Radiation Oncology

## 2022-07-06 VITALS — Ht 61.0 in | Wt 214.6 lb

## 2022-07-06 DIAGNOSIS — E039 Hypothyroidism, unspecified: Secondary | ICD-10-CM | POA: Diagnosis not present

## 2022-07-06 DIAGNOSIS — D0512 Intraductal carcinoma in situ of left breast: Secondary | ICD-10-CM | POA: Diagnosis not present

## 2022-07-06 DIAGNOSIS — Z170 Estrogen receptor positive status [ER+]: Secondary | ICD-10-CM | POA: Insufficient documentation

## 2022-07-06 DIAGNOSIS — E1136 Type 2 diabetes mellitus with diabetic cataract: Secondary | ICD-10-CM | POA: Insufficient documentation

## 2022-07-06 DIAGNOSIS — M8580 Other specified disorders of bone density and structure, unspecified site: Secondary | ICD-10-CM | POA: Insufficient documentation

## 2022-07-06 DIAGNOSIS — Z8616 Personal history of COVID-19: Secondary | ICD-10-CM | POA: Diagnosis not present

## 2022-07-06 DIAGNOSIS — Z7984 Long term (current) use of oral hypoglycemic drugs: Secondary | ICD-10-CM | POA: Insufficient documentation

## 2022-07-06 DIAGNOSIS — E785 Hyperlipidemia, unspecified: Secondary | ICD-10-CM | POA: Diagnosis not present

## 2022-07-06 DIAGNOSIS — K219 Gastro-esophageal reflux disease without esophagitis: Secondary | ICD-10-CM | POA: Diagnosis not present

## 2022-07-06 DIAGNOSIS — E669 Obesity, unspecified: Secondary | ICD-10-CM | POA: Diagnosis not present

## 2022-07-06 DIAGNOSIS — R232 Flushing: Secondary | ICD-10-CM | POA: Insufficient documentation

## 2022-07-06 DIAGNOSIS — Z794 Long term (current) use of insulin: Secondary | ICD-10-CM | POA: Diagnosis not present

## 2022-07-06 DIAGNOSIS — Z79890 Hormone replacement therapy (postmenopausal): Secondary | ICD-10-CM | POA: Insufficient documentation

## 2022-07-06 DIAGNOSIS — H401133 Primary open-angle glaucoma, bilateral, severe stage: Secondary | ICD-10-CM | POA: Diagnosis not present

## 2022-07-06 DIAGNOSIS — C50411 Malignant neoplasm of upper-outer quadrant of right female breast: Secondary | ICD-10-CM | POA: Insufficient documentation

## 2022-07-06 NOTE — Progress Notes (Signed)
[facility] [place] NOTE  Patient Care Team: Pleas Koch, NP as PCP - General (Internal Medicine) Ammie Dalton, Okey Regal, MD as Consulting Physician (Obstetrics and Gynecology) Bary Castilla, Forest Gleason, MD (General Surgery) Kennith Center, RD as Dietitian (Family Medicine) Theodore Demark, RN (Inactive) as Oncology Nurse Navigator Daiva Huge, RN as Oncology Nurse Navigator Cammie Sickle, MD as Consulting Physician (Oncology)  CHIEF COMPLAINTS/PURPOSE OF CONSULTATION: Breast cancer  #  Oncology History Overview Note  IMPRESSION: 1. 1.3 cm irregular mass with associated distortion in the right breast at 11 o'clock, 2 cm the nipple, highly suspicious for malignancy. No abnormal right axillary lymph nodes.   RECOMMENDATION: 1. Ultrasound-guided core needle biopsy of the right breast highly suspicious mass. A.  BREAST MASS, RIGHT 11:00 2 CM FN; ULTRASOUND-GUIDED BIOPSY:  - INVASIVE MAMMARY CARCINOMA WITH LOBULAR FEATURES.   Size of invasive carcinoma: 8 mm in this sample  Histologic grade of invasive carcinoma: Grade 2                       Glandular/tubular differentiation score: 3                       Nuclear pleomorphism score: 2                       Mitotic rate score: 1                       Total score: 6  Ductal carcinoma in situ: Present, intermediate grade  Lymphovascular invasion: Not identified   ER/PR/HER2: Immunohistochemistry will be performed on block A2, with  reflex to [place] for HER2 2+. The results will be reported in an addendum.   Comment:  The definitive grade will be assigned on the excisional specimen.   GROSS DESCRIPTION:  A. Labeled: Right breast 11:00 2 cm from the nipple  Received: Formalin  Time/date in fixative: Collected and placed in formalin at 8:30 AM on  04/28/2022  Cold ischemic time: Less than 1 minute  Total fixation time: Approximately 9 hours  Core pieces: 4  Size: Ranges from 0.5-2.0 cm in length and 0.2 cm in  diameter  Description: Tan cores of fibrofatty tissue  Ink color: Black  Entirely submitted in 2 cassettes with 2 cores in A1 and 2 cores in A2.   CM 04/28/2022   Final Diagnosis performed by Quay Burow, MD.   Electronically signed  04/29/2022 1:04:41PM  The electronic signature indicates that the named Attending Pathologist  has evaluated the specimen  Technical component performed at [facility], [address] [place],  [place] [address] Lab: [phone] Dir: Rush Farmer, MD, MMM   Professional component performed at [facility], [facility], [place], [employer], [place] [address] Lab: [phone]  Dir: Kathi Simpers, MD   ADDENDUM:  CASE SUMMARY: BREAST BIOMARKER TESTS  Estrogen Receptor (ER) Status: POSITIVE          Percentage of cells with nuclear positivity: Greater than 90%          Average intensity of staining: Strong   Progesterone Receptor (PgR) Status: POSITIVE          Percentage of cells with nuclear positivity: Greater than 90%          Average intensity of staining: Strong   HER2 (by immunohistochemistry): NEGATIVE (Score 0)  Ki-67: Not performed   #April  2023 clinical stage I -ER/PR positive HER2 =0 invasive mammary carcinoma with lobular features.  [Dr.Byrnett] May 2023 breast MRI-right side lesion-measuring 3.4 x 1.7 x 2.2 centimeters.     #May 2023 breast MRI showed-left breast indeterminate oval 1.5 centimeter mass in the UPPER-OUTER QUADRANT LEFT.  S/p biopsy positive for ER/PR positive DCIS.   Carcinoma of upper-outer quadrant of right breast in female, estrogen receptor positive ([place])  05/06/2022 Initial Diagnosis   Carcinoma of upper-outer quadrant of right breast in female, estrogen receptor positive ([place])   05/06/2022 Cancer Staging   Staging form: Breast, AJCC 8th Edition - Clinical: Stage IA (cT1c, cN0, cM0, G2, ER+, PR+, HER2-) - Signed by Cammie Sickle, MD on 05/06/2022 Histologic grading system: 3 grade system     HISTORY OF PRESENTING ILLNESS: Ambulating independently.  Accompanied by significant other.  Valerie Gordon 71 y.o.  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  Postoperatively no complications noted.  Patient healing well from the surgery.  Review of Systems  Constitutional:  Negative for chills, diaphoresis, fever, malaise/fatigue and weight loss.  HENT:  Negative for nosebleeds and sore throat.   Eyes:  Negative for double vision.  Respiratory:  Negative for cough, hemoptysis, sputum production, shortness of breath and wheezing.   Cardiovascular:  Negative for chest pain, palpitations, orthopnea and leg swelling.  Gastrointestinal:  Negative for abdominal pain, blood in stool, constipation, diarrhea, heartburn, melena, nausea and vomiting.  Genitourinary:  Negative for dysuria, frequency and urgency.  Musculoskeletal:  Negative for back pain and joint pain.  Skin: Negative.  Negative for itching and rash.  Neurological:  Negative for dizziness, tingling, focal weakness, weakness and headaches.  Endo/Heme/Allergies:  Does not bruise/bleed easily.  Psychiatric/Behavioral:  Negative for depression. The patient is not nervous/anxious and does not have insomnia.      MEDICAL HISTORY:  Past Medical History:  Diagnosis Date   Arthritis    lumbar spine   Breast cancer ([place])    Cataract    COVID-19 virus infection 10/14/2021   Depression    Gallstones    asyptomatic   GERD (gastroesophageal reflux disease)    Glaucoma    Headache    h/o migraines   Hyperlipidemia    Hypothyroidism    Obesity    Thyroid disease    Type 2 diabetes mellitus ([place])    manages with diet and exercise    SURGICAL HISTORY: Past Surgical History:  Procedure Laterality Date   AXILLARY SENTINEL NODE BIOPSY Right 06/17/2022   Procedure: AXILLARY SENTINEL NODE BIOPSY;  Surgeon: Robert Bellow, MD;  Location:  [facility] ORS;  Service: General;  Laterality: Right;   BREAST BIOPSY  12/28/2008   BREAST BIOPSY Right 04/28/2022   [place] core bx 11:00 coil clip -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   BREAST LUMPECTOMY WITH NEEDLE LOCALIZATION Bilateral 06/17/2022   Procedure: BREAST LUMPECTOMY WITH NEEDLE LOCALIZATION;  Surgeon: Robert Bellow, MD;  Location: [facility] ORS;  Service: General;  Laterality: Bilateral;   CARDIAC CATHETERIZATION  12/28/1996   and a glaucoma procedure as well that has a metal shunt in left eye   COLONOSCOPY  10/30/2004   COLONOSCOPY WITH PROPOFOL N/A 04/01/2016   Procedure: COLONOSCOPY WITH PROPOFOL;  Surgeon: Robert Bellow, MD;  Location: [facility] ENDOSCOPY;  Service:  Endoscopy;  Laterality: N/A;   EYE SURGERY Bilateral    cataract sx   TONSILLECTOMY     WISDOM TOOTH EXTRACTION  1983    SOCIAL HISTORY: Social History   Socioeconomic History   Marital status: Widowed    Spouse name: Not on file   Number of children: Not on file   Years of education: Not on file   Highest education level: Not on file  Occupational History   Not on file  Tobacco Use   Smoking status: Never   Smokeless tobacco: Never  Vaping Use   Vaping Use: Never used  Substance and Sexual Activity   Alcohol use: Yes    Comment: occasional wine   Drug use: No   Sexual activity: Yes  Other Topics Concern   Not on file  Social History Narrative   Widower.   Step children.   Environmental health practitioner.   Enjoys riding hot air balloons, spending time with friends, reading, jewelry   Social Determinants of Health   Financial Resource Strain: Low Risk  (12/01/2021)   Overall Financial Resource Strain (CARDIA)    Difficulty of Paying Living Expenses: Not hard at all  Food Insecurity: No Food Insecurity (12/01/2021)   Hunger Vital Sign    Worried About  Running Out of Food in the Last Year: Never true    Ran Out of Food in the Last Year: Never true  Transportation Needs: No Transportation Needs (12/01/2021)   PRAPARE - Hydrologist (Medical): No    Lack of Transportation (Non-Medical): No  Physical Activity: Sufficiently Active (12/01/2021)   Exercise Vital Sign    Days of Exercise per Week: 4 days    Minutes of Exercise per Session: 40 min  Stress: No Stress Concern Present (12/01/2021)   [place]    Feeling of Stress : Not at all  Social Connections: Moderately Isolated (12/01/2021)   Social Connection and Isolation Panel [NHANES]    Frequency of Communication with Friends and Family: More than three times a week    Frequency of Social Gatherings with Friends and Family: Three times a week    Attends Religious Services: Never    Active Member of Clubs or Organizations: Yes    Attends Archivist Meetings: More than 4 times per year    Marital Status: Widowed  Intimate Partner Violence: Not At Risk (12/01/2021)   Humiliation, Afraid, Rape, and Kick questionnaire    Fear of Current or Ex-Partner: No    Emotionally Abused: No    Physically Abused: No    Sexually Abused: No    FAMILY HISTORY: Family History  Problem Relation Age of Onset   Stroke Mother    Heart disease Father    Lung cancer Maternal Grandmother    Stomach cancer Paternal Grandmother    Breast cancer Neg Hx     ALLERGIES:  is allergic to ciprofloxacin; trulicity [dulaglutide]; and antihistamines, chlorpheniramine-type.  MEDICATIONS:  Current Outpatient Medications  Medication Sig Dispense Refill   b complex vitamins tablet Take 1 tablet by mouth daily.     BIOTIN PO Take 5 mg by mouth daily.     Cholecalciferol 50 MCG (2000 UT) CAPS Take 1 capsule by mouth daily after lunch.     famotidine (PEPCID) 20 MG tablet Take 20 mg by mouth at bedtime.     Fish  Oil-Krill Oil (KRILL OIL PLUS PO) Take 1 tablet by  mouth daily at 6 (six) AM.     fluticasone (FLONASE) 50 MCG/ACT nasal spray Place into both nostrils daily as needed for allergies or rhinitis.     glipiZIDE (GLUCOTROL) 5 MG tablet Take 1 tablet (5 mg total) by mouth 2 (two) times daily before a meal. For diabetes. 180 tablet 3   HYDROcodone-acetaminophen (NORCO/VICODIN) 5-325 MG tablet Take 1 tablet by mouth every 4 (four) hours as needed for moderate pain. 20 tablet 0   Insulin Glargine (BASAGLAR KWIKPEN) 100 UNIT/ML Inject 32 Units into the skin daily. For diabetes. (Patient taking differently: Inject 32 Units into the skin at bedtime. For diabetes.) 30 mL 1   Insulin Pen Needle (B-D ULTRAFINE III SHORT PEN) 31G X 8 MM MISC USE WITH INSULIN NIGHTLY 100 each 1   latanoprost (XALATAN) 0.005 % ophthalmic solution Place 1 drop into both eyes every morning.     levothyroxine (SYNTHROID) 100 MCG tablet Take 1 tablet by mouth every morning on an empty stomach with water only.  No food or other medications for 30 minutes. 90 tablet 1   Magnesium 200 MG TABS Take 1 tablet by mouth at bedtime.     metFORMIN (GLUCOPHAGE-XR) 500 MG 24 hr tablet TAKE 2 TABLETS BY MOUTH TWICE DAILY WITH A MEAL FOR DIABETES 360 tablet 1   rosuvastatin (CRESTOR) 10 MG tablet TAKE 1 TABLET BY MOUTH ONCE DAILY FOR CHOLESTEROL (Patient taking differently: Take 10 mg by mouth at bedtime. TAKE 1 TABLET BY MOUTH ONCE DAILY FOR CHOLESTEROL) 90 tablet 3   VITAMIN A PO Take 2,400 mcg by mouth daily at 6 (six) AM.     No current facility-administered medications for this visit.      Marland Kitchen  PHYSICAL EXAMINATION: ECOG PERFORMANCE STATUS: 0 - Asymptomatic  Vitals:   07/06/22 1017  BP: (!) 150/65   Filed Weights   07/06/22 1017  Weight: 214 lb (97.1 kg)    Physical Exam Vitals and nursing note reviewed.  HENT:     Head: Normocephalic and atraumatic.     Mouth/Throat:     Pharynx: Oropharynx is clear.  Eyes:     Extraocular  Movements: Extraocular movements intact.     Pupils: Pupils are equal, round, and reactive to light.  Cardiovascular:     Rate and Rhythm: Normal rate and regular rhythm.  Pulmonary:     Comments: Decreased breath sounds bilaterally.  Abdominal:     Palpations: Abdomen is soft.  Musculoskeletal:        General: Normal range of motion.     Cervical back: Normal range of motion.  Skin:    General: Skin is warm.  Neurological:     General: No focal deficit present.     Mental Status: She is alert and oriented to person, place, and time.  Psychiatric:        Behavior: Behavior normal.        Judgment: Judgment normal.      LABORATORY DATA:  I have reviewed the data as listed Lab Results  Component Value Date   WBC 10.1 06/12/2022   HGB 14.2 06/12/2022   HCT 42.2 06/12/2022   MCV 91.5 06/12/2022   PLT 307 06/12/2022   Recent Labs    01/12/22 0942 06/12/22 1441  NA 131* 137  K 4.9 4.1  CL 97 103  CO2 26 24  GLUCOSE 172* 127*  BUN 10 14  CREATININE 0.84 1.14*  CALCIUM 9.9 10.0  GFRNONAA  --  52*  PROT  7.3  --   ALBUMIN 4.5  --   AST 61*  --   ALT 26  --   ALKPHOS 70  --   BILITOT 0.5  --     RADIOGRAPHIC STUDIES: I have personally reviewed the radiological images as listed and agreed with the findings in the report. MM BREAST SURGICAL SPECIMEN  Result Date: 06/17/2022 CLINICAL DATA:  Status post left lumpectomy for DCIS marked with a barbell shaped biopsy marker clip. EXAM: SPECIMEN RADIOGRAPH OF THE LEFT BREAST COMPARISON:  Previous examinations. FINDINGS: Status post excision of the left breast. The wire tip and barbell shaped biopsy marker clip are present and are intact. IMPRESSION: Specimen radiograph of the left breast. Electronically Signed   By: Claudie Revering M.D.   On: 06/17/2022 16:55  MM Breast Surgical Specimen  Result Date: 06/17/2022 CLINICAL DATA:  Right lumpectomy for invasive mammary carcinoma with lobular features marked with a coil shaped  biopsy marker clip. EXAM: SPECIMEN RADIOGRAPH OF THE RIGHT BREAST COMPARISON:  Previous examinations. FINDINGS: Status post excision of the right breast. The wire tip and coil shaped biopsy marker clip are present and intact. IMPRESSION: Specimen radiograph of the right breast. Electronically Signed   By: Claudie Revering M.D.   On: 06/17/2022 14:37  NM Sentinel Node Inj-No Rpt (Breast)  Result Date: 06/17/2022 Sulfur Colloid was injected by the Nuclear Medicine Technologist for sentinel lymph node localization.   MM RT PLC BREAST LOC DEV   1ST LESION  INC MAMMO GUIDE  Result Date: 06/17/2022 CLINICAL DATA:  Pre lumpectomy localization of recently diagnosed left breast DCIS marked with a barbell shaped clip and right breast invasive mammary carcinoma with lobular features marked with a coil shaped clip. EXAM: NEEDLE LOCALIZATION OF THE BILATERAL BREAST WITH MAMMO GUIDANCE COMPARISON:  None Available. PROCEDURE: Patient presents for needle localization prior to bilateral lumpectomies. I met with the patient and we discussed the procedure of needle localization including benefits and alternatives. We discussed the high likelihood of successful procedures. We discussed the risks of the procedures, including infection, bleeding, tissue injury, and further surgery. Informed, written consent was given. The usual time-out protocol was performed immediately prior to the procedures. LEFT BREAST Using mammographic guidance, sterile technique, 1% lidocaine and a 5 cm modified Kopans needle, the recently placed barbell shaped clip in the outer left breast was localized using lateral approach. The images were marked for Dr. Bary Castilla. RIGHT BREAST Using mammographic guidance, sterile technique, 1% lidocaine and a 7 cm modified Kopans needle, the recently placed coil clip in the slightly lateral anterior right breast was localized using a medial approach. The images were marked for Dr. Bary Castilla. IMPRESSION: Needle localization  both breasts.  No apparent complications. Electronically Signed   By: Claudie Revering M.D.   On: 06/17/2022 09:05  MM LT PLC BREAST LOC DEV   1ST LESION  INC MAMMO GUIDE  Result Date: 06/17/2022 CLINICAL DATA:  Pre lumpectomy localization of recently diagnosed left breast DCIS marked with a barbell shaped clip and right breast invasive mammary carcinoma with lobular features marked with a coil shaped clip. EXAM: NEEDLE LOCALIZATION OF THE BILATERAL BREAST WITH MAMMO GUIDANCE COMPARISON:  None Available. PROCEDURE: Patient presents for needle localization prior to bilateral lumpectomies. I met with the patient and we discussed the procedure of needle localization including benefits and alternatives. We discussed the high likelihood of successful procedures. We discussed the risks of the procedures, including infection, bleeding, tissue injury, and further surgery. Informed, written consent was  given. The usual time-out protocol was performed immediately prior to the procedures. LEFT BREAST Using mammographic guidance, sterile technique, 1% lidocaine and a 5 cm modified Kopans needle, the recently placed barbell shaped clip in the outer left breast was localized using lateral approach. The images were marked for Dr. Bary Castilla. RIGHT BREAST Using mammographic guidance, sterile technique, 1% lidocaine and a 7 cm modified Kopans needle, the recently placed coil clip in the slightly lateral anterior right breast was localized using a medial approach. The images were marked for Dr. Bary Castilla. IMPRESSION: Needle localization both breasts.  No apparent complications. Electronically Signed   By: Claudie Revering M.D.   On: 06/17/2022 09:05   ASSESSMENT & PLAN:   Carcinoma of upper-outer quadrant of right breast in female, estrogen receptor positive ([place]) #Right breast cancer stage I PT1CN0-INVASIVE MAMMARY CARCINOMA WITH DUCTAL AND LOBULAR FEATURES, 14 MM. PN=0; G-2. ?  Focal involvement of the margin by DCIS; invasive  carcinoma negative margins.   #I discussed the role of Oncotype for her stage of cancer as per the NCCN guidelines.  Discussed that it is very unlikely that patient will need chemotherapy.  Patient interested in Oncotype.  Will order.  Discussed that patient will need adjuvant radiation; again with less likely chemotherapy.  Patient will need adjuvant endocrine therapy after finishing adjuvant radiation.  #Left breast DCIS status postlumpectomy-proceed with adjuvant radiation.    #I again reviewed and discussed the role of endocrine therapy-given ER/PR positive disease postsurgery' post adjuvant RT.  Patient will be offered antihormone pill 1 a day for 5 years.  Discussed potential downsides including but not limited to arthralgias hot flashes and osteoporosis.  Patient had severe hot flashes after her natural menopause.  Discussed that this will also help prevent developing any cancers in the contralateral breast/DCIS recurrence.  #Osteopenia: BMD March 2023- T-score of -1.9.  Monitor for now.  * Oncotype- ordered; will call  # DISPOSITION: # follow up in 2 months  MD; no labs-Dr.B    All questions were answered. The patient/family knows to call the clinic with any problems, questions or concerns.       Cammie Sickle, MD 07/06/2022 10:54 AM

## 2022-07-06 NOTE — Assessment & Plan Note (Signed)
#  Right breast cancer stage I PT1CN0-INVASIVE MAMMARY CARCINOMA WITH DUCTAL AND LOBULAR FEATURES, 14 MM. PN=0; G-2. ?  Focal involvement of the margin by DCIS; invasive carcinoma negative margins.   #I discussed the role of Oncotype for her stage of cancer as per the NCCN guidelines.  Discussed that it is very unlikely that patient will need chemotherapy.  Patient interested in Oncotype.  Will order.  Discussed that patient will need adjuvant radiation; again with less likely chemotherapy.  Patient will need adjuvant endocrine therapy after finishing adjuvant radiation.  #Left breast DCIS status postlumpectomy-proceed with adjuvant radiation.    #I again reviewed and discussed the role of endocrine therapy-given ER/PR positive disease postsurgery' post adjuvant RT.  Patient will be offered antihormone pill 1 a day for 5 years.  Discussed potential downsides including but not limited to arthralgias hot flashes and osteoporosis.  Patient had severe hot flashes after her natural menopause.  Discussed that this will also help prevent developing any cancers in the contralateral breast/DCIS recurrence.  #Osteopenia: BMD March 2023- T-score of -1.9.  Monitor for now.  * Oncotype- ordered; will call  # DISPOSITION: # follow up in 2 months  MD; no labs-Dr.B

## 2022-07-06 NOTE — Telephone Encounter (Signed)
Oncotype Dx order submitted online.   Order ID:   [id number]

## 2022-07-06 NOTE — Consult Note (Signed)
NEW PATIENT EVALUATION  Name: Valerie Gordon  MRN: [id number]  Date:   07/06/2022     DOB: 09-24-1951   This 71 y.o. 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  REFERRING PHYSICIAN: Pleas Koch, NP  CHIEF COMPLAINT:  Chief Complaint  Patient presents with   Breast Cancer    consult    DIAGNOSIS: The encounter diagnosis was Carcinoma of upper-outer quadrant of right breast in female, estrogen receptor positive ([place]).   PREVIOUS INVESTIGATIONS:  Mammogram ultrasound reviewed Pathology reports reviewed Clinical notes reviewed  HPI: Patient is a 71 year old female who presented with an abnormal mammogram of her bilateral breasts.  She had asymmetry in the right breast prompting further work-up showing a Tomei 1.3 cm mass associated with the distortion in the right breast at the 11 o'clock position 2 cm from the nipple no abnormal right axillary nodes were noted on ultrasound.  She also went underwent MRI of the breast showing again a 1.5 cm mass in the upper outer quadrant of the left breast as well as the abnormality detected earlier in the right breast.  Ultrasound-guided biopsy of the right breast showed invasive mammary carcinoma with lobular features.  Patient underwent wide local excision of the right breast with sentinel node biopsy showing invasive mammary carcinoma with both mixed ductal and lobular features measuring 1.4 cm.  Margins were clear at 3 mm.  There was a focal area of DCIS with clear margin although close at 1 mm.  Tumor was again ER/PR positive HER2/neu not overexpressed.  2 sentinel lymph nodes were examined both negative for malignancy.  Left breast had ductal carcinoma in situ approximately 2 cm with margins clear but close at 1  mm.  No regional lymph nodes were submitted.  She has done well postoperatively.  She is seen medical oncology who is ordering an Oncotype DX for the right breast invasive component.  She is still somewhat tender in her right axilla which we expect.  Otherwise specifically denies breast tenderness cough or bone pain.  PLANNED TREATMENT REGIMEN: Bilateral breast radiation  PAST MEDICAL HISTORY:  has a past medical history of Arthritis, Breast cancer ([place]), Cataract, COVID-19 virus infection (10/14/2021), Depression, Gallstones, GERD (gastroesophageal reflux disease), Glaucoma, Headache, Hyperlipidemia, Hypothyroidism, Obesity, Thyroid disease, and Type 2 diabetes mellitus ([place]).    PAST SURGICAL HISTORY:  Past Surgical History:  Procedure Laterality Date   AXILLARY SENTINEL NODE BIOPSY Right 06/17/2022   Procedure: AXILLARY SENTINEL NODE BIOPSY;  Surgeon: Robert Bellow, MD;  Location: [facility] ORS;  Service: General;  Laterality: Right;   BREAST BIOPSY  12/28/2008   BREAST BIOPSY Right 04/28/2022   [place] core bx 11:00 coil clip -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   BREAST LUMPECTOMY WITH NEEDLE LOCALIZATION Bilateral 06/17/2022   Procedure: BREAST LUMPECTOMY WITH NEEDLE LOCALIZATION;  Surgeon: Robert Bellow, MD;  Location: [facility] ORS;  Service: General;  Laterality: Bilateral;   CARDIAC CATHETERIZATION  12/28/1996   and a glaucoma procedure as well that has a metal shunt in left eye   COLONOSCOPY  10/30/2004   COLONOSCOPY WITH PROPOFOL N/A 04/01/2016   Procedure: COLONOSCOPY WITH PROPOFOL;  Surgeon: Robert Bellow, MD;  Location: [facility] ENDOSCOPY;  Service: Endoscopy;  Laterality: N/A;   EYE SURGERY Bilateral    cataract sx   TONSILLECTOMY     WISDOM TOOTH EXTRACTION  1983    FAMILY HISTORY: family history includes Heart disease in her father;  Lung cancer in her maternal grandmother; Stomach cancer in her paternal grandmother; Stroke in her mother.  SOCIAL HISTORY:  reports that she has never smoked. She has never used smokeless tobacco. She reports current alcohol use. She reports that she does not use drugs.  ALLERGIES: Ciprofloxacin; Trulicity [dulaglutide]; and Antihistamines, chlorpheniramine-type  MEDICATIONS:  Current Outpatient Medications  Medication Sig Dispense Refill   b complex vitamins tablet Take 1 tablet by mouth daily.     BIOTIN PO Take 5 mg by mouth daily.     Cholecalciferol 50 MCG (2000 UT) CAPS Take 1 capsule by mouth daily after lunch.     famotidine (PEPCID) 20 MG tablet Take 20 mg by mouth at bedtime.     Fish Oil-Krill Oil (KRILL OIL PLUS PO) Take 1 tablet by mouth daily at 6 (six) AM.     fluticasone (FLONASE) 50 MCG/ACT nasal spray Place into both nostrils daily as needed for allergies or rhinitis.     glipiZIDE (GLUCOTROL) 5 MG tablet Take 1 tablet (5 mg total) by mouth 2 (two) times daily before a meal. For diabetes. 180 tablet 3   HYDROcodone-acetaminophen (NORCO/VICODIN) 5-325 MG tablet Take 1 tablet by mouth every 4 (four) hours as needed for moderate pain. 20 tablet 0   Insulin Glargine (BASAGLAR KWIKPEN) 100 UNIT/ML Inject 32 Units into the skin daily. For diabetes. (Patient taking differently: Inject 32 Units into the skin at bedtime. For diabetes.) 30 mL 1   Insulin Pen Needle (B-D ULTRAFINE III SHORT PEN) 31G X 8 MM MISC USE WITH INSULIN NIGHTLY 100 each 1   latanoprost (XALATAN) 0.005 % ophthalmic solution Place 1 drop into both eyes every morning.     levothyroxine (SYNTHROID) 100 MCG tablet Take 1 tablet by mouth every morning on an empty stomach with water only.  No food or other medications for 30 minutes. 90 tablet 1   Magnesium 200 MG TABS Take 1 tablet by mouth at bedtime.     metFORMIN (GLUCOPHAGE-XR) 500 MG 24 hr tablet TAKE 2 TABLETS BY MOUTH TWICE DAILY WITH A MEAL FOR DIABETES 360  tablet 1   rosuvastatin (CRESTOR) 10 MG tablet TAKE 1 TABLET BY MOUTH ONCE DAILY FOR CHOLESTEROL (Patient taking differently: Take 10 mg by mouth at bedtime. TAKE 1 TABLET BY MOUTH ONCE DAILY FOR CHOLESTEROL) 90 tablet 3   VITAMIN A PO Take 2,400 mcg by mouth daily at 6 (six) AM.     No current facility-administered medications for this encounter.    ECOG PERFORMANCE STATUS:  0 - Asymptomatic  REVIEW OF SYSTEMS: Patient denies any weight loss, fatigue, weakness, fever, chills or night sweats. Patient denies any loss of vision, blurred vision. Patient denies any ringing  of the ears or hearing loss. No irregular heartbeat. Patient denies heart murmur or history of fainting. Patient denies any chest pain or pain radiating to her upper extremities. Patient denies any shortness of breath, difficulty breathing at night, cough or hemoptysis. Patient denies any swelling in the lower legs. Patient denies any nausea vomiting, vomiting of blood, or coffee ground material in the vomitus. Patient denies any stomach pain. Patient states has had normal bowel movements no significant constipation or diarrhea. Patient  denies any dysuria, hematuria or significant nocturia. Patient denies any problems walking, swelling in the joints or loss of balance. Patient denies any skin changes, loss of hair or loss of weight. Patient denies any excessive worrying or anxiety or significant depression. Patient denies any problems with insomnia. Patient denies excessive thirst, polyuria, polydipsia. Patient denies any swollen glands, patient denies easy bruising or easy bleeding. Patient denies any recent infections, allergies or URI. Patient "s visual fields have not changed significantly in recent time.   PHYSICAL EXAM: Ht 5' 1"  (1.549 m)   Wt 214 lb 9.6 oz (97.3 kg)   BMI 40.55 kg/m  She status post wide local excision and sentinel node biopsy sites of the right breast both healing well left breast wide local excision site is  also healing well no dominant masses noted in either breast no axillary or supraclavicular adenopathy is noted.  Well-developed well-nourished patient in NAD. HEENT reveals PERLA, EOMI, discs not visualized.  Oral cavity is clear. No oral mucosal lesions are identified. Neck is clear without evidence of cervical or supraclavicular adenopathy. Lungs are clear to A&P. Cardiac examination is essentially unremarkable with regular rate and rhythm without murmur rub or thrill. Abdomen is benign with no organomegaly or masses noted. Motor sensory and DTR levels are equal and symmetric in the upper and lower extremities. Cranial nerves II through XII are grossly intact. Proprioception is intact. No peripheral adenopathy or edema is identified. No motor or sensory levels are noted. Crude visual fields are within normal range.  LABORATORY DATA: Pathology reports reviewed    RADIOLOGY RESULTS: Mammograms ultrasound and MRI scans reviewed compatible with above-stated findings   IMPRESSION: Stage Ia invasive mammary carcinoma of the right breast status post wide local excision and sentinel node biopsy ER/PR positive and left breast showing ER positive ductal carcinoma in situ status post wide local excision in 71 year old female  PLAN: At this time I would recommend bilateral breast radiation.  We will treat her both breast with hypofractionated course of whole breast radiation over 3 weeks.  Would also boost both scars to 1600 cGy in electron-beam based on the DCIS margins of approximately 1 mm.  Risks and benefits of treatment occluding skin reaction fatigue alteration blood counts possible inclusion of superficial lung all were reviewed in detail with the patient.  She seems to comprehend my treatment plan well.  We will wait for results of Oncotype DX prior to proceeding with radiation.  She also will be candidate for endocrine therapy after completion of radiation.  I would like to take this opportunity to thank  you for allowing me to participate in the care of your patient.Noreene Filbert, MD

## 2022-07-06 NOTE — Progress Notes (Signed)
Met with patient at her Rad onc consulation.   She is doing well, some swelling in her breasts.  Also arm is tight when lifting it.   Referral for Gwenette Greet was entered.

## 2022-07-08 ENCOUNTER — Encounter: Payer: Self-pay | Admitting: *Deleted

## 2022-07-13 ENCOUNTER — Ambulatory Visit (INDEPENDENT_AMBULATORY_CARE_PROVIDER_SITE_OTHER): Payer: Medicare HMO | Admitting: Psychologist

## 2022-07-13 ENCOUNTER — Ambulatory Visit: Payer: Medicare HMO

## 2022-07-13 DIAGNOSIS — F330 Major depressive disorder, recurrent, mild: Secondary | ICD-10-CM | POA: Diagnosis not present

## 2022-07-13 DIAGNOSIS — R69 Illness, unspecified: Secondary | ICD-10-CM | POA: Diagnosis not present

## 2022-07-13 NOTE — Progress Notes (Signed)
[place] Counselor/Therapist Progress Note  Patient ID: Valerie Gordon, MRN: [id number],    Date: 07/13/2022  Time Spent: 01:07 pm to 01:45 pm; total time: 38 minutes   This session was held via video webex teletherapy due to the coronavirus risk at this time. The patient consented to video teletherapy and was located at her home during this session. She is aware it is the responsibility of the patient to secure confidentiality on her end of the session. The provider was in a private home office for the duration of this session. Limits of confidentiality were discussed with the patient.   Treatment Type: Individual Therapy  Reported Symptoms: Concern related to brain fog  Mental Status Exam: Appearance:  Casual     Behavior: Appropriate  Motor: Normal  Speech/Language:  Clear and Coherent  Affect: Appropriate  Mood: normal  Thought process: normal  Thought content:   WNL  Sensory/Perceptual disturbances:   WNL  Orientation: oriented to person, place, and time/date  Attention: Good  Concentration: Good  Memory: WNL  Fund of knowledge:  Good  Insight:   Fair  Judgment:  Fair  Impulse Control: Good   Risk Assessment: Danger to Self:  No Self-injurious Behavior: No Danger to Others: No Duty to Warn:no Physical Aggression / Violence:No  Access to Firearms a concern: No  Gang Involvement:No   Subjective: Beginning the session, patient described herself as doing well while reflecting on her cancer journey thus far. She processed thoughts and emotions she has been experiencing related to cancer. From there, she processed and reflected on her concerns related to experiencing "brain fog". She was agreeable to the plan discussed. She asked to follow up. She denied suicidal and homicidal ideation.    Interventions:  Worked on developing a therapeutic relationship with the patient using active listening and reflective statements. Provided emotional support using  empathy and validation. Used summary statements. Normalized and validated expressed thoughts and emotions. Praised the patient for doing much better and explored what has assisted the patient. Reflected on patient's experience with cancer. Validated thoughts and emotions related to cancer. Explored concern related to "brain fog". Used socratic questions to assist the patient. Praised patient for steps she was taking to implement self-care. Assisted in problem solving regarding lightening her work load. Explored the idea of writing a letter to the different emotions she is experiencing related to cancer. Processed thoughts and emotions. Provided empathic statements. Assessed for suicidal and homicidal ideation.   Homework: Look into the potential option of having a volunteer and write letter to emotions  Next Session: Review homework and emotional support. Process emotions. Discuss what the emotions are trying to tell the patient  Diagnosis: F33.0 major depressive affective disorder, recurrent, mild   Plan:    Goals Alleviate depressive symptoms Recognize, accept, and cope with depressive feelings Develop healthy thinking patterns Develop healthy interpersonal relationships  Objectives target date for all objectives is 01/21/2023 Cooperate with a medication evaluation by a physician Verbalize an accurate understanding of depression Verbalize an understanding of the treatment Identify and replace thoughts that support depression Learn and implement behavioral strategies Verbalize an understanding and resolution of current interpersonal problems Learn and implement problem solving and decision making skills Learn and implement conflict resolution skills to resolve interpersonal problems Verbalize an understanding of healthy and unhealthy emotions verbalize insight into how past relationships may be influence current experiences with depression Use mindfulness and acceptance strategies and  increase value based behavior  Increase hopeful statements about  the future.  Interventions Consistent with treatment model, discuss how change in cognitive, behavioral, and interpersonal can help client alleviate depression CBT Behavioral activation help the client explore the relationship, nature of the dispute,  Help the client develop new interpersonal skills and relationships Conduct Problem so living therapy Teach conflict resolution skills Use a process-experiential approach Conduct TLDP Conduct ACT Evaluate need for psychotropic medication Monitor adherence to medication   The patient and clinician reviewed the treatment plan on 02/16/2022. The patient approved of the treatment plan.   Conception Chancy, PsyD

## 2022-07-14 DIAGNOSIS — C50412 Malignant neoplasm of upper-outer quadrant of left female breast: Secondary | ICD-10-CM | POA: Diagnosis not present

## 2022-07-14 DIAGNOSIS — C50411 Malignant neoplasm of upper-outer quadrant of right female breast: Secondary | ICD-10-CM | POA: Diagnosis not present

## 2022-07-14 DIAGNOSIS — Z170 Estrogen receptor positive status [ER+]: Secondary | ICD-10-CM | POA: Diagnosis not present

## 2022-07-15 ENCOUNTER — Encounter: Payer: Self-pay | Admitting: Internal Medicine

## 2022-07-16 NOTE — Progress Notes (Signed)
I spoke to patient regarding the results of the Oncotype low risk of recurrence.  Given the lack of significant benefit I would not recommend chemotherapy.  Proceed with radiation as planned will follow-up with me as planned

## 2022-07-17 ENCOUNTER — Telehealth: Payer: Self-pay | Admitting: Internal Medicine

## 2022-07-17 NOTE — Telephone Encounter (Signed)
On 7/20-I spoke to patient regarding the results of the Oncotype.  Low risk of recurrence-would not recommend chemotherapy.  Patient to proceed with adjuvant radiation as planned; follow-up with me as planned.   FYI-Drs.Byrnett/Chrystal.

## 2022-07-20 ENCOUNTER — Ambulatory Visit: Admission: RE | Admit: 2022-07-20 | Payer: Medicare HMO | Source: Ambulatory Visit

## 2022-07-27 ENCOUNTER — Ambulatory Visit: Payer: Medicare HMO

## 2022-07-28 ENCOUNTER — Ambulatory Visit: Payer: Medicare HMO

## 2022-07-29 ENCOUNTER — Ambulatory Visit
Admission: RE | Admit: 2022-07-29 | Discharge: 2022-07-29 | Disposition: A | Discharge status: Home / Self Care | Payer: Medicare HMO | Source: Ambulatory Visit | Attending: Radiation Oncology | Admitting: Radiation Oncology

## 2022-07-29 ENCOUNTER — Encounter: Payer: Self-pay | Admitting: *Deleted

## 2022-07-29 ENCOUNTER — Ambulatory Visit: Payer: Medicare HMO

## 2022-07-29 DIAGNOSIS — C50411 Malignant neoplasm of upper-outer quadrant of right female breast: Secondary | ICD-10-CM | POA: Diagnosis not present

## 2022-07-29 DIAGNOSIS — Z510 Encounter for antineoplastic radiation therapy: Secondary | ICD-10-CM | POA: Diagnosis not present

## 2022-07-29 DIAGNOSIS — Z170 Estrogen receptor positive status [ER+]: Secondary | ICD-10-CM | POA: Diagnosis not present

## 2022-07-29 DIAGNOSIS — C50412 Malignant neoplasm of upper-outer quadrant of left female breast: Secondary | ICD-10-CM | POA: Diagnosis not present

## 2022-07-30 ENCOUNTER — Ambulatory Visit: Payer: Medicare HMO

## 2022-07-31 ENCOUNTER — Ambulatory Visit: Payer: Medicare HMO

## 2022-07-31 ENCOUNTER — Other Ambulatory Visit: Payer: Self-pay | Admitting: *Deleted

## 2022-07-31 DIAGNOSIS — Z170 Estrogen receptor positive status [ER+]: Secondary | ICD-10-CM

## 2022-08-03 ENCOUNTER — Ambulatory Visit: Payer: Medicare HMO

## 2022-08-04 ENCOUNTER — Ambulatory Visit: Payer: Medicare HMO

## 2022-08-04 ENCOUNTER — Telehealth: Payer: Self-pay | Admitting: Occupational Therapy

## 2022-08-04 DIAGNOSIS — Z170 Estrogen receptor positive status [ER+]: Secondary | ICD-10-CM | POA: Diagnosis not present

## 2022-08-04 DIAGNOSIS — C50411 Malignant neoplasm of upper-outer quadrant of right female breast: Secondary | ICD-10-CM | POA: Diagnosis not present

## 2022-08-04 DIAGNOSIS — C50412 Malignant neoplasm of upper-outer quadrant of left female breast: Secondary | ICD-10-CM | POA: Diagnosis not present

## 2022-08-04 DIAGNOSIS — Z510 Encounter for antineoplastic radiation therapy: Secondary | ICD-10-CM | POA: Diagnosis not present

## 2022-08-04 NOTE — Telephone Encounter (Signed)
pt called in to have appt cancelled, Said she met with her Dr. for post opt appt and he suggested that it is to early for any Physical Therapy. Pt will follow up after next Post opt appt in 6 weeks.

## 2022-08-05 ENCOUNTER — Ambulatory Visit: Admission: RE | Admit: 2022-08-05 | Payer: Medicare HMO | Source: Ambulatory Visit

## 2022-08-05 ENCOUNTER — Ambulatory Visit: Payer: Medicare HMO

## 2022-08-05 ENCOUNTER — Inpatient Hospital Stay: Payer: Medicare HMO | Admitting: Occupational Therapy

## 2022-08-05 DIAGNOSIS — C50411 Malignant neoplasm of upper-outer quadrant of right female breast: Secondary | ICD-10-CM | POA: Diagnosis not present

## 2022-08-05 DIAGNOSIS — C50412 Malignant neoplasm of upper-outer quadrant of left female breast: Secondary | ICD-10-CM | POA: Diagnosis not present

## 2022-08-05 DIAGNOSIS — Z170 Estrogen receptor positive status [ER+]: Secondary | ICD-10-CM | POA: Diagnosis not present

## 2022-08-05 DIAGNOSIS — Z510 Encounter for antineoplastic radiation therapy: Secondary | ICD-10-CM | POA: Diagnosis not present

## 2022-08-06 ENCOUNTER — Other Ambulatory Visit: Payer: Self-pay

## 2022-08-06 ENCOUNTER — Ambulatory Visit
Admission: RE | Admit: 2022-08-06 | Discharge: 2022-08-06 | Disposition: A | Discharge status: Home / Self Care | Payer: Medicare HMO | Source: Ambulatory Visit | Attending: Radiation Oncology | Admitting: Radiation Oncology

## 2022-08-06 ENCOUNTER — Ambulatory Visit: Payer: Medicare HMO

## 2022-08-06 DIAGNOSIS — C50412 Malignant neoplasm of upper-outer quadrant of left female breast: Secondary | ICD-10-CM | POA: Diagnosis not present

## 2022-08-06 DIAGNOSIS — Z170 Estrogen receptor positive status [ER+]: Secondary | ICD-10-CM | POA: Diagnosis not present

## 2022-08-06 DIAGNOSIS — Z510 Encounter for antineoplastic radiation therapy: Secondary | ICD-10-CM | POA: Diagnosis not present

## 2022-08-06 DIAGNOSIS — C50411 Malignant neoplasm of upper-outer quadrant of right female breast: Secondary | ICD-10-CM | POA: Diagnosis not present

## 2022-08-06 LAB — RAD ONC ARIA SESSION SUMMARY
Course Elapsed Days: 0
Plan Fractions Treated to Date: 1
Plan Fractions Treated to Date: 1
Plan Prescribed Dose Per Fraction: 2.66 Gy
Plan Prescribed Dose Per Fraction: 2.66 Gy
Plan Total Fractions Prescribed: 16
Plan Total Fractions Prescribed: 16
Plan Total Prescribed Dose: 42.56 Gy
Plan Total Prescribed Dose: 42.56 Gy
Reference Point Dosage Given to Date: 2.66 Gy
Reference Point Dosage Given to Date: 2.66 Gy
Reference Point Session Dosage Given: 2.66 Gy
Reference Point Session Dosage Given: 2.66 Gy
Session Number: 1

## 2022-08-07 ENCOUNTER — Encounter: Payer: Self-pay | Admitting: *Deleted

## 2022-08-07 ENCOUNTER — Ambulatory Visit
Admission: RE | Admit: 2022-08-07 | Discharge: 2022-08-07 | Disposition: A | Discharge status: Home / Self Care | Payer: Medicare HMO | Source: Ambulatory Visit | Attending: Radiation Oncology | Admitting: Radiation Oncology

## 2022-08-07 ENCOUNTER — Ambulatory Visit: Payer: Medicare HMO

## 2022-08-07 ENCOUNTER — Other Ambulatory Visit: Payer: Self-pay

## 2022-08-07 DIAGNOSIS — Z170 Estrogen receptor positive status [ER+]: Secondary | ICD-10-CM | POA: Diagnosis not present

## 2022-08-07 DIAGNOSIS — Z510 Encounter for antineoplastic radiation therapy: Secondary | ICD-10-CM | POA: Diagnosis not present

## 2022-08-07 DIAGNOSIS — C50411 Malignant neoplasm of upper-outer quadrant of right female breast: Secondary | ICD-10-CM | POA: Diagnosis not present

## 2022-08-07 DIAGNOSIS — C50412 Malignant neoplasm of upper-outer quadrant of left female breast: Secondary | ICD-10-CM | POA: Diagnosis not present

## 2022-08-07 LAB — RAD ONC ARIA SESSION SUMMARY
Course Elapsed Days: 1
Plan Fractions Treated to Date: 2
Plan Fractions Treated to Date: 2
Plan Prescribed Dose Per Fraction: 2.66 Gy
Plan Prescribed Dose Per Fraction: 2.66 Gy
Plan Total Fractions Prescribed: 16
Plan Total Fractions Prescribed: 16
Plan Total Prescribed Dose: 42.56 Gy
Plan Total Prescribed Dose: 42.56 Gy
Reference Point Dosage Given to Date: 5.32 Gy
Reference Point Dosage Given to Date: 5.32 Gy
Reference Point Session Dosage Given: 2.66 Gy
Reference Point Session Dosage Given: 2.66 Gy
Session Number: 2

## 2022-08-10 ENCOUNTER — Other Ambulatory Visit: Payer: Self-pay

## 2022-08-10 ENCOUNTER — Ambulatory Visit: Payer: Medicare HMO

## 2022-08-10 ENCOUNTER — Ambulatory Visit
Admission: RE | Admit: 2022-08-10 | Discharge: 2022-08-10 | Disposition: A | Discharge status: Home / Self Care | Payer: Medicare HMO | Source: Ambulatory Visit | Attending: Radiation Oncology | Admitting: Radiation Oncology

## 2022-08-10 DIAGNOSIS — Z170 Estrogen receptor positive status [ER+]: Secondary | ICD-10-CM | POA: Diagnosis not present

## 2022-08-10 DIAGNOSIS — C50412 Malignant neoplasm of upper-outer quadrant of left female breast: Secondary | ICD-10-CM | POA: Diagnosis not present

## 2022-08-10 DIAGNOSIS — Z510 Encounter for antineoplastic radiation therapy: Secondary | ICD-10-CM | POA: Diagnosis not present

## 2022-08-10 DIAGNOSIS — C50411 Malignant neoplasm of upper-outer quadrant of right female breast: Secondary | ICD-10-CM | POA: Diagnosis not present

## 2022-08-10 LAB — RAD ONC ARIA SESSION SUMMARY
Course Elapsed Days: 4
Plan Fractions Treated to Date: 3
Plan Fractions Treated to Date: 3
Plan Prescribed Dose Per Fraction: 2.66 Gy
Plan Prescribed Dose Per Fraction: 2.66 Gy
Plan Total Fractions Prescribed: 16
Plan Total Fractions Prescribed: 16
Plan Total Prescribed Dose: 42.56 Gy
Plan Total Prescribed Dose: 42.56 Gy
Reference Point Dosage Given to Date: 7.98 Gy
Reference Point Dosage Given to Date: 7.98 Gy
Reference Point Session Dosage Given: 2.66 Gy
Reference Point Session Dosage Given: 2.66 Gy
Session Number: 3

## 2022-08-11 ENCOUNTER — Ambulatory Visit: Payer: Medicare HMO

## 2022-08-11 ENCOUNTER — Ambulatory Visit
Admission: RE | Admit: 2022-08-11 | Discharge: 2022-08-11 | Disposition: A | Discharge status: Home / Self Care | Payer: Medicare HMO | Source: Ambulatory Visit | Attending: Radiation Oncology | Admitting: Radiation Oncology

## 2022-08-11 ENCOUNTER — Other Ambulatory Visit: Payer: Self-pay

## 2022-08-11 DIAGNOSIS — C50412 Malignant neoplasm of upper-outer quadrant of left female breast: Secondary | ICD-10-CM | POA: Diagnosis not present

## 2022-08-11 DIAGNOSIS — Z510 Encounter for antineoplastic radiation therapy: Secondary | ICD-10-CM | POA: Diagnosis not present

## 2022-08-11 DIAGNOSIS — Z170 Estrogen receptor positive status [ER+]: Secondary | ICD-10-CM | POA: Diagnosis not present

## 2022-08-11 DIAGNOSIS — C50411 Malignant neoplasm of upper-outer quadrant of right female breast: Secondary | ICD-10-CM | POA: Diagnosis not present

## 2022-08-11 LAB — RAD ONC ARIA SESSION SUMMARY
Course Elapsed Days: 5
Plan Fractions Treated to Date: 4
Plan Fractions Treated to Date: 4
Plan Prescribed Dose Per Fraction: 2.66 Gy
Plan Prescribed Dose Per Fraction: 2.66 Gy
Plan Total Fractions Prescribed: 16
Plan Total Fractions Prescribed: 16
Plan Total Prescribed Dose: 42.56 Gy
Plan Total Prescribed Dose: 42.56 Gy
Reference Point Dosage Given to Date: 10.64 Gy
Reference Point Dosage Given to Date: 10.64 Gy
Reference Point Session Dosage Given: 2.66 Gy
Reference Point Session Dosage Given: 2.66 Gy
Session Number: 4

## 2022-08-12 ENCOUNTER — Ambulatory Visit (INDEPENDENT_AMBULATORY_CARE_PROVIDER_SITE_OTHER): Payer: Medicare HMO | Admitting: Psychologist

## 2022-08-12 ENCOUNTER — Other Ambulatory Visit: Payer: Self-pay

## 2022-08-12 ENCOUNTER — Ambulatory Visit
Admission: RE | Admit: 2022-08-12 | Discharge: 2022-08-12 | Disposition: A | Discharge status: Home / Self Care | Payer: Medicare HMO | Source: Ambulatory Visit | Attending: Radiation Oncology | Admitting: Radiation Oncology

## 2022-08-12 ENCOUNTER — Ambulatory Visit: Payer: Medicare HMO

## 2022-08-12 DIAGNOSIS — Z170 Estrogen receptor positive status [ER+]: Secondary | ICD-10-CM | POA: Diagnosis not present

## 2022-08-12 DIAGNOSIS — R69 Illness, unspecified: Secondary | ICD-10-CM | POA: Diagnosis not present

## 2022-08-12 DIAGNOSIS — C50411 Malignant neoplasm of upper-outer quadrant of right female breast: Secondary | ICD-10-CM | POA: Diagnosis not present

## 2022-08-12 DIAGNOSIS — F330 Major depressive disorder, recurrent, mild: Secondary | ICD-10-CM | POA: Diagnosis not present

## 2022-08-12 DIAGNOSIS — C50412 Malignant neoplasm of upper-outer quadrant of left female breast: Secondary | ICD-10-CM | POA: Diagnosis not present

## 2022-08-12 DIAGNOSIS — Z510 Encounter for antineoplastic radiation therapy: Secondary | ICD-10-CM | POA: Diagnosis not present

## 2022-08-12 LAB — RAD ONC ARIA SESSION SUMMARY
Course Elapsed Days: 6
Plan Fractions Treated to Date: 5
Plan Fractions Treated to Date: 5
Plan Prescribed Dose Per Fraction: 2.66 Gy
Plan Prescribed Dose Per Fraction: 2.66 Gy
Plan Total Fractions Prescribed: 16
Plan Total Fractions Prescribed: 16
Plan Total Prescribed Dose: 42.56 Gy
Plan Total Prescribed Dose: 42.56 Gy
Reference Point Dosage Given to Date: 13.3 Gy
Reference Point Dosage Given to Date: 13.3 Gy
Reference Point Session Dosage Given: 2.66 Gy
Reference Point Session Dosage Given: 2.66 Gy
Session Number: 5

## 2022-08-12 NOTE — Progress Notes (Signed)
[place] Counselor/Therapist Progress Note  Patient ID: Valerie Gordon, MRN: [id number],    Date: 08/12/2022  Time Spent: 03:05 pm to 03:46 pm; total time: 41 minutes   This session was held via video webex teletherapy due to the coronavirus risk at this time. The patient consented to video teletherapy and was located at her home during this session. She is aware it is the responsibility of the patient to secure confidentiality on her end of the session. The provider was in a private home office for the duration of this session. Limits of confidentiality were discussed with the patient.   Treatment Type: Individual Therapy  Reported Symptoms: Sadness  Mental Status Exam: Appearance:  Casual     Behavior: Appropriate  Motor: Normal  Speech/Language:  Clear and Coherent  Affect: Appropriate  Mood: normal  Thought process: normal  Thought content:   WNL  Sensory/Perceptual disturbances:   WNL  Orientation: oriented to person, place, and time/date  Attention: Good  Concentration: Good  Memory: WNL  Fund of knowledge:  Good  Insight:   Fair  Judgment:  Fair  Impulse Control: Good   Risk Assessment: Danger to Self:  No Self-injurious Behavior: No Danger to Others: No Duty to Warn:no Physical Aggression / Violence:No  Access to Firearms a concern: No  Gang Involvement:No   Subjective: Beginning the session, patient described herself as fair while reflecting on going through radiation treatment. She reflected on events since the last session and talked about upcoming events. She then spent time reflecting on the letter she wrote to cancer. The theme of sadness was identified in the letter. Patient spent the session reflecting on and processing the different layers of sadness she experiences. She was agreeable to the homework and following up. She denied suicidal and homicidal ideation.    Interventions:  Worked on developing a therapeutic relationship with the  patient using active listening and reflective statements. Provided emotional support using empathy and validation. Reflected on events since the last session. Normalized and validated expressed thoughts. Processed patient's experience with radiation. Validated patient's experience. Reviewed the letter that the patient had written regarding cancer. Identified the theme of sadness. Used socratic questions to assist the patient gain insight into self. Used metaphors of sadness as an onion with different layers and as a blanket. Processed thoughts and emotions. Explored if sadness was trying to tell the patient something. Explored the etiology of sadness. Validated patient's experience. Provided psychoeducation about a podcast. Assigned homework. Provided empathic statements. Assessed for suicidal and homicidal ideation.   Homework: Listen to podcast episode on emotions  Next Session: Review homework and emotional support. Process emotions. Discuss what the emotions are trying to tell the patient. Particularly process sadness  Diagnosis: F33.0 major depressive affective disorder, recurrent, mild   Plan:    Goals Alleviate depressive symptoms Recognize, accept, and cope with depressive feelings Develop healthy thinking patterns Develop healthy interpersonal relationships  Objectives target date for all objectives is 01/21/2023 Cooperate with a medication evaluation by a physician Verbalize an accurate understanding of depression Verbalize an understanding of the treatment Identify and replace thoughts that support depression Learn and implement behavioral strategies Verbalize an understanding and resolution of current interpersonal problems Learn and implement problem solving and decision making skills Learn and implement conflict resolution skills to resolve interpersonal problems Verbalize an understanding of healthy and unhealthy emotions verbalize insight into how past relationships may be  influence current experiences with depression Use mindfulness and acceptance strategies and increase  value based behavior  Increase hopeful statements about the future.  Interventions Consistent with treatment model, discuss how change in cognitive, behavioral, and interpersonal can help client alleviate depression CBT Behavioral activation help the client explore the relationship, nature of the dispute,  Help the client develop new interpersonal skills and relationships Conduct Problem so living therapy Teach conflict resolution skills Use a process-experiential approach Conduct TLDP Conduct ACT Evaluate need for psychotropic medication Monitor adherence to medication   The patient and clinician reviewed the treatment plan on 02/16/2022. The patient approved of the treatment plan.   Conception Chancy, PsyD

## 2022-08-13 ENCOUNTER — Other Ambulatory Visit: Payer: Self-pay

## 2022-08-13 ENCOUNTER — Ambulatory Visit: Payer: Medicare HMO

## 2022-08-13 ENCOUNTER — Ambulatory Visit
Admission: RE | Admit: 2022-08-13 | Discharge: 2022-08-13 | Disposition: A | Discharge status: Home / Self Care | Payer: Medicare HMO | Source: Ambulatory Visit | Attending: Radiation Oncology | Admitting: Radiation Oncology

## 2022-08-13 DIAGNOSIS — Z170 Estrogen receptor positive status [ER+]: Secondary | ICD-10-CM | POA: Diagnosis not present

## 2022-08-13 DIAGNOSIS — C50412 Malignant neoplasm of upper-outer quadrant of left female breast: Secondary | ICD-10-CM | POA: Diagnosis not present

## 2022-08-13 DIAGNOSIS — C50411 Malignant neoplasm of upper-outer quadrant of right female breast: Secondary | ICD-10-CM | POA: Diagnosis not present

## 2022-08-13 DIAGNOSIS — Z510 Encounter for antineoplastic radiation therapy: Secondary | ICD-10-CM | POA: Diagnosis not present

## 2022-08-13 LAB — RAD ONC ARIA SESSION SUMMARY
Course Elapsed Days: 7
Plan Fractions Treated to Date: 6
Plan Fractions Treated to Date: 6
Plan Prescribed Dose Per Fraction: 2.66 Gy
Plan Prescribed Dose Per Fraction: 2.66 Gy
Plan Total Fractions Prescribed: 16
Plan Total Fractions Prescribed: 16
Plan Total Prescribed Dose: 42.56 Gy
Plan Total Prescribed Dose: 42.56 Gy
Reference Point Dosage Given to Date: 15.96 Gy
Reference Point Dosage Given to Date: 15.96 Gy
Reference Point Session Dosage Given: 2.66 Gy
Reference Point Session Dosage Given: 2.66 Gy
Session Number: 6

## 2022-08-14 ENCOUNTER — Ambulatory Visit: Payer: Medicare HMO

## 2022-08-14 ENCOUNTER — Other Ambulatory Visit: Payer: Self-pay

## 2022-08-14 ENCOUNTER — Ambulatory Visit
Admission: RE | Admit: 2022-08-14 | Discharge: 2022-08-14 | Disposition: A | Discharge status: Home / Self Care | Payer: Medicare HMO | Source: Ambulatory Visit | Attending: Radiation Oncology | Admitting: Radiation Oncology

## 2022-08-14 DIAGNOSIS — C50411 Malignant neoplasm of upper-outer quadrant of right female breast: Secondary | ICD-10-CM | POA: Diagnosis not present

## 2022-08-14 DIAGNOSIS — C50412 Malignant neoplasm of upper-outer quadrant of left female breast: Secondary | ICD-10-CM | POA: Diagnosis not present

## 2022-08-14 DIAGNOSIS — Z510 Encounter for antineoplastic radiation therapy: Secondary | ICD-10-CM | POA: Diagnosis not present

## 2022-08-14 DIAGNOSIS — Z170 Estrogen receptor positive status [ER+]: Secondary | ICD-10-CM | POA: Diagnosis not present

## 2022-08-14 LAB — RAD ONC ARIA SESSION SUMMARY
Course Elapsed Days: 8
Plan Fractions Treated to Date: 7
Plan Fractions Treated to Date: 7
Plan Prescribed Dose Per Fraction: 2.66 Gy
Plan Prescribed Dose Per Fraction: 2.66 Gy
Plan Total Fractions Prescribed: 16
Plan Total Fractions Prescribed: 16
Plan Total Prescribed Dose: 42.56 Gy
Plan Total Prescribed Dose: 42.56 Gy
Reference Point Dosage Given to Date: 18.62 Gy
Reference Point Dosage Given to Date: 18.62 Gy
Reference Point Session Dosage Given: 2.66 Gy
Reference Point Session Dosage Given: 2.66 Gy
Session Number: 7

## 2022-08-17 ENCOUNTER — Other Ambulatory Visit: Payer: Self-pay

## 2022-08-17 ENCOUNTER — Ambulatory Visit
Admission: RE | Admit: 2022-08-17 | Discharge: 2022-08-17 | Disposition: A | Discharge status: Home / Self Care | Payer: Medicare HMO | Source: Ambulatory Visit | Attending: Radiation Oncology | Admitting: Radiation Oncology

## 2022-08-17 DIAGNOSIS — Z510 Encounter for antineoplastic radiation therapy: Secondary | ICD-10-CM | POA: Diagnosis not present

## 2022-08-17 DIAGNOSIS — C50412 Malignant neoplasm of upper-outer quadrant of left female breast: Secondary | ICD-10-CM | POA: Diagnosis not present

## 2022-08-17 DIAGNOSIS — Z170 Estrogen receptor positive status [ER+]: Secondary | ICD-10-CM | POA: Diagnosis not present

## 2022-08-17 DIAGNOSIS — C50411 Malignant neoplasm of upper-outer quadrant of right female breast: Secondary | ICD-10-CM | POA: Diagnosis not present

## 2022-08-17 LAB — RAD ONC ARIA SESSION SUMMARY
Course Elapsed Days: 11
Plan Fractions Treated to Date: 8
Plan Fractions Treated to Date: 8
Plan Prescribed Dose Per Fraction: 2.66 Gy
Plan Prescribed Dose Per Fraction: 2.66 Gy
Plan Total Fractions Prescribed: 16
Plan Total Fractions Prescribed: 16
Plan Total Prescribed Dose: 42.56 Gy
Plan Total Prescribed Dose: 42.56 Gy
Reference Point Dosage Given to Date: 21.28 Gy
Reference Point Dosage Given to Date: 21.28 Gy
Reference Point Session Dosage Given: 2.66 Gy
Reference Point Session Dosage Given: 2.66 Gy
Session Number: 8

## 2022-08-18 ENCOUNTER — Other Ambulatory Visit: Payer: Self-pay

## 2022-08-18 ENCOUNTER — Ambulatory Visit
Admission: RE | Admit: 2022-08-18 | Discharge: 2022-08-18 | Disposition: A | Discharge status: Home / Self Care | Payer: Medicare HMO | Source: Ambulatory Visit | Attending: Radiation Oncology | Admitting: Radiation Oncology

## 2022-08-18 DIAGNOSIS — Z170 Estrogen receptor positive status [ER+]: Secondary | ICD-10-CM | POA: Diagnosis not present

## 2022-08-18 DIAGNOSIS — C50412 Malignant neoplasm of upper-outer quadrant of left female breast: Secondary | ICD-10-CM | POA: Diagnosis not present

## 2022-08-18 DIAGNOSIS — C50411 Malignant neoplasm of upper-outer quadrant of right female breast: Secondary | ICD-10-CM | POA: Diagnosis not present

## 2022-08-18 DIAGNOSIS — Z510 Encounter for antineoplastic radiation therapy: Secondary | ICD-10-CM | POA: Diagnosis not present

## 2022-08-18 LAB — RAD ONC ARIA SESSION SUMMARY
Course Elapsed Days: 12
Plan Fractions Treated to Date: 9
Plan Fractions Treated to Date: 9
Plan Prescribed Dose Per Fraction: 2.66 Gy
Plan Prescribed Dose Per Fraction: 2.66 Gy
Plan Total Fractions Prescribed: 16
Plan Total Fractions Prescribed: 16
Plan Total Prescribed Dose: 42.56 Gy
Plan Total Prescribed Dose: 42.56 Gy
Reference Point Dosage Given to Date: 23.94 Gy
Reference Point Dosage Given to Date: 23.94 Gy
Reference Point Session Dosage Given: 2.66 Gy
Reference Point Session Dosage Given: 2.66 Gy
Session Number: 9

## 2022-08-19 ENCOUNTER — Inpatient Hospital Stay: Payer: Medicare HMO | Attending: Internal Medicine

## 2022-08-19 ENCOUNTER — Ambulatory Visit
Admission: RE | Admit: 2022-08-19 | Discharge: 2022-08-19 | Disposition: A | Discharge status: Home / Self Care | Payer: Medicare HMO | Source: Ambulatory Visit | Attending: Radiation Oncology | Admitting: Radiation Oncology

## 2022-08-19 ENCOUNTER — Other Ambulatory Visit: Payer: Self-pay

## 2022-08-19 DIAGNOSIS — C50412 Malignant neoplasm of upper-outer quadrant of left female breast: Secondary | ICD-10-CM | POA: Diagnosis not present

## 2022-08-19 DIAGNOSIS — C50411 Malignant neoplasm of upper-outer quadrant of right female breast: Secondary | ICD-10-CM | POA: Diagnosis not present

## 2022-08-19 DIAGNOSIS — Z170 Estrogen receptor positive status [ER+]: Secondary | ICD-10-CM | POA: Insufficient documentation

## 2022-08-19 DIAGNOSIS — Z510 Encounter for antineoplastic radiation therapy: Secondary | ICD-10-CM | POA: Diagnosis not present

## 2022-08-19 LAB — RAD ONC ARIA SESSION SUMMARY
Course Elapsed Days: 13
Plan Fractions Treated to Date: 10
Plan Fractions Treated to Date: 10
Plan Prescribed Dose Per Fraction: 2.66 Gy
Plan Prescribed Dose Per Fraction: 2.66 Gy
Plan Total Fractions Prescribed: 16
Plan Total Fractions Prescribed: 16
Plan Total Prescribed Dose: 42.56 Gy
Plan Total Prescribed Dose: 42.56 Gy
Reference Point Dosage Given to Date: 26.6 Gy
Reference Point Dosage Given to Date: 26.6 Gy
Reference Point Session Dosage Given: 2.66 Gy
Reference Point Session Dosage Given: 2.66 Gy
Session Number: 10

## 2022-08-19 LAB — CBC
HCT: 41.1 % (ref 36.0–46.0)
Hemoglobin: 14 g/dL (ref 12.0–15.0)
MCH: 31.3 pg (ref 26.0–34.0)
MCHC: 34.1 g/dL (ref 30.0–36.0)
MCV: 91.7 fL (ref 80.0–100.0)
Platelets: 259 10*3/uL (ref 150–400)
RBC: 4.48 MIL/uL (ref 3.87–5.11)
RDW: 12.8 % (ref 11.5–15.5)
WBC: 6.7 10*3/uL (ref 4.0–10.5)
nRBC: 0 % (ref 0.0–0.2)

## 2022-08-20 ENCOUNTER — Ambulatory Visit
Admission: RE | Admit: 2022-08-20 | Discharge: 2022-08-20 | Disposition: A | Discharge status: Home / Self Care | Payer: Medicare HMO | Source: Ambulatory Visit | Attending: Radiation Oncology | Admitting: Radiation Oncology

## 2022-08-20 ENCOUNTER — Other Ambulatory Visit: Payer: Self-pay

## 2022-08-20 DIAGNOSIS — C50411 Malignant neoplasm of upper-outer quadrant of right female breast: Secondary | ICD-10-CM | POA: Diagnosis not present

## 2022-08-20 DIAGNOSIS — C50412 Malignant neoplasm of upper-outer quadrant of left female breast: Secondary | ICD-10-CM | POA: Diagnosis not present

## 2022-08-20 DIAGNOSIS — Z510 Encounter for antineoplastic radiation therapy: Secondary | ICD-10-CM | POA: Diagnosis not present

## 2022-08-20 DIAGNOSIS — Z170 Estrogen receptor positive status [ER+]: Secondary | ICD-10-CM | POA: Diagnosis not present

## 2022-08-20 LAB — RAD ONC ARIA SESSION SUMMARY
Course Elapsed Days: 14
Plan Fractions Treated to Date: 11
Plan Fractions Treated to Date: 11
Plan Prescribed Dose Per Fraction: 2.66 Gy
Plan Prescribed Dose Per Fraction: 2.66 Gy
Plan Total Fractions Prescribed: 16
Plan Total Fractions Prescribed: 16
Plan Total Prescribed Dose: 42.56 Gy
Plan Total Prescribed Dose: 42.56 Gy
Reference Point Dosage Given to Date: 29.26 Gy
Reference Point Dosage Given to Date: 29.26 Gy
Reference Point Session Dosage Given: 2.66 Gy
Reference Point Session Dosage Given: 2.66 Gy
Session Number: 11

## 2022-08-21 ENCOUNTER — Other Ambulatory Visit: Payer: Self-pay

## 2022-08-21 ENCOUNTER — Ambulatory Visit
Admission: RE | Admit: 2022-08-21 | Discharge: 2022-08-21 | Disposition: A | Discharge status: Home / Self Care | Payer: Medicare HMO | Source: Ambulatory Visit | Attending: Radiation Oncology | Admitting: Radiation Oncology

## 2022-08-21 DIAGNOSIS — Z510 Encounter for antineoplastic radiation therapy: Secondary | ICD-10-CM | POA: Diagnosis not present

## 2022-08-21 DIAGNOSIS — Z170 Estrogen receptor positive status [ER+]: Secondary | ICD-10-CM | POA: Diagnosis not present

## 2022-08-21 DIAGNOSIS — C50411 Malignant neoplasm of upper-outer quadrant of right female breast: Secondary | ICD-10-CM | POA: Diagnosis not present

## 2022-08-21 DIAGNOSIS — H401133 Primary open-angle glaucoma, bilateral, severe stage: Secondary | ICD-10-CM | POA: Diagnosis not present

## 2022-08-21 DIAGNOSIS — C50412 Malignant neoplasm of upper-outer quadrant of left female breast: Secondary | ICD-10-CM | POA: Diagnosis not present

## 2022-08-21 LAB — RAD ONC ARIA SESSION SUMMARY
Course Elapsed Days: 15
Plan Fractions Treated to Date: 12
Plan Fractions Treated to Date: 12
Plan Prescribed Dose Per Fraction: 2.66 Gy
Plan Prescribed Dose Per Fraction: 2.66 Gy
Plan Total Fractions Prescribed: 16
Plan Total Fractions Prescribed: 16
Plan Total Prescribed Dose: 42.56 Gy
Plan Total Prescribed Dose: 42.56 Gy
Reference Point Dosage Given to Date: 31.92 Gy
Reference Point Dosage Given to Date: 31.92 Gy
Reference Point Session Dosage Given: 2.66 Gy
Reference Point Session Dosage Given: 2.66 Gy
Session Number: 12

## 2022-08-21 LAB — HM DIABETES EYE EXAM

## 2022-08-24 ENCOUNTER — Other Ambulatory Visit: Payer: Self-pay

## 2022-08-24 ENCOUNTER — Ambulatory Visit
Admission: RE | Admit: 2022-08-24 | Discharge: 2022-08-24 | Disposition: A | Discharge status: Home / Self Care | Payer: Medicare HMO | Source: Ambulatory Visit | Attending: Radiation Oncology | Admitting: Radiation Oncology

## 2022-08-24 DIAGNOSIS — Z170 Estrogen receptor positive status [ER+]: Secondary | ICD-10-CM | POA: Diagnosis not present

## 2022-08-24 DIAGNOSIS — Z510 Encounter for antineoplastic radiation therapy: Secondary | ICD-10-CM | POA: Diagnosis not present

## 2022-08-24 DIAGNOSIS — C50412 Malignant neoplasm of upper-outer quadrant of left female breast: Secondary | ICD-10-CM | POA: Diagnosis not present

## 2022-08-24 DIAGNOSIS — C50411 Malignant neoplasm of upper-outer quadrant of right female breast: Secondary | ICD-10-CM | POA: Diagnosis not present

## 2022-08-24 LAB — RAD ONC ARIA SESSION SUMMARY
Course Elapsed Days: 18
Plan Fractions Treated to Date: 13
Plan Fractions Treated to Date: 13
Plan Prescribed Dose Per Fraction: 2.66 Gy
Plan Prescribed Dose Per Fraction: 2.66 Gy
Plan Total Fractions Prescribed: 16
Plan Total Fractions Prescribed: 16
Plan Total Prescribed Dose: 42.56 Gy
Plan Total Prescribed Dose: 42.56 Gy
Reference Point Dosage Given to Date: 34.58 Gy
Reference Point Dosage Given to Date: 34.58 Gy
Reference Point Session Dosage Given: 2.66 Gy
Reference Point Session Dosage Given: 2.66 Gy
Session Number: 13

## 2022-08-25 ENCOUNTER — Ambulatory Visit
Admission: RE | Admit: 2022-08-25 | Discharge: 2022-08-25 | Disposition: A | Discharge status: Home / Self Care | Payer: Medicare HMO | Source: Ambulatory Visit | Attending: Radiation Oncology | Admitting: Radiation Oncology

## 2022-08-25 ENCOUNTER — Other Ambulatory Visit: Payer: Self-pay

## 2022-08-25 DIAGNOSIS — C50411 Malignant neoplasm of upper-outer quadrant of right female breast: Secondary | ICD-10-CM | POA: Diagnosis not present

## 2022-08-25 DIAGNOSIS — Z170 Estrogen receptor positive status [ER+]: Secondary | ICD-10-CM | POA: Diagnosis not present

## 2022-08-25 DIAGNOSIS — C50412 Malignant neoplasm of upper-outer quadrant of left female breast: Secondary | ICD-10-CM | POA: Diagnosis not present

## 2022-08-25 DIAGNOSIS — Z510 Encounter for antineoplastic radiation therapy: Secondary | ICD-10-CM | POA: Diagnosis not present

## 2022-08-25 LAB — RAD ONC ARIA SESSION SUMMARY
Course Elapsed Days: 19
Plan Fractions Treated to Date: 14
Plan Fractions Treated to Date: 14
Plan Prescribed Dose Per Fraction: 2.66 Gy
Plan Prescribed Dose Per Fraction: 2.66 Gy
Plan Total Fractions Prescribed: 16
Plan Total Fractions Prescribed: 16
Plan Total Prescribed Dose: 42.56 Gy
Plan Total Prescribed Dose: 42.56 Gy
Reference Point Dosage Given to Date: 37.24 Gy
Reference Point Dosage Given to Date: 37.24 Gy
Reference Point Session Dosage Given: 2.66 Gy
Reference Point Session Dosage Given: 2.66 Gy
Session Number: 14

## 2022-08-26 ENCOUNTER — Other Ambulatory Visit: Payer: Self-pay

## 2022-08-26 ENCOUNTER — Ambulatory Visit
Admission: RE | Admit: 2022-08-26 | Discharge: 2022-08-26 | Disposition: A | Discharge status: Home / Self Care | Payer: Medicare HMO | Source: Ambulatory Visit | Attending: Radiation Oncology | Admitting: Radiation Oncology

## 2022-08-26 DIAGNOSIS — Z510 Encounter for antineoplastic radiation therapy: Secondary | ICD-10-CM | POA: Diagnosis not present

## 2022-08-26 DIAGNOSIS — Z170 Estrogen receptor positive status [ER+]: Secondary | ICD-10-CM | POA: Diagnosis not present

## 2022-08-26 DIAGNOSIS — C50411 Malignant neoplasm of upper-outer quadrant of right female breast: Secondary | ICD-10-CM | POA: Diagnosis not present

## 2022-08-26 DIAGNOSIS — C50412 Malignant neoplasm of upper-outer quadrant of left female breast: Secondary | ICD-10-CM | POA: Diagnosis not present

## 2022-08-26 LAB — RAD ONC ARIA SESSION SUMMARY
Course Elapsed Days: 20
Plan Fractions Treated to Date: 15
Plan Fractions Treated to Date: 15
Plan Prescribed Dose Per Fraction: 2.66 Gy
Plan Prescribed Dose Per Fraction: 2.66 Gy
Plan Total Fractions Prescribed: 16
Plan Total Fractions Prescribed: 16
Plan Total Prescribed Dose: 42.56 Gy
Plan Total Prescribed Dose: 42.56 Gy
Reference Point Dosage Given to Date: 39.9 Gy
Reference Point Dosage Given to Date: 39.9 Gy
Reference Point Session Dosage Given: 2.66 Gy
Reference Point Session Dosage Given: 2.66 Gy
Session Number: 15

## 2022-08-27 ENCOUNTER — Ambulatory Visit
Admission: RE | Admit: 2022-08-27 | Discharge: 2022-08-27 | Disposition: A | Discharge status: Home / Self Care | Payer: Medicare HMO | Source: Ambulatory Visit | Attending: Radiation Oncology | Admitting: Radiation Oncology

## 2022-08-27 ENCOUNTER — Other Ambulatory Visit: Payer: Self-pay

## 2022-08-27 DIAGNOSIS — Z510 Encounter for antineoplastic radiation therapy: Secondary | ICD-10-CM | POA: Diagnosis not present

## 2022-08-27 DIAGNOSIS — Z170 Estrogen receptor positive status [ER+]: Secondary | ICD-10-CM | POA: Diagnosis not present

## 2022-08-27 DIAGNOSIS — C50412 Malignant neoplasm of upper-outer quadrant of left female breast: Secondary | ICD-10-CM | POA: Diagnosis not present

## 2022-08-27 DIAGNOSIS — C50411 Malignant neoplasm of upper-outer quadrant of right female breast: Secondary | ICD-10-CM | POA: Diagnosis not present

## 2022-08-27 LAB — RAD ONC ARIA SESSION SUMMARY
Course Elapsed Days: 21
Plan Fractions Treated to Date: 16
Plan Fractions Treated to Date: 16
Plan Prescribed Dose Per Fraction: 2.66 Gy
Plan Prescribed Dose Per Fraction: 2.66 Gy
Plan Total Fractions Prescribed: 16
Plan Total Fractions Prescribed: 16
Plan Total Prescribed Dose: 42.56 Gy
Plan Total Prescribed Dose: 42.56 Gy
Reference Point Dosage Given to Date: 42.56 Gy
Reference Point Dosage Given to Date: 42.56 Gy
Reference Point Session Dosage Given: 2.66 Gy
Reference Point Session Dosage Given: 2.66 Gy
Session Number: 16

## 2022-08-28 ENCOUNTER — Other Ambulatory Visit: Payer: Self-pay

## 2022-08-28 ENCOUNTER — Ambulatory Visit
Admission: RE | Admit: 2022-08-28 | Discharge: 2022-08-28 | Disposition: A | Discharge status: Home / Self Care | Payer: Medicare HMO | Source: Ambulatory Visit | Attending: Radiation Oncology | Admitting: Radiation Oncology

## 2022-08-28 DIAGNOSIS — Z510 Encounter for antineoplastic radiation therapy: Secondary | ICD-10-CM | POA: Insufficient documentation

## 2022-08-28 DIAGNOSIS — C50411 Malignant neoplasm of upper-outer quadrant of right female breast: Secondary | ICD-10-CM | POA: Diagnosis not present

## 2022-08-28 DIAGNOSIS — Z170 Estrogen receptor positive status [ER+]: Secondary | ICD-10-CM | POA: Insufficient documentation

## 2022-08-28 DIAGNOSIS — C50412 Malignant neoplasm of upper-outer quadrant of left female breast: Secondary | ICD-10-CM | POA: Insufficient documentation

## 2022-08-28 LAB — RAD ONC ARIA SESSION SUMMARY
Course Elapsed Days: 22
Plan Fractions Treated to Date: 1
Plan Fractions Treated to Date: 1
Plan Prescribed Dose Per Fraction: 2 Gy
Plan Prescribed Dose Per Fraction: 2 Gy
Plan Total Fractions Prescribed: 8
Plan Total Fractions Prescribed: 8
Plan Total Prescribed Dose: 16 Gy
Plan Total Prescribed Dose: 16 Gy
Reference Point Dosage Given to Date: 2 Gy
Reference Point Dosage Given to Date: 2 Gy
Reference Point Session Dosage Given: 2 Gy
Reference Point Session Dosage Given: 2 Gy
Session Number: 17

## 2022-09-01 ENCOUNTER — Other Ambulatory Visit: Payer: Self-pay

## 2022-09-01 ENCOUNTER — Ambulatory Visit: Payer: Medicare HMO

## 2022-09-01 ENCOUNTER — Ambulatory Visit
Admission: RE | Admit: 2022-09-01 | Discharge: 2022-09-01 | Disposition: A | Discharge status: Home / Self Care | Payer: Medicare HMO | Source: Ambulatory Visit | Attending: Radiation Oncology | Admitting: Radiation Oncology

## 2022-09-01 DIAGNOSIS — Z170 Estrogen receptor positive status [ER+]: Secondary | ICD-10-CM | POA: Diagnosis not present

## 2022-09-01 DIAGNOSIS — Z510 Encounter for antineoplastic radiation therapy: Secondary | ICD-10-CM | POA: Diagnosis not present

## 2022-09-01 DIAGNOSIS — C50412 Malignant neoplasm of upper-outer quadrant of left female breast: Secondary | ICD-10-CM | POA: Diagnosis not present

## 2022-09-01 DIAGNOSIS — C50411 Malignant neoplasm of upper-outer quadrant of right female breast: Secondary | ICD-10-CM | POA: Diagnosis not present

## 2022-09-01 LAB — RAD ONC ARIA SESSION SUMMARY
Course Elapsed Days: 26
Plan Fractions Treated to Date: 2
Plan Fractions Treated to Date: 2
Plan Prescribed Dose Per Fraction: 2 Gy
Plan Prescribed Dose Per Fraction: 2 Gy
Plan Total Fractions Prescribed: 8
Plan Total Fractions Prescribed: 8
Plan Total Prescribed Dose: 16 Gy
Plan Total Prescribed Dose: 16 Gy
Reference Point Dosage Given to Date: 4 Gy
Reference Point Dosage Given to Date: 4 Gy
Reference Point Session Dosage Given: 2 Gy
Reference Point Session Dosage Given: 2 Gy
Session Number: 18

## 2022-09-02 ENCOUNTER — Inpatient Hospital Stay: Payer: Medicare HMO | Attending: Internal Medicine

## 2022-09-02 ENCOUNTER — Ambulatory Visit
Admission: RE | Admit: 2022-09-02 | Discharge: 2022-09-02 | Disposition: A | Discharge status: Home / Self Care | Payer: Medicare HMO | Source: Ambulatory Visit | Attending: Radiation Oncology | Admitting: Radiation Oncology

## 2022-09-02 ENCOUNTER — Ambulatory Visit: Payer: Medicare HMO

## 2022-09-02 ENCOUNTER — Other Ambulatory Visit: Payer: Self-pay

## 2022-09-02 DIAGNOSIS — E785 Hyperlipidemia, unspecified: Secondary | ICD-10-CM | POA: Diagnosis not present

## 2022-09-02 DIAGNOSIS — Z170 Estrogen receptor positive status [ER+]: Secondary | ICD-10-CM | POA: Diagnosis not present

## 2022-09-02 DIAGNOSIS — Z510 Encounter for antineoplastic radiation therapy: Secondary | ICD-10-CM | POA: Diagnosis not present

## 2022-09-02 DIAGNOSIS — E119 Type 2 diabetes mellitus without complications: Secondary | ICD-10-CM | POA: Diagnosis not present

## 2022-09-02 DIAGNOSIS — E669 Obesity, unspecified: Secondary | ICD-10-CM | POA: Diagnosis not present

## 2022-09-02 DIAGNOSIS — Z8616 Personal history of COVID-19: Secondary | ICD-10-CM | POA: Insufficient documentation

## 2022-09-02 DIAGNOSIS — D0512 Intraductal carcinoma in situ of left breast: Secondary | ICD-10-CM | POA: Diagnosis not present

## 2022-09-02 DIAGNOSIS — Z800 Family history of malignant neoplasm of digestive organs: Secondary | ICD-10-CM | POA: Insufficient documentation

## 2022-09-02 DIAGNOSIS — Z79899 Other long term (current) drug therapy: Secondary | ICD-10-CM | POA: Insufficient documentation

## 2022-09-02 DIAGNOSIS — M129 Arthropathy, unspecified: Secondary | ICD-10-CM | POA: Diagnosis not present

## 2022-09-02 DIAGNOSIS — M8580 Other specified disorders of bone density and structure, unspecified site: Secondary | ICD-10-CM | POA: Diagnosis not present

## 2022-09-02 DIAGNOSIS — K219 Gastro-esophageal reflux disease without esophagitis: Secondary | ICD-10-CM | POA: Insufficient documentation

## 2022-09-02 DIAGNOSIS — Z794 Long term (current) use of insulin: Secondary | ICD-10-CM | POA: Diagnosis not present

## 2022-09-02 DIAGNOSIS — Z7984 Long term (current) use of oral hypoglycemic drugs: Secondary | ICD-10-CM | POA: Diagnosis not present

## 2022-09-02 DIAGNOSIS — C50412 Malignant neoplasm of upper-outer quadrant of left female breast: Secondary | ICD-10-CM | POA: Diagnosis not present

## 2022-09-02 DIAGNOSIS — Z801 Family history of malignant neoplasm of trachea, bronchus and lung: Secondary | ICD-10-CM | POA: Diagnosis not present

## 2022-09-02 DIAGNOSIS — Z79811 Long term (current) use of aromatase inhibitors: Secondary | ICD-10-CM | POA: Diagnosis not present

## 2022-09-02 DIAGNOSIS — E039 Hypothyroidism, unspecified: Secondary | ICD-10-CM | POA: Insufficient documentation

## 2022-09-02 DIAGNOSIS — C50411 Malignant neoplasm of upper-outer quadrant of right female breast: Secondary | ICD-10-CM | POA: Diagnosis not present

## 2022-09-02 LAB — RAD ONC ARIA SESSION SUMMARY
Course Elapsed Days: 27
Plan Fractions Treated to Date: 3
Plan Fractions Treated to Date: 3
Plan Prescribed Dose Per Fraction: 2 Gy
Plan Prescribed Dose Per Fraction: 2 Gy
Plan Total Fractions Prescribed: 8
Plan Total Fractions Prescribed: 8
Plan Total Prescribed Dose: 16 Gy
Plan Total Prescribed Dose: 16 Gy
Reference Point Dosage Given to Date: 6 Gy
Reference Point Dosage Given to Date: 6 Gy
Reference Point Session Dosage Given: 2 Gy
Reference Point Session Dosage Given: 2 Gy
Session Number: 19

## 2022-09-02 LAB — CBC
HCT: 42 % (ref 36.0–46.0)
Hemoglobin: 14.3 g/dL (ref 12.0–15.0)
MCH: 31.6 pg (ref 26.0–34.0)
MCHC: 34 g/dL (ref 30.0–36.0)
MCV: 92.7 fL (ref 80.0–100.0)
Platelets: 216 10*3/uL (ref 150–400)
RBC: 4.53 MIL/uL (ref 3.87–5.11)
RDW: 12.9 % (ref 11.5–15.5)
WBC: 6.6 10*3/uL (ref 4.0–10.5)
nRBC: 0 % (ref 0.0–0.2)

## 2022-09-03 ENCOUNTER — Ambulatory Visit
Admission: RE | Admit: 2022-09-03 | Discharge: 2022-09-03 | Disposition: A | Discharge status: Home / Self Care | Payer: Medicare HMO | Source: Ambulatory Visit | Attending: Radiation Oncology | Admitting: Radiation Oncology

## 2022-09-03 ENCOUNTER — Ambulatory Visit: Payer: Medicare HMO

## 2022-09-03 ENCOUNTER — Other Ambulatory Visit: Payer: Self-pay

## 2022-09-03 DIAGNOSIS — C50412 Malignant neoplasm of upper-outer quadrant of left female breast: Secondary | ICD-10-CM | POA: Diagnosis not present

## 2022-09-03 DIAGNOSIS — C50411 Malignant neoplasm of upper-outer quadrant of right female breast: Secondary | ICD-10-CM | POA: Diagnosis not present

## 2022-09-03 DIAGNOSIS — Z170 Estrogen receptor positive status [ER+]: Secondary | ICD-10-CM | POA: Diagnosis not present

## 2022-09-03 DIAGNOSIS — Z510 Encounter for antineoplastic radiation therapy: Secondary | ICD-10-CM | POA: Diagnosis not present

## 2022-09-03 LAB — RAD ONC ARIA SESSION SUMMARY
Course Elapsed Days: 28
Plan Fractions Treated to Date: 4
Plan Fractions Treated to Date: 4
Plan Prescribed Dose Per Fraction: 2 Gy
Plan Prescribed Dose Per Fraction: 2 Gy
Plan Total Fractions Prescribed: 8
Plan Total Fractions Prescribed: 8
Plan Total Prescribed Dose: 16 Gy
Plan Total Prescribed Dose: 16 Gy
Reference Point Dosage Given to Date: 8 Gy
Reference Point Dosage Given to Date: 8 Gy
Reference Point Session Dosage Given: 2 Gy
Reference Point Session Dosage Given: 2 Gy
Session Number: 20

## 2022-09-04 ENCOUNTER — Ambulatory Visit: Payer: Medicare HMO

## 2022-09-04 ENCOUNTER — Other Ambulatory Visit: Payer: Self-pay

## 2022-09-04 ENCOUNTER — Ambulatory Visit
Admission: RE | Admit: 2022-09-04 | Discharge: 2022-09-04 | Disposition: A | Discharge status: Home / Self Care | Payer: Medicare HMO | Source: Ambulatory Visit | Attending: Radiation Oncology | Admitting: Radiation Oncology

## 2022-09-04 DIAGNOSIS — Z510 Encounter for antineoplastic radiation therapy: Secondary | ICD-10-CM | POA: Diagnosis not present

## 2022-09-04 DIAGNOSIS — C50412 Malignant neoplasm of upper-outer quadrant of left female breast: Secondary | ICD-10-CM | POA: Diagnosis not present

## 2022-09-04 DIAGNOSIS — Z170 Estrogen receptor positive status [ER+]: Secondary | ICD-10-CM | POA: Diagnosis not present

## 2022-09-04 DIAGNOSIS — C50411 Malignant neoplasm of upper-outer quadrant of right female breast: Secondary | ICD-10-CM | POA: Diagnosis not present

## 2022-09-04 LAB — RAD ONC ARIA SESSION SUMMARY
Course Elapsed Days: 29
Plan Fractions Treated to Date: 5
Plan Fractions Treated to Date: 5
Plan Prescribed Dose Per Fraction: 2 Gy
Plan Prescribed Dose Per Fraction: 2 Gy
Plan Total Fractions Prescribed: 8
Plan Total Fractions Prescribed: 8
Plan Total Prescribed Dose: 16 Gy
Plan Total Prescribed Dose: 16 Gy
Reference Point Dosage Given to Date: 10 Gy
Reference Point Dosage Given to Date: 10 Gy
Reference Point Session Dosage Given: 2 Gy
Reference Point Session Dosage Given: 2 Gy
Session Number: 21

## 2022-09-07 ENCOUNTER — Other Ambulatory Visit: Payer: Self-pay

## 2022-09-07 ENCOUNTER — Ambulatory Visit
Admission: RE | Admit: 2022-09-07 | Discharge: 2022-09-07 | Disposition: A | Discharge status: Home / Self Care | Payer: Medicare HMO | Source: Ambulatory Visit | Attending: Radiation Oncology | Admitting: Radiation Oncology

## 2022-09-07 DIAGNOSIS — C50412 Malignant neoplasm of upper-outer quadrant of left female breast: Secondary | ICD-10-CM | POA: Diagnosis not present

## 2022-09-07 DIAGNOSIS — Z170 Estrogen receptor positive status [ER+]: Secondary | ICD-10-CM | POA: Diagnosis not present

## 2022-09-07 DIAGNOSIS — D692 Other nonthrombocytopenic purpura: Secondary | ICD-10-CM | POA: Diagnosis not present

## 2022-09-07 DIAGNOSIS — Z510 Encounter for antineoplastic radiation therapy: Secondary | ICD-10-CM | POA: Diagnosis not present

## 2022-09-07 DIAGNOSIS — C50411 Malignant neoplasm of upper-outer quadrant of right female breast: Secondary | ICD-10-CM | POA: Diagnosis not present

## 2022-09-07 LAB — RAD ONC ARIA SESSION SUMMARY
Course Elapsed Days: 32
Plan Fractions Treated to Date: 6
Plan Fractions Treated to Date: 6
Plan Prescribed Dose Per Fraction: 2 Gy
Plan Prescribed Dose Per Fraction: 2 Gy
Plan Total Fractions Prescribed: 8
Plan Total Fractions Prescribed: 8
Plan Total Prescribed Dose: 16 Gy
Plan Total Prescribed Dose: 16 Gy
Reference Point Dosage Given to Date: 12 Gy
Reference Point Dosage Given to Date: 12 Gy
Reference Point Session Dosage Given: 2 Gy
Reference Point Session Dosage Given: 2 Gy
Session Number: 22

## 2022-09-08 ENCOUNTER — Other Ambulatory Visit: Payer: Self-pay

## 2022-09-08 ENCOUNTER — Ambulatory Visit
Admission: RE | Admit: 2022-09-08 | Discharge: 2022-09-08 | Disposition: A | Discharge status: Home / Self Care | Payer: Medicare HMO | Source: Ambulatory Visit | Attending: Radiation Oncology | Admitting: Radiation Oncology

## 2022-09-08 DIAGNOSIS — C50411 Malignant neoplasm of upper-outer quadrant of right female breast: Secondary | ICD-10-CM | POA: Diagnosis not present

## 2022-09-08 DIAGNOSIS — Z170 Estrogen receptor positive status [ER+]: Secondary | ICD-10-CM | POA: Diagnosis not present

## 2022-09-08 DIAGNOSIS — Z510 Encounter for antineoplastic radiation therapy: Secondary | ICD-10-CM | POA: Diagnosis not present

## 2022-09-08 DIAGNOSIS — C50412 Malignant neoplasm of upper-outer quadrant of left female breast: Secondary | ICD-10-CM | POA: Diagnosis not present

## 2022-09-08 LAB — RAD ONC ARIA SESSION SUMMARY
Course Elapsed Days: 33
Plan Fractions Treated to Date: 7
Plan Fractions Treated to Date: 7
Plan Prescribed Dose Per Fraction: 2 Gy
Plan Prescribed Dose Per Fraction: 2 Gy
Plan Total Fractions Prescribed: 8
Plan Total Fractions Prescribed: 8
Plan Total Prescribed Dose: 16 Gy
Plan Total Prescribed Dose: 16 Gy
Reference Point Dosage Given to Date: 14 Gy
Reference Point Dosage Given to Date: 14 Gy
Reference Point Session Dosage Given: 2 Gy
Reference Point Session Dosage Given: 2 Gy
Session Number: 23

## 2022-09-09 ENCOUNTER — Other Ambulatory Visit: Payer: Self-pay

## 2022-09-09 ENCOUNTER — Encounter: Payer: Self-pay | Admitting: *Deleted

## 2022-09-09 ENCOUNTER — Inpatient Hospital Stay: Payer: Medicare HMO | Admitting: Internal Medicine

## 2022-09-09 ENCOUNTER — Ambulatory Visit
Admission: RE | Admit: 2022-09-09 | Discharge: 2022-09-09 | Disposition: A | Discharge status: Home / Self Care | Payer: Medicare HMO | Source: Ambulatory Visit | Attending: Radiation Oncology | Admitting: Radiation Oncology

## 2022-09-09 ENCOUNTER — Encounter: Payer: Self-pay | Admitting: Internal Medicine

## 2022-09-09 DIAGNOSIS — Z794 Long term (current) use of insulin: Secondary | ICD-10-CM | POA: Diagnosis not present

## 2022-09-09 DIAGNOSIS — E039 Hypothyroidism, unspecified: Secondary | ICD-10-CM | POA: Diagnosis not present

## 2022-09-09 DIAGNOSIS — Z8616 Personal history of COVID-19: Secondary | ICD-10-CM | POA: Diagnosis not present

## 2022-09-09 DIAGNOSIS — Z170 Estrogen receptor positive status [ER+]: Secondary | ICD-10-CM | POA: Diagnosis not present

## 2022-09-09 DIAGNOSIS — M8580 Other specified disorders of bone density and structure, unspecified site: Secondary | ICD-10-CM | POA: Diagnosis not present

## 2022-09-09 DIAGNOSIS — E669 Obesity, unspecified: Secondary | ICD-10-CM | POA: Diagnosis not present

## 2022-09-09 DIAGNOSIS — E785 Hyperlipidemia, unspecified: Secondary | ICD-10-CM | POA: Diagnosis not present

## 2022-09-09 DIAGNOSIS — Z79899 Other long term (current) drug therapy: Secondary | ICD-10-CM | POA: Diagnosis not present

## 2022-09-09 DIAGNOSIS — C50411 Malignant neoplasm of upper-outer quadrant of right female breast: Secondary | ICD-10-CM | POA: Diagnosis not present

## 2022-09-09 DIAGNOSIS — Z801 Family history of malignant neoplasm of trachea, bronchus and lung: Secondary | ICD-10-CM | POA: Diagnosis not present

## 2022-09-09 DIAGNOSIS — Z510 Encounter for antineoplastic radiation therapy: Secondary | ICD-10-CM | POA: Diagnosis not present

## 2022-09-09 DIAGNOSIS — Z79811 Long term (current) use of aromatase inhibitors: Secondary | ICD-10-CM | POA: Diagnosis not present

## 2022-09-09 DIAGNOSIS — M129 Arthropathy, unspecified: Secondary | ICD-10-CM | POA: Diagnosis not present

## 2022-09-09 DIAGNOSIS — Z7984 Long term (current) use of oral hypoglycemic drugs: Secondary | ICD-10-CM | POA: Diagnosis not present

## 2022-09-09 DIAGNOSIS — E119 Type 2 diabetes mellitus without complications: Secondary | ICD-10-CM | POA: Diagnosis not present

## 2022-09-09 DIAGNOSIS — C50412 Malignant neoplasm of upper-outer quadrant of left female breast: Secondary | ICD-10-CM | POA: Diagnosis not present

## 2022-09-09 DIAGNOSIS — Z800 Family history of malignant neoplasm of digestive organs: Secondary | ICD-10-CM | POA: Diagnosis not present

## 2022-09-09 DIAGNOSIS — D0512 Intraductal carcinoma in situ of left breast: Secondary | ICD-10-CM | POA: Diagnosis not present

## 2022-09-09 DIAGNOSIS — K219 Gastro-esophageal reflux disease without esophagitis: Secondary | ICD-10-CM | POA: Diagnosis not present

## 2022-09-09 LAB — RAD ONC ARIA SESSION SUMMARY
Course Elapsed Days: 34
Plan Fractions Treated to Date: 8
Plan Fractions Treated to Date: 8
Plan Prescribed Dose Per Fraction: 2 Gy
Plan Prescribed Dose Per Fraction: 2 Gy
Plan Total Fractions Prescribed: 8
Plan Total Fractions Prescribed: 8
Plan Total Prescribed Dose: 16 Gy
Plan Total Prescribed Dose: 16 Gy
Reference Point Dosage Given to Date: 16 Gy
Reference Point Dosage Given to Date: 16 Gy
Reference Point Session Dosage Given: 2 Gy
Reference Point Session Dosage Given: 2 Gy
Session Number: 24

## 2022-09-09 MED ORDER — ANASTROZOLE 1 MG PO TABS: 1.0000 mg | ORAL_TABLET | Freq: Every day | ORAL | 5 refills | Status: DC

## 2022-09-09 NOTE — Assessment & Plan Note (Addendum)
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 PN=0; G-2. ?  Focal involvement of the margin by DCIS; invasive carcinoma negative margins. Oncotype-RS =8; low risk.  No role for any chemotherapy.   # Left breast DCIS status postlumpectomy-proceed with adjuvant radiation.   #Patient currently undergoing radiation bilateral.  Last radiation today.  Patient will need adjuvant endocrine therapy after finishing adjuvant radiation.  # I again reviewed and discussed the role of endocrine therapy-given ER/PR positive disease.  Patient will be offered antihormone pill 1 a day for 5 years.  Discussed potential downsides including but not limited to arthralgias hot flashes and osteoporosis.  Patient had severe hot flashes after her natural menopause.  Discussed that this will also help prevent developing any cancers in the contralateral breast/DCIS recurrence.  Discussed regarding starting mid October 2023 [1 month postradiation]; prescription for anastrozole sent.   #Osteopenia: BMD March 2023- T-score of -1.9.  On ca+vit D; Discussed the potential risk factors for osteoporosis- age/gender/postmenopausal status/use of anti-estrogen treatments. Discussed multiple options including exercise/ calcium and vitamin D supplementation.   # Obesity-  Discussed importance of healthy weight/and weight loss.  Strongly recommend eating more green leafy vegetables and cutting down processed food/ carbohydrates.  Instead increasing whole grains / protein in the diet.  Multiple studies have shown that optimal weight would help improve cardiovascular risk; also shown to cut on the risk of malignancies-colon cancer, breast cancer ovarian/uterine cancer in women.  Patient has lost weight in the past.  She is motivated.  Recommend evaluation with obesity medicine physician/use of GLP-1 agonists.   # DISPOSITION: # follow up in 2 months  MD; no labs-Dr.B

## 2022-09-09 NOTE — Progress Notes (Signed)
No concerns. 

## 2022-09-09 NOTE — Progress Notes (Signed)
[facility] [place] NOTE  Patient Care Team: Pleas Koch, NP as PCP - General (Internal Medicine) Ammie Dalton, Okey Regal, MD as Consulting Physician (Obstetrics and Gynecology) Bary Castilla, Forest Gleason, MD (General Surgery) Kennith Center, RD as Dietitian (Family Medicine) Theodore Demark, RN (Inactive) as Oncology Nurse Navigator Daiva Huge, RN as Oncology Nurse Navigator Cammie Sickle, MD as Consulting Physician (Oncology)  CHIEF COMPLAINTS/PURPOSE OF CONSULTATION: Breast cancer  #  Oncology History Overview Note  IMPRESSION: 1. 1.3 cm irregular mass with associated distortion in the right breast at 11 o'clock, 2 cm the nipple, highly suspicious for malignancy. No abnormal right axillary lymph nodes.   RECOMMENDATION: 1. Ultrasound-guided core needle biopsy of the right breast highly suspicious mass. A.  BREAST MASS, RIGHT 11:00 2 CM FN; ULTRASOUND-GUIDED BIOPSY:  - INVASIVE MAMMARY CARCINOMA WITH LOBULAR FEATURES.   Size of invasive carcinoma: 8 mm in this sample  Histologic grade of invasive carcinoma: Grade 2                       Glandular/tubular differentiation score: 3                       Nuclear pleomorphism score: 2                       Mitotic rate score: 1                       Total score: 6  Ductal carcinoma in situ: Present, intermediate grade  Lymphovascular invasion: Not identified   ER/PR/HER2: Immunohistochemistry will be performed on block A2, with  reflex to [place] for HER2 2+. The results will be reported in an addendum.   Comment:  The definitive grade will be assigned on the excisional specimen.   GROSS DESCRIPTION:  A. Labeled: Right breast 11:00 2 cm from the nipple  Received: Formalin  Time/date in fixative: Collected and placed in formalin at 8:30 AM on  04/28/2022  Cold ischemic time: Less than 1 minute  Total fixation time: Approximately 9 hours  Core pieces: 4  Size: Ranges from 0.5-2.0 cm in length and 0.2 cm in  diameter  Description: Tan cores of fibrofatty tissue  Ink color: Black  Entirely submitted in 2 cassettes with 2 cores in A1 and 2 cores in A2.   CM 04/28/2022   Final Diagnosis performed by Quay Burow, MD.   Electronically signed  04/29/2022 1:04:41PM  The electronic signature indicates that the named Attending Pathologist  has evaluated the specimen  Technical component performed at [facility], [address] [place],  [place] [address] Lab: [phone] Dir: Rush Farmer, MD, MMM   Professional component performed at [facility], [facility], [place], [employer], [place] [address] Lab: [phone]  Dir: Kathi Simpers, MD   ADDENDUM:  CASE SUMMARY: BREAST BIOMARKER TESTS  Estrogen Receptor (ER) Status: POSITIVE          Percentage of cells with nuclear positivity: Greater than 90%          Average intensity of staining: Strong   Progesterone Receptor (PgR) Status: POSITIVE          Percentage of cells with nuclear positivity: Greater than 90%          Average intensity of staining: Strong   HER2 (by immunohistochemistry): NEGATIVE (Score 0)  Ki-67: Not performed   #April  2023 clinical stage I -ER/PR positive HER2 =0 invasive mammary carcinoma with lobular features.  [Dr.Byrnett] May 2023 breast MRI-right side lesion-measuring 3.4 x 1.7 x 2.2 centimeters.     #May 2023 breast MRI showed-left breast indeterminate oval 1.5 centimeter mass in the UPPER-OUTER QUADRANT LEFT.  S/p biopsy positive for ER/PR positive DCIS. S/p lumpectomy  # Oncotype recurrence score 8-low risk; no chemotherapy  # Bilateral radiation-finished mid September 2023;   # Mid-OCT 2023- START Anastrazole      Carcinoma of upper-outer quadrant of right breast in female, estrogen receptor positive ([place])  05/06/2022 Initial Diagnosis   Carcinoma of upper-outer quadrant of right breast in female, estrogen receptor positive ([place])   05/06/2022 Cancer Staging   Staging form: Breast, AJCC  8th Edition - Clinical: Stage IA (cT1c, cN0, cM0, G2, ER+, PR+, HER2-) - Signed by Cammie Sickle, MD on 05/06/2022 Histologic grading system: 3 grade system    HISTORY OF PRESENTING ILLNESS: Ambulating independently.  Lone.   Valerie Gordon 71 y.o.  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  Patient complains of itching in the site of radiation.  Otherwise denies any infections.  She has been using Benadryl cream.  Postoperatively no complications noted.    Review of Systems  Constitutional:  Negative for chills, diaphoresis, fever, malaise/fatigue and weight loss.  HENT:  Negative for nosebleeds and sore throat.   Eyes:  Negative for double vision.  Respiratory:  Negative for cough, hemoptysis, sputum production, shortness of breath and wheezing.   Cardiovascular:  Negative for chest pain, palpitations, orthopnea and leg swelling.  Gastrointestinal:  Negative for abdominal pain, blood in stool, constipation, diarrhea, heartburn, melena, nausea and vomiting.  Genitourinary:  Negative for dysuria, frequency and urgency.  Musculoskeletal:  Negative for back pain and joint pain.  Skin: Negative.  Negative for itching and rash.  Neurological:  Negative for dizziness, tingling, focal weakness, weakness and headaches.  Endo/Heme/Allergies:  Does not bruise/bleed easily.  Psychiatric/Behavioral:  Negative for depression. The patient is not nervous/anxious and does not have insomnia.      MEDICAL HISTORY:  Past Medical History:  Diagnosis Date   Arthritis    lumbar spine   Breast cancer ([place])    Cataract    COVID-19 virus infection 10/14/2021   Depression    Gallstones    asyptomatic   GERD (gastroesophageal reflux disease)    Glaucoma    Headache    h/o migraines   Hyperlipidemia    Hypothyroidism    Obesity    Thyroid disease    Type 2 diabetes  mellitus ([place])    manages with diet and exercise    SURGICAL HISTORY: Past Surgical History:  Procedure Laterality Date   AXILLARY SENTINEL NODE BIOPSY Right 06/17/2022   Procedure: AXILLARY SENTINEL NODE BIOPSY;  Surgeon: Robert Bellow, MD;  Location: [facility] ORS;  Service: General;  Laterality: Right;   BREAST BIOPSY  12/28/2008   BREAST BIOPSY Right 04/28/2022   [place] core bx 11:00 coil clip -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   BREAST LUMPECTOMY WITH NEEDLE LOCALIZATION Bilateral 06/17/2022   Procedure: BREAST LUMPECTOMY WITH NEEDLE LOCALIZATION;  Surgeon: Robert Bellow, MD;  Location: [facility] ORS;  Service: General;  Laterality: Bilateral;   CARDIAC CATHETERIZATION  12/28/1996   and a glaucoma procedure as well that has a metal shunt in left eye   COLONOSCOPY  10/30/2004   COLONOSCOPY WITH PROPOFOL N/A 04/01/2016   Procedure: COLONOSCOPY WITH PROPOFOL;  Surgeon: Robert Bellow, MD;  Location: [facility] ENDOSCOPY;  Service: Endoscopy;  Laterality: N/A;   EYE SURGERY Bilateral    cataract sx   TONSILLECTOMY     WISDOM TOOTH EXTRACTION  1983    SOCIAL HISTORY: Social History   Socioeconomic History   Marital status: Widowed    Spouse name: Not on file   Number of children: Not on file   Years of education: Not on file   Highest education level: Not on file  Occupational History   Not on file  Tobacco Use   Smoking status: Never   Smokeless tobacco: Never  Vaping Use   Vaping Use: Never used  Substance and Sexual Activity   Alcohol use: Yes    Comment: occasional wine   Drug use: No   Sexual activity: Yes  Other Topics Concern   Not on file  Social History Narrative   Widower.   Step children.   Environmental health practitioner.   Enjoys riding hot air balloons, spending time with friends, reading, jewelry   Social  Determinants of Health   Financial Resource Strain: Low Risk  (12/01/2021)   Overall Financial Resource Strain (CARDIA)    Difficulty of Paying Living Expenses: Not hard at all  Food Insecurity: No Food Insecurity (12/01/2021)   Hunger Vital Sign    Worried About Running Out of Food in the Last Year: Never true    Ran Out of Food in the Last Year: Never true  Transportation Needs: No Transportation Needs (12/01/2021)   PRAPARE - Hydrologist (Medical): No    Lack of Transportation (Non-Medical): No  Physical Activity: Sufficiently Active (12/01/2021)   Exercise Vital Sign    Days of Exercise per Week: 4 days    Minutes of Exercise per Session: 40 min  Stress: No Stress Concern Present (12/01/2021)   [place]    Feeling of Stress : Not at all  Social Connections: Moderately Isolated (12/01/2021)   Social Connection and Isolation Panel [NHANES]    Frequency of Communication with Friends and Family: More than three times a week    Frequency of Social Gatherings with Friends and Family: Three times a week    Attends Religious Services: Never    Active Member of Clubs or Organizations: Yes    Attends Archivist Meetings: More than 4 times per year    Marital Status: Widowed  Intimate Partner Violence: Not At Risk (12/01/2021)   Humiliation, Afraid, Rape, and Kick questionnaire    Fear of Current or Ex-Partner: No    Emotionally Abused: No    Physically Abused: No    Sexually Abused: No    FAMILY HISTORY: Family History  Problem Relation Age of Onset   Stroke Mother    Heart disease Father    Lung cancer Maternal Grandmother    Stomach cancer Paternal Grandmother    Breast cancer Neg Hx     ALLERGIES:  is allergic to ciprofloxacin; trulicity [dulaglutide]; and antihistamines, chlorpheniramine-type.  MEDICATIONS:  Current Outpatient Medications  Medication Sig Dispense Refill    anastrozole (ARIMIDEX) 1 MG tablet Take 1 tablet (1 mg total) by mouth daily. 30 tablet 5   b complex vitamins tablet Take  1 tablet by mouth daily.     BIOTIN PO Take 5 mg by mouth daily.     Cholecalciferol 50 MCG (2000 UT) CAPS Take 1 capsule by mouth daily after lunch.     famotidine (PEPCID) 20 MG tablet Take 20 mg by mouth at bedtime.     Fish Oil-Krill Oil (KRILL OIL PLUS PO) Take 1 tablet by mouth daily at 6 (six) AM.     fluticasone (FLONASE) 50 MCG/ACT nasal spray Place into both nostrils daily as needed for allergies or rhinitis.     glipiZIDE (GLUCOTROL) 5 MG tablet Take 1 tablet (5 mg total) by mouth 2 (two) times daily before a meal. For diabetes. 180 tablet 3   HYDROcodone-acetaminophen (NORCO/VICODIN) 5-325 MG tablet Take 1 tablet by mouth every 4 (four) hours as needed for moderate pain. 20 tablet 0   Insulin Glargine (BASAGLAR KWIKPEN) 100 UNIT/ML Inject 32 Units into the skin daily. For diabetes. (Patient taking differently: Inject 32 Units into the skin at bedtime. For diabetes.) 30 mL 1   Insulin Pen Needle (B-D ULTRAFINE III SHORT PEN) 31G X 8 MM MISC USE WITH INSULIN NIGHTLY 100 each 1   latanoprost (XALATAN) 0.005 % ophthalmic solution Place 1 drop into both eyes every morning.     levothyroxine (SYNTHROID) 100 MCG tablet Take 1 tablet by mouth every morning on an empty stomach with water only.  No food or other medications for 30 minutes. 90 tablet 1   Magnesium 200 MG TABS Take 1 tablet by mouth at bedtime.     metFORMIN (GLUCOPHAGE-XR) 500 MG 24 hr tablet TAKE 2 TABLETS BY MOUTH TWICE DAILY WITH A MEAL FOR DIABETES 360 tablet 1   rosuvastatin (CRESTOR) 10 MG tablet TAKE 1 TABLET BY MOUTH ONCE DAILY FOR CHOLESTEROL (Patient taking differently: Take 10 mg by mouth at bedtime. TAKE 1 TABLET BY MOUTH ONCE DAILY FOR CHOLESTEROL) 90 tablet 3   VITAMIN A PO Take 2,400 mcg by mouth daily at 6 (six) AM.     No current facility-administered medications for this visit.       Marland Kitchen  PHYSICAL EXAMINATION: ECOG PERFORMANCE STATUS: 0 - Asymptomatic  Vitals:   09/09/22 1049  BP: (!) 157/82  Pulse: 75  Temp: (!) 96.9 F (36.1 C)  SpO2: 96%   Filed Weights   09/09/22 1049  Weight: 212 lb (96.2 kg)    Physical Exam Vitals and nursing note reviewed.  HENT:     Head: Normocephalic and atraumatic.     Mouth/Throat:     Pharynx: Oropharynx is clear.  Eyes:     Extraocular Movements: Extraocular movements intact.     Pupils: Pupils are equal, round, and reactive to light.  Cardiovascular:     Rate and Rhythm: Normal rate and regular rhythm.  Pulmonary:     Comments: Decreased breath sounds bilaterally.  Abdominal:     Palpations: Abdomen is soft.  Musculoskeletal:        General: Normal range of motion.     Cervical back: Normal range of motion.  Skin:    General: Skin is warm.  Neurological:     General: No focal deficit present.     Mental Status: She is alert and oriented to person, place, and time.  Psychiatric:        Behavior: Behavior normal.        Judgment: Judgment normal.      LABORATORY DATA:  I have reviewed the data as listed Lab Results  Component Value Date   WBC 6.6 09/02/2022   HGB 14.3 09/02/2022   HCT 42.0 09/02/2022   MCV 92.7 09/02/2022   PLT 216 09/02/2022   Recent Labs    01/12/22 0942 06/12/22 1441  NA 131* 137  K 4.9 4.1  CL 97 103  CO2 26 24  GLUCOSE 172* 127*  BUN 10 14  CREATININE 0.84 1.14*  CALCIUM 9.9 10.0  GFRNONAA  --  52*  PROT 7.3  --   ALBUMIN 4.5  --   AST 61*  --   ALT 26  --   ALKPHOS 70  --   BILITOT 0.5  --     RADIOGRAPHIC STUDIES: I have personally reviewed the radiological images as listed and agreed with the findings in the report. No results found.  ASSESSMENT & PLAN:   Carcinoma of upper-outer quadrant of right breast in female, estrogen receptor positive ([place]) # Right breast cancer stage I [id number]-INVASIVE MAMMARY CARCINOMA WITH DUCTAL AND LOBULAR FEATURES, 14 MM. PN=0;  G-2. ?  Focal involvement of the margin by DCIS; invasive carcinoma negative margins. Oncotype-RS =8; low risk.  No role for any chemotherapy.   # Left breast DCIS status postlumpectomy-proceed with adjuvant radiation.   #Patient currently undergoing radiation bilateral.  Last radiation today.  Patient will need adjuvant endocrine therapy after finishing adjuvant radiation.  # I again reviewed and discussed the role of endocrine therapy-given ER/PR positive disease.  Patient will be offered antihormone pill 1 a day for 5 years.  Discussed potential downsides including but not limited to arthralgias hot flashes and osteoporosis.  Patient had severe hot flashes after her natural menopause.  Discussed that this will also help prevent developing any cancers in the contralateral breast/DCIS recurrence.  Discussed regarding starting mid October 2023 [1 month postradiation]; prescription for anastrozole sent.   #Osteopenia: BMD March 2023- T-score of -1.9.  On ca+vit D; Discussed the potential risk factors for osteoporosis- age/gender/postmenopausal status/use of anti-estrogen treatments. Discussed multiple options including exercise/ calcium and vitamin D supplementation.   # Obesity-  Discussed importance of healthy weight/and weight loss.  Strongly recommend eating more green leafy vegetables and cutting down processed food/ carbohydrates.  Instead increasing whole grains / protein in the diet.  Multiple studies have shown that optimal weight would help improve cardiovascular risk; also shown to cut on the risk of malignancies-colon cancer, breast cancer ovarian/uterine cancer in women.  Patient has lost weight in the past.  She is motivated.  Recommend evaluation with obesity medicine physician/use of GLP-1 agonists.   # DISPOSITION: # follow up in 2 months  MD; no labs-Dr.B    All questions were answered. The patient/family knows to call the clinic with any problems, questions or concerns.        Cammie Sickle, MD 09/09/2022 12:57 PM

## 2022-09-10 ENCOUNTER — Ambulatory Visit: Payer: Medicare HMO

## 2022-09-11 ENCOUNTER — Ambulatory Visit: Payer: Medicare HMO

## 2022-09-15 DIAGNOSIS — C50411 Malignant neoplasm of upper-outer quadrant of right female breast: Secondary | ICD-10-CM | POA: Diagnosis not present

## 2022-09-16 ENCOUNTER — Ambulatory Visit (INDEPENDENT_AMBULATORY_CARE_PROVIDER_SITE_OTHER): Payer: Medicare HMO | Admitting: Primary Care

## 2022-09-16 ENCOUNTER — Encounter: Payer: Self-pay | Admitting: Primary Care

## 2022-09-16 VITALS — BP 118/76 | HR 70 | Temp 97.1°F | Ht 61.0 in | Wt 212.0 lb

## 2022-09-16 DIAGNOSIS — E1165 Type 2 diabetes mellitus with hyperglycemia: Secondary | ICD-10-CM | POA: Diagnosis not present

## 2022-09-16 DIAGNOSIS — Z23 Encounter for immunization: Secondary | ICD-10-CM

## 2022-09-16 LAB — POCT GLYCOSYLATED HEMOGLOBIN (HGB A1C): Hemoglobin A1C: 9.5 % — AB (ref 4.0–5.6)

## 2022-09-16 NOTE — Assessment & Plan Note (Signed)
Uncontrolled with A1C today of 9.5.  Recommended to increase Glipizide 10 mg BID, she declines. She is motivated to change her diet and to start checking glucose readings.  Continue Basaglar 32 units daily, metformin XR 1000 BID, glipizide 5 mg BID. Stressed that she must check glucose readings, especially given that she is managed on insulin.  Follow up in 3 months. She will update with readings in a few weeks.

## 2022-09-16 NOTE — Progress Notes (Signed)
Subjective:    Patient ID: Valerie Gordon, female    DOB: 05/06/1951, 71 y.o.   MRN: [id number]  HPI  Valerie Gordon is a very pleasant 71 y.o. female with a history of type 2 diabetes, hypothyroidism, hyperlipidemia, breast cancer who presents today for follow up of diabetes.  Current medications include: glipizide 5 mg BID, Basaglar 32 units HS, metformin XR 1000 mg BID.  She has missed doses of her medications a few times over the last few months.   She is checking her blood glucose 0 times daily. She plans on getting back on track with checking glucose readings.   Last A1C: 7.3 in May 2023, 9.5 today Last Eye Exam: UTD Last Foot Exam: Due Pneumonia Vaccination: 2021 Urine Microalbumin: UTD Statin: rosuvastatin   Dietary changes since last visit: Poor diet, increased "comfort food", increased intake of sweets.  She recently started Noom for weight loss and improving her diet.    Exercise: No regular exercise.   BP Readings from Last 3 Encounters:  09/16/22 118/76  09/09/22 (!) 157/82  07/06/22 (!) 150/65      Review of Systems  Respiratory:  Negative for shortness of breath.   Cardiovascular:  Negative for chest pain.  Neurological:  Negative for dizziness, numbness and headaches.         Past Medical History:  Diagnosis Date   Arthritis    lumbar spine   Breast cancer ([place])    Cataract    COVID-19 virus infection 10/14/2021   Depression    Gallstones    asyptomatic   GERD (gastroesophageal reflux disease)    Glaucoma    Headache    h/o migraines   Hyperlipidemia    Hypothyroidism    Obesity    Thyroid disease    Type 2 diabetes mellitus ([place])    manages with diet and exercise    Social History   Socioeconomic History   Marital status: Widowed    Spouse name: Not on file   Number of children: Not on file   Years of education: Not on file   Highest education level: Not on file  Occupational History   Not on file  Tobacco Use   Smoking  status: Never   Smokeless tobacco: Never  Vaping Use   Vaping Use: Never used  Substance and Sexual Activity   Alcohol use: Yes    Comment: occasional wine   Drug use: No   Sexual activity: Yes  Other Topics Concern   Not on file  Social History Narrative   Widower.   Step children.   Environmental health practitioner.   Enjoys riding hot air balloons, spending time with friends, reading, jewelry   Social Determinants of Health   Financial Resource Strain: Low Risk  (12/01/2021)   Overall Financial Resource Strain (CARDIA)    Difficulty of Paying Living Expenses: Not hard at all  Food Insecurity: No Food Insecurity (12/01/2021)   Hunger Vital Sign    Worried About Running Out of Food in the Last Year: Never true    Ran Out of Food in the Last Year: Never true  Transportation Needs: No Transportation Needs (12/01/2021)   PRAPARE - Hydrologist (Medical): No    Lack of Transportation (Non-Medical): No  Physical Activity: Sufficiently Active (12/01/2021)   Exercise Vital Sign    Days of Exercise per Week: 4 days    Minutes of Exercise per Session: 40 min  Stress: No  Stress Concern Present (12/01/2021)   [place]    Feeling of Stress : Not at all  Social Connections: Moderately Isolated (12/01/2021)   Social Connection and Isolation Panel [NHANES]    Frequency of Communication with Friends and Family: More than three times a week    Frequency of Social Gatherings with Friends and Family: Three times a week    Attends Religious Services: Never    Active Member of Clubs or Organizations: Yes    Attends Archivist Meetings: More than 4 times per year    Marital Status: Widowed  Intimate Partner Violence: Not At Risk (12/01/2021)   Humiliation, Afraid, Rape, and Kick questionnaire    Fear of Current or Ex-Partner: No    Emotionally Abused: No    Physically Abused: No    Sexually  Abused: No    Past Surgical History:  Procedure Laterality Date   AXILLARY SENTINEL NODE BIOPSY Right 06/17/2022   Procedure: AXILLARY SENTINEL NODE BIOPSY;  Surgeon: Robert Bellow, MD;  Location: [facility] ORS;  Service: General;  Laterality: Right;   BREAST BIOPSY  12/28/2008   BREAST BIOPSY Right 04/28/2022   [place] core bx 11:00 coil clip -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   BREAST LUMPECTOMY WITH NEEDLE LOCALIZATION Bilateral 06/17/2022   Procedure: BREAST LUMPECTOMY WITH NEEDLE LOCALIZATION;  Surgeon: Robert Bellow, MD;  Location: [facility] ORS;  Service: General;  Laterality: Bilateral;   CARDIAC CATHETERIZATION  12/28/1996   and a glaucoma procedure as well that has a metal shunt in left eye   COLONOSCOPY  10/30/2004   COLONOSCOPY WITH PROPOFOL N/A 04/01/2016   Procedure: COLONOSCOPY WITH PROPOFOL;  Surgeon: Robert Bellow, MD;  Location: [facility] ENDOSCOPY;  Service: Endoscopy;  Laterality: N/A;   EYE SURGERY Bilateral    cataract sx   TONSILLECTOMY     WISDOM TOOTH EXTRACTION  1983    Family History  Problem Relation Age of Onset   Stroke Mother    Heart disease Father    Lung cancer Maternal Grandmother    Stomach cancer Paternal Grandmother    Breast cancer Neg Hx     Allergies  Allergen Reactions   Ciprofloxacin Swelling    At the injection site   Trulicity [Dulaglutide]     Itch at inj site   Antihistamines, Chlorpheniramine-Type Palpitations    Current Outpatient Medications on File Prior to Visit  Medication Sig Dispense Refill   anastrozole (ARIMIDEX) 1 MG tablet Take 1 tablet (1 mg total) by mouth daily. 30 tablet 5   b complex vitamins tablet Take 1 tablet by mouth daily.     BIOTIN PO Take 5 mg by mouth daily.     Cholecalciferol 50 MCG (2000 UT) CAPS Take 1 capsule by mouth daily after lunch.     famotidine  (PEPCID) 20 MG tablet Take 20 mg by mouth at bedtime.     Fish Oil-Krill Oil (KRILL OIL PLUS PO) Take 1 tablet by mouth daily at 6 (six) AM.     fluticasone (FLONASE) 50 MCG/ACT nasal spray Place into both nostrils daily as needed for allergies or rhinitis.     glipiZIDE (GLUCOTROL) 5 MG tablet Take 1 tablet (5 mg total) by mouth 2 (two) times daily before a meal. For diabetes. 180 tablet 3   HYDROcodone-acetaminophen (NORCO/VICODIN) 5-325 MG  tablet Take 1 tablet by mouth every 4 (four) hours as needed for moderate pain. 20 tablet 0   Insulin Glargine (BASAGLAR KWIKPEN) 100 UNIT/ML Inject 32 Units into the skin daily. For diabetes. (Patient taking differently: Inject 32 Units into the skin at bedtime. For diabetes.) 30 mL 1   Insulin Pen Needle (B-D ULTRAFINE III SHORT PEN) 31G X 8 MM MISC USE WITH INSULIN NIGHTLY 100 each 1   latanoprost (XALATAN) 0.005 % ophthalmic solution Place 1 drop into both eyes every morning.     levothyroxine (SYNTHROID) 100 MCG tablet Take 1 tablet by mouth every morning on an empty stomach with water only.  No food or other medications for 30 minutes. 90 tablet 1   Magnesium 200 MG TABS Take 1 tablet by mouth at bedtime.     metFORMIN (GLUCOPHAGE-XR) 500 MG 24 hr tablet TAKE 2 TABLETS BY MOUTH TWICE DAILY WITH A MEAL FOR DIABETES 360 tablet 1   rosuvastatin (CRESTOR) 10 MG tablet TAKE 1 TABLET BY MOUTH ONCE DAILY FOR CHOLESTEROL (Patient taking differently: Take 10 mg by mouth at bedtime. TAKE 1 TABLET BY MOUTH ONCE DAILY FOR CHOLESTEROL) 90 tablet 3   VITAMIN A PO Take 2,400 mcg by mouth daily at 6 (six) AM.     No current facility-administered medications on file prior to visit.    BP 118/76   Pulse 70   Temp (!) 97.1 F (36.2 C) (Temporal)   Ht '5\' 1"'$  (1.549 m)   Wt 212 lb (96.2 kg)   SpO2 98%   BMI 40.06 kg/m  Objective:   Physical Exam Cardiovascular:     Rate and Rhythm: Normal rate and regular rhythm.  Pulmonary:     Effort: Pulmonary effort is  normal.     Breath sounds: Normal breath sounds.  Musculoskeletal:     Cervical back: Neck supple.  Skin:    General: Skin is warm and dry.           Assessment & Plan:   Problem List Items Addressed This Visit       Endocrine   Type 2 diabetes mellitus ([place]) - Primary    Uncontrolled with A1C today of 9.5.  Recommended to increase Glipizide 10 mg BID, she declines. She is motivated to change her diet and to start checking glucose readings.  Continue Basaglar 32 units daily, metformin XR 1000 BID, glipizide 5 mg BID. Stressed that she must check glucose readings, especially given that she is managed on insulin.  Follow up in 3 months. She will update with readings in a few weeks.      Relevant Orders   POCT glycosylated hemoglobin (Hb A1C) (Completed)       Pleas Koch, NP

## 2022-09-16 NOTE — Patient Instructions (Addendum)
Work on [employer] as discussed.  Start walking for exercise.   Please update me in a few weeks regarding your glucose readings.   Please schedule a physical to meet with me for late January 2024.   It was a pleasure to see you today!

## 2022-09-18 ENCOUNTER — Ambulatory Visit (INDEPENDENT_AMBULATORY_CARE_PROVIDER_SITE_OTHER): Payer: Medicare HMO | Admitting: Psychologist

## 2022-09-18 DIAGNOSIS — R69 Illness, unspecified: Secondary | ICD-10-CM | POA: Diagnosis not present

## 2022-09-18 DIAGNOSIS — F330 Major depressive disorder, recurrent, mild: Secondary | ICD-10-CM

## 2022-09-18 NOTE — Progress Notes (Signed)
[facility] Counselor/Therapist Progress Note  Patient ID: Valerie Gordon, MRN: [id number],    Date: 09/18/2022  Time Spent: 02:00 pm to 02:33 pm; total time: 33 minutes   This session was held via video webex teletherapy due to the coronavirus risk at this time. The patient consented to video teletherapy and was located at her home during this session. She is aware it is the responsibility of the patient to secure confidentiality on her end of the session. The provider was in a private home office for the duration of this session. Limits of confidentiality were discussed with the patient.   Treatment Type: Individual Therapy  Reported Symptoms: Denied experiencing symptoms  Mental Status Exam: Appearance:  Casual     Behavior: Appropriate  Motor: Normal  Speech/Language:  Clear and Coherent  Affect: Appropriate  Mood: normal  Thought process: normal  Thought content:   WNL  Sensory/Perceptual disturbances:   WNL  Orientation: oriented to person, place, and time/date  Attention: Good  Concentration: Good  Memory: WNL  Fund of knowledge:  Good  Insight:   Fair  Judgment:  Fair  Impulse Control: Good   Risk Assessment: Danger to Self:  No Self-injurious Behavior: No Danger to Others: No Duty to Warn:no Physical Aggression / Violence:No  Access to Firearms a concern: No  Gang Involvement:No   Subjective: Beginning the session, patient described herself as doing great and indicated that she has completed cancer treatment and is cancer free. Continuing to talk, she talked about how she is making changes in her life. She also talked about how the podcast episode on emotions really helped her. She asked to follow up. She denied suicidal and homicidal ideation.    Interventions:  Worked on developing a therapeutic relationship with the patient using active listening and reflective statements. Provided emotional support using empathy and validation. Reflected on  events since the last session. Used summary and reflective statements. Praised the patient for doing well and explored what has assisted the patient. Used socratic questions to assist the patient. Praised patient for being cancer free. Processed thoughts and emotions. Reviewed thoughts on podcast episode of emotions. Discussed next steps. Discussed another podcast patient can listen to moving forward. Provided empathic statements. Assessed for suicidal and homicidal ideation.   Homework: Listen to podcast episode on making changes from [employer]  Next Session: Review homework and emotional support. Discuss emotions  Diagnosis: F33.0 major depressive affective disorder, recurrent, mild   Plan:    Goals Alleviate depressive symptoms Recognize, accept, and cope with depressive feelings Develop healthy thinking patterns Develop healthy interpersonal relationships  Objectives target date for all objectives is 01/21/2023 Cooperate with a medication evaluation by a physician Verbalize an accurate understanding of depression Verbalize an understanding of the treatment Identify and replace thoughts that support depression Learn and implement behavioral strategies Verbalize an understanding and resolution of current interpersonal problems Learn and implement problem solving and decision making skills Learn and implement conflict resolution skills to resolve interpersonal problems Verbalize an understanding of healthy and unhealthy emotions verbalize insight into how past relationships may be influence current experiences with depression Use mindfulness and acceptance strategies and increase value based behavior  Increase hopeful statements about the future.  Interventions Consistent with treatment model, discuss how change in cognitive, behavioral, and interpersonal can help client alleviate depression CBT Behavioral activation help the client explore the relationship, nature of the  dispute,  Help the client develop new interpersonal skills and relationships Conduct Problem so  living therapy Teach conflict resolution skills Use a process-experiential approach Conduct TLDP Conduct ACT Evaluate need for psychotropic medication Monitor adherence to medication   The patient and clinician reviewed the treatment plan on 02/16/2022. The patient approved of the treatment plan.   Conception Chancy, PsyD

## 2022-09-19 DIAGNOSIS — E1165 Type 2 diabetes mellitus with hyperglycemia: Secondary | ICD-10-CM

## 2022-09-21 MED ORDER — DEXCOM G7 SENSOR MISC: 3 refills | Status: DC

## 2022-10-19 ENCOUNTER — Telehealth: Payer: Medicare HMO | Admitting: Physician Assistant

## 2022-10-19 DIAGNOSIS — J019 Acute sinusitis, unspecified: Secondary | ICD-10-CM

## 2022-10-19 DIAGNOSIS — B9689 Other specified bacterial agents as the cause of diseases classified elsewhere: Secondary | ICD-10-CM | POA: Diagnosis not present

## 2022-10-19 MED ORDER — BENZONATATE 100 MG PO CAPS: 100.0000 mg | ORAL_CAPSULE | Freq: Three times a day (TID) | ORAL | 0 refills | Status: DC | PRN

## 2022-10-19 MED ORDER — DOXYCYCLINE HYCLATE 100 MG PO TABS: 100.0000 mg | ORAL_TABLET | Freq: Two times a day (BID) | ORAL | 0 refills | Status: DC

## 2022-10-19 NOTE — Progress Notes (Signed)
Virtual Visit Consent   Valerie Gordon, you are scheduled for a virtual visit with a [facility] provider today. Just as with appointments in the office, your consent must be obtained to participate. Your consent will be active for this visit and any virtual visit you may have with one of our providers in the next 365 days. If you have a MyChart account, a copy of this consent can be sent to you electronically.  As this is a virtual visit, video technology does not allow for your provider to perform a traditional examination. This may limit your provider's ability to fully assess your condition. If your provider identifies any concerns that need to be evaluated in person or the need to arrange testing (such as labs, EKG, etc.), we will make arrangements to do so. Although advances in technology are sophisticated, we cannot ensure that it will always work on either your end or our end. If the connection with a video visit is poor, the visit may have to be switched to a telephone visit. With either a video or telephone visit, we are not always able to ensure that we have a secure connection.  By engaging in this virtual visit, you consent to the provision of healthcare and authorize for your insurance to be billed (if applicable) for the services provided during this visit. Depending on your insurance coverage, you may receive a charge related to this service.  I need to obtain your verbal consent now. Are you willing to proceed with your visit today? Valerie Gordon has provided verbal consent on 10/19/2022 for a virtual visit (video or telephone). Leeanne Gordon, [place]  Date: 10/19/2022 12:04 PM  Virtual Visit via Video Note   I, Leeanne Gordon, connected with  LEISHA Gordon  ([id number], [birth year]) on 10/19/22 at 12:00 PM EDT by a video-enabled telemedicine application and verified that I am speaking with the correct person using two identifiers.  Location: Patient: Virtual Visit Location  Patient: Home Provider: Virtual Visit Location Provider: Home Office   I discussed the limitations of evaluation and management by telemedicine and the availability of in person appointments. The patient expressed understanding and agreed to proceed.    History of Present Illness: Valerie Gordon is a 71 y.o. who identifies as a female who was assigned female at birth, and is being seen today for possible sinusitis. Endorses about 2 weeks of nasal and head congestion, sinus pressure and pain. Now with more cough and chest congestion. Denies fever, chills, chest pain. Taking OTC sinus medications which are only helping somewhat with symptoms but symptoms continue to progress.   HPI: HPI  Problems:  Patient Active Problem List   Diagnosis Date Noted   Carcinoma of upper-outer quadrant of right breast in female, estrogen receptor positive ([place]) 05/06/2022   Elevated blood pressure reading without diagnosis of hypertension 02/11/2022   Acute sinusitis 01/16/2022   Diarrhea 05/14/2021   Rash and nonspecific skin eruption 10/30/2019   Encounter for annual general medical examination with abnormal findings in adult 03/30/2016   Vitamin D deficiency 03/30/2016   Type 2 diabetes mellitus ([place]) 03/16/2016   GERD (gastroesophageal reflux disease) 01/16/2015   Depression 01/16/2015   Hyperlipidemia 01/16/2015   Skin lesion of left arm 01/16/2015   Obesity (BMI 30-39.9) 05/31/2012   Hypothyroidism 05/31/2012    Allergies:  Allergies  Allergen Reactions   Ciprofloxacin Swelling    At the injection site   Trulicity [Dulaglutide]     Itch  at inj site   Antihistamines, Chlorpheniramine-Type Palpitations   Medications:  Current Outpatient Medications:    benzonatate (TESSALON) 100 MG capsule, Take 1 capsule (100 mg total) by mouth 3 (three) times daily as needed for cough., Disp: 30 capsule, Rfl: 0   doxycycline (VIBRA-TABS) 100 MG tablet, Take 1 tablet (100 mg total) by mouth 2 (two) times  daily., Disp: 20 tablet, Rfl: 0   anastrozole (ARIMIDEX) 1 MG tablet, Take 1 tablet (1 mg total) by mouth daily., Disp: 30 tablet, Rfl: 5   b complex vitamins tablet, Take 1 tablet by mouth daily., Disp: , Rfl:    BIOTIN PO, Take 5 mg by mouth daily., Disp: , Rfl:    Cholecalciferol 50 MCG (2000 UT) CAPS, Take 1 capsule by mouth daily after lunch., Disp: , Rfl:    Continuous Blood Gluc Sensor (DEXCOM G7 SENSOR) MISC, Apply every 10 days to check blood sugars., Disp: 3 each, Rfl: 3   famotidine (PEPCID) 20 MG tablet, Take 20 mg by mouth at bedtime., Disp: , Rfl:    Fish Oil-Krill Oil (KRILL OIL PLUS PO), Take 1 tablet by mouth daily at 6 (six) AM., Disp: , Rfl:    fluticasone (FLONASE) 50 MCG/ACT nasal spray, Place into both nostrils daily as needed for allergies or rhinitis., Disp: , Rfl:    glipiZIDE (GLUCOTROL) 5 MG tablet, Take 1 tablet (5 mg total) by mouth 2 (two) times daily before a meal. For diabetes., Disp: 180 tablet, Rfl: 3   HYDROcodone-acetaminophen (NORCO/VICODIN) 5-325 MG tablet, Take 1 tablet by mouth every 4 (four) hours as needed for moderate pain., Disp: 20 tablet, Rfl: 0   Insulin Glargine (BASAGLAR KWIKPEN) 100 UNIT/ML, Inject 32 Units into the skin daily. For diabetes. (Patient taking differently: Inject 32 Units into the skin at bedtime. For diabetes.), Disp: 30 mL, Rfl: 1   Insulin Pen Needle (B-D ULTRAFINE III SHORT PEN) 31G X 8 MM MISC, USE WITH INSULIN NIGHTLY, Disp: 100 each, Rfl: 1   latanoprost (XALATAN) 0.005 % ophthalmic solution, Place 1 drop into both eyes every morning., Disp: , Rfl:    levothyroxine (SYNTHROID) 100 MCG tablet, Take 1 tablet by mouth every morning on an empty stomach with water only.  No food or other medications for 30 minutes., Disp: 90 tablet, Rfl: 1   Magnesium 200 MG TABS, Take 1 tablet by mouth at bedtime., Disp: , Rfl:    metFORMIN (GLUCOPHAGE-XR) 500 MG 24 hr tablet, TAKE 2 TABLETS BY MOUTH TWICE DAILY WITH A MEAL FOR DIABETES, Disp: 360  tablet, Rfl: 1   rosuvastatin (CRESTOR) 10 MG tablet, TAKE 1 TABLET BY MOUTH ONCE DAILY FOR CHOLESTEROL (Patient taking differently: Take 10 mg by mouth at bedtime. TAKE 1 TABLET BY MOUTH ONCE DAILY FOR CHOLESTEROL), Disp: 90 tablet, Rfl: 3   VITAMIN A PO, Take 2,400 mcg by mouth daily at 6 (six) AM., Disp: , Rfl:   Observations/Objective: Patient is well-developed, well-nourished in no acute distress.  Resting comfortably at home.  Head is normocephalic, atraumatic.  No labored breathing. Speech is clear and coherent with logical content.  Patient is alert and oriented at baseline.   Assessment and Plan: 1. Acute bacterial sinusitis - doxycycline (VIBRA-TABS) 100 MG tablet; Take 1 tablet (100 mg total) by mouth 2 (two) times daily.  Dispense: 20 tablet; Refill: 0 - benzonatate (TESSALON) 100 MG capsule; Take 1 capsule (100 mg total) by mouth 3 (three) times daily as needed for cough.  Dispense: 30 capsule; Refill: 0  Rx Doxycycline.  Increase fluids.  Rest.  Saline nasal spray.  Probiotic.  Mucinex as directed.  Humidifier in bedroom. Tessalon per orders.  Call or return to clinic if symptoms are not improving.   Follow Up Instructions: I discussed the assessment and treatment plan with the patient. The patient was provided an opportunity to ask questions and all were answered. The patient agreed with the plan and demonstrated an understanding of the instructions.  A copy of instructions were sent to the patient via MyChart unless otherwise noted below.   The patient was advised to call back or seek an in-person evaluation if the symptoms worsen or if the condition fails to improve as anticipated.  Time:  I spent 15 minutes with the patient via telehealth technology discussing the above problems/concerns.    Leeanne Rio, PA-C

## 2022-10-19 NOTE — Patient Instructions (Signed)
Valerie Gordon, thank you for joining Leeanne Rio, PA-C for today's virtual visit.  While this provider is not your primary care provider (PCP), if your PCP is located in our provider database this encounter information will be shared with them immediately following your visit.   [facility] account gives you access to today's visit and all your visits, tests, and labs performed at [facility] " click here if you don't have a [facility] account or go to [email or web address]  Consent: (Patient) Valerie Gordon provided verbal consent for this virtual visit at the beginning of the encounter.  Current Medications:  Current Outpatient Medications:    anastrozole (ARIMIDEX) 1 MG tablet, Take 1 tablet (1 mg total) by mouth daily., Disp: 30 tablet, Rfl: 5   b complex vitamins tablet, Take 1 tablet by mouth daily., Disp: , Rfl:    BIOTIN PO, Take 5 mg by mouth daily., Disp: , Rfl:    Cholecalciferol 50 MCG (2000 UT) CAPS, Take 1 capsule by mouth daily after lunch., Disp: , Rfl:    Continuous Blood Gluc Sensor (DEXCOM G7 SENSOR) MISC, Apply every 10 days to check blood sugars., Disp: 3 each, Rfl: 3   famotidine (PEPCID) 20 MG tablet, Take 20 mg by mouth at bedtime., Disp: , Rfl:    Fish Oil-Krill Oil (KRILL OIL PLUS PO), Take 1 tablet by mouth daily at 6 (six) AM., Disp: , Rfl:    fluticasone (FLONASE) 50 MCG/ACT nasal spray, Place into both nostrils daily as needed for allergies or rhinitis., Disp: , Rfl:    glipiZIDE (GLUCOTROL) 5 MG tablet, Take 1 tablet (5 mg total) by mouth 2 (two) times daily before a meal. For diabetes., Disp: 180 tablet, Rfl: 3   HYDROcodone-acetaminophen (NORCO/VICODIN) 5-325 MG tablet, Take 1 tablet by mouth every 4 (four) hours as needed for moderate pain., Disp: 20 tablet, Rfl: 0   Insulin Glargine (BASAGLAR KWIKPEN) 100 UNIT/ML, Inject 32 Units into the skin daily. For diabetes. (Patient taking differently: Inject 32 Units into the  skin at bedtime. For diabetes.), Disp: 30 mL, Rfl: 1   Insulin Pen Needle (B-D ULTRAFINE III SHORT PEN) 31G X 8 MM MISC, USE WITH INSULIN NIGHTLY, Disp: 100 each, Rfl: 1   latanoprost (XALATAN) 0.005 % ophthalmic solution, Place 1 drop into both eyes every morning., Disp: , Rfl:    levothyroxine (SYNTHROID) 100 MCG tablet, Take 1 tablet by mouth every morning on an empty stomach with water only.  No food or other medications for 30 minutes., Disp: 90 tablet, Rfl: 1   Magnesium 200 MG TABS, Take 1 tablet by mouth at bedtime., Disp: , Rfl:    metFORMIN (GLUCOPHAGE-XR) 500 MG 24 hr tablet, TAKE 2 TABLETS BY MOUTH TWICE DAILY WITH A MEAL FOR DIABETES, Disp: 360 tablet, Rfl: 1   rosuvastatin (CRESTOR) 10 MG tablet, TAKE 1 TABLET BY MOUTH ONCE DAILY FOR CHOLESTEROL (Patient taking differently: Take 10 mg by mouth at bedtime. TAKE 1 TABLET BY MOUTH ONCE DAILY FOR CHOLESTEROL), Disp: 90 tablet, Rfl: 3   VITAMIN A PO, Take 2,400 mcg by mouth daily at 6 (six) AM., Disp: , Rfl:    Medications ordered in this encounter:  No orders of the defined types were placed in this encounter.    *If you need refills on other medications prior to your next appointment, please contact your pharmacy*  Follow-Up: Call back or seek an in-person evaluation if the symptoms worsen or if the condition fails  to improve as anticipated.  [place] [phone]  Other Instructions Please take antibiotic as directed.  Increase fluid intake.  Use Saline nasal spray.  Take a daily multivitamin. UIse Coricidin HBP OTC. Start your Flonase again.  Place a humidifier in the bedroom.  Please call or return clinic if symptoms are not improving.  Sinusitis Sinusitis is redness, soreness, and swelling (inflammation) of the paranasal sinuses. Paranasal sinuses are air pockets within the bones of your face (beneath the eyes, the middle of the forehead, or above the eyes). In healthy paranasal sinuses, mucus is able to drain  out, and air is able to circulate through them by way of your nose. However, when your paranasal sinuses are inflamed, mucus and air can become trapped. This can allow bacteria and other germs to grow and cause infection. Sinusitis can develop quickly and last only a short time (acute) or continue over a long period (chronic). Sinusitis that lasts for more than 12 weeks is considered chronic.  CAUSES  Causes of sinusitis include: Allergies. Structural abnormalities, such as displacement of the cartilage that separates your nostrils (deviated septum), which can decrease the air flow through your nose and sinuses and affect sinus drainage. Functional abnormalities, such as when the small hairs (cilia) that line your sinuses and help remove mucus do not work properly or are not present. SYMPTOMS  Symptoms of acute and chronic sinusitis are the same. The primary symptoms are pain and pressure around the affected sinuses. Other symptoms include: Upper toothache. Earache. Headache. Bad breath. Decreased sense of smell and taste. A cough, which worsens when you are lying flat. Fatigue. Fever. Thick drainage from your nose, which often is green and may contain pus (purulent). Swelling and warmth over the affected sinuses. DIAGNOSIS  Your caregiver will perform a physical exam. During the exam, your caregiver may: Look in your nose for signs of abnormal growths in your nostrils (nasal polyps). Tap over the affected sinus to check for signs of infection. View the inside of your sinuses (endoscopy) with a special imaging device with a light attached (endoscope), which is inserted into your sinuses. If your caregiver suspects that you have chronic sinusitis, one or more of the following tests may be recommended: Allergy tests. Nasal culture A sample of mucus is taken from your nose and sent to a lab and screened for bacteria. Nasal cytology A sample of mucus is taken from your nose and examined by  your caregiver to determine if your sinusitis is related to an allergy. TREATMENT  Most cases of acute sinusitis are related to a viral infection and will resolve on their own within 10 days. Sometimes medicines are prescribed to help relieve symptoms (pain medicine, decongestants, nasal steroid sprays, or saline sprays).  However, for sinusitis related to a bacterial infection, your caregiver will prescribe antibiotic medicines. These are medicines that will help kill the bacteria causing the infection.  Rarely, sinusitis is caused by a fungal infection. In theses cases, your caregiver will prescribe antifungal medicine. For some cases of chronic sinusitis, surgery is needed. Generally, these are cases in which sinusitis recurs more than 3 times per year, despite other treatments. HOME CARE INSTRUCTIONS  Drink plenty of water. Water helps thin the mucus so your sinuses can drain more easily. Use a humidifier. Inhale steam 3 to 4 times a day (for example, sit in the bathroom with the shower running). Apply a warm, moist washcloth to your face 3 to 4 times a day,  or as directed by your caregiver. Use saline nasal sprays to help moisten and clean your sinuses. Take over-the-counter or prescription medicines for pain, discomfort, or fever only as directed by your caregiver. SEEK IMMEDIATE MEDICAL CARE IF: You have increasing pain or severe headaches. You have nausea, vomiting, or drowsiness. You have swelling around your face. You have vision problems. You have a stiff neck. You have difficulty breathing. MAKE SURE YOU:  Understand these instructions. Will watch your condition. Will get help right away if you are not doing well or get worse. Document Released: 12/14/2005 Document Revised: 03/07/2012 Document Reviewed: 12/29/2011 [facility] Patient Information 2014 [place], [place].    If you have been instructed to have an in-person evaluation today at a local Urgent Care facility, please use  the link below. It will take you to a list of all of our available [facility], including address, phone number and hours of operation. Please do not delay care.  [facility]  If you or a family member do not have a primary care provider, use the link below to schedule a visit and establish care. When you choose a [facility] primary care physician or advanced practice provider, you gain a long-term partner in health. Find a Primary Care Provider  Learn more about [facility]'s in-office and virtual care options: [facility]

## 2022-10-21 ENCOUNTER — Ambulatory Visit: Payer: Medicare HMO | Admitting: Radiation Oncology

## 2022-10-28 ENCOUNTER — Encounter: Payer: Self-pay | Admitting: Radiation Oncology

## 2022-10-28 ENCOUNTER — Ambulatory Visit
Admission: RE | Admit: 2022-10-28 | Discharge: 2022-10-28 | Disposition: A | Discharge status: Home / Self Care | Payer: Medicare HMO | Source: Ambulatory Visit | Attending: Radiation Oncology | Admitting: Radiation Oncology

## 2022-10-28 VITALS — BP 138/76 | HR 85 | Temp 97.6°F | Resp 16 | Ht 61.0 in | Wt 208.0 lb

## 2022-10-28 DIAGNOSIS — Z170 Estrogen receptor positive status [ER+]: Secondary | ICD-10-CM | POA: Insufficient documentation

## 2022-10-28 DIAGNOSIS — C50411 Malignant neoplasm of upper-outer quadrant of right female breast: Secondary | ICD-10-CM | POA: Insufficient documentation

## 2022-10-28 NOTE — Progress Notes (Signed)
Radiation Oncology Follow up Note  Name: Valerie Gordon   Date:   10/28/2022 MRN:  [id number] DOB: Jun 13, 1951    This 71 y.o. 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  REFERRING PROVIDER: Pleas Koch, NP  HPI: Patient is a 71 year old female with above-stated bilateral breast cancers now out 1 month.  She is doing well still has some minor itching mostly in the inframammary folds around the nipple.  She otherwise is without complaint.  Specifically denies breast tenderness cough or bone pain..  She has been started on Arimidex tolerating it well without side effect.  COMPLICATIONS OF TREATMENT: none  FOLLOW UP COMPLIANCE: keeps appointments   PHYSICAL EXAM:  BP 138/76 (BP Location: Left Arm, Patient Position: Sitting, Cuff Size: Normal)   Pulse 85   Temp 97.6 F (36.4 C) (Tympanic)   Resp 16   Ht '5\' 1"'$  (1.549 m) Comment: Stated Ht  Wt 208 lb (94.3 kg)   BMI 39.30 kg/m  Lungs are clear to A&P cardiac examination essentially unremarkable with regular rate and rhythm. No dominant mass or nodularity is noted in either breast in 2 positions examined. Incision is well-healed. No axillary or supraclavicular adenopathy is appreciated. Cosmetic result is excellent.  Well-developed well-nourished patient in NAD. HEENT reveals PERLA, EOMI, discs not visualized.  Oral cavity is clear. No oral mucosal lesions are identified. Neck is clear without evidence of cervical or supraclavicular adenopathy. Lungs are clear to A&P. Cardiac examination is essentially unremarkable with regular rate and rhythm without murmur rub or thrill. Abdomen is benign with no organomegaly or masses noted. Motor sensory and DTR levels are equal and symmetric in the upper and lower extremities. Cranial nerves II through XII are grossly intact. Proprioception is  intact. No peripheral adenopathy or edema is identified. No motor or sensory levels are noted. Crude visual fields are within normal range.  RADIOLOGY RESULTS: No current films to review  PLAN: Time she is now 1 month out from bilateral breast radiation she is doing well.  Very little skin changes at this point time except for some mild erythema in the inframammary fold.  I have asked to see her back in approximately 6 months for follow-up.  She continues on Arimidex without side effect.  Patient is to call with any concerns.  I would like to take this opportunity to thank you for allowing me to participate in the care of your patient.Noreene Filbert, MD

## 2022-11-02 ENCOUNTER — Ambulatory Visit: Payer: Medicare HMO | Admitting: Psychologist

## 2022-11-02 DIAGNOSIS — D225 Melanocytic nevi of trunk: Secondary | ICD-10-CM | POA: Diagnosis not present

## 2022-11-02 DIAGNOSIS — D485 Neoplasm of uncertain behavior of skin: Secondary | ICD-10-CM | POA: Diagnosis not present

## 2022-11-02 DIAGNOSIS — L578 Other skin changes due to chronic exposure to nonionizing radiation: Secondary | ICD-10-CM | POA: Diagnosis not present

## 2022-11-02 DIAGNOSIS — Z872 Personal history of diseases of the skin and subcutaneous tissue: Secondary | ICD-10-CM | POA: Diagnosis not present

## 2022-11-02 DIAGNOSIS — C44519 Basal cell carcinoma of skin of other part of trunk: Secondary | ICD-10-CM | POA: Diagnosis not present

## 2022-11-02 DIAGNOSIS — L570 Actinic keratosis: Secondary | ICD-10-CM | POA: Diagnosis not present

## 2022-11-02 DIAGNOSIS — Z85828 Personal history of other malignant neoplasm of skin: Secondary | ICD-10-CM | POA: Diagnosis not present

## 2022-11-09 ENCOUNTER — Inpatient Hospital Stay: Payer: Medicare HMO | Admitting: Internal Medicine

## 2022-11-27 DIAGNOSIS — L988 Other specified disorders of the skin and subcutaneous tissue: Secondary | ICD-10-CM | POA: Diagnosis not present

## 2022-11-27 DIAGNOSIS — C4491 Basal cell carcinoma of skin, unspecified: Secondary | ICD-10-CM | POA: Diagnosis not present

## 2022-11-28 ENCOUNTER — Other Ambulatory Visit: Payer: Self-pay | Admitting: Primary Care

## 2022-11-28 DIAGNOSIS — E119 Type 2 diabetes mellitus without complications: Secondary | ICD-10-CM

## 2022-11-30 ENCOUNTER — Telehealth: Payer: Self-pay | Admitting: Primary Care

## 2022-11-30 NOTE — Telephone Encounter (Signed)
Spoke with patient verified she has reached out to [facility] to get her medication refilled. She understands the importance of taking her medication daily as prescribed.

## 2022-11-30 NOTE — Telephone Encounter (Signed)
Maggie from [facility] called and stated the patient hasn't got a refill on medication glipiZIDE (GLUCOTROL) 5 MG tablet and it has been until July 24 and she wants [facility] to refill all at one time. Call back number [phone].

## 2022-12-01 DIAGNOSIS — D0512 Intraductal carcinoma in situ of left breast: Secondary | ICD-10-CM | POA: Diagnosis not present

## 2022-12-01 DIAGNOSIS — Z170 Estrogen receptor positive status [ER+]: Secondary | ICD-10-CM | POA: Diagnosis not present

## 2022-12-01 DIAGNOSIS — C50411 Malignant neoplasm of upper-outer quadrant of right female breast: Secondary | ICD-10-CM | POA: Diagnosis not present

## 2022-12-02 ENCOUNTER — Ambulatory Visit (INDEPENDENT_AMBULATORY_CARE_PROVIDER_SITE_OTHER): Payer: Medicare HMO

## 2022-12-02 VITALS — Wt 208.0 lb

## 2022-12-02 DIAGNOSIS — Z0000 Encounter for general adult medical examination without abnormal findings: Secondary | ICD-10-CM

## 2022-12-02 NOTE — Progress Notes (Signed)
Virtual Visit via Telephone Note  I connected with  Valerie Gordon on 12/02/22 at  3:00 PM EST by telephone and verified that I am speaking with the correct person using two identifiers.  Location: Patient: home Provider: [place] Persons participating in the virtual visit: [name]   I discussed the limitations, risks, security and privacy concerns of performing an evaluation and management service by telephone and the availability of in person appointments. The patient expressed understanding and agreed to proceed.  Interactive audio and video telecommunications were attempted between this nurse and patient, however failed, due to patient having technical difficulties OR patient did not have access to video capability.  We continued and completed visit with audio only.  Some vital signs may be absent or patient reported.   Valerie David, LPN  Subjective:   Valerie Gordon is a 71 y.o. female who presents for Medicare Annual (Subsequent) preventive examination.  Review of Systems     Cardiac Risk Factors include: advanced age (>78mn, >>79women);diabetes mellitus;dyslipidemia     Objective:    There were no vitals filed for this visit. There is no height or weight on file to calculate BMI.     12/02/2022    3:14 PM 09/09/2022   10:49 AM 07/06/2022   10:13 AM 06/17/2022    9:37 AM 06/11/2022   12:11 PM 06/05/2022    1:09 PM 05/06/2022    1:59 PM  Advanced Directives  Does Patient Have a Medical Advance Directive? No Yes No No Yes Yes Yes  Type of Advance Directive  Living will  Living will  Living will Living will  Does patient want to make changes to medical advance directive?   No - Patient declined No - Patient declined     Would patient like information on creating a medical advance directive? No - Patient declined  No - Patient declined No - Patient declined       Current Medications (verified) Outpatient Encounter Medications as of 12/02/2022   Medication Sig   anastrozole (ARIMIDEX) 1 MG tablet Take 1 tablet (1 mg total) by mouth daily.   b complex vitamins tablet Take 1 tablet by mouth daily.   BIOTIN PO Take 5 mg by mouth daily.   Cholecalciferol 50 MCG (2000 UT) CAPS Take 1 capsule by mouth daily after lunch.   Continuous Blood Gluc Sensor (DEXCOM G7 SENSOR) MISC Apply every 10 days to check blood sugars.   famotidine (PEPCID) 20 MG tablet Take 20 mg by mouth at bedtime.   Fish Oil-Krill Oil (KRILL OIL PLUS PO) Take 1 tablet by mouth daily at 6 (six) AM.   fluticasone (FLONASE) 50 MCG/ACT nasal spray Place into both nostrils daily as needed for allergies or rhinitis.   glipiZIDE (GLUCOTROL) 5 MG tablet Take 1 tablet (5 mg total) by mouth 2 (two) times daily before a meal. For diabetes.   Insulin Glargine (BASAGLAR KWIKPEN) 100 UNIT/ML INJECT 32 UNITS SUBCUTANEOUSLY ONCE DAILY FOR DIABETES   Insulin Pen Needle (B-D ULTRAFINE III SHORT PEN) 31G X 8 MM MISC USE WITH INSULIN NIGHTLY   latanoprost (XALATAN) 0.005 % ophthalmic solution Place 1 drop into both eyes every morning.   levothyroxine (SYNTHROID) 100 MCG tablet Take 1 tablet by mouth every morning on an empty stomach with water only.  No food or other medications for 30 minutes.   Magnesium 200 MG TABS Take 1 tablet by mouth at bedtime.   metFORMIN (GLUCOPHAGE-XR) 500 MG 24  hr tablet TAKE 2 TABLETS BY MOUTH TWICE DAILY WITH A MEAL FOR DIABETES   rosuvastatin (CRESTOR) 10 MG tablet TAKE 1 TABLET BY MOUTH ONCE DAILY FOR CHOLESTEROL (Patient taking differently: Take 10 mg by mouth at bedtime. TAKE 1 TABLET BY MOUTH ONCE DAILY FOR CHOLESTEROL)   VITAMIN A PO Take 2,400 mcg by mouth daily at 6 (six) AM.   [DISCONTINUED] benzonatate (TESSALON) 100 MG capsule Take 1 capsule (100 mg total) by mouth 3 (three) times daily as needed for cough. (Patient not taking: Reported on 12/02/2022)   No facility-administered encounter medications on file as of 12/02/2022.    Allergies  (verified) Ciprofloxacin; Trulicity [dulaglutide]; and Antihistamines, chlorpheniramine-type   History: Past Medical History:  Diagnosis Date   Arthritis    lumbar spine   Breast cancer ([place])    Cataract    COVID-19 virus infection 10/14/2021   Depression    Gallstones    asyptomatic   GERD (gastroesophageal reflux disease)    Glaucoma    Headache    h/o migraines   Hyperlipidemia    Hypothyroidism    Obesity    Thyroid disease    Type 2 diabetes mellitus ([place])    manages with diet and exercise   Past Surgical History:  Procedure Laterality Date   AXILLARY SENTINEL NODE BIOPSY Right 06/17/2022   Procedure: AXILLARY SENTINEL NODE BIOPSY;  Surgeon: Robert Bellow, MD;  Location: [facility] ORS;  Service: General;  Laterality: Right;   BREAST BIOPSY  12/28/2008   BREAST BIOPSY Right 04/28/2022   [place] core bx 11:00 coil clip -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   BREAST LUMPECTOMY WITH NEEDLE LOCALIZATION Bilateral 06/17/2022   Procedure: BREAST LUMPECTOMY WITH NEEDLE LOCALIZATION;  Surgeon: Robert Bellow, MD;  Location: [facility] ORS;  Service: General;  Laterality: Bilateral;   CARDIAC CATHETERIZATION  12/28/1996   and a glaucoma procedure as well that has a metal shunt in left eye   COLONOSCOPY  10/30/2004   COLONOSCOPY WITH PROPOFOL N/A 04/01/2016   Procedure: COLONOSCOPY WITH PROPOFOL;  Surgeon: Robert Bellow, MD;  Location: [facility] ENDOSCOPY;  Service: Endoscopy;  Laterality: N/A;   EYE SURGERY Bilateral    cataract sx   TONSILLECTOMY     WISDOM TOOTH EXTRACTION  1983   Family History  Problem Relation Age of Onset   Stroke Mother    Heart disease Father    Lung cancer Maternal Grandmother    Stomach cancer Paternal Grandmother    Breast cancer Neg Hx    Social History   Socioeconomic History   Marital status: Widowed     Spouse name: Not on file   Number of children: Not on file   Years of education: Not on file   Highest education level: Not on file  Occupational History   Not on file  Tobacco Use   Smoking status: Never   Smokeless tobacco: Never  Vaping Use   Vaping Use: Never used  Substance and Sexual Activity   Alcohol use: Yes    Comment: occasional wine   Drug use: No   Sexual activity: Yes  Other Topics Concern   Not on file  Social History Narrative   Widower.   Step children.   Environmental health practitioner.   Enjoys riding hot air balloons, spending time with friends, reading, jewelry   Social Determinants of Health  Financial Resource Strain: Low Risk  (12/02/2022)   Overall Financial Resource Strain (CARDIA)    Difficulty of Paying Living Expenses: Not hard at all  Food Insecurity: No Food Insecurity (12/02/2022)   Hunger Vital Sign    Worried About Running Out of Food in the Last Year: Never true    Ran Out of Food in the Last Year: Never true  Transportation Needs: No Transportation Needs (12/02/2022)   PRAPARE - Hydrologist (Medical): No    Lack of Transportation (Non-Medical): No  Physical Activity: Insufficiently Active (12/02/2022)   Exercise Vital Sign    Days of Exercise per Week: 5 days    Minutes of Exercise per Session: 20 min  Stress: No Stress Concern Present (12/02/2022)   [place]    Feeling of Stress : Not at all  Social Connections: Socially Isolated (12/02/2022)   Social Connection and Isolation Panel [NHANES]    Frequency of Communication with Friends and Family: More than three times a week    Frequency of Social Gatherings with Friends and Family: Once a week    Attends Religious Services: Never    Marine scientist or Organizations: No    Attends Archivist Meetings: Never    Marital Status: Widowed    Tobacco Counseling Counseling given:  Not Answered   Clinical Intake:  Pre-visit preparation completed: Yes  Pain : No/denies pain     Diabetes: Yes CBG done?: No Did pt. bring in CBG monitor from home?: No  How often do you need to have someone help you when you read instructions, pamphlets, or other written materials from your doctor or pharmacy?: 1 - Never  Diabetic?yes Nutrition Risk Assessment:  Has the patient had any N/V/D within the last 2 months?  No  Does the patient have any non-healing wounds?  No  Has the patient had any unintentional weight loss or weight gain?  No   Diabetes:  Is the patient diabetic?  Yes  If diabetic, was a CBG obtained today?  No  Did the patient bring in their glucometer from home?  No  How often do you monitor your CBG's? daily.   Financial Strains and Diabetes Management:  Are you having any financial strains with the device, your supplies or your medication? No .  Does the patient want to be seen by Chronic Care Management for management of their diabetes?  No  Would the patient like to be referred to a Nutritionist or for Diabetic Management?  No   Diabetic Exams:  Diabetic Eye Exam: Completed 11/17/21. Overdue for diabetic eye exam. Pt has been advised about the importance in completing this exam.   Diabetic Foot Exam: Completed 08/27/21. Pt has been advised about the importance in completing this exam.    Interpreter Needed?: No  Information entered by :: Kirke Shaggy, LPN   Activities of Daily Living    12/02/2022    3:14 PM 06/11/2022   11:49 AM  In your present state of health, do you have any difficulty performing the following activities:  Hearing? 0   Vision? 0   Difficulty concentrating or making decisions? 0   Walking or climbing stairs? 0   Dressing or bathing? 0   Doing errands, shopping? 0 0  Preparing Food and eating ? N   Using the Toilet? N   In the past six months, have you accidently leaked urine? N  Do you have problems with loss of  bowel control? N   Managing your Medications? N   Managing your Finances? N   Housekeeping or managing your Housekeeping? N     Patient Care Team: Pleas Koch, NP as PCP - General (Internal Medicine) Ammie Dalton, Okey Regal, MD as Consulting Physician (Obstetrics and Gynecology) Bary Castilla Forest Gleason, MD (General Surgery) Kennith Center, RD as Dietitian (Family Medicine) Theodore Demark, RN (Inactive) as Oncology Nurse Navigator Daiva Huge, RN as Oncology Nurse Navigator Cammie Sickle, MD as Consulting Physician (Oncology)  Indicate any recent Medical Services you may have received from other than [facility] providers in the past year (date may be approximate).     Assessment:   This is a routine wellness examination for Valerie Gordon.  Hearing/Vision screen Hearing Screening - Comments:: No aids Vision Screening - Comments:: Wears glasses- [facility]  Dietary issues and exercise activities discussed: Current Exercise Habits: Home exercise routine, Time (Minutes): 20, Frequency (Times/Week): 5, Weekly Exercise (Minutes/Week): 100, Intensity: Mild   Goals Addressed             This Visit's Progress    DIET - EAT MORE FRUITS AND VEGETABLES         Depression Screen    12/02/2022    3:12 PM 12/01/2021    9:54 AM 08/27/2021    2:57 PM 08/23/2020   10:48 AM 01/16/2015    1:37 PM  PHQ 2/9 Scores  PHQ - 2 Score 0 0 0 0 3  PHQ- 9 Score 0  0 0 13    Fall Risk    12/02/2022    3:14 PM 12/01/2021    9:53 AM 08/27/2021    2:57 PM 08/27/2021    2:56 PM 08/23/2020   10:48 AM  Fall Risk   Falls in the past year? 0 1 1 0 0  Number falls in past yr: 0 0 0 0 0  Injury with Fall? 0 0 1 0 0  Risk for fall due to : No Fall Risks Other (Comment) Impaired balance/gait  Medication side effect  Risk for fall due to: Comment  tangled in water hose     Follow up Falls prevention discussed;Falls evaluation completed Falls prevention discussed   Falls evaluation completed;Falls prevention  discussed    FALL RISK PREVENTION PERTAINING TO THE HOME:  Any stairs in or around the home? Yes  If so, are there any without handrails? No  Home free of loose throw rugs in walkways, pet beds, electrical cords, etc? Yes  Adequate lighting in your home to reduce risk of falls? Yes   ASSISTIVE DEVICES UTILIZED TO PREVENT FALLS:  Life alert? No  Use of a cane, walker or w/c? No  Grab bars in the bathroom? Yes  Shower chair or bench in shower? Yes  Elevated toilet seat or a handicapped toilet? No    Cognitive Function:    08/23/2020   10:50 AM  MMSE - Mini Mental State Exam  Orientation to time 5  Orientation to Place 5  Registration 3  Attention/ Calculation 5  Recall 3  Language- repeat 1        12/02/2022    3:19 PM  6CIT Screen  What Year? 0 points  What month? 0 points  What time? 0 points  Count back from 20 0 points  Months in reverse 0 points  Repeat phrase 0 points  Total Score 0 points    Immunizations Immunization History  Administered Date(s) Administered   Fluad Quad(high Dose 65+) 09/16/2022   Influenza,inj,Quad PF,6+ Mos 01/16/2015, 11/23/2017, 01/18/2019, 10/30/2019   PFIZER(Purple Top)SARS-COV-2 Vaccination 02/26/2020, 03/18/2020, 04/27/2020   PNEUMOCOCCAL CONJUGATE-20 05/30/2021   Pneumococcal Conjugate-13 08/23/2020   Pneumococcal Polysaccharide-23 03/30/2016   Td 03/30/2016   Tdap 02/24/2006   Zoster Recombinat (Shingrix) 05/30/2021, 08/23/2021   Zoster, Live 01/16/2015    TDAP status: Up to date  Flu Vaccine status: Up to date  Pneumococcal vaccine status: Up to date  Covid-19 vaccine status: Completed vaccines  Qualifies for Shingles Vaccine? Yes   Zostavax completed Yes   Shingrix Completed?: Yes  Screening Tests Health Maintenance  Topic Date Due   FOOT EXAM  08/27/2022   COVID-19 Vaccine (4 - 2023-24 season) 08/28/2022   OPHTHALMOLOGY EXAM  11/17/2022   HEMOGLOBIN A1C  03/17/2023   MAMMOGRAM  03/26/2023   Diabetic  kidney evaluation - Urine ACR  05/07/2023   Diabetic kidney evaluation - GFR measurement  06/13/2023   Medicare Annual Wellness (AWV)  12/03/2023   DTaP/Tdap/Td (3 - Td or Tdap) 03/30/2026   COLONOSCOPY (Pts 45-74yr Insurance coverage will need to be confirmed)  04/01/2026   Pneumonia Vaccine 69 Years old  Completed   INFLUENZA VACCINE  Completed   DEXA SCAN  Completed   Hepatitis C Screening  Completed   Zoster Vaccines- Shingrix  Completed   HPV VACCINES  Aged Out    Health Maintenance  Health Maintenance Due  Topic Date Due   FOOT EXAM  08/27/2022   COVID-19 Vaccine (4 - 2023-24 season) 08/28/2022   OPHTHALMOLOGY EXAM  11/17/2022    Colorectal cancer screening: Type of screening: Colonoscopy. Completed 04/01/16. Repeat every 10 years  Mammogram status: Completed 06/17/22. Repeat every year  Bone Density status: Completed 03/25/22. Results reflect: Bone density results: OSTEOPENIA. Repeat every 5 years.  Lung Cancer Screening: (Low Dose CT Chest recommended if Age 69-80years, 30 pack-year currently smoking OR have quit w/in 15years.) does not qualify.   Additional Screening:  Hepatitis C Screening: does qualify; Completed 11/23/17  Vision Screening: Recommended annual ophthalmology exams for early detection of glaucoma and other disorders of the eye. Is the patient up to date with their annual eye exam?  Yes  Who is the provider or what is the name of the office in which the patient attends annual [place] exams[place] is not established with a provider, would they like to be referred to a provider to establish care? No .   Dental Screening: Recommended annual dental exams for proper oral hygiene  Community Resource Referral / Chronic Care Management: CRR required this visit?  No   CCM required this visit?  No      Plan:     I have personally reviewed and noted the following in the patient's chart:   Medical and social history Use of alcohol, tobacco or  illicit drugs  Current medications and supplements including opioid prescriptions. Patient is not currently taking opioid prescriptions. Functional ability and status Nutritional status Physical activity Advanced directives List of other physicians Hospitalizations, surgeries, and ER visits in previous 12 months Vitals Screenings to include cognitive, depression, and falls Referrals and appointments  In addition, I have reviewed and discussed with patient certain preventive protocols, quality metrics, and best practice recommendations. A written personalized care plan for preventive services as well as general preventive health recommendations were provided to patient.     LDionisio David LPN   [id number]  Nurse Notes:  none

## 2022-12-02 NOTE — Patient Instructions (Signed)
Ms. Valerie Gordon , Thank you for taking time to come for your Medicare Wellness Visit. I appreciate your ongoing commitment to your health goals. Please review the following plan we discussed and let me know if I can assist you in the future.   Screening recommendations/referrals: Colonoscopy: 04/01/16 Mammogram: 06/17/22 Bone Density: 03/25/22 Recommended yearly ophthalmology/optometry visit for glaucoma screening and checkup Recommended yearly dental visit for hygiene and checkup  Vaccinations: Influenza vaccine: 09/16/22 Pneumococcal vaccine: 05/30/21 Tdap vaccine: 03/30/16 Shingles vaccine: Zostavax 01/16/15   Shingrix 05/30/21, 08/23/21   Covid-19:02/26/20, 03/18/20, 04/27/20  Advanced directives: no  Conditions/risks identified: none  Next appointment: Follow up in one year for your annual wellness visit 12/06/23 @ 10:15 am by phone   Preventive Care 65 Years and Older, Female Preventive care refers to lifestyle choices and visits with your health care provider that can promote health and wellness. What does preventive care include? A yearly physical exam. This is also called an annual well check. Dental exams once or twice a year. Routine eye exams. Ask your health care provider how often you should have your eyes checked. Personal lifestyle choices, including: Daily care of your teeth and gums. Regular physical activity. Eating a healthy diet. Avoiding tobacco and drug use. Limiting alcohol use. Practicing safe sex. Taking low-dose aspirin every day. Taking vitamin and mineral supplements as recommended by your health care provider. What happens during an annual well check? The services and screenings done by your health care provider during your annual well check will depend on your age, overall health, lifestyle risk factors, and family history of disease. Counseling  Your health care provider may ask you questions about your: Alcohol use. Tobacco use. Drug use. Emotional  well-being. Home and relationship well-being. Sexual activity. Eating habits. History of falls. Memory and ability to understand (cognition). Work and work Statistician. Reproductive health. Screening  You may have the following tests or measurements: Height, weight, and BMI. Blood pressure. Lipid and cholesterol levels. These may be checked every 5 years, or more frequently if you are over 47 years old. Skin check. Lung cancer screening. You may have this screening every year starting at age 33 if you have a 30-pack-year history of smoking and currently smoke or have quit within the past 15 years. Fecal occult blood test (FOBT) of the stool. You may have this test every year starting at age 19. Flexible sigmoidoscopy or colonoscopy. You may have a sigmoidoscopy every 5 years or a colonoscopy every 10 years starting at age 22. Hepatitis C blood test. Hepatitis B blood test. Sexually transmitted disease (STD) testing. Diabetes screening. This is done by checking your blood sugar (glucose) after you have not eaten for a while (fasting). You may have this done every 1-3 years. Bone density scan. This is done to screen for osteoporosis. You may have this done starting at age 80. Mammogram. This may be done every 1-2 years. Talk to your health care provider about how often you should have regular mammograms. Talk with your health care provider about your test results, treatment options, and if necessary, the need for more tests. Vaccines  Your health care provider may recommend certain vaccines, such as: Influenza vaccine. This is recommended every year. Tetanus, diphtheria, and acellular pertussis (Tdap, Td) vaccine. You may need a Td booster every 10 years. Zoster vaccine. You may need this after age 69. Pneumococcal 13-valent conjugate (PCV13) vaccine. One dose is recommended after age 35. Pneumococcal polysaccharide (PPSV23) vaccine. One dose is recommended after age  64. Talk to your  health care provider about which screenings and vaccines you need and how often you need them. This information is not intended to replace advice given to you by your health care provider. Make sure you discuss any questions you have with your health care provider. Document Released: 01/10/2016 Document Revised: 09/02/2016 Document Reviewed: 10/15/2015 Elsevier Interactive Patient Education  2017 [place] Prevention in the Home Falls can cause injuries. They can happen to people of all ages. There are many things you can do to make your home safe and to help prevent falls. What can I do on the outside of my home? Regularly fix the edges of walkways and driveways and fix any cracks. Remove anything that might make you trip as you walk through a door, such as a raised step or threshold. Trim any bushes or trees on the path to your home. Use bright outdoor lighting. Clear any walking paths of anything that might make someone trip, such as rocks or tools. Regularly check to see if handrails are loose or broken. Make sure that both sides of any steps have handrails. Any raised decks and porches should have guardrails on the edges. Have any leaves, snow, or ice cleared regularly. Use sand or salt on walking paths during winter. Clean up any spills in your garage right away. This includes oil or grease spills. What can I do in the bathroom? Use night lights. Install grab bars by the toilet and in the tub and shower. Do not use towel bars as grab bars. Use non-skid mats or decals in the tub or shower. If you need to sit down in the shower, use a plastic, non-slip stool. Keep the floor dry. Clean up any water that spills on the floor as soon as it happens. Remove soap buildup in the tub or shower regularly. Attach bath mats securely with double-sided non-slip rug tape. Do not have throw rugs and other things on the floor that can make you trip. What can I do in the bedroom? Use night  lights. Make sure that you have a light by your bed that is easy to reach. Do not use any sheets or blankets that are too big for your bed. They should not hang down onto the floor. Have a firm chair that has side arms. You can use this for support while you get dressed. Do not have throw rugs and other things on the floor that can make you trip. What can I do in the kitchen? Clean up any spills right away. Avoid walking on wet floors. Keep items that you use a lot in easy-to-reach places. If you need to reach something above you, use a strong step stool that has a grab bar. Keep electrical cords out of the way. Do not use floor polish or wax that makes floors slippery. If you must use wax, use non-skid floor wax. Do not have throw rugs and other things on the floor that can make you trip. What can I do with my stairs? Do not leave any items on the stairs. Make sure that there are handrails on both sides of the stairs and use them. Fix handrails that are broken or loose. Make sure that handrails are as long as the stairways. Check any carpeting to make sure that it is firmly attached to the stairs. Fix any carpet that is loose or worn. Avoid having throw rugs at the top or bottom of the stairs. If you do have  throw rugs, attach them to the floor with carpet tape. Make sure that you have a light switch at the top of the stairs and the bottom of the stairs. If you do not have them, ask someone to add them for you. What else can I do to help prevent falls? Wear shoes that: Do not have high heels. Have rubber bottoms. Are comfortable and fit you well. Are closed at the toe. Do not wear sandals. If you use a stepladder: Make sure that it is fully opened. Do not climb a closed stepladder. Make sure that both sides of the stepladder are locked into place. Ask someone to hold it for you, if possible. Clearly mark and make sure that you can see: Any grab bars or handrails. First and last  steps. Where the edge of each step is. Use tools that help you move around (mobility aids) if they are needed. These include: Canes. Walkers. Scooters. Crutches. Turn on the lights when you go into a dark area. Replace any light bulbs as soon as they burn out. Set up your furniture so you have a clear path. Avoid moving your furniture around. If any of your floors are uneven, fix them. If there are any pets around you, be aware of where they are. Review your medicines with your doctor. Some medicines can make you feel dizzy. This can increase your chance of falling. Ask your doctor what other things that you can do to help prevent falls. This information is not intended to replace advice given to you by your health care provider. Make sure you discuss any questions you have with your health care provider. Document Released: 10/10/2009 Document Revised: 05/21/2016 Document Reviewed: 01/18/2015 Elsevier Interactive Patient Education  2017 [employer].

## 2022-12-07 DIAGNOSIS — Z7984 Long term (current) use of oral hypoglycemic drugs: Secondary | ICD-10-CM | POA: Diagnosis not present

## 2022-12-07 DIAGNOSIS — Z8249 Family history of ischemic heart disease and other diseases of the circulatory system: Secondary | ICD-10-CM | POA: Diagnosis not present

## 2022-12-07 DIAGNOSIS — Z794 Long term (current) use of insulin: Secondary | ICD-10-CM | POA: Diagnosis not present

## 2022-12-07 DIAGNOSIS — Z853 Personal history of malignant neoplasm of breast: Secondary | ICD-10-CM | POA: Diagnosis not present

## 2022-12-07 DIAGNOSIS — Z6838 Body mass index (BMI) 38.0-38.9, adult: Secondary | ICD-10-CM | POA: Diagnosis not present

## 2022-12-07 DIAGNOSIS — Z823 Family history of stroke: Secondary | ICD-10-CM | POA: Diagnosis not present

## 2022-12-07 DIAGNOSIS — E119 Type 2 diabetes mellitus without complications: Secondary | ICD-10-CM | POA: Diagnosis not present

## 2022-12-07 DIAGNOSIS — K219 Gastro-esophageal reflux disease without esophagitis: Secondary | ICD-10-CM | POA: Diagnosis not present

## 2022-12-07 DIAGNOSIS — E039 Hypothyroidism, unspecified: Secondary | ICD-10-CM | POA: Diagnosis not present

## 2022-12-07 DIAGNOSIS — C50919 Malignant neoplasm of unspecified site of unspecified female breast: Secondary | ICD-10-CM | POA: Diagnosis not present

## 2022-12-07 DIAGNOSIS — Z79811 Long term (current) use of aromatase inhibitors: Secondary | ICD-10-CM | POA: Diagnosis not present

## 2022-12-11 ENCOUNTER — Encounter: Payer: Self-pay | Admitting: Internal Medicine

## 2022-12-11 ENCOUNTER — Inpatient Hospital Stay: Payer: Medicare HMO | Attending: Internal Medicine | Admitting: Internal Medicine

## 2022-12-11 VITALS — BP 127/87 | HR 96 | Temp 98.6°F | Resp 17 | Wt 208.0 lb

## 2022-12-11 DIAGNOSIS — Z800 Family history of malignant neoplasm of digestive organs: Secondary | ICD-10-CM | POA: Diagnosis not present

## 2022-12-11 DIAGNOSIS — Z170 Estrogen receptor positive status [ER+]: Secondary | ICD-10-CM | POA: Diagnosis not present

## 2022-12-11 DIAGNOSIS — Z79811 Long term (current) use of aromatase inhibitors: Secondary | ICD-10-CM | POA: Insufficient documentation

## 2022-12-11 DIAGNOSIS — Z801 Family history of malignant neoplasm of trachea, bronchus and lung: Secondary | ICD-10-CM | POA: Diagnosis not present

## 2022-12-11 DIAGNOSIS — E1136 Type 2 diabetes mellitus with diabetic cataract: Secondary | ICD-10-CM | POA: Insufficient documentation

## 2022-12-11 DIAGNOSIS — Z923 Personal history of irradiation: Secondary | ICD-10-CM | POA: Diagnosis not present

## 2022-12-11 DIAGNOSIS — C50411 Malignant neoplasm of upper-outer quadrant of right female breast: Secondary | ICD-10-CM

## 2022-12-11 MED ORDER — ANASTROZOLE 1 MG PO TABS: 1.0000 mg | ORAL_TABLET | Freq: Every day | ORAL | 1 refills | Status: DC

## 2022-12-11 NOTE — Progress Notes (Unsigned)
Patient here for oncology follow-up appointment, no new concerns . Reports slight hot flashes from anastrozole

## 2022-12-11 NOTE — Progress Notes (Unsigned)
[place] NOTE  Patient Care Team: Pleas Koch, NP as PCP - General (Internal Medicine) Ammie Dalton, Okey Regal, MD as Consulting Physician (Obstetrics and Gynecology) Bary Castilla, Forest Gleason, MD (General Surgery) Kennith Center, RD as Dietitian (Family Medicine) Theodore Demark, RN (Inactive) as Oncology Nurse Navigator Daiva Huge, RN as Oncology Nurse Navigator Cammie Sickle, MD as Consulting Physician (Oncology) Noreene Filbert, MD as Consulting Physician (Radiation Oncology)  CHIEF COMPLAINTS/PURPOSE OF CONSULTATION: Breast cancer  #  Oncology History Overview Note  IMPRESSION: 1. 1.3 cm irregular mass with associated distortion in the right breast at 11 o'clock, 2 cm the nipple, highly suspicious for malignancy. No abnormal right axillary lymph nodes.   RECOMMENDATION: 1. Ultrasound-guided core needle biopsy of the right breast highly suspicious mass. A.  BREAST MASS, RIGHT 11:00 2 CM FN; ULTRASOUND-GUIDED BIOPSY:  - INVASIVE MAMMARY CARCINOMA WITH LOBULAR FEATURES.   Size of invasive carcinoma: 8 mm in this sample  Histologic grade of invasive carcinoma: Grade 2                       Glandular/tubular differentiation score: 3                       Nuclear pleomorphism score: 2                       Mitotic rate score: 1                       Total score: 6  Ductal carcinoma in situ: Present, intermediate grade  Lymphovascular invasion: Not identified   ER/PR/HER2: Immunohistochemistry will be performed on block A2, with  reflex to [place] for HER2 2+. The results will be reported in an addendum.   Comment:  The definitive grade will be assigned on the excisional specimen.   GROSS DESCRIPTION:  A. Labeled: Right breast 11:00 2 cm from the nipple  Received: Formalin  Time/date in fixative: Collected and placed in formalin at 8:30 AM on  04/28/2022  Cold ischemic time: Less than 1 minute  Total fixation time: Approximately 9 hours  Core  pieces: 4  Size: Ranges from 0.5-2.0 cm in length and 0.2 cm in diameter  Description: Tan cores of fibrofatty tissue  Ink color: Black  Entirely submitted in 2 cassettes with 2 cores in A1 and 2 cores in A2.   CM 04/28/2022   Final Diagnosis performed by Quay Burow, MD.   Electronically signed  04/29/2022 1:04:41PM  The electronic signature indicates that the named Attending Pathologist  has evaluated the specimen  Technical component performed at [facility], [address] [place],  [place] [address] Lab: [phone] Dir: Rush Farmer, MD, MMM   Professional component performed at [facility], [facility], [place], [place],  [address] Lab: [phone]  Dir: Kathi Simpers, MD   ADDENDUM:  CASE SUMMARY: BREAST BIOMARKER TESTS  Estrogen Receptor (ER) Status: POSITIVE          Percentage of cells with nuclear positivity: Greater than 90%          Average intensity of staining: Strong   Progesterone Receptor (PgR) Status: POSITIVE          Percentage of cells with nuclear positivity: Greater than 90%          Average intensity of staining: Strong   HER2 (by immunohistochemistry): NEGATIVE (Score  0)  Ki-67: Not performed   #April 2023 clinical stage I -ER/PR positive HER2 =0 invasive mammary carcinoma with lobular features.  [Dr.Byrnett] May 2023 breast MRI-right side lesion-measuring 3.4 x 1.7 x 2.2 centimeters.     #May 2023 breast MRI showed-left breast indeterminate oval 1.5 centimeter mass in the UPPER-OUTER QUADRANT LEFT.  S/p biopsy positive for ER/PR positive DCIS. S/p lumpectomy  # Oncotype recurrence score 8-low risk; no chemotherapy  # Bilateral radiation-finished mid September 2023;   # Mid-OCT 2023- START Anastrazole      Carcinoma of upper-outer quadrant of right breast in female, estrogen receptor positive ([place])  05/06/2022 Initial Diagnosis   Carcinoma of upper-outer quadrant of right breast in female, estrogen receptor positive  ([place])   05/06/2022 Cancer Staging   Staging form: Breast, AJCC 8th Edition - Clinical: Stage IA (cT1c, cN0, cM0, G2, ER+, PR+, HER2-) - Signed by Cammie Sickle, MD on 05/06/2022 Histologic grading system: 3 grade system    HISTORY OF PRESENTING ILLNESS: Ambulating independently.  Alone.   Valerie Gordon 71 y.o.  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 Patient currently on anastrozole.   Patient here for oncology follow-up appointment, no new concerns . Reports slight hot flashes from anastrozole.   Patient feels overall stronger.  Fatigue improved.  Denies any worsening rash post radiation.  Review of Systems  Constitutional:  Negative for chills, diaphoresis, fever, malaise/fatigue and weight loss.  HENT:  Negative for nosebleeds and sore throat.   Eyes:  Negative for double vision.  Respiratory:  Negative for cough, hemoptysis, sputum production, shortness of breath and wheezing.   Cardiovascular:  Negative for chest pain, palpitations, orthopnea and leg swelling.  Gastrointestinal:  Negative for abdominal pain, blood in stool, constipation, diarrhea, heartburn, melena, nausea and vomiting.  Genitourinary:  Negative for dysuria, frequency and urgency.  Musculoskeletal:  Negative for back pain and joint pain.  Skin: Negative.  Negative for itching and rash.  Neurological:  Negative for dizziness, tingling, focal weakness, weakness and headaches.  Endo/Heme/Allergies:  Does not bruise/bleed easily.  Psychiatric/Behavioral:  Negative for depression. The patient is not nervous/anxious and does not have insomnia.      MEDICAL HISTORY:  Past Medical History:  Diagnosis Date   Arthritis    lumbar spine   Breast cancer ([place])    Cataract    COVID-19 virus infection 10/14/2021   Depression    Gallstones    asyptomatic   GERD  (gastroesophageal reflux disease)    Glaucoma    Headache    h/o migraines   Hyperlipidemia    Hypothyroidism    Obesity    Thyroid disease    Type 2 diabetes mellitus ([place])    manages with diet and exercise    SURGICAL HISTORY: Past Surgical History:  Procedure Laterality Date   AXILLARY SENTINEL NODE BIOPSY Right 06/17/2022   Procedure: AXILLARY SENTINEL NODE BIOPSY;  Surgeon: Robert Bellow, MD;  Location: [facility] ORS;  Service: General;  Laterality: Right;   BREAST BIOPSY  12/28/2008   BREAST BIOPSY Right 04/28/2022   [place] core bx 11:00 coil clip -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   BREAST LUMPECTOMY WITH NEEDLE LOCALIZATION Bilateral 06/17/2022   Procedure: BREAST LUMPECTOMY WITH NEEDLE LOCALIZATION;  Surgeon:  Robert Bellow, MD;  Location: [facility] ORS;  Service: General;  Laterality: Bilateral;   CARDIAC CATHETERIZATION  12/28/1996   and a glaucoma procedure as well that has a metal shunt in left eye   COLONOSCOPY  10/30/2004   COLONOSCOPY WITH PROPOFOL N/A 04/01/2016   Procedure: COLONOSCOPY WITH PROPOFOL;  Surgeon: Robert Bellow, MD;  Location: [facility] ENDOSCOPY;  Service: Endoscopy;  Laterality: N/A;   EYE SURGERY Bilateral    cataract sx   TONSILLECTOMY     WISDOM TOOTH EXTRACTION  1983    SOCIAL HISTORY: Social History   Socioeconomic History   Marital status: Widowed    Spouse name: Not on file   Number of children: Not on file   Years of education: Not on file   Highest education level: Not on file  Occupational History   Not on file  Tobacco Use   Smoking status: Never   Smokeless tobacco: Never  Vaping Use   Vaping Use: Never used  Substance and Sexual Activity   Alcohol use: Yes    Comment: occasional wine   Drug use: No   Sexual activity: Yes  Other Topics Concern   Not on file  Social History  Narrative   Widower.   Step children.   Environmental health practitioner.   Enjoys riding hot air balloons, spending time with friends, reading, jewelry   Social Determinants of Health   Financial Resource Strain: Low Risk  (12/02/2022)   Overall Financial Resource Strain (CARDIA)    Difficulty of Paying Living Expenses: Not hard at all  Food Insecurity: No Food Insecurity (12/02/2022)   Hunger Vital Sign    Worried About Running Out of Food in the Last Year: Never true    Ran Out of Food in the Last Year: Never true  Transportation Needs: No Transportation Needs (12/02/2022)   PRAPARE - Hydrologist (Medical): No    Lack of Transportation (Non-Medical): No  Physical Activity: Insufficiently Active (12/02/2022)   Exercise Vital Sign    Days of Exercise per Week: 5 days    Minutes of Exercise per Session: 20 min  Stress: No Stress Concern Present (12/02/2022)   [place]    Feeling of Stress : Not at all  Social Connections: Socially Isolated (12/02/2022)   Social Connection and Isolation Panel [NHANES]    Frequency of Communication with Friends and Family: More than three times a week    Frequency of Social Gatherings with Friends and Family: Once a week    Attends Religious Services: Never    Marine scientist or Organizations: No    Attends Archivist Meetings: Never    Marital Status: Widowed  Intimate Partner Violence: Not At Risk (12/02/2022)   Humiliation, Afraid, Rape, and Kick questionnaire    Fear of Current or Ex-Partner: No    Emotionally Abused: No    Physically Abused: No    Sexually Abused: No    FAMILY HISTORY: Family History  Problem Relation Age of Onset   Stroke Mother    Heart disease Father    Lung cancer Maternal Grandmother    Stomach cancer Paternal Grandmother    Breast cancer Neg Hx     ALLERGIES:  is allergic to ciprofloxacin; trulicity  [dulaglutide]; and antihistamines, chlorpheniramine-type.  MEDICATIONS:  Current Outpatient Medications  Medication Sig Dispense Refill   b complex vitamins tablet Take 1 tablet by mouth daily.  BIOTIN PO Take 5 mg by mouth daily.     Cholecalciferol 50 MCG (2000 UT) CAPS Take 1 capsule by mouth daily after lunch.     Continuous Blood Gluc Sensor (DEXCOM G7 SENSOR) MISC Apply every 10 days to check blood sugars. 3 each 3   famotidine (PEPCID) 20 MG tablet Take 20 mg by mouth at bedtime.     Fish Oil-Krill Oil (KRILL OIL PLUS PO) Take 1 tablet by mouth daily at 6 (six) AM.     fluticasone (FLONASE) 50 MCG/ACT nasal spray Place into both nostrils daily as needed for allergies or rhinitis.     glipiZIDE (GLUCOTROL) 5 MG tablet Take 1 tablet (5 mg total) by mouth 2 (two) times daily before a meal. For diabetes. 180 tablet 3   Insulin Glargine (BASAGLAR KWIKPEN) 100 UNIT/ML INJECT 32 UNITS SUBCUTANEOUSLY ONCE DAILY FOR DIABETES 30 mL 0   Insulin Pen Needle (B-D ULTRAFINE III SHORT PEN) 31G X 8 MM MISC USE WITH INSULIN NIGHTLY 100 each 1   latanoprost (XALATAN) 0.005 % ophthalmic solution Place 1 drop into both eyes every morning.     Magnesium 200 MG TABS Take 1 tablet by mouth at bedtime.     metFORMIN (GLUCOPHAGE-XR) 500 MG 24 hr tablet TAKE 2 TABLETS BY MOUTH TWICE DAILY WITH A MEAL FOR DIABETES 360 tablet 1   rosuvastatin (CRESTOR) 10 MG tablet TAKE 1 TABLET BY MOUTH ONCE DAILY FOR CHOLESTEROL (Patient taking differently: Take 10 mg by mouth at bedtime. TAKE 1 TABLET BY MOUTH ONCE DAILY FOR CHOLESTEROL) 90 tablet 3   VITAMIN A PO Take 2,400 mcg by mouth daily at 6 (six) AM.     anastrozole (ARIMIDEX) 1 MG tablet Take 1 tablet (1 mg total) by mouth daily. 90 tablet 1   levothyroxine (SYNTHROID) 100 MCG tablet TAKE 1 TABLET BY MOUTH IN THE MORNING ON  AN  EMPTY  STOMACH  WITH  WATER  ONLY  (NO  FOOD  OR  OTHER  MEDICATIONS  FOR  30  MINUTES) 90 tablet 0   No current facility-administered  medications for this visit.      Marland Kitchen  PHYSICAL EXAMINATION: ECOG PERFORMANCE STATUS: 0 - Asymptomatic  Vitals:   12/11/22 1029  BP: 127/87  Pulse: 96  Resp: 17  Temp: 98.6 F (37 C)  SpO2: 99%   Filed Weights   12/11/22 1029  Weight: 208 lb (94.3 kg)    Physical Exam Vitals and nursing note reviewed.  HENT:     Head: Normocephalic and atraumatic.     Mouth/Throat:     Pharynx: Oropharynx is clear.  Eyes:     Extraocular Movements: Extraocular movements intact.     Pupils: Pupils are equal, round, and reactive to light.  Cardiovascular:     Rate and Rhythm: Normal rate and regular rhythm.  Abdominal:     Palpations: Abdomen is soft.  Musculoskeletal:        General: Normal range of motion.     Cervical back: Normal range of motion.  Skin:    General: Skin is warm.  Neurological:     General: No focal deficit present.     Mental Status: She is alert and oriented to person, place, and time.  Psychiatric:        Behavior: Behavior normal.        Judgment: Judgment normal.      LABORATORY DATA:  I have reviewed the data as listed Lab Results  Component Value Date  WBC 6.6 09/02/2022   HGB 14.3 09/02/2022   HCT 42.0 09/02/2022   MCV 92.7 09/02/2022   PLT 216 09/02/2022   Recent Labs    01/12/22 0942 06/12/22 1441  NA 131* 137  K 4.9 4.1  CL 97 103  CO2 26 24  GLUCOSE 172* 127*  BUN 10 14  CREATININE 0.84 1.14*  CALCIUM 9.9 10.0  GFRNONAA  --  52*  PROT 7.3  --   ALBUMIN 4.5  --   AST 61*  --   ALT 26  --   ALKPHOS 70  --   BILITOT 0.5  --     RADIOGRAPHIC STUDIES: I have personally reviewed the radiological images as listed and agreed with the findings in the report. No results found.  ASSESSMENT & PLAN:   Carcinoma of upper-outer quadrant of right breast in female, estrogen receptor positive ([place]) # Right breast cancer stage I [id number]-INVASIVE MAMMARY CARCINOMA WITH DUCTAL AND LOBULAR FEATURES, 14 MM. PN=0; G-2. ?  Focal involvement of  the margin by DCIS; invasive carcinoma negative margins. Oncotype-RS =8; low risk.  S/p radiation.   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   #Patient started on Anastrazole [since Nov 1st 2023]-patient tolerating well except for mild hot flashes see below.  Clinically stable.  # Hot flashes- from anastrozole G-1 monitor now.   # Osteopenia: BMD March 2023- T-score of -1.9.  Continue On ca+vit D.  # DM- 2 on Insulin- [2-3 years]-recommend speaking to PCP regarding alternative GLP-1 agonist; especially the goal is to lose weight.   # Obesity- again reviewed  importance of healthy weight/and weight loss. Currently on enrolled in [facility] weight loss program.  # DISPOSITION: # follow up in 3  months  MD; no labs-Dr.B  Dr.Clark- [facility], [place].     All questions were answered. The patient/family knows to call the clinic with any problems, questions or concerns.       Cammie Sickle, MD 12/14/2022 9:40 AM

## 2022-12-11 NOTE — Assessment & Plan Note (Addendum)
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 PN=0; G-2. ?  Focal involvement of the margin by DCIS; invasive carcinoma negative margins. Oncotype-RS =8; low risk.  S/p radiation.   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   #Patient started on Anastrazole [since Nov 1st 2023]-patient tolerating well except for mild hot flashes see below.  Clinically stable.  # Hot flashes- from anastrozole G-1 monitor now.   # Osteopenia: BMD March 2023- T-score of -1.9.  Continue On ca+vit D.  # DM- 2 on Insulin- [2-3 years]-recommend speaking to PCP regarding alternative GLP-1 agonist; especially the goal is to lose weight.   # Obesity- again reviewed  importance of healthy weight/and weight loss. Currently on enrolled in [facility] weight loss program.  # DISPOSITION: # follow up in 3  months  MD; no labs-Dr.B  Dr.Clark- [facility], [place].

## 2022-12-13 ENCOUNTER — Other Ambulatory Visit: Payer: Self-pay | Admitting: Primary Care

## 2022-12-13 DIAGNOSIS — E1165 Type 2 diabetes mellitus with hyperglycemia: Secondary | ICD-10-CM

## 2022-12-13 DIAGNOSIS — E039 Hypothyroidism, unspecified: Secondary | ICD-10-CM

## 2022-12-17 ENCOUNTER — Other Ambulatory Visit: Payer: Self-pay | Admitting: Primary Care

## 2022-12-17 DIAGNOSIS — E1165 Type 2 diabetes mellitus with hyperglycemia: Secondary | ICD-10-CM

## 2022-12-28 ENCOUNTER — Other Ambulatory Visit: Payer: Self-pay | Admitting: Primary Care

## 2022-12-28 DIAGNOSIS — E119 Type 2 diabetes mellitus without complications: Secondary | ICD-10-CM

## 2022-12-31 DIAGNOSIS — D235 Other benign neoplasm of skin of trunk: Secondary | ICD-10-CM | POA: Diagnosis not present

## 2022-12-31 DIAGNOSIS — L988 Other specified disorders of the skin and subcutaneous tissue: Secondary | ICD-10-CM | POA: Diagnosis not present

## 2023-01-20 ENCOUNTER — Encounter: Payer: Self-pay | Admitting: Primary Care

## 2023-01-20 ENCOUNTER — Ambulatory Visit (INDEPENDENT_AMBULATORY_CARE_PROVIDER_SITE_OTHER): Payer: Medicare HMO | Admitting: Primary Care

## 2023-01-20 VITALS — BP 138/72 | HR 100 | Temp 97.3°F | Ht 61.0 in | Wt 207.0 lb

## 2023-01-20 DIAGNOSIS — Z0000 Encounter for general adult medical examination without abnormal findings: Secondary | ICD-10-CM | POA: Diagnosis not present

## 2023-01-20 DIAGNOSIS — F3342 Major depressive disorder, recurrent, in full remission: Secondary | ICD-10-CM

## 2023-01-20 DIAGNOSIS — Z170 Estrogen receptor positive status [ER+]: Secondary | ICD-10-CM | POA: Diagnosis not present

## 2023-01-20 DIAGNOSIS — R030 Elevated blood-pressure reading, without diagnosis of hypertension: Secondary | ICD-10-CM

## 2023-01-20 DIAGNOSIS — E1165 Type 2 diabetes mellitus with hyperglycemia: Secondary | ICD-10-CM

## 2023-01-20 DIAGNOSIS — E785 Hyperlipidemia, unspecified: Secondary | ICD-10-CM | POA: Diagnosis not present

## 2023-01-20 DIAGNOSIS — E039 Hypothyroidism, unspecified: Secondary | ICD-10-CM

## 2023-01-20 DIAGNOSIS — K219 Gastro-esophageal reflux disease without esophagitis: Secondary | ICD-10-CM

## 2023-01-20 DIAGNOSIS — C50411 Malignant neoplasm of upper-outer quadrant of right female breast: Secondary | ICD-10-CM | POA: Diagnosis not present

## 2023-01-20 DIAGNOSIS — R69 Illness, unspecified: Secondary | ICD-10-CM | POA: Diagnosis not present

## 2023-01-20 MED ORDER — BENZONATATE 200 MG PO CAPS: 200.0000 mg | ORAL_CAPSULE | Freq: Three times a day (TID) | ORAL | 0 refills | Status: DC | PRN

## 2023-01-20 NOTE — Assessment & Plan Note (Signed)
Repeat lipid panel pending. Continue rosuvastatin 10 mg.

## 2023-01-20 NOTE — Patient Instructions (Signed)
Stop by the lab prior to leaving today. I will notify you of your results once received.   Be sure to take your levothyroxine (thyroid medication) every morning on an empty stomach with water only.   No food or other medications for 30 minutes.   No heartburn medication, iron pills, calcium, vitamin D, or magnesium pills within four hours of taking levothyroxine.   Please schedule a follow up visit for 6 months for a diabetes check.  It was a pleasure to see you today!

## 2023-01-20 NOTE — Assessment & Plan Note (Signed)
Overall okay given age, continue to monitor.

## 2023-01-20 NOTE — Progress Notes (Signed)
Subjective:    Patient ID: Valerie Gordon, female    DOB: May 07, 1951, 72 y.o.   MRN: [id number]  HPI  Valerie Gordon is a very pleasant 72 y.o. female with a history of breast cancer, type 2 diabetes, hyperlipidemia who presents today for complete physical and follow up of chronic conditions.  1) Type 2 Diabetes:  Currently managed on Basaglar 32 units daily, glipizide 5 mg BID, and Metformin XR 1000 mg daily.   She is checking glucose levels 2 times weekly and is getting readings in the 180's low 200's.   Immunizations: -Tetanus: Completed in 2017 -Influenza: Completed this season -Shingles: Completed Shingrix series -Pneumonia: Completed Prevnar 13 in 2021 and Pneumovax in 2017  Diet: [place].  Exercise: No regular exercise  Eye exam: Completes annually  Dental exam: Completes semi-annually   Mammogram: Completed in June 2023  Colonoscopy: Completed in 2017, due 2027 Lung Cancer Screening: Completed in  Dexa: Completed in March 2023  BP Readings from Last 3 Encounters:  01/20/23 138/72  12/11/22 127/87  10/28/22 138/76   Wt Readings from Last 3 Encounters:  01/20/23 207 lb (93.9 kg)  12/11/22 208 lb (94.3 kg)  12/02/22 208 lb (94.3 kg)     Review of Systems  Constitutional:  Negative for unexpected weight change.  HENT:  Negative for rhinorrhea.   Respiratory:  Negative for cough and shortness of breath.   Cardiovascular:  Negative for chest pain.  Gastrointestinal:  Negative for constipation and diarrhea.  Genitourinary:  Negative for difficulty urinating and menstrual problem.  Musculoskeletal:  Negative for arthralgias and myalgias.  Skin:  Negative for rash.  Allergic/Immunologic: Negative for environmental allergies.  Neurological:  Negative for dizziness, numbness and headaches.  Psychiatric/Behavioral:  The patient is not nervous/anxious.          Past Medical History:  Diagnosis Date   Arthritis    lumbar spine   Breast cancer ([place])     Cataract    COVID-19 virus infection 10/14/2021   Depression    Gallstones    asyptomatic   GERD (gastroesophageal reflux disease)    Glaucoma    Headache    h/o migraines   Hyperlipidemia    Hypothyroidism    Obesity    Thyroid disease    Type 2 diabetes mellitus ([place])    manages with diet and exercise    Social History   Socioeconomic History   Marital status: Widowed    Spouse name: Not on file   Number of children: Not on file   Years of education: Not on file   Highest education level: Not on file  Occupational History   Not on file  Tobacco Use   Smoking status: Never   Smokeless tobacco: Never  Vaping Use   Vaping Use: Never used  Substance and Sexual Activity   Alcohol use: Yes    Comment: occasional wine   Drug use: No   Sexual activity: Yes  Other Topics Concern   Not on file  Social History Narrative   Widower.   Step children.   Environmental health practitioner.   Enjoys riding hot air balloons, spending time with friends, reading, jewelry   Social Determinants of Health   Financial Resource Strain: Low Risk  (12/02/2022)   Overall Financial Resource Strain (CARDIA)    Difficulty of Paying Living Expenses: Not hard at all  Food Insecurity: No Food Insecurity (12/02/2022)   Hunger Vital Sign    Worried About Running  Out of Food in the Last Year: Never true    Ran Out of Food in the Last Year: Never true  Transportation Needs: No Transportation Needs (12/02/2022)   PRAPARE - Hydrologist (Medical): No    Lack of Transportation (Non-Medical): No  Physical Activity: Insufficiently Active (12/02/2022)   Exercise Vital Sign    Days of Exercise per Week: 5 days    Minutes of Exercise per Session: 20 min  Stress: No Stress Concern Present (12/02/2022)   [place]    Feeling of Stress : Not at all  Social Connections: Socially Isolated (12/02/2022)   Social  Connection and Isolation Panel [NHANES]    Frequency of Communication with Friends and Family: More than three times a week    Frequency of Social Gatherings with Friends and Family: Once a week    Attends Religious Services: Never    Marine scientist or Organizations: No    Attends Archivist Meetings: Never    Marital Status: Widowed  Intimate Partner Violence: Not At Risk (12/02/2022)   Humiliation, Afraid, Rape, and Kick questionnaire    Fear of Current or Ex-Partner: No    Emotionally Abused: No    Physically Abused: No    Sexually Abused: No    Past Surgical History:  Procedure Laterality Date   AXILLARY SENTINEL NODE BIOPSY Right 06/17/2022   Procedure: AXILLARY SENTINEL NODE BIOPSY;  Surgeon: Robert Bellow, MD;  Location: [facility] ORS;  Service: General;  Laterality: Right;   BREAST BIOPSY  12/28/2008   BREAST BIOPSY Right 04/28/2022   [place] core bx 11:00 coil clip -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   BREAST LUMPECTOMY WITH NEEDLE LOCALIZATION Bilateral 06/17/2022   Procedure: BREAST LUMPECTOMY WITH NEEDLE LOCALIZATION;  Surgeon: Robert Bellow, MD;  Location: [facility] ORS;  Service: General;  Laterality: Bilateral;   CARDIAC CATHETERIZATION  12/28/1996   and a glaucoma procedure as well that has a metal shunt in left eye   COLONOSCOPY  10/30/2004   COLONOSCOPY WITH PROPOFOL N/A 04/01/2016   Procedure: COLONOSCOPY WITH PROPOFOL;  Surgeon: Robert Bellow, MD;  Location: [facility] ENDOSCOPY;  Service: Endoscopy;  Laterality: N/A;   EYE SURGERY Bilateral    cataract sx   TONSILLECTOMY     WISDOM TOOTH EXTRACTION  1983    Family History  Problem Relation Age of Onset   Stroke Mother    Heart disease Father    Lung cancer Maternal Grandmother    Stomach cancer Paternal Grandmother    Breast cancer Neg Hx      Allergies  Allergen Reactions   Ciprofloxacin Swelling    At the injection site   Trulicity [Dulaglutide]     Itch at inj site   Antihistamines, Chlorpheniramine-Type Palpitations    Current Outpatient Medications on File Prior to Visit  Medication Sig Dispense Refill   anastrozole (ARIMIDEX) 1 MG tablet Take 1 tablet (1 mg total) by mouth daily. 90 tablet 1   b complex vitamins tablet Take 1 tablet by mouth daily.     BIOTIN PO Take 5 mg by mouth daily.     Cholecalciferol 50 MCG (2000 UT) CAPS Take 1 capsule by mouth daily after lunch.     famotidine (PEPCID) 20 MG tablet Take 20 mg by mouth at  bedtime.     Fish Oil-Krill Oil (KRILL OIL PLUS PO) Take 1 tablet by mouth daily at 6 (six) AM.     fluticasone (FLONASE) 50 MCG/ACT nasal spray Place into both nostrils daily as needed for allergies or rhinitis.     glipiZIDE (GLUCOTROL) 5 MG tablet TAKE 1 TABLET BY MOUTH TWICE DAILY BEFORE A MEAL FOR DIABETES 180 tablet 0   Insulin Glargine (BASAGLAR KWIKPEN) 100 UNIT/ML INJECT 32 UNITS SUBCUTANEOUSLY ONCE DAILY FOR DIABETES 30 mL 0   Insulin Pen Needle (B-D ULTRAFINE III SHORT PEN) 31G X 8 MM MISC USE WITH INSULIN NIGHTLY 100 each 1   latanoprost (XALATAN) 0.005 % ophthalmic solution Place 1 drop into both eyes every morning.     levothyroxine (SYNTHROID) 100 MCG tablet TAKE 1 TABLET BY MOUTH IN THE MORNING ON  AN  EMPTY  STOMACH  WITH  WATER  ONLY  (NO  FOOD  OR  OTHER  MEDICATIONS  FOR  30  MINUTES) 90 tablet 0   Magnesium 200 MG TABS Take 1 tablet by mouth at bedtime.     metFORMIN (GLUCOPHAGE-XR) 500 MG 24 hr tablet TAKE 2 TABLETS BY MOUTH TWICE DAILY WITH A MEAL FOR DIABETES 360 tablet 0   rosuvastatin (CRESTOR) 10 MG tablet TAKE 1 TABLET BY MOUTH ONCE DAILY FOR CHOLESTEROL (Patient taking differently: Take 10 mg by mouth at bedtime. TAKE 1 TABLET BY MOUTH ONCE DAILY FOR CHOLESTEROL) 90 tablet 3   VITAMIN A PO Take 2,400 mcg by mouth daily at 6 (six) AM.     Continuous Blood Gluc  Sensor (DEXCOM G7 SENSOR) MISC Apply every 10 days to check blood sugars. (Patient not taking: Reported on 01/20/2023) 3 each 3   No current facility-administered medications on file prior to visit.    BP 138/72   Pulse 100   Temp (!) 97.3 F (36.3 C) (Temporal)   Ht '5\' 1"'$  (1.549 m)   Wt 207 lb (93.9 kg)   SpO2 97%   BMI 39.11 kg/m  Objective:   Physical Exam HENT:     Right Ear: Tympanic membrane and ear canal normal.     Left Ear: Tympanic membrane and ear canal normal.     Nose: Nose normal.  Eyes:     Conjunctiva/sclera: Conjunctivae normal.     Pupils: Pupils are equal, round, and reactive to light.  Neck:     Thyroid: No thyromegaly.  Cardiovascular:     Rate and Rhythm: Normal rate and regular rhythm.     Heart sounds: No murmur heard. Pulmonary:     Effort: Pulmonary effort is normal.     Breath sounds: Normal breath sounds. No rales.  Abdominal:     General: Bowel sounds are normal.     Palpations: Abdomen is soft.     Tenderness: There is no abdominal tenderness.  Musculoskeletal:        General: Normal range of motion.     Cervical back: Neck supple.  Lymphadenopathy:     Cervical: No cervical adenopathy.  Skin:    General: Skin is warm and dry.     Findings: No rash.  Neurological:     Mental Status: She is alert and oriented to person, place, and time.     Cranial Nerves: No cranial nerve deficit.     Deep Tendon Reflexes: Reflexes are normal and symmetric.  Psychiatric:        Mood and Affect: Mood normal.  Assessment & Plan:  Preventative health care Assessment & Plan: Immunizations UTD.  Mammogram and bone density scan UTD. Colonoscopy UTD, due 2027  Discussed the importance of a healthy diet and regular exercise in order for weight loss, and to reduce the risk of further co-morbidity.  Exam stable. Labs pending.  Follow up in 1 year for repeat physical.    Type 2 diabetes mellitus with hyperglycemia, unspecified whether  long term insulin use ([place]) Assessment & Plan: Repeat A1C pending. There is some inconsistency with taking medications. She will work to do better with this. Discussed importance of checking glucose levels daily, especially given that she's on insulin.   Continue Basaglar 32 units daily, Metformin 1000 mg in AM, glipizide 5 mg BID. We discussed addition of GLP 2 agonist, would try Mounjaro as she experienced itching with Trulicity.   Follow up in 3-6 months based on A1C result.  Orders: -     Hemoglobin A1c  Hypothyroidism, unspecified type Assessment & Plan: She is taking levothyroxine with all of her medications.   Repeat TSH pending.  Continue levothyroxine 100 mcg daily.  Orders: -     TSH  Hyperlipidemia, unspecified hyperlipidemia type Assessment & Plan: Repeat lipid panel pending. Continue rosuvastatin 10 mg.  Orders: -     Lipid panel -     Comprehensive metabolic panel  Carcinoma of upper-outer quadrant of right breast in female, estrogen receptor positive [facility]) Assessment & Plan: Following with oncology, reviewed office notes from December 2023. Continue anastrazole 1 mg daily. Repeat mammogram due this Summer   Gastroesophageal reflux disease without esophagitis Assessment & Plan: Controlled.  Continue famotidine 20 mg HS.   Elevated blood pressure reading without diagnosis of hypertension Assessment & Plan: Overall okay given age, continue to monitor.   Recurrent major depressive disorder, in full remission [facility]) Assessment & Plan: Controlled. No concerns today.  Continue to monitor.    Other orders -     Benzonatate; Take 1 capsule (200 mg total) by mouth 3 (three) times daily as needed for cough.  Dispense: 15 capsule; Refill: 0        Pleas Koch, NP

## 2023-01-20 NOTE — Assessment & Plan Note (Signed)
Following with oncology, reviewed office notes from December 2023. Continue anastrazole 1 mg daily. Repeat mammogram due this Summer

## 2023-01-20 NOTE — Assessment & Plan Note (Addendum)
She is taking levothyroxine with all of her medications.   Repeat TSH pending.  Continue levothyroxine 100 mcg daily.

## 2023-01-20 NOTE — Assessment & Plan Note (Signed)
Controlled.  Continue famotidine 20 mg HS.

## 2023-01-20 NOTE — Assessment & Plan Note (Signed)
Immunizations UTD.  Mammogram and bone density scan UTD. Colonoscopy UTD, due 2027  Discussed the importance of a healthy diet and regular exercise in order for weight loss, and to reduce the risk of further co-morbidity.  Exam stable. Labs pending.  Follow up in 1 year for repeat physical.

## 2023-01-20 NOTE — Assessment & Plan Note (Signed)
Controlled. No concerns today.  Continue to monitor.

## 2023-01-20 NOTE — Assessment & Plan Note (Addendum)
Repeat A1C pending. There is some inconsistency with taking medications. She will work to do better with this. Discussed importance of checking glucose levels daily, especially given that she's on insulin.   Continue Basaglar 32 units daily, Metformin 1000 mg in AM, glipizide 5 mg BID. We discussed addition of GLP 2 agonist, would try Mounjaro as she experienced itching with Trulicity.   Follow up in 3-6 months based on A1C result.

## 2023-01-21 ENCOUNTER — Other Ambulatory Visit: Payer: Self-pay | Admitting: Primary Care

## 2023-01-21 DIAGNOSIS — E1165 Type 2 diabetes mellitus with hyperglycemia: Secondary | ICD-10-CM

## 2023-01-21 LAB — COMPREHENSIVE METABOLIC PANEL
ALT: 14 U/L (ref 0–35)
AST: 34 U/L (ref 0–37)
Albumin: 4.4 g/dL (ref 3.5–5.2)
Alkaline Phosphatase: 73 U/L (ref 39–117)
BUN: 9 mg/dL (ref 6–23)
CO2: 23 mEq/L (ref 19–32)
Calcium: 10.2 mg/dL (ref 8.4–10.5)
Chloride: 98 mEq/L (ref 96–112)
Creatinine, Ser: 0.9 mg/dL (ref 0.40–1.20)
GFR: 65.22 mL/min (ref >60.00)
Glucose, Bld: 145 mg/dL — ABNORMAL HIGH (ref 70–99)
Potassium: 4.2 mEq/L (ref 3.5–5.1)
Sodium: 134 mEq/L — ABNORMAL LOW (ref 135–145)
Total Bilirubin: 0.3 mg/dL (ref 0.2–1.2)
Total Protein: 7.1 g/dL (ref 6.0–8.3)

## 2023-01-21 LAB — LIPID PANEL
Cholesterol: 139 mg/dL (ref 0–200)
HDL: 55.6 mg/dL (ref >39.00)
NonHDL: 82.99
Total CHOL/HDL Ratio: 2
Triglycerides: 207 mg/dL — ABNORMAL HIGH (ref 0.0–149.0)
VLDL: 41.4 mg/dL — ABNORMAL HIGH (ref 0.0–40.0)

## 2023-01-21 LAB — LDL CHOLESTEROL, DIRECT: Direct LDL: 57 mg/dL

## 2023-01-21 LAB — HEMOGLOBIN A1C: Hgb A1c MFr Bld: 8.3 % — ABNORMAL HIGH (ref 4.6–6.5)

## 2023-01-21 LAB — TSH: TSH: 2.47 u[IU]/mL (ref 0.35–5.50)

## 2023-01-21 MED ORDER — TIRZEPATIDE 2.5 MG/0.5ML ~~LOC~~ SOAJ: 2.5000 mg | SUBCUTANEOUS | 0 refills | Status: DC

## 2023-02-25 ENCOUNTER — Encounter: Payer: Self-pay | Admitting: Primary Care

## 2023-03-10 ENCOUNTER — Other Ambulatory Visit: Payer: Self-pay | Admitting: Primary Care

## 2023-03-10 ENCOUNTER — Telehealth: Payer: Medicare HMO | Admitting: Family

## 2023-03-10 DIAGNOSIS — E119 Type 2 diabetes mellitus without complications: Secondary | ICD-10-CM

## 2023-03-10 DIAGNOSIS — R197 Diarrhea, unspecified: Secondary | ICD-10-CM | POA: Diagnosis not present

## 2023-03-10 MED ORDER — LOPERAMIDE HCL 2 MG PO TABS: 2.0000 mg | ORAL_TABLET | Freq: Four times a day (QID) | ORAL | 0 refills | Status: DC | PRN

## 2023-03-10 NOTE — Progress Notes (Signed)
Virtual Visit Consent   Valerie Gordon, you are scheduled for a virtual visit with a [facility] provider today. Just as with appointments in the office, your consent must be obtained to participate. Your consent will be active for this visit and any virtual visit you may have with one of our providers in the next 365 days. If you have a MyChart account, a copy of this consent can be sent to you electronically.  As this is a virtual visit, video technology does not allow for your provider to perform a traditional examination. This may limit your provider's ability to fully assess your condition. If your provider identifies any concerns that need to be evaluated in person or the need to arrange testing (such as labs, EKG, etc.), we will make arrangements to do so. Although advances in technology are sophisticated, we cannot ensure that it will always work on either your end or our end. If the connection with a video visit is poor, the visit may have to be switched to a telephone visit. With either a video or telephone visit, we are not always able to ensure that we have a secure connection.  By engaging in this virtual visit, you consent to the provision of healthcare and authorize for your insurance to be billed (if applicable) for the services provided during this visit. Depending on your insurance coverage, you may receive a charge related to this service.  I need to obtain your verbal consent now. Are you willing to proceed with your visit today? [name] has provided verbal consent on 03/10/2023 for a virtual visit (video or telephone). Evelina Dun, FNP  Date: 03/10/2023 2:02 PM  Virtual Visit via Video Note   I, Evelina Dun, connected with  Valerie Gordon  ([id number], [birth year]) on 03/10/23 at  2:00 PM EDT by a video-enabled telemedicine application and verified that I am speaking with the correct person using two identifiers.  Location: Patient: Virtual Visit Location Patient:  Home Provider: Virtual Visit Location Provider: Home Office   I discussed the limitations of evaluation and management by telemedicine and the availability of in person appointments. The patient expressed understanding and agreed to proceed.    History of Present Illness: Valerie Gordon is a 72 y.o. who identifies as a female who was assigned female at birth, and is being seen today for diarrhea that started Sunday. She got home from a 7 day cruise from the [place].   HPI: Diarrhea  This is a new problem. The current episode started in the past 7 days. The problem occurs 5 to 10 times per day. The problem has been gradually improving. The stool consistency is described as Watery. Associated symptoms include bloating and increased flatus. Pertinent negatives include no abdominal pain, coughing, fever, headaches or vomiting. Chills: improved.She has tried electrolyte solution (force fluids, BRAT diet) for the symptoms. The treatment provided mild relief.    Problems:  Patient Active Problem List   Diagnosis Date Noted   Carcinoma of upper-outer quadrant of right breast in female, estrogen receptor positive ([place]) 05/06/2022   Elevated blood pressure reading without diagnosis of hypertension 02/11/2022   Preventative health care 03/30/2016   Vitamin D deficiency 03/30/2016   Type 2 diabetes mellitus ([place]) 03/16/2016   GERD (gastroesophageal reflux disease) 01/16/2015   Depression 01/16/2015   Hyperlipidemia 01/16/2015   Obesity (BMI 30-39.9) 05/31/2012   Hypothyroidism 05/31/2012    Allergies:  Allergies  Allergen Reactions   Ciprofloxacin Swelling  At the injection site   Trulicity [Dulaglutide]     Itch at inj site   Antihistamines, Chlorpheniramine-Type Palpitations   Medications:  Current Outpatient Medications:    loperamide (IMODIUM A-D) 2 MG tablet, Take 1 tablet (2 mg total) by mouth 4 (four) times daily as needed for diarrhea or loose stools., Disp: 30 tablet, Rfl: 0    anastrozole (ARIMIDEX) 1 MG tablet, Take 1 tablet (1 mg total) by mouth daily., Disp: 90 tablet, Rfl: 1   b complex vitamins tablet, Take 1 tablet by mouth daily., Disp: , Rfl:    benzonatate (TESSALON) 200 MG capsule, Take 1 capsule (200 mg total) by mouth 3 (three) times daily as needed for cough., Disp: 15 capsule, Rfl: 0   BIOTIN PO, Take 5 mg by mouth daily., Disp: , Rfl:    Cholecalciferol 50 MCG (2000 UT) CAPS, Take 1 capsule by mouth daily after lunch., Disp: , Rfl:    Continuous Blood Gluc Sensor (DEXCOM G7 SENSOR) MISC, Apply every 10 days to check blood sugars. (Patient not taking: Reported on 01/20/2023), Disp: 3 each, Rfl: 3   famotidine (PEPCID) 20 MG tablet, Take 20 mg by mouth at bedtime., Disp: , Rfl:    Fish Oil-Krill Oil (KRILL OIL PLUS PO), Take 1 tablet by mouth daily at 6 (six) AM., Disp: , Rfl:    fluticasone (FLONASE) 50 MCG/ACT nasal spray, Place into both nostrils daily as needed for allergies or rhinitis., Disp: , Rfl:    glipiZIDE (GLUCOTROL) 5 MG tablet, TAKE 1 TABLET BY MOUTH TWICE DAILY BEFORE A MEAL FOR DIABETES, Disp: 180 tablet, Rfl: 0   Insulin Glargine (BASAGLAR KWIKPEN) 100 UNIT/ML, INJECT 32 UNITS SUBCUTANEOUSLY ONCE DAILY FOR DIABETES, Disp: 30 mL, Rfl: 0   Insulin Pen Needle (B-D ULTRAFINE III SHORT PEN) 31G X 8 MM MISC, USE WITH INSULIN NIGHTLY, Disp: 100 each, Rfl: 1   latanoprost (XALATAN) 0.005 % ophthalmic solution, Place 1 drop into both eyes every morning., Disp: , Rfl:    levothyroxine (SYNTHROID) 100 MCG tablet, TAKE 1 TABLET BY MOUTH IN THE MORNING ON  AN  EMPTY  STOMACH  WITH  WATER  ONLY  (NO  FOOD  OR  OTHER  MEDICATIONS  FOR  30  MINUTES), Disp: 90 tablet, Rfl: 0   Magnesium 200 MG TABS, Take 1 tablet by mouth at bedtime., Disp: , Rfl:    metFORMIN (GLUCOPHAGE-XR) 500 MG 24 hr tablet, TAKE 2 TABLETS BY MOUTH TWICE DAILY WITH A MEAL FOR DIABETES, Disp: 360 tablet, Rfl: 0   rosuvastatin (CRESTOR) 10 MG tablet, TAKE 1 TABLET BY MOUTH ONCE DAILY FOR  CHOLESTEROL (Patient taking differently: Take 10 mg by mouth at bedtime. TAKE 1 TABLET BY MOUTH ONCE DAILY FOR CHOLESTEROL), Disp: 90 tablet, Rfl: 3   tirzepatide (MOUNJARO) 2.5 MG/0.5ML Pen, Inject 2.5 mg into the skin once a week. for diabetes., Disp: 2 mL, Rfl: 0   VITAMIN A PO, Take 2,400 mcg by mouth daily at 6 (six) AM., Disp: , Rfl:   Observations/Objective: Patient is well-developed, well-nourished in no acute distress.  Resting comfortably  at home.  Head is normocephalic, atraumatic.  No labored breathing.  Speech is clear and coherent with logical content.  Patient is alert and oriented at baseline.    Assessment and Plan: 1. Diarrhea, unspecified type - loperamide (IMODIUM A-D) 2 MG tablet; Take 1 tablet (2 mg total) by mouth 4 (four) times daily as needed for diarrhea or loose stools.  Dispense: 30 tablet;  Refill: 0  BRAT diet  Force fluids Imodium as needed  Signs and symptoms discussed about dehydration Follow up if symptoms worsen or do not improve   Follow Up Instructions: I discussed the assessment and treatment plan with the patient. The patient was provided an opportunity to ask questions and all were answered. The patient agreed with the plan and demonstrated an understanding of the instructions.  A copy of instructions were sent to the patient via MyChart unless otherwise noted below.     The patient was advised to call back or seek an in-person evaluation if the symptoms worsen or if the condition fails to improve as anticipated.  Time:  I spent 9 minutes with the patient via telehealth technology discussing the above problems/concerns.    Evelina Dun, FNP

## 2023-03-12 ENCOUNTER — Inpatient Hospital Stay: Payer: Medicare HMO | Admitting: Internal Medicine

## 2023-03-17 DIAGNOSIS — L821 Other seborrheic keratosis: Secondary | ICD-10-CM | POA: Diagnosis not present

## 2023-03-17 DIAGNOSIS — L298 Other pruritus: Secondary | ICD-10-CM | POA: Diagnosis not present

## 2023-03-19 ENCOUNTER — Encounter: Payer: Self-pay | Admitting: Primary Care

## 2023-03-19 ENCOUNTER — Ambulatory Visit (INDEPENDENT_AMBULATORY_CARE_PROVIDER_SITE_OTHER): Payer: Medicare HMO | Admitting: Primary Care

## 2023-03-19 VITALS — BP 136/82 | HR 85 | Temp 97.9°F | Ht 61.0 in | Wt 206.0 lb

## 2023-03-19 DIAGNOSIS — R197 Diarrhea, unspecified: Secondary | ICD-10-CM

## 2023-03-19 LAB — BASIC METABOLIC PANEL
BUN: 10 mg/dL (ref 6–23)
CO2: 28 mEq/L (ref 19–32)
Calcium: 10.1 mg/dL (ref 8.4–10.5)
Chloride: 99 mEq/L (ref 96–112)
Creatinine, Ser: 0.93 mg/dL (ref 0.40–1.20)
GFR: 62.63 mL/min (ref >60.00)
Glucose, Bld: 237 mg/dL — ABNORMAL HIGH (ref 70–99)
Potassium: 4.5 mEq/L (ref 3.5–5.1)
Sodium: 134 mEq/L — ABNORMAL LOW (ref 135–145)

## 2023-03-19 LAB — CBC WITH DIFFERENTIAL/PLATELET
Basophils Absolute: 0.1 10*3/uL (ref 0.0–0.1)
Basophils Relative: 1.3 % (ref 0.0–3.0)
Eosinophils Absolute: 0.3 10*3/uL (ref 0.0–0.7)
Eosinophils Relative: 4.3 % (ref 0.0–5.0)
HCT: 40 % (ref 36.0–46.0)
Hemoglobin: 13.6 g/dL (ref 12.0–15.0)
Lymphocytes Relative: 18 % (ref 12.0–46.0)
Lymphs Abs: 1.4 10*3/uL (ref 0.7–4.0)
MCHC: 34 g/dL (ref 30.0–36.0)
MCV: 92.8 fl (ref 78.0–100.0)
Monocytes Absolute: 0.6 10*3/uL (ref 0.1–1.0)
Monocytes Relative: 8.2 % (ref 3.0–12.0)
Neutro Abs: 5.1 10*3/uL (ref 1.4–7.7)
Neutrophils Relative %: 68.2 % (ref 43.0–77.0)
Platelets: 281 10*3/uL (ref 150.0–400.0)
RBC: 4.31 Mil/uL (ref 3.87–5.11)
RDW: 13.3 % (ref 11.5–15.5)
WBC: 7.5 10*3/uL (ref 4.0–10.5)

## 2023-03-19 NOTE — Patient Instructions (Signed)
Stop by the lab prior to leaving today. I will notify you of your results once received.   Return the stool kit when possible.  It was a pleasure to see you today!

## 2023-03-19 NOTE — Assessment & Plan Note (Signed)
Likely viral given recent travel.  Checking CBC with diff and BMP today.  Checking stool studies.  Discussed the importance of hydration and to advance diet slowly. Remain off Imodium for now.  Await results.

## 2023-03-19 NOTE — Addendum Note (Signed)
Addended by: Ellamae Sia on: 03/19/2023 11:46 AM   Modules accepted: Orders

## 2023-03-19 NOTE — Progress Notes (Signed)
Subjective:    Patient ID: Valerie Gordon, female    DOB: 05-05-1951, 72 y.o.   MRN: [id number]  Diarrhea  Associated symptoms include abdominal pain. Pertinent negatives include no fever.    Valerie Gordon is a very pleasant 72 y.o. female with a history of GERD, hypothyroidism, type 2 diabetes, hyperlipidemia, right breast cancer who presents today to discuss diarrhea.  Symptom onset about two weeks ago just after returning from a cruise which sailed to [place], and [place]. She was completed a virtual care visit on 03/10/23 and was advised to follow a BRAT diet, increase water intake, start Imodium QID PRN.   Today she discusses that her diarrhea continues but is overall better. She is experiencing about 2-3 episodes daily of loose stools. Previously experiencing 10-15 episodes daily.  She is drinking coconut water and advancing her diet as tolerated.   She does experience bilateral abdominal cramping with gas during diarrhea episodes. Her last dose of Imodium was 2 days ago. Her last episode of diarrhea was this morning, loose stool. No recent antibiotic use.    Review of Systems  Constitutional:  Negative for fever.  Gastrointestinal:  Positive for abdominal pain and diarrhea.         Past Medical History:  Diagnosis Date   Arthritis    lumbar spine   Breast cancer ([place])    Cataract    COVID-19 virus infection 10/14/2021   Depression    Gallstones    asyptomatic   GERD (gastroesophageal reflux disease)    Glaucoma    Headache    h/o migraines   Hyperlipidemia    Hypothyroidism    Obesity    Thyroid disease    Type 2 diabetes mellitus ([place])    manages with diet and exercise    Social History   Socioeconomic History   Marital status: Widowed    Spouse name: Not on file   Number of children: Not on file   Years of education: Not on file   Highest education level: Not on file  Occupational History   Not on file  Tobacco Use    Smoking status: Never   Smokeless tobacco: Never  Vaping Use   Vaping Use: Never used  Substance and Sexual Activity   Alcohol use: Yes    Comment: occasional wine   Drug use: No   Sexual activity: Yes  Other Topics Concern   Not on file  Social History Narrative   Widower.   Step children.   Environmental health practitioner.   Enjoys riding hot air balloons, spending time with friends, reading, jewelry   Social Determinants of Health   Financial Resource Strain: Low Risk  (12/02/2022)   Overall Financial Resource Strain (CARDIA)    Difficulty of Paying Living Expenses: Not hard at all  Food Insecurity: No Food Insecurity (12/02/2022)   Hunger Vital Sign    Worried About Running Out of Food in the Last Year: Never true    Ran Out of Food in the Last Year: Never true  Transportation Needs: No Transportation Needs (12/02/2022)   PRAPARE - Hydrologist (Medical): No    Lack of Transportation (Non-Medical): No  Physical Activity: Insufficiently Active (12/02/2022)   Exercise Vital Sign    Days of Exercise per Week: 5 days    Minutes of Exercise per Session: 20 min  Stress: No Stress Concern Present (12/02/2022)   [place] -  Occupational Stress Questionnaire    Feeling of Stress : Not at all  Social Connections: Socially Isolated (12/02/2022)   Social Connection and Isolation Panel [NHANES]    Frequency of Communication with Friends and Family: More than three times a week    Frequency of Social Gatherings with Friends and Family: Once a week    Attends Religious Services: Never    Marine scientist or Organizations: No    Attends Archivist Meetings: Never    Marital Status: Widowed  Intimate Partner Violence: Not At Risk (12/02/2022)   Humiliation, Afraid, Rape, and Kick questionnaire    Fear of Current or Ex-Partner: No    Emotionally Abused: No    Physically Abused: No    Sexually Abused: No    Past  Surgical History:  Procedure Laterality Date   AXILLARY SENTINEL NODE BIOPSY Right 06/17/2022   Procedure: AXILLARY SENTINEL NODE BIOPSY;  Surgeon: Robert Bellow, MD;  Location: [facility] ORS;  Service: General;  Laterality: Right;   BREAST BIOPSY  12/28/2008   BREAST BIOPSY Right 04/28/2022   [place] core bx 11:00 coil clip -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   BREAST LUMPECTOMY WITH NEEDLE LOCALIZATION Bilateral 06/17/2022   Procedure: BREAST LUMPECTOMY WITH NEEDLE LOCALIZATION;  Surgeon: Robert Bellow, MD;  Location: [facility] OR[facility];  Service: General;  Laterality: Bilateral;   CARDIAC CATHETERIZATION  12/28/1996   and a glaucoma procedure as well that has a metal shunt in left eye   COLONOSCOPY  10/30/2004   COLONOSCOPY WITH PROPOFOL N/A 04/01/2016   Procedure: COLONOSCOPY WITH PROPOFOL;  Surgeon: Robert Bellow, MD;  Location: [facility];  Service: Endoscopy;  Laterality: N/A;   EYE SURGERY Bilateral    cataract sx   TONSILLECTOMY     WISDOM TOOTH EXTRACTION  1983    Family History  Problem Relation Age of Onset   Stroke Mother    Heart disease Father    Lung cancer Maternal Grandmother    Stomach cancer Paternal Grandmother    Breast cancer Neg Hx     Allergies  Allergen Reactions   Ciprofloxacin Swelling    At the injection site   Trulicity [Dulaglutide]     Itch at inj site   Antihistamines, Chlorpheniramine-Type Palpitations    Current Outpatient Medications on File Prior to Visit  Medication Sig Dispense Refill   anastrozole (ARIMIDEX) 1 MG tablet Take 1 tablet (1 mg total) by mouth daily. 90 tablet 1   b complex vitamins tablet Take 1 tablet by mouth daily.     benzonatate (TESSALON) 200 MG capsule Take 1 capsule (200 mg total) by mouth 3 (three) times daily as needed for cough. 15 capsule 0   BIOTIN PO Take 5 mg by mouth  daily.     Cholecalciferol 50 MCG (2000 UT) CAPS Take 1 capsule by mouth daily after lunch.     Continuous Blood Gluc Sensor (DEXCOM G7 SENSOR) MISC Apply every 10 days to check blood sugars. (Patient not taking: Reported on 01/20/2023) 3 each 3   famotidine (PEPCID) 20 MG tablet Take 20 mg by mouth at bedtime.     Fish Oil-Krill Oil (KRILL OIL PLUS PO) Take 1 tablet by mouth daily at 6 (six) AM.     fluticasone (FLONASE) 50 MCG/ACT nasal spray Place into both nostrils daily as needed for allergies or rhinitis.  glipiZIDE (GLUCOTROL) 5 MG tablet TAKE 1 TABLET BY MOUTH TWICE DAILY BEFORE A MEAL FOR DIABETES 180 tablet 0   Insulin Glargine (BASAGLAR KWIKPEN) 100 UNIT/ML INJECT 32 UNITS SUBCUTANEOUSLY ONCE DAILY FOR DIABETES 30 mL 0   Insulin Pen Needle (B-D ULTRAFINE III SHORT PEN) 31G X 8 MM MISC USE WITH INSULIN NIGHTLY 100 each 1   latanoprost (XALATAN) 0.005 % ophthalmic solution Place 1 drop into both eyes every morning.     levothyroxine (SYNTHROID) 100 MCG tablet TAKE 1 TABLET BY MOUTH IN THE MORNING ON  AN  EMPTY  STOMACH  WITH  WATER  ONLY  (NO  FOOD  OR  OTHER  MEDICATIONS  FOR  30  MINUTES) 90 tablet 0   loperamide (IMODIUM A-D) 2 MG tablet Take 1 tablet (2 mg total) by mouth 4 (four) times daily as needed for diarrhea or loose stools. 30 tablet 0   Magnesium 200 MG TABS Take 1 tablet by mouth at bedtime.     metFORMIN (GLUCOPHAGE-XR) 500 MG 24 hr tablet TAKE 2 TABLETS BY MOUTH TWICE DAILY WITH A MEAL FOR DIABETES 360 tablet 0   rosuvastatin (CRESTOR) 10 MG tablet TAKE 1 TABLET BY MOUTH ONCE DAILY FOR CHOLESTEROL (Patient taking differently: Take 10 mg by mouth at bedtime. TAKE 1 TABLET BY MOUTH ONCE DAILY FOR CHOLESTEROL) 90 tablet 3   tirzepatide (MOUNJARO) 2.5 MG/0.5ML Pen Inject 2.5 mg into the skin once a week. for diabetes. 2 mL 0   VITAMIN A PO Take 2,400 mcg by mouth daily at 6 (six) AM.     No current facility-administered medications on file prior to visit.    BP 136/82    Pulse 85   Temp 97.9 F (36.6 C) (Temporal)   Ht 5\' 1"  (1.549 m)   Wt 206 lb (93.4 kg)   SpO2 99%   BMI 38.92 kg/m  Objective:   Physical Exam Constitutional:      General: She is not in acute distress. Cardiovascular:     Rate and Rhythm: Normal rate.  Pulmonary:     Effort: Pulmonary effort is normal.  Abdominal:     General: Bowel sounds are normal.     Palpations: Abdomen is soft.     Tenderness: There is no abdominal tenderness.           Assessment & Plan:  Acute diarrhea Assessment & Plan: Likely viral given recent travel.  Checking CBC with diff and BMP today.  Checking stool studies.  Discussed the importance of hydration and to advance diet slowly. Remain off Imodium for now.  Await results.   Orders: -     CBC with Differential/Platelet -     Basic metabolic panel -     Giardia antigen -     Gastrointestinal Pathogen Pnl RT, PCR -     C. difficile GDH and Toxin A/B        Pleas Koch, NP

## 2023-03-23 ENCOUNTER — Other Ambulatory Visit: Payer: Self-pay | Admitting: Radiology

## 2023-03-23 DIAGNOSIS — R197 Diarrhea, unspecified: Secondary | ICD-10-CM

## 2023-03-24 LAB — C. DIFFICILE GDH AND TOXIN A/B
GDH ANTIGEN: NOT DETECTED
MICRO NUMBER:: 14743122
SPECIMEN QUALITY:: ADEQUATE
TOXIN A AND B: NOT DETECTED

## 2023-03-24 LAB — GIARDIA ANTIGEN
MICRO NUMBER:: 14743053
RESULT:: NOT DETECTED
SPECIMEN QUALITY:: ADEQUATE

## 2023-03-25 ENCOUNTER — Other Ambulatory Visit: Payer: Self-pay | Admitting: General Surgery

## 2023-03-25 DIAGNOSIS — E1165 Type 2 diabetes mellitus with hyperglycemia: Secondary | ICD-10-CM

## 2023-03-25 DIAGNOSIS — Z853 Personal history of malignant neoplasm of breast: Secondary | ICD-10-CM

## 2023-03-26 LAB — GASTROINTESTINAL PATHOGEN PNL

## 2023-03-31 ENCOUNTER — Encounter: Payer: Self-pay | Admitting: Internal Medicine

## 2023-03-31 ENCOUNTER — Inpatient Hospital Stay: Payer: Medicare HMO | Attending: Internal Medicine | Admitting: Internal Medicine

## 2023-03-31 VITALS — BP 143/79 | HR 91 | Temp 99.2°F | Resp 16 | Ht 61.0 in | Wt 203.0 lb

## 2023-03-31 DIAGNOSIS — Z79899 Other long term (current) drug therapy: Secondary | ICD-10-CM | POA: Insufficient documentation

## 2023-03-31 DIAGNOSIS — Z79811 Long term (current) use of aromatase inhibitors: Secondary | ICD-10-CM | POA: Diagnosis not present

## 2023-03-31 DIAGNOSIS — Z170 Estrogen receptor positive status [ER+]: Secondary | ICD-10-CM | POA: Insufficient documentation

## 2023-03-31 DIAGNOSIS — E669 Obesity, unspecified: Secondary | ICD-10-CM | POA: Diagnosis not present

## 2023-03-31 DIAGNOSIS — R232 Flushing: Secondary | ICD-10-CM | POA: Diagnosis not present

## 2023-03-31 DIAGNOSIS — R5383 Other fatigue: Secondary | ICD-10-CM | POA: Insufficient documentation

## 2023-03-31 DIAGNOSIS — N951 Menopausal and female climacteric states: Secondary | ICD-10-CM | POA: Diagnosis not present

## 2023-03-31 DIAGNOSIS — Z794 Long term (current) use of insulin: Secondary | ICD-10-CM | POA: Diagnosis not present

## 2023-03-31 DIAGNOSIS — M8580 Other specified disorders of bone density and structure, unspecified site: Secondary | ICD-10-CM | POA: Diagnosis not present

## 2023-03-31 DIAGNOSIS — D0512 Intraductal carcinoma in situ of left breast: Secondary | ICD-10-CM | POA: Diagnosis not present

## 2023-03-31 DIAGNOSIS — Z7984 Long term (current) use of oral hypoglycemic drugs: Secondary | ICD-10-CM | POA: Diagnosis not present

## 2023-03-31 DIAGNOSIS — G4700 Insomnia, unspecified: Secondary | ICD-10-CM | POA: Insufficient documentation

## 2023-03-31 DIAGNOSIS — C50411 Malignant neoplasm of upper-outer quadrant of right female breast: Secondary | ICD-10-CM | POA: Diagnosis not present

## 2023-03-31 NOTE — Assessment & Plan Note (Addendum)
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 PN=0; G-2. ?  Focal involvement of the margin by DCIS; invasive carcinoma negative margins. Oncotype-RS =8; low risk.  S/p radiation.   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   #Patient started on Anastrazole [since Nov 1st 2023]-patient tolerating well except for mild hot flashes/fatigue/insomnia see below.  Discussed patient has other options-taking a break from anastrozole for a month or so and also alternate AI.  However at this time it was mutually discussed to continue anastrozole; and try interventions below.  # Fatigue/insomnia-?  Related to anastrozole.  Recommend care program.  And also melatonin 1 to 2 mg at nighttime as needed.  # Hot flashes- from anastrozole G-1 monitor now. Stable.   # Osteopenia: BMD March 2023- T-score of -1.9.  Continue On ca+vit D. Stable.   # Obesity-  importance of healthy weight/and weight loss.[NOOM] -Stable.   # DISPOSITION: # referral to [facility] re: care program/ re: fatigue # follow up in 3  months  MD; no labs-Dr.B  [place], [place].

## 2023-03-31 NOTE — Progress Notes (Signed)
Survivorship Care Plan visit completed.  Treatment summary reviewed and given to patient.  [facility] answers booklet reviewed and given to patient.  CARE program and Cancer Transitions discussed with patient along with other resources cancer center offers to patients and caregivers.  Patient verbalized understanding.    

## 2023-03-31 NOTE — Progress Notes (Signed)
[place] NOTE  Patient Care Team: Pleas Koch, NP as PCP - General (Internal Medicine) Ammie Dalton, Okey Regal, MD as Consulting Physician (Obstetrics and Gynecology) Bary Castilla, Forest Gleason, MD (General Surgery) Kennith Center, RD as Dietitian (Family Medicine) Theodore Demark, RN (Inactive) as Oncology Nurse Navigator Daiva Huge, RN as Oncology Nurse Navigator Cammie Sickle, MD as Consulting Physician (Oncology) Noreene Filbert, MD as Consulting Physician (Radiation Oncology)  CHIEF COMPLAINTS/PURPOSE OF CONSULTATION: Breast cancer  #  Oncology History Overview Note  IMPRESSION: 1. 1.3 cm irregular mass with associated distortion in the right breast at 11 o'clock, 2 cm the nipple, highly suspicious for malignancy. No abnormal right axillary lymph nodes.   RECOMMENDATION: 1. Ultrasound-guided core needle biopsy of the right breast highly suspicious mass. A.  BREAST MASS, RIGHT 11:00 2 CM FN; ULTRASOUND-GUIDED BIOPSY:  - INVASIVE MAMMARY CARCINOMA WITH LOBULAR FEATURES.   Size of invasive carcinoma: 8 mm in this sample  Histologic grade of invasive carcinoma: Grade 2                       Glandular/tubular differentiation score: 3                       Nuclear pleomorphism score: 2                       Mitotic rate score: 1                       Total score: 6  Ductal carcinoma in situ: Present, intermediate grade  Lymphovascular invasion: Not identified   ER/PR/HER2: Immunohistochemistry will be performed on block A2, with  reflex to [place] for HER2 2+. The results will be reported in an addendum.   Comment:  The definitive grade will be assigned on the excisional specimen.   GROSS DESCRIPTION:  A. Labeled: Right breast 11:00 2 cm from the nipple  Received: Formalin  Time/date in fixative: Collected and placed in formalin at 8:30 AM on  04/28/2022  Cold ischemic time: Less than 1 minute  Total fixation time: Approximately 9 hours  Core  pieces: 4  Size: Ranges from 0.5-2.0 cm in length and 0.2 cm in diameter  Description: Tan cores of fibrofatty tissue  Ink color: Black  Entirely submitted in 2 cassettes with 2 cores in A1 and 2 cores in A2.   CM 04/28/2022   Final Diagnosis performed by Quay Burow, MD.   Electronically signed  04/29/2022 1:04:41PM  The electronic signature indicates that the named Attending Pathologist  has evaluated the specimen  Technical component performed at [facility], [address] [place],  [place] [address] Lab: [phone] Dir: Rush Farmer, MD, MMM   Professional component performed at [facility], [facility], [place], [place],  [address] Lab: [phone]  Dir: Kathi Simpers, MD   ADDENDUM:  CASE SUMMARY: BREAST BIOMARKER TESTS  Estrogen Receptor (ER) Status: POSITIVE          Percentage of cells with nuclear positivity: Greater than 90%          Average intensity of staining: Strong   Progesterone Receptor (PgR) Status: POSITIVE          Percentage of cells with nuclear positivity: Greater than 90%          Average intensity of staining: Strong   HER2 (by immunohistochemistry): NEGATIVE (Score  0)  Ki-67: Not performed   #April 2023 clinical stage I -ER/PR positive HER2 =0 invasive mammary carcinoma with lobular features.  [Dr.Byrnett] May 2023 breast MRI-right side lesion-measuring 3.4 x 1.7 x 2.2 centimeters.     #May 2023 breast MRI showed-left breast indeterminate oval 1.5 centimeter mass in the UPPER-OUTER QUADRANT LEFT.  S/p biopsy positive for ER/PR positive DCIS. S/p lumpectomy  # Oncotype recurrence score 8-low risk; no chemotherapy  # Bilateral radiation-finished mid September 2023;   # Mid-OCT 2023- START Anastrazole      Carcinoma of upper-outer quadrant of right breast in female, estrogen receptor positive  05/06/2022 Initial Diagnosis   Carcinoma of upper-outer quadrant of right breast in female, estrogen receptor positive ([place])    05/06/2022 Cancer Staging   Staging form: Breast, AJCC 8th Edition - Clinical: Stage IA (cT1c, cN0, cM0, G2, ER+, PR+, HER2-) - Signed by Cammie Sickle, MD on 05/06/2022 Histologic grading system: 3 grade system    HISTORY OF PRESENTING ILLNESS: Valerie Gordon 72 y.o.  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 Patient currently on anastrozole.   Patient is having trouble sleeping and has not taken anything because she is unsure of what she can take.  Lack of energy.    Patient went on a cruise and contracted a "stomach virus" that has cause loose stools for 3 weeks.  Patient followed up with PCP.  Patient's currently symptoms are improved.  Review of Systems  Constitutional:  Negative for chills, diaphoresis, fever, malaise/fatigue and weight loss.  HENT:  Negative for nosebleeds and sore throat.   Eyes:  Negative for double vision.  Respiratory:  Negative for cough, hemoptysis, sputum production, shortness of breath and wheezing.   Cardiovascular:  Negative for chest pain, palpitations, orthopnea and leg swelling.  Gastrointestinal:  Negative for abdominal pain, blood in stool, constipation, diarrhea, heartburn, melena, nausea and vomiting.  Genitourinary:  Negative for dysuria, frequency and urgency.  Musculoskeletal:  Negative for back pain and joint pain.  Skin: Negative.  Negative for itching and rash.  Neurological:  Negative for dizziness, tingling, focal weakness, weakness and headaches.  Endo/Heme/Allergies:  Does not bruise/bleed easily.  Psychiatric/Behavioral:  Negative for depression. The patient is not nervous/anxious and does not have insomnia.      MEDICAL HISTORY:  Past Medical History:  Diagnosis Date   Arthritis    lumbar spine   Breast cancer    Cataract    COVID-19 virus  infection 10/14/2021   Depression    Gallstones    asyptomatic   GERD (gastroesophageal reflux disease)    Glaucoma    Headache    h/o migraines   Hyperlipidemia    Hypothyroidism    Obesity    Thyroid disease    Type 2 diabetes mellitus    manages with diet and exercise    SURGICAL HISTORY: Past Surgical History:  Procedure Laterality Date   AXILLARY SENTINEL NODE BIOPSY Right 06/17/2022   Procedure: AXILLARY SENTINEL NODE BIOPSY;  Surgeon: Robert Bellow, MD;  Location: [facility] ORS;  Service: General;  Laterality: Right;   BREAST BIOPSY  12/28/2008   BREAST BIOPSY Right 04/28/2022   [place] core bx 11:00 coil clip -GRADE II INVASIVE MAMMARY CARCINOMA WITH LOBULAR   BREAST BIOPSY Left 05/22/2022   MR guided bx-DUCTAL CARCINOMA IN SITU WITH SOLID, CRIBRIFORM AND MICROPAPILLARY  GROWTH PATTERN.   BREAST LUMPECTOMY WITH NEEDLE LOCALIZATION Bilateral 06/17/2022   Procedure: BREAST LUMPECTOMY WITH NEEDLE LOCALIZATION;  Surgeon: Robert Bellow, MD;  Location: [facility] ORS;  Service: General;  Laterality: Bilateral;   CARDIAC CATHETERIZATION  12/28/1996   and a glaucoma procedure as well that has a metal shunt in left eye   COLONOSCOPY  10/30/2004   COLONOSCOPY WITH PROPOFOL N/A 04/01/2016   Procedure: COLONOSCOPY WITH PROPOFOL;  Surgeon: Robert Bellow, MD;  Location: [facility] ENDOSCOPY;  Service: Endoscopy;  Laterality: N/A;   EYE SURGERY Bilateral    cataract sx   TONSILLECTOMY     WISDOM TOOTH EXTRACTION  1983    SOCIAL HISTORY: Social History   Socioeconomic History   Marital status: Widowed    Spouse name: Not on file   Number of children: Not on file   Years of education: Not on file   Highest education level: Not on file  Occupational History   Not on file  Tobacco Use   Smoking status: Never   Smokeless tobacco: Never  Vaping Use   Vaping Use: Never used  Substance and Sexual Activity   Alcohol use: Yes    Comment: occasional wine   Drug use: No   Sexual  activity: Yes  Other Topics Concern   Not on file  Social History Narrative   Widower.   Step children.   Environmental health practitioner.   Enjoys riding hot air balloons, spending time with friends, reading, jewelry   Social Determinants of Health   Financial Resource Strain: Low Risk  (12/02/2022)   Overall Financial Resource Strain (CARDIA)    Difficulty of Paying Living Expenses: Not hard at all  Food Insecurity: No Food Insecurity (12/02/2022)   Hunger Vital Sign    Worried About Running Out of Food in the Last Year: Never true    Ran Out of Food in the Last Year: Never true  Transportation Needs: No Transportation Needs (12/02/2022)   PRAPARE - Hydrologist (Medical): No    Lack of Transportation (Non-Medical): No  Physical Activity: Insufficiently Active (12/02/2022)   Exercise Vital Sign    Days of Exercise per Week: 5 days    Minutes of Exercise per Session: 20 min  Stress: No Stress Concern Present (12/02/2022)   [place]    Feeling of Stress : Not at all  Social Connections: Socially Isolated (12/02/2022)   Social Connection and Isolation Panel [NHANES]    Frequency of Communication with Friends and Family: More than three times a week    Frequency of Social Gatherings with Friends and Family: Once a week    Attends Religious Services: Never    Marine scientist or Organizations: No    Attends Archivist Meetings: Never    Marital Status: Widowed  Intimate Partner Violence: Not At Risk (12/02/2022)   Humiliation, Afraid, Rape, and Kick questionnaire    Fear of Current or Ex-Partner: No    Emotionally Abused: No    Physically Abused: No    Sexually Abused: No    FAMILY HISTORY: Family History  Problem Relation Age of Onset   Stroke Mother    Heart disease Father    Lung cancer Maternal Grandmother    Stomach cancer Paternal Grandmother    Breast cancer  Neg Hx     ALLERGIES:  is allergic to ciprofloxacin; trulicity [dulaglutide]; and antihistamines, chlorpheniramine-type.  MEDICATIONS:  Current Outpatient Medications  Medication Sig Dispense Refill   anastrozole (ARIMIDEX) 1 MG tablet Take 1 tablet (1 mg total) by mouth daily. 90 tablet 1   b complex vitamins tablet Take 1 tablet by mouth daily.     benzonatate (TESSALON) 200 MG capsule Take 1 capsule (200 mg total) by mouth 3 (three) times daily as needed for cough. 15 capsule 0   BIOTIN PO Take 5 mg by mouth daily.     Cholecalciferol 50 MCG (2000 UT) CAPS Take 1 capsule by mouth daily after lunch.     Continuous Blood Gluc Sensor (DEXCOM G7 SENSOR) MISC Apply every 10 days to check blood sugars. 3 each 3   famotidine (PEPCID) 20 MG tablet Take 20 mg by mouth at bedtime.     Fish Oil-Krill Oil (KRILL OIL PLUS PO) Take 1 tablet by mouth daily at 6 (six) AM.     fluticasone (FLONASE) 50 MCG/ACT nasal spray Place into both nostrils daily as needed for allergies or rhinitis.     glipiZIDE (GLUCOTROL) 5 MG tablet TAKE 1 TABLET BY MOUTH TWICE DAILY BEFORE A MEAL FOR DIABETES 180 tablet 0   Insulin Glargine (BASAGLAR KWIKPEN) 100 UNIT/ML INJECT 32 UNITS SUBCUTANEOUSLY ONCE DAILY FOR DIABETES 30 mL 0   Insulin Pen Needle (B-D ULTRAFINE III SHORT PEN) 31G X 8 MM MISC USE WITH INSULIN NIGHTLY 100 each 1   latanoprost (XALATAN) 0.005 % ophthalmic solution Place 1 drop into both eyes every morning.     levothyroxine (SYNTHROID) 100 MCG tablet TAKE 1 TABLET BY MOUTH IN THE MORNING ON  AN  EMPTY  STOMACH  WITH  WATER  ONLY  (NO  FOOD  OR  OTHER  MEDICATIONS  FOR  30  MINUTES) 90 tablet 0   loperamide (IMODIUM A-D) 2 MG tablet Take 1 tablet (2 mg total) by mouth 4 (four) times daily as needed for diarrhea or loose stools. 30 tablet 0   Magnesium 200 MG TABS Take 1 tablet by mouth at bedtime.     metFORMIN (GLUCOPHAGE-XR) 500 MG 24 hr tablet TAKE 2 TABLETS BY MOUTH TWICE DAILY WITH A MEAL FOR DIABETES 360  tablet 0   rosuvastatin (CRESTOR) 10 MG tablet TAKE 1 TABLET BY MOUTH ONCE DAILY FOR CHOLESTEROL (Patient taking differently: Take 10 mg by mouth at bedtime. TAKE 1 TABLET BY MOUTH ONCE DAILY FOR CHOLESTEROL) 90 tablet 3   tirzepatide (MOUNJARO) 2.5 MG/0.5ML Pen Inject 2.5 mg into the skin once a week. for diabetes. 2 mL 0   VITAMIN A PO Take 2,400 mcg by mouth daily at 6 (six) AM.     No current facility-administered medications for this visit.      Marland Kitchen  PHYSICAL EXAMINATION: ECOG PERFORMANCE STATUS: 0 - Asymptomatic  Vitals:   03/31/23 1517  BP: (!) 143/79  Pulse: 91  Resp: 16  Temp: 99.2 F (37.3 C)  SpO2: 96%   Filed Weights   03/31/23 1517  Weight: 203 lb (92.1 kg)    Physical Exam Vitals and nursing note reviewed.  HENT:     Head: Normocephalic and atraumatic.     Mouth/Throat:     Pharynx: Oropharynx is clear.  Eyes:     Extraocular Movements: Extraocular movements intact.     Pupils: Pupils are equal, round, and reactive to light.  Cardiovascular:     Rate and Rhythm: Normal rate and regular rhythm.  Abdominal:     Palpations: Abdomen is soft.  Musculoskeletal:  General: Normal range of motion.     Cervical back: Normal range of motion.  Skin:    General: Skin is warm.  Neurological:     General: No focal deficit present.     Mental Status: She is alert and oriented to person, place, and time.  Psychiatric:        Behavior: Behavior normal.        Judgment: Judgment normal.      LABORATORY DATA:  I have reviewed the data as listed Lab Results  Component Value Date   WBC 7.5 03/19/2023   HGB 13.6 03/19/2023   HCT 40.0 03/19/2023   MCV 92.8 03/19/2023   PLT 281.0 03/19/2023   Recent Labs    06/12/22 1441 01/20/23 1530 03/19/23 1138  NA 137 134* 134*  K 4.1 4.2 4.5  CL 103 98 99  CO2 24 23 28   GLUCOSE 127* 145* 237*  BUN 14 9 10   CREATININE 1.14* 0.90 0.93  CALCIUM 10.0 10.2 10.1  GFRNONAA 52*  --   --   PROT  --  7.1  --    ALBUMIN  --  4.4  --   AST  --  34  --   ALT  --  14  --   ALKPHOS  --  73  --   BILITOT  --  0.3  --     RADIOGRAPHIC STUDIES: I have personally reviewed the radiological images as listed and agreed with the findings in the report. No results found.  ASSESSMENT & PLAN:   Carcinoma of upper-outer quadrant of right breast in female, estrogen receptor positive ([place]) # Right breast cancer stage I [id number]-INVASIVE MAMMARY CARCINOMA WITH DUCTAL AND LOBULAR FEATURES, 14 MM. PN=0; G-2. ?  Focal involvement of the margin by DCIS; invasive carcinoma negative margins. Oncotype-RS =8; low risk.  S/p radiation.   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   #Patient started on Anastrazole [since Nov 1st 2023]-patient tolerating well except for mild hot flashes/fatigue/insomnia see below.  Discussed patient has other options-taking a break from anastrozole for a month or so and also alternate AI.  However at this time it was mutually discussed to continue anastrozole; and try interventions below.  # Fatigue/insomnia-?  Related to anastrozole.  Recommend care program.  And also melatonin 1 to 2 mg at nighttime as needed.  # Hot flashes- from anastrozole G-1 monitor now. Stable.   # Osteopenia: BMD March 2023- T-score of -1.9.  Continue On ca+vit D. Stable.   # Obesity-  importance of healthy weight/and weight loss.[NOOM] -Stable.   # DISPOSITION: # referral to [facility] re: care program/ re: fatigue # follow up in 3  months  MD; no labs-Dr.B  [place], [place].     All questions were answered. The patient/family knows to call the clinic with any problems, questions or concerns.       Cammie Sickle, MD 03/31/2023 3:56 PM

## 2023-03-31 NOTE — Progress Notes (Signed)
Patient is having trouble sleeping and has not taken anything because she is unsure of what she can take.   Concerned with lack of energy.   Patient went on a cruise and contracted a "stomach virus" that has cause loose stools for 3 weeks, she is still having issues today.

## 2023-03-31 NOTE — Addendum Note (Signed)
Addended by: Sofie Rower A on: 03/31/2023 04:39 PM   Modules accepted: Orders

## 2023-04-13 ENCOUNTER — Encounter: Payer: Medicare HMO | Attending: Internal Medicine

## 2023-04-13 VITALS — Ht 61.8 in | Wt 203.3 lb

## 2023-04-13 DIAGNOSIS — Z170 Estrogen receptor positive status [ER+]: Secondary | ICD-10-CM

## 2023-04-13 NOTE — Progress Notes (Signed)
Daily Session Note  Patient Details  Name: TEDDIE HELVEY MRN: [id number] Date of Birth: 1951-12-26 Referring Provider:    Encounter Date: 04/13/2023  Check In:    Session Check In - 04/13/23 1352       Check-In   Supervising physician immediately available to respond to emergencies See telemetry face sheet for immediately available ER MD    Location [facility]    Staff Present Salley Scarlet, MS, ACSM CEP, Exercise Physiologist;Jessica Juanetta Gosling, MA, RCEP, CCRP, CCET    Virtual Visit No    Medication changes reported     No    Fall or balance concerns reported    No    Warm-up and Cool-down Not performed (comment)   and Gym orientation   Resistance Training Performed No    VAD Patient? No    PAD/SET Patient? No              6 Minute Walk     Row Name 04/13/23 1401         6 Minute Walk   Phase Initial     Distance 930 feet     Walk Time 5.5 minutes     # of Rest Breaks 2  2:52-3:10; 4:56-5:10     MPH 1.76     METS 1.84     RPE 15     Perceived Dyspnea  2     VO2 Peak 6.44     Symptoms Yes (comment)     Comments Bilateral hip pain 8/10, slight SOB     Resting HR 76 bpm     Resting BP 104/66     Resting Oxygen Saturation  96 %     Exercise Oxygen Saturation  during 6 min walk 97 %     Max Ex. HR 112 bpm     Max Ex. BP 132/70     2 Minute Post BP 112/66                Exercise Prescription Changes - 04/13/23 1400       Response to Exercise   Blood Pressure (Admit) 104/66    Blood Pressure (Exercise) 132/70    Blood Pressure (Exit) 112/66    Heart Rate (Admit) 76 bpm    Heart Rate (Exercise) 112 bpm    Heart Rate (Exit) 77 bpm    Oxygen Saturation (Admit) 96 %    Oxygen Saturation (Exercise) 97 %    Oxygen Saturation (Exit) 96 %    Rating of Perceived Exertion (Exercise) 15    Perceived Dyspnea (Exercise) 2    Symptoms Bilateral hip pain 8/10, slight SOB    Comments walk test results             Social History    Tobacco Use  Smoking Status Never  Smokeless Tobacco Never    Goals Met:  Proper associated with RPD/PD & O2 Sat Independence with exercise equipment Exercise tolerated well Personal goals reviewed No report of concerns or symptoms today  Goals Unmet:  Not Applicable  Comments: First full day of orientation. All starting workloads were established based on the results of the 6 minute walk test done at initial orientation visit.  The plan for exercise progression was also introduced and progression will be customized based on patient's performance and goals.    Dr. Bethann Punches is Medical Director for [facility].  Dr. Vida Rigger is Medical Director for [facility].

## 2023-04-14 ENCOUNTER — Other Ambulatory Visit: Payer: Self-pay | Admitting: Primary Care

## 2023-04-14 DIAGNOSIS — E785 Hyperlipidemia, unspecified: Secondary | ICD-10-CM

## 2023-04-15 DIAGNOSIS — Z170 Estrogen receptor positive status [ER+]: Secondary | ICD-10-CM

## 2023-04-15 LAB — GLUCOSE, CAPILLARY
Glucose-Capillary: 128 mg/dL — ABNORMAL HIGH (ref 70–99)
Glucose-Capillary: 77 mg/dL (ref 70–99)
Glucose-Capillary: 85 mg/dL (ref 70–99)

## 2023-04-15 MED ORDER — TIRZEPATIDE 5 MG/0.5ML ~~LOC~~ SOAJ: 5.0000 mg | SUBCUTANEOUS | 0 refills | Status: DC

## 2023-04-15 NOTE — Progress Notes (Signed)
Daily Session Note  Patient Details  Name: Valerie Gordon MRN: [id number] Date of Birth: 10-Jan-1951 Referring Provider:    Encounter Date: 04/15/2023  Check In:  Session Check In - 04/15/23 1232       Check-In   Supervising physician immediately available to respond to emergencies See telemetry face sheet for immediately available ER MD    Location [facility]    Staff Present Ronette Deter, BS, Exercise Physiologist;Kara Clinton Sawyer, MS, ACSM CEP, Exercise Physiologist    Virtual Visit No    Medication changes reported     No    Fall or balance concerns reported    No    Warm-up and Cool-down Performed on first and last piece of equipment    Resistance Training Performed Yes    VAD Patient? No    PAD/SET Patient? No      Pain Assessment   Currently in Pain? No/denies    Multiple Pain Sites No                Social History   Tobacco Use  Smoking Status Never  Smokeless Tobacco Never    Goals Met:  Independence with exercise equipment Exercise tolerated well No report of concerns or symptoms today  Goals Unmet:  Not Applicable  Comments: First full day of exercise!  Patient was oriented to gym and equipment including functions, settings, policies, and procedures.  Patient's individual exercise prescription and treatment plan were reviewed.  All starting workloads were established based on the results of the 6 minute walk test done at initial orientation visit.  The plan for exercise progression was also introduced and progression will be customized based on patient's performance and goals.    Dr. Bethann Punches is Medical Director for [facility].  Dr. Vida Rigger is Medical Director for [facility].

## 2023-04-21 ENCOUNTER — Ambulatory Visit: Payer: Medicare HMO | Admitting: Primary Care

## 2023-04-22 DIAGNOSIS — Z170 Estrogen receptor positive status [ER+]: Secondary | ICD-10-CM

## 2023-04-22 LAB — GLUCOSE, CAPILLARY
Glucose-Capillary: 182 mg/dL — ABNORMAL HIGH (ref 70–99)
Glucose-Capillary: 142 mg/dL — ABNORMAL HIGH (ref 70–99)

## 2023-04-22 NOTE — Progress Notes (Signed)
Daily Session Note  Patient Details  Name: CAROLJEAN MONSIVAIS MRN: [id number] Date of Birth: 02-24-1951 Referring Provider:    Encounter Date: 04/22/2023  Check In:  Session Check In - 04/22/23 1244       Check-In   Supervising physician immediately available to respond to emergencies See telemetry face sheet for immediately available ER MD    Location [facility]    Staff Present Ronette Deter, BS, Exercise Physiologist;Melissa Caiola MS, RDN, LDN    Virtual Visit No    Medication changes reported     No    Fall or balance concerns reported    No    Warm-up and Cool-down Performed on first and last piece of equipment    Resistance Training Performed Yes    VAD Patient? No    PAD/SET Patient? No      Pain Assessment   Currently in Pain? No/denies    Multiple Pain Sites No                Social History   Tobacco Use  Smoking Status Never  Smokeless Tobacco Never    Goals Met:  Independence with exercise equipment Exercise tolerated well No report of concerns or symptoms today  Goals Unmet:  Not Applicable  Comments: Pt able to follow exercise prescription today without complaint.  Will continue to monitor for progression.    Dr. Bethann Punches is Medical Director for [facility].  Dr. Vida Rigger is Medical Director for [facility].

## 2023-04-23 ENCOUNTER — Ambulatory Visit (INDEPENDENT_AMBULATORY_CARE_PROVIDER_SITE_OTHER): Payer: Medicare HMO | Admitting: Primary Care

## 2023-04-23 ENCOUNTER — Encounter: Payer: Self-pay | Admitting: Primary Care

## 2023-04-23 VITALS — BP 116/76 | HR 70 | Temp 98.2°F | Ht 61.8 in | Wt 201.0 lb

## 2023-04-23 DIAGNOSIS — Z794 Long term (current) use of insulin: Secondary | ICD-10-CM

## 2023-04-23 DIAGNOSIS — E1165 Type 2 diabetes mellitus with hyperglycemia: Secondary | ICD-10-CM | POA: Diagnosis not present

## 2023-04-23 DIAGNOSIS — Z7985 Long-term (current) use of injectable non-insulin antidiabetic drugs: Secondary | ICD-10-CM

## 2023-04-23 DIAGNOSIS — E039 Hypothyroidism, unspecified: Secondary | ICD-10-CM | POA: Diagnosis not present

## 2023-04-23 LAB — POCT GLYCOSYLATED HEMOGLOBIN (HGB A1C): Hemoglobin A1C: 7.8 % — AB (ref 4.0–5.6)

## 2023-04-23 MED ORDER — LEVOTHYROXINE SODIUM 100 MCG PO TABS
ORAL_TABLET | ORAL | 2 refills | Status: DC
Start: 2023-04-23 — End: 2024-02-15

## 2023-04-23 NOTE — Progress Notes (Signed)
Subjective:    Patient ID: Valerie Gordon, female    DOB: 1951/09/14, 72 y.o.   MRN: [id number]  HPI  Valerie Gordon is a very pleasant 72 y.o. female with a history of type 2 diabetes, hypothyroidism, hyperlipidemia, breast cancer who presents today for follow up of diabetes.   Current medications include: Basaglar 32 units daily, Mounjaro 5 mg weekly. She will start her first dose of 5 mg of Mounjaro tomorrow.   Her metformin was discontinued several weeks ago given recurrent diarrhea. She is no longer taking Glipizide given hypoglycemia.   She has been tweaking her Basalgar on her own depending on glucose readings. She is injecting between 21-32 units on average.   She is checking her blood glucose 1 times daily and is getting readings of:  AM fasting: 170's   Last A1C: 8.3 in January 2024, 7.8 today. Last Eye Exam: UTD Last Foot Exam: UTD Pneumonia Vaccination: 2021 Urine Microalbumin: Due next month Statin: rosuvastatin   Dietary changes since last visit: None.   Exercise: Increased activity through the [facility].   BP Readings from Last 3 Encounters:  04/23/23 116/76  03/31/23 (!) 143/79  03/19/23 136/82    Wt Readings from Last 3 Encounters:  04/23/23 201 lb (91.2 kg)  04/13/23 203 lb 4.8 oz (92.2 kg)  03/31/23 203 lb (92.1 kg)      Review of Systems  Respiratory:  Negative for shortness of breath.   Cardiovascular:  Negative for chest pain.  Gastrointestinal:  Negative for diarrhea.  Endocrine: Negative for polydipsia and polyuria.  Neurological:  Negative for numbness.         Past Medical History:  Diagnosis Date   Arthritis    lumbar spine   Breast cancer (HCC)    Cataract    COVID-19 virus infection 10/14/2021   Depression    Gallstones    asyptomatic   GERD (gastroesophageal reflux disease)    Glaucoma    Headache    h/o migraines   Hyperlipidemia    Hypothyroidism    Obesity    Thyroid disease    Type 2 diabetes  mellitus (HCC)    manages with diet and exercise    Social History   Socioeconomic History   Marital status: Widowed    Spouse name: Not on file   Number of children: Not on file   Years of education: Not on file   Highest education level: Not on file  Occupational History   Not on file  Tobacco Use   Smoking status: Never   Smokeless tobacco: Never  Vaping Use   Vaping Use: Never used  Substance and Sexual Activity   Alcohol use: Yes    Comment: occasional wine   Drug use: No   Sexual activity: Yes  Other Topics Concern   Not on file  Social History Narrative   Widower.   Step children.   Librarian, academic.   Enjoys riding hot air balloons, spending time with friends, reading, jewelry   Social Determinants of Health   Financial Resource Strain: Low Risk  (12/02/2022)   Overall Financial Resource Strain (CARDIA)    Difficulty of Paying Living Expenses: Not hard at all  Food Insecurity: No Food Insecurity (12/02/2022)   Hunger Vital Sign    Worried About Running Out of Food in the Last Year: Never true    Ran Out of Food in the Last Year: Never true  Transportation Needs: No Transportation  Needs (12/02/2022)   PRAPARE - Administrator, Civil Service (Medical): No    Lack of Transportation (Non-Medical): No  Physical Activity: Insufficiently Active (12/02/2022)   Exercise Vital Sign    Days of Exercise per Week: 5 days    Minutes of Exercise per Session: 20 min  Stress: No Stress Concern Present (12/02/2022)   [employer] of Occupational Health - Occupational Stress Questionnaire    Feeling of Stress : Not at all  Social Connections: Socially Isolated (12/02/2022)   Social Connection and Isolation Panel [NHANES]    Frequency of Communication with Friends and Family: More than three times a week    Frequency of Social Gatherings with Friends and Family: Once a week    Attends Religious Services: Never    Database administrator or  Organizations: No    Attends Banker Meetings: Never    Marital Status: Widowed  Intimate Partner Violence: Not At Risk (12/02/2022)   Humiliation, Afraid, Rape, and Kick questionnaire    Fear of Current or Ex-Partner: No    Emotionally Abused: No    Physically Abused: No    Sexually Abused: No    Past Surgical History:  Procedure Laterality Date   AXILLARY SENTINEL NODE BIOPSY Right 06/17/2022   Procedure: AXILLARY SENTINEL NODE BIOPSY;  Surgeon: Earline Mayotte, MD;  Location: [facility] ORS;  Service: General;  Laterality: Right;   BREAST BIOPSY  12/28/2008   BREAST BIOPSY Right 04/28/2022   [place] core bx 11:00 coil clip -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   BREAST LUMPECTOMY WITH NEEDLE LOCALIZATION Bilateral 06/17/2022   Procedure: BREAST LUMPECTOMY WITH NEEDLE LOCALIZATION;  Surgeon: Earline Mayotte, MD;  Location: [facility] ORS;  Service: General;  Laterality: Bilateral;   CARDIAC CATHETERIZATION  12/28/1996   and a glaucoma procedure as well that has a metal shunt in left eye   COLONOSCOPY  10/30/2004   COLONOSCOPY WITH PROPOFOL N/A 04/01/2016   Procedure: COLONOSCOPY WITH PROPOFOL;  Surgeon: Earline Mayotte, MD;  Location: [facility] END[facility];  Service: Endoscopy;  Laterality: N/A;   EYE SURGERY Bilateral    cataract sx   TONSILLECTOMY     WISDOM TOOTH EXTRACTION  1983    Family History  Problem Relation Age of Onset   Stroke Mother    Heart disease Father    Lung cancer Maternal Grandmother    Stomach cancer Paternal Grandmother    Breast cancer Neg Hx     Allergies  Allergen Reactions   Ciprofloxacin Swelling    At the injection site   Trulicity [Dulaglutide]     Itch at inj site   Antihistamines, Chlorpheniramine-Type Palpitations    Current Outpatient Medications on File Prior to Visit  Medication Sig Dispense  Refill   anastrozole (ARIMIDEX) 1 MG tablet Take 1 tablet (1 mg total) by mouth daily. 90 tablet 1   b complex vitamins tablet Take 1 tablet by mouth daily.     BIOTIN PO Take 5 mg by mouth daily.     Cholecalciferol 50 MCG (2000 UT) CAPS Take 1 capsule by mouth daily after lunch.     famotidine (PEPCID) 20 MG tablet Take 20 mg by mouth at bedtime.     Fish Oil-Krill Oil (KRILL OIL PLUS PO) Take 1 tablet by mouth daily at 6 (six) AM.     fluticasone (  FLONASE) 50 MCG/ACT nasal spray Place into both nostrils daily as needed for allergies or rhinitis.     Insulin Glargine (BASAGLAR KWIKPEN) 100 UNIT/ML INJECT 32 UNITS SUBCUTANEOUSLY ONCE DAILY FOR DIABETES 30 mL 0   Insulin Pen Needle (B-D ULTRAFINE III SHORT PEN) 31G X 8 MM MISC USE WITH INSULIN NIGHTLY 100 each 1   latanoprost (XALATAN) 0.005 % ophthalmic solution Place 1 drop into both eyes every morning.     Magnesium 200 MG TABS Take 1 tablet by mouth at bedtime.     rosuvastatin (CRESTOR) 10 MG tablet TAKE 1 TABLET BY MOUTH ONCE DAILY FOR CHOLESTEROL 90 tablet 2   tirzepatide (MOUNJARO) 5 MG/0.5ML Pen Inject 5 mg into the skin once a week. for diabetes. 6 mL 0   VITAMIN A PO Take 2,400 mcg by mouth daily at 6 (six) AM.     Continuous Blood Gluc Sensor (DEXCOM G7 SENSOR) MISC Apply every 10 days to check blood sugars. (Patient not taking: Reported on 04/13/2023) 3 each 3   loperamide (IMODIUM A-D) 2 MG tablet Take 1 tablet (2 mg total) by mouth 4 (four) times daily as needed for diarrhea or loose stools. (Patient not taking: Reported on 04/13/2023) 30 tablet 0   No current facility-administered medications on file prior to visit.    BP 116/76   Pulse 70   Temp 98.2 F (36.8 C) (Temporal)   Ht 5' 1.8" (1.57 m)   Wt 201 lb (91.2 kg)   SpO2 99%   BMI 37.00 kg/m  Objective:   Physical Exam Cardiovascular:     Rate and Rhythm: Normal rate and regular rhythm.  Pulmonary:     Effort: Pulmonary effort is normal.     Breath sounds: Normal  breath sounds.  Musculoskeletal:     Cervical back: Neck supple.  Skin:    General: Skin is warm and dry.           Assessment & Plan:  Type 2 diabetes mellitus with hyperglycemia, unspecified whether long term insulin use (HCC) Assessment & Plan: Improving with A1C of 7.8 today!  Proceed with Mounjaro 5 mg tomorrow as scheduled. Continue Basaglar 32 units, may need to titrate down as she loses more weight. She will update.  Follow up in 3 months.  Orders: -     POCT glycosylated hemoglobin (Hb A1C)  Hypothyroidism, unspecified type -     Levothyroxine Sodium; TAKE 1 TABLET BY MOUTH IN THE MORNING ON  AN  EMPTY  STOMACH  WITH  WATER  ONLY  (NO  FOOD  OR  OTHER  MEDICATIONS  FOR  30  MINUTES)  Dispense: 90 tablet; Refill: 2        Doreene Nest, NP

## 2023-04-23 NOTE — Assessment & Plan Note (Signed)
Improving with A1C of 7.8 today!  Proceed with Mounjaro 5 mg tomorrow as scheduled. Continue Basaglar 32 units, may need to titrate down as she loses more weight. She will update.  Follow up in 3 months.

## 2023-04-23 NOTE — Patient Instructions (Signed)
Start Mounjaro 5 mg weekly as scheduled.  Continue your insulin for now.  Keep me updated regarding your blood sugars.  Please schedule a follow up visit for 3 months.  It was a pleasure to see you today!

## 2023-04-28 ENCOUNTER — Ambulatory Visit: Payer: Medicare HMO | Admitting: Radiation Oncology

## 2023-04-29 ENCOUNTER — Other Ambulatory Visit: Payer: Medicare HMO

## 2023-04-29 ENCOUNTER — Encounter: Payer: Medicare HMO | Attending: Internal Medicine

## 2023-05-06 ENCOUNTER — Ambulatory Visit: Payer: Medicare HMO | Admitting: Radiation Oncology

## 2023-05-11 ENCOUNTER — Telehealth: Payer: Self-pay

## 2023-05-11 NOTE — Telephone Encounter (Signed)
Left message for patient regarding the CARE program.

## 2023-05-13 ENCOUNTER — Ambulatory Visit
Admission: RE | Admit: 2023-05-13 | Discharge: 2023-05-13 | Disposition: A | Discharge status: Home / Self Care | Payer: Medicare HMO | Source: Ambulatory Visit | Attending: Radiation Oncology | Admitting: Radiation Oncology

## 2023-05-13 ENCOUNTER — Encounter: Payer: Self-pay | Admitting: Radiation Oncology

## 2023-05-13 VITALS — BP 155/78 | HR 100 | Temp 96.8°F | Resp 19 | Wt 198.8 lb

## 2023-05-13 DIAGNOSIS — C50911 Malignant neoplasm of unspecified site of right female breast: Secondary | ICD-10-CM

## 2023-05-13 DIAGNOSIS — Z170 Estrogen receptor positive status [ER+]: Secondary | ICD-10-CM

## 2023-05-13 DIAGNOSIS — C50411 Malignant neoplasm of upper-outer quadrant of right female breast: Secondary | ICD-10-CM | POA: Diagnosis not present

## 2023-05-13 DIAGNOSIS — C50412 Malignant neoplasm of upper-outer quadrant of left female breast: Secondary | ICD-10-CM | POA: Diagnosis not present

## 2023-05-13 NOTE — Progress Notes (Signed)
Radiation Oncology Follow up Note  Name: Valerie Gordon   Date:   05/13/2023 MRN:  [id number] DOB: 06-03-1951    This 72 y.o. 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  REFERRING PROVIDER: Doreene Nest, NP  HPI: Patient is a 72 year old female now out 7 months having completed bilateral breast radiation.  Seen today in routine follow-up she is doing well.  She specifically denies breast tenderness cough or bone pain..  She has mammograms and ultrasound scheduled for June.  She is currently on Arimidex tolerating it well without side effect  COMPLICATIONS OF TREATMENT: none  FOLLOW UP COMPLIANCE: keeps appointments   PHYSICAL EXAM:  BP (!) 155/78   Pulse 100   Temp (!) 96.8 F (36 C)   Resp 19   Wt 198 lb 12.8 oz (90.2 kg)   SpO2 98%   BMI 36.60 kg/m  Lungs are clear to A&P cardiac examination essentially unremarkable with regular rate and rhythm. No dominant mass or nodularity is noted in either breast in 2 positions examined. Incision is well-healed. No axillary or supraclavicular adenopathy is appreciated. Cosmetic result is excellent.  Well-developed well-nourished patient in NAD. HEENT reveals PERLA, EOMI, discs not visualized.  Oral cavity is clear. No oral mucosal lesions are identified. Neck is clear without evidence of cervical or supraclavicular adenopathy. Lungs are clear to A&P. Cardiac examination is essentially unremarkable with regular rate and rhythm without murmur rub or thrill. Abdomen is benign with no organomegaly or masses noted. Motor sensory and DTR levels are equal and symmetric in the upper and lower extremities. Cranial nerves II through XII are grossly intact. Proprioception is intact. No peripheral adenopathy or edema is identified. No motor or sensory levels are noted. Crude visual  fields are within normal range.  RADIOLOGY RESULTS: No current films for review  PLAN: Present time patient is doing well with no evidence of disease now out 7 months.  And pleased with her overall progress.  I asked to see her back in 6 months for follow-up.  Anticipate mammograms to be performed in June.  She continues on Arimidex without side effect.  Patient is to call with any concerns  .  I would like to take this opportunity to thank you for allowing me to participate in the care of your patient.Carmina Miller, MD

## 2023-05-13 NOTE — Progress Notes (Signed)
CARE Discharge Progress Report  Patient Details  Name: Valerie Gordon MRN: [id number] Date of Birth: [birth year] Referring Provider:     Number of Visits: 3  Reason for Discharge:  Early Exit:  Personal - Work  Smoking History:  Social History   Tobacco Use  Smoking Status Never  Smokeless Tobacco Never    Diagnosis:  Carcinoma of upper-outer quadrant of right breast in female, estrogen receptor positive (HCC)  ADL UCSD:   Initial Exercise Prescription:  Initial Exercise Prescription - 04/13/23 1400       Treadmill   MPH 1.4    Grade 0    Minutes 15    METs 2.07      NuStep   Level 1    SPM 80    Minutes 15    METs 1.8      REL-XR   Level 1    Speed 50    Minutes 15    METs 1.8      Biostep-RELP   Level 1    SPM 50    Minutes 15    METs 1.8      Track   Laps 17    Minutes 15    METs 1.92      Prescription Details   Frequency (times per week) 2    Duration Progress to 30 minutes of continuous aerobic without signs/symptoms of physical distress      Intensity   THRR 40-80% of Max Heartrate 106 - 136    Ratings of Perceived Exertion 11-13    Perceived Dyspnea 0-4      Progression   Progression Continue to progress workloads to maintain intensity without signs/symptoms of physical distress.      Resistance Training   Training Prescription Yes    Weight 3 lb    Reps 10-15             Discharge Exercise Prescription (Final Exercise Prescription Changes):  Exercise Prescription Changes - 04/13/23 1400       Response to Exercise   Blood Pressure (Admit) 104/66    Blood Pressure (Exercise) 132/70    Blood Pressure (Exit) 112/66    Heart Rate (Admit) 76 bpm    Heart Rate (Exercise) 112 bpm    Heart Rate (Exit) 77 bpm    Oxygen Saturation (Admit) 96 %    Oxygen Saturation (Exercise) 97 %    Oxygen Saturation (Exit) 96 %    Rating of Perceived Exertion (Exercise) 15    Perceived Dyspnea (Exercise) 2    Symptoms Bilateral hip pain  8/10, slight SOB    Comments walk test results             Functional Capacity:  6 Minute Walk     Row Name 04/13/23 1401         6 Minute Walk   Phase Initial     Distance 930 feet     Walk Time 5.5 minutes     # of Rest Breaks 2  2:52-3:10; 4:56-5:10     MPH 1.76     METS 1.84     RPE 15     Perceived Dyspnea  2     VO2 Peak 6.44     Symptoms Yes (comment)     Comments Bilateral hip pain 8/10, slight SOB     Resting HR 76 bpm     Resting BP 104/66     Resting Oxygen Saturation  96 %  Exercise Oxygen Saturation  during 6 min walk 97 %     Max Ex. HR 112 bpm     Max Ex. BP 132/70     2 Minute Post BP 112/66                Nutrition & Weight - Outcomes:  Pre Biometrics - 04/13/23 1352       Pre Biometrics   Height 5' 1.8" (1.57 m)    Weight 203 lb 4.8 oz (92.2 kg)    BMI (Calculated) 37.41    Single Leg Stand 6.2 seconds               Goals reviewed with patient; copy given to patient.

## 2023-05-25 DIAGNOSIS — Z853 Personal history of malignant neoplasm of breast: Secondary | ICD-10-CM | POA: Diagnosis not present

## 2023-05-26 ENCOUNTER — Ambulatory Visit
Admission: RE | Admit: 2023-05-26 | Discharge: 2023-05-26 | Disposition: A | Discharge status: Home / Self Care | Payer: Medicare HMO | Source: Ambulatory Visit | Attending: General Surgery | Admitting: General Surgery

## 2023-05-26 DIAGNOSIS — Z853 Personal history of malignant neoplasm of breast: Secondary | ICD-10-CM | POA: Diagnosis not present

## 2023-05-31 ENCOUNTER — Other Ambulatory Visit: Payer: Medicare HMO

## 2023-06-28 ENCOUNTER — Inpatient Hospital Stay: Payer: Medicare HMO | Admitting: Medical Oncology

## 2023-06-28 DIAGNOSIS — E1165 Type 2 diabetes mellitus with hyperglycemia: Secondary | ICD-10-CM

## 2023-06-29 MED ORDER — TIRZEPATIDE 7.5 MG/0.5ML ~~LOC~~ SOAJ
7.5000 mg | SUBCUTANEOUS | 0 refills | Status: DC
Start: 2023-06-29 — End: 2023-09-30

## 2023-07-02 MED ORDER — DEXCOM G7 SENSOR MISC: 3 refills | Status: DC

## 2023-07-12 DIAGNOSIS — L578 Other skin changes due to chronic exposure to nonionizing radiation: Secondary | ICD-10-CM | POA: Diagnosis not present

## 2023-07-12 DIAGNOSIS — Z85828 Personal history of other malignant neoplasm of skin: Secondary | ICD-10-CM | POA: Diagnosis not present

## 2023-07-12 DIAGNOSIS — Z86018 Personal history of other benign neoplasm: Secondary | ICD-10-CM | POA: Diagnosis not present

## 2023-07-12 DIAGNOSIS — Z872 Personal history of diseases of the skin and subcutaneous tissue: Secondary | ICD-10-CM | POA: Diagnosis not present

## 2023-07-14 ENCOUNTER — Inpatient Hospital Stay: Payer: Medicare HMO | Attending: Internal Medicine | Admitting: Internal Medicine

## 2023-07-14 ENCOUNTER — Encounter: Payer: Self-pay | Admitting: Internal Medicine

## 2023-07-14 VITALS — BP 156/74 | HR 76 | Temp 98.6°F | Resp 18 | Wt 193.0 lb

## 2023-07-14 DIAGNOSIS — Z79811 Long term (current) use of aromatase inhibitors: Secondary | ICD-10-CM | POA: Insufficient documentation

## 2023-07-14 DIAGNOSIS — M545 Low back pain, unspecified: Secondary | ICD-10-CM | POA: Diagnosis not present

## 2023-07-14 DIAGNOSIS — R5383 Other fatigue: Secondary | ICD-10-CM | POA: Insufficient documentation

## 2023-07-14 DIAGNOSIS — Z800 Family history of malignant neoplasm of digestive organs: Secondary | ICD-10-CM | POA: Insufficient documentation

## 2023-07-14 DIAGNOSIS — E669 Obesity, unspecified: Secondary | ICD-10-CM | POA: Diagnosis not present

## 2023-07-14 DIAGNOSIS — Z170 Estrogen receptor positive status [ER+]: Secondary | ICD-10-CM | POA: Diagnosis not present

## 2023-07-14 DIAGNOSIS — Z801 Family history of malignant neoplasm of trachea, bronchus and lung: Secondary | ICD-10-CM | POA: Diagnosis not present

## 2023-07-14 DIAGNOSIS — G4700 Insomnia, unspecified: Secondary | ICD-10-CM | POA: Insufficient documentation

## 2023-07-14 DIAGNOSIS — R232 Flushing: Secondary | ICD-10-CM | POA: Diagnosis not present

## 2023-07-14 DIAGNOSIS — G8929 Other chronic pain: Secondary | ICD-10-CM | POA: Insufficient documentation

## 2023-07-14 DIAGNOSIS — E119 Type 2 diabetes mellitus without complications: Secondary | ICD-10-CM | POA: Diagnosis not present

## 2023-07-14 DIAGNOSIS — C50411 Malignant neoplasm of upper-outer quadrant of right female breast: Secondary | ICD-10-CM | POA: Diagnosis not present

## 2023-07-14 DIAGNOSIS — Z923 Personal history of irradiation: Secondary | ICD-10-CM | POA: Diagnosis not present

## 2023-07-14 DIAGNOSIS — M8580 Other specified disorders of bone density and structure, unspecified site: Secondary | ICD-10-CM | POA: Insufficient documentation

## 2023-07-14 MED ORDER — ANASTROZOLE 1 MG PO TABS: 1.0000 mg | ORAL_TABLET | Freq: Every day | ORAL | 1 refills | Status: DC

## 2023-07-14 NOTE — Assessment & Plan Note (Addendum)
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 PN=0; G-2. ?  Focal involvement of the margin by DCIS; invasive carcinoma negative margins. Oncotype-RS =8; low risk.  S/p radiation.   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   #Patient on Anastrazole [since Nov 1st 2023]-patient tolerating well except for mild hot flashes/fatigue/insomnia. Mammogram/Dr.Cintron- MAY 2024- WNL.   # Fatigue/insomnia-?  Related to anastrozole.  Recommend care program.   # Hot flashes- from anastrozole G-1 monitor now. Stable.   # Osteopenia: BMD March 2023- T-score of -1.9.  Continue On ca+vit D.  Stable.   # DM/ Obesity- on Monjauro-   importance of healthy weight/and weight loss.[NOOM] -Stable.   # DISPOSITION: # follow up in 6 months  MD; labs-cbc/cmp; vit D levels-Dr.B  Dr.Clark- [place], [place].

## 2023-07-14 NOTE — Progress Notes (Signed)
Pt has hot flashes from anastrozole. Denies any breast pain or discomfort. 30 lb weight loss since starting on Mounjoro. Appetite is reduced. Grateful for her current health. Chronic low back pain. Had a recent breast exam with her surgeon.

## 2023-07-14 NOTE — Progress Notes (Signed)
[facility] CONSULT NOTE  Patient Care Team: Doreene Nest, NP as PCP - General (Internal Medicine) Arvil Chaco, Belia Heman, MD as Consulting Physician (Obstetrics and Gynecology) Lemar Livings, Merrily Pew, MD (General Surgery) Linna Darner, RD as Dietitian (Family Medicine) Scarlett Presto, RN (Inactive) as Oncology Nurse Navigator Hulen Luster, RN as Oncology Nurse Navigator Earna Coder, MD as Consulting Physician (Oncology) Carmina Miller, MD as Consulting Physician (Radiation Oncology)  CHIEF COMPLAINTS/PURPOSE OF CONSULTATION: Breast cancer  #  Oncology History Overview Note  IMPRESSION: 1. 1.3 cm irregular mass with associated distortion in the right breast at 11 o'clock, 2 cm the nipple, highly suspicious for malignancy. No abnormal right axillary lymph nodes.   RECOMMENDATION: 1. Ultrasound-guided core needle biopsy of the right breast highly suspicious mass. A.  BREAST MASS, RIGHT 11:00 2 CM FN; ULTRASOUND-GUIDED BIOPSY:  - INVASIVE MAMMARY CARCINOMA WITH LOBULAR FEATURES.   Size of invasive carcinoma: 8 mm in this sample  Histologic grade of invasive carcinoma: Grade 2                       Glandular/tubular differentiation score: 3                       Nuclear pleomorphism score: 2                       Mitotic rate score: 1                       Total score: 6  Ductal carcinoma in situ: Present, intermediate grade  Lymphovascular invasion: Not identified   ER/PR/HER2: Immunohistochemistry will be performed on block A2, with  reflex to FISH for HER2 2+. The results will be reported in an addendum.   Comment:  The definitive grade will be assigned on the excisional specimen.   GROSS DESCRIPTION:  A. Labeled: Right breast 11:00 2 cm from the nipple  Received: Formalin  Time/date in fixative: Collected and placed in formalin at 8:30 AM on  04/28/2022  Cold ischemic time: Less than 1 minute  Total fixation time: Approximately 9 hours  Core  pieces: 4  Size: Ranges from 0.5-2.0 cm in length and 0.2 cm in diameter  Description: Tan cores of fibrofatty tissue  Ink color: Black  Entirely submitted in 2 cassettes with 2 cores in A1 and 2 cores in A2.   CM 04/28/2022   Final Diagnosis performed by Elijah Birk, MD.   Electronically signed  04/29/2022 1:04:41PM  The electronic signature indicates that the named Attending Pathologist  has evaluated the specimen  Technical component performed at [facility], [address], [place],  [place] [address] Lab: [phone] Dir: Jolene Schimke, MD, MMM   Professional component performed at [facility], [facility], [address] [place], [place], [place] [address] Lab: [phone]  Dir: Beryle Quant, MD   ADDENDUM:  CASE SUMMARY: BREAST BIOMARKER TESTS  Estrogen Receptor (ER) Status: POSITIVE          Percentage of cells with nuclear positivity: Greater than 90%          Average intensity of staining: Strong   Progesterone Receptor (PgR) Status: POSITIVE          Percentage of cells with nuclear positivity: Greater than 90%          Average intensity of staining: Strong   HER2 (by immunohistochemistry): NEGATIVE (Score  0)  Ki-67: Not performed   #April 2023 clinical stage I -ER/PR positive HER2 =0 invasive mammary carcinoma with lobular features.  [Dr.Byrnett] May 2023 breast MRI-right side lesion-measuring 3.4 x 1.7 x 2.2 centimeters.     #May 2023 breast MRI showed-left breast indeterminate oval 1.5 centimeter mass in the UPPER-OUTER QUADRANT LEFT.  S/p biopsy positive for ER/PR positive DCIS. S/p lumpectomy  # Oncotype recurrence score 8-low risk; no chemotherapy  # Bilateral radiation-finished mid September 2023;   # Mid-OCT 2023- START Anastrazole      Carcinoma of upper-outer quadrant of right breast in female, estrogen receptor positive (HCC)  05/06/2022 Initial Diagnosis   Carcinoma of upper-outer quadrant of right breast in female, estrogen receptor positive  (HCC)   05/06/2022 Cancer Staging   Staging form: Breast, AJCC 8th Edition - Clinical: Stage IA (cT1c, cN0, cM0, G2, ER+, PR+, HER2-) - Signed by Earna Coder, MD on 05/06/2022 Histologic grading system: 3 grade system    HISTORY OF PRESENTING ILLNESS: Ambulating independently.  Alone.   Valerie Gordon 72 y.o.  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 Patient currently on anastrozole.   Pt has hot flashes from anastrozole. Denies any breast pain or discomfort.   30 lb weight loss since starting on Mounjoro. Appetite is reduced.   Grateful for her current health. Chronic low back pain.   Had a recent breast exam with her surgeon.   Review of Systems  Constitutional:  Negative for chills, diaphoresis, fever, malaise/fatigue and weight loss.  HENT:  Negative for nosebleeds and sore throat.   Eyes:  Negative for double vision.  Respiratory:  Negative for cough, hemoptysis, sputum production, shortness of breath and wheezing.   Cardiovascular:  Negative for chest pain, palpitations, orthopnea and leg swelling.  Gastrointestinal:  Negative for abdominal pain, blood in stool, constipation, diarrhea, heartburn, melena, nausea and vomiting.  Genitourinary:  Negative for dysuria, frequency and urgency.  Musculoskeletal:  Negative for back pain and joint pain.  Skin: Negative.  Negative for itching and rash.  Neurological:  Negative for dizziness, tingling, focal weakness, weakness and headaches.  Endo/Heme/Allergies:  Does not bruise/bleed easily.  Psychiatric/Behavioral:  Negative for depression. The patient is not nervous/anxious and does not have insomnia.      MEDICAL HISTORY:  Past Medical History:  Diagnosis Date   Arthritis    lumbar spine   Breast cancer (HCC)    Cataract    COVID-19 virus infection 10/14/2021   Depression     Gallstones    asyptomatic   GERD (gastroesophageal reflux disease)    Glaucoma    Headache    h/o migraines   Hyperlipidemia    Hypothyroidism    Obesity    Thyroid disease    Type 2 diabetes mellitus (HCC)    manages with diet and exercise    SURGICAL HISTORY: Past Surgical History:  Procedure Laterality Date   AXILLARY SENTINEL NODE BIOPSY Right 06/17/2022   Procedure: AXILLARY SENTINEL NODE BIOPSY;  Surgeon: Earline Mayotte, MD;  Location: [facility] ORS;  Service: General;  Laterality: Right;   BREAST BIOPSY  12/28/2008   BREAST BIOPSY Right 04/28/2022   [place] core bx 11:00 coil clip -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   BREAST LUMPECTOMY  WITH NEEDLE LOCALIZATION Bilateral 06/17/2022   Procedure: BREAST LUMPECTOMY WITH NEEDLE LOCALIZATION;  Surgeon: Earline Mayotte, MD;  Location: [facility] ORS;  Service: General;  Laterality: Bilateral;   CARDIAC CATHETERIZATION  12/28/1996   and a glaucoma procedure as well that has a metal shunt in left eye   COLONOSCOPY  10/30/2004   COLONOSCOPY WITH PROPOFOL N/A 04/01/2016   Procedure: COLONOSCOPY WITH PROPOFOL;  Surgeon: Earline Mayotte, MD;  Location: [facility] ENDOSCOPY;  Service: Endoscopy;  Laterality: N/A;   EYE SURGERY Bilateral    cataract sx   TONSILLECTOMY     WISDOM TOOTH EXTRACTION  1983    SOCIAL HISTORY: Social History   Socioeconomic History   Marital status: Widowed    Spouse name: Not on file   Number of children: Not on file   Years of education: Not on file   Highest education level: Not on file  Occupational History   Not on file  Tobacco Use   Smoking status: Never   Smokeless tobacco: Never  Vaping Use   Vaping status: Never Used  Substance and Sexual Activity   Alcohol use: Yes    Comment: occasional wine   Drug use: No   Sexual activity: Yes  Other Topics Concern    Not on file  Social History Narrative   Widower.   Step children.   Librarian, academic.   Enjoys riding hot air balloons, spending time with friends, reading, jewelry   Social Determinants of Health   Financial Resource Strain: Low Risk  (12/02/2022)   Overall Financial Resource Strain (CARDIA)    Difficulty of Paying Living Expenses: Not hard at all  Food Insecurity: No Food Insecurity (12/02/2022)   Hunger Vital Sign    Worried About Running Out of Food in the Last Year: Never true    Ran Out of Food in the Last Year: Never true  Transportation Needs: No Transportation Needs (12/02/2022)   PRAPARE - Administrator, Civil Service (Medical): No    Lack of Transportation (Non-Medical): No  Physical Activity: Insufficiently Active (12/02/2022)   Exercise Vital Sign    Days of Exercise per Week: 5 days    Minutes of Exercise per Session: 20 min  Stress: No Stress Concern Present (12/02/2022)   [employer] of Occupational Health - Occupational Stress Questionnaire    Feeling of Stress : Not at all  Social Connections: Socially Isolated (12/02/2022)   Social Connection and Isolation Panel [NHANES]    Frequency of Communication with Friends and Family: More than three times a week    Frequency of Social Gatherings with Friends and Family: Once a week    Attends Religious Services: Never    Database administrator or Organizations: No    Attends Banker Meetings: Never    Marital Status: Widowed  Intimate Partner Violence: Not At Risk (12/02/2022)   Humiliation, Afraid, Rape, and Kick questionnaire    Fear of Current or Ex-Partner: No    Emotionally Abused: No    Physically Abused: No    Sexually Abused: No    FAMILY HISTORY: Family History  Problem Relation Age of Onset   Stroke Mother    Heart disease Father    Lung cancer Maternal Grandmother    Stomach cancer Paternal Grandmother    Breast cancer Neg Hx     ALLERGIES:  is allergic  to ciprofloxacin; trulicity [dulaglutide]; and antihistamines, chlorpheniramine-type.  MEDICATIONS:  Current Outpatient Medications  Medication Sig  Dispense Refill   b complex vitamins tablet Take 1 tablet by mouth daily.     BIOTIN PO Take 5 mg by mouth daily.     Cholecalciferol 50 MCG (2000 UT) CAPS Take 1 capsule by mouth daily after lunch.     Continuous Glucose Sensor (DEXCOM G7 SENSOR) MISC Apply every 10 days to check blood sugars. 3 each 3   famotidine (PEPCID) 20 MG tablet Take 20 mg by mouth at bedtime.     Fish Oil-Krill Oil (KRILL OIL PLUS PO) Take 1 tablet by mouth daily at 6 (six) AM.     fluticasone (FLONASE) 50 MCG/ACT nasal spray Place into both nostrils daily as needed for allergies or rhinitis.     Insulin Glargine (BASAGLAR KWIKPEN) 100 UNIT/ML INJECT 32 UNITS SUBCUTANEOUSLY ONCE DAILY FOR DIABETES 30 mL 0   Insulin Pen Needle (B-D ULTRAFINE III SHORT PEN) 31G X 8 MM MISC USE WITH INSULIN NIGHTLY 100 each 1   latanoprost (XALATAN) 0.005 % ophthalmic solution Place 1 drop into both eyes every morning.     levothyroxine (SYNTHROID) 100 MCG tablet TAKE 1 TABLET BY MOUTH IN THE MORNING ON  AN  EMPTY  STOMACH  WITH  WATER  ONLY  (NO  FOOD  OR  OTHER  MEDICATIONS  FOR  30  MINUTES) 90 tablet 2   Magnesium 200 MG TABS Take 1 tablet by mouth at bedtime.     rosuvastatin (CRESTOR) 10 MG tablet TAKE 1 TABLET BY MOUTH ONCE DAILY FOR CHOLESTEROL 90 tablet 2   tirzepatide (MOUNJARO) 7.5 MG/0.5ML Pen Inject 7.5 mg into the skin once a week. for diabetes. 6 mL 0   VITAMIN A PO Take 2,400 mcg by mouth daily at 6 (six) AM.     anastrozole (ARIMIDEX) 1 MG tablet Take 1 tablet (1 mg total) by mouth daily. 90 tablet 1   No current facility-administered medications for this visit.      Marland Kitchen  PHYSICAL EXAMINATION: ECOG PERFORMANCE STATUS: 0 - Asymptomatic  Vitals:   07/14/23 1505  BP: (!) 156/74  Pulse: 76  Resp: 18  Temp: 98.6 F (37 C)  SpO2: 98%   Filed Weights   07/14/23 1505   Weight: 193 lb (87.5 kg)    Physical Exam Vitals and nursing note reviewed.  HENT:     Head: Normocephalic and atraumatic.     Mouth/Throat:     Pharynx: Oropharynx is clear.  Eyes:     Extraocular Movements: Extraocular movements intact.     Pupils: Pupils are equal, round, and reactive to light.  Cardiovascular:     Rate and Rhythm: Normal rate and regular rhythm.  Abdominal:     Palpations: Abdomen is soft.  Musculoskeletal:        General: Normal range of motion.     Cervical back: Normal range of motion.  Skin:    General: Skin is warm.  Neurological:     General: No focal deficit present.     Mental Status: She is alert and oriented to person, place, and time.  Psychiatric:        Behavior: Behavior normal.        Judgment: Judgment normal.      LABORATORY DATA:  I have reviewed the data as listed Lab Results  Component Value Date   WBC 7.5 03/19/2023   HGB 13.6 03/19/2023   HCT 40.0 03/19/2023   MCV 92.8 03/19/2023   PLT 281.0 03/19/2023   Recent Labs  01/20/23 1530 03/19/23 1138  NA 134* 134*  K 4.2 4.5  CL 98 99  CO2 23 28  GLUCOSE 145* 237*  BUN 9 10  CREATININE 0.90 0.93  CALCIUM 10.2 10.1  PROT 7.1  --   ALBUMIN 4.4  --   AST 34  --   ALT 14  --   ALKPHOS 73  --   BILITOT 0.3  --     RADIOGRAPHIC STUDIES: I have personally reviewed the radiological images as listed and agreed with the findings in the report. No results found.  ASSESSMENT & PLAN:   Carcinoma of upper-outer quadrant of right breast in female, estrogen receptor positive (HCC) # Right breast cancer stage I [id number]-INVASIVE MAMMARY CARCINOMA WITH DUCTAL AND LOBULAR FEATURES, 14 MM. PN=0; G-2. ?  Focal involvement of the margin by DCIS; invasive carcinoma negative margins. Oncotype-RS =8; low risk.  S/p radiation.   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   #Patient on Anastrazole [since Nov 1st 2023]-patient tolerating well except for mild hot  flashes/fatigue/insomnia. Mammogram/Dr.Cintron- MAY 2024- WNL.   # Fatigue/insomnia-?  Related to anastrozole.  Recommend care program.   # Hot flashes- from anastrozole G-1 monitor now. Stable.   # Osteopenia: BMD March 2023- T-score of -1.9.  Continue On ca+vit D.  Stable.   # DM/ Obesity- on Monjauro-   importance of healthy weight/and weight loss.[NOOM] -Stable.   # DISPOSITION: # follow up in 6 months  MD; labs-cbc/cmp; vit D levels-Dr.B  Dr.Clark- [place], [place].     All questions were answered. The patient/family knows to call the clinic with any problems, questions or concerns.       Earna Coder, MD 07/14/2023 3:31 PM

## 2023-07-21 ENCOUNTER — Ambulatory Visit (INDEPENDENT_AMBULATORY_CARE_PROVIDER_SITE_OTHER): Payer: Medicare HMO

## 2023-07-21 VITALS — Ht 61.0 in | Wt 188.0 lb

## 2023-07-21 DIAGNOSIS — Z0000 Encounter for general adult medical examination without abnormal findings: Secondary | ICD-10-CM | POA: Diagnosis not present

## 2023-07-21 NOTE — Patient Instructions (Signed)
Valerie Gordon , Thank you for taking time to come for your Medicare Wellness Visit. I appreciate your ongoing commitment to your health goals. Please review the following plan we discussed and let me know if I can assist you in the future.   These are the goals we discussed:  Goals      DIET - EAT MORE FRUITS AND VEGETABLES     Patient Stated     08/23/2020, I will continue to work with my dietician to help me lose 30-40 lbs total.      Patient Stated     Would like to lose weight and increase energy.     Patient Stated     Lose 10 lbs.        This is a list of the screening recommended for you and due dates:  Health Maintenance  Topic Date Due   COVID-19 Vaccine (4 - 2023-24 season) 08/28/2022   Yearly kidney health urinalysis for diabetes  05/07/2023   Flu Shot  07/29/2023   Eye exam for diabetics  08/22/2023   Hemoglobin A1C  10/23/2023   Complete foot exam   01/21/2024   Yearly kidney function blood test for diabetes  03/18/2024   Mammogram  05/25/2024   Medicare Annual Wellness Visit  07/20/2024   DTaP/Tdap/Td vaccine (3 - Td or Tdap) 03/30/2026   Colon Cancer Screening  04/01/2026   Pneumonia Vaccine  Completed   DEXA scan (bone density measurement)  Completed   Hepatitis C Screening  Completed   Zoster (Shingles) Vaccine  Completed   HPV Vaccine  Aged Out    Advanced directives: Please bring a copy of your health care power of attorney and living will to the office to be added to your chart at your convenience.   Conditions/risks identified: Aim for 30 minutes of exercise or brisk walking, 6-8 glasses of water, and 5 servings of fruits and vegetables each day.   Next appointment: Follow up in one year for your annual wellness visit  07/26/24 @ 3pm televisit   Preventive Care 65 Years and Older, Female Preventive care refers to lifestyle choices and visits with your health care provider that can promote health and wellness. What does preventive care include? A  yearly physical exam. This is also called an annual well check. Dental exams once or twice a year. Routine eye exams. Ask your health care provider how often you should have your eyes checked. Personal lifestyle choices, including: Daily care of your teeth and gums. Regular physical activity. Eating a healthy diet. Avoiding tobacco and drug use. Limiting alcohol use. Practicing safe sex. Taking low-dose aspirin every day. Taking vitamin and mineral supplements as recommended by your health care provider. What happens during an annual well check? The services and screenings done by your health care provider during your annual well check will depend on your age, overall health, lifestyle risk factors, and family history of disease. Counseling  Your health care provider may ask you questions about your: Alcohol use. Tobacco use. Drug use. Emotional well-being. Home and relationship well-being. Sexual activity. Eating habits. History of falls. Memory and ability to understand (cognition). Work and work Astronomer. Reproductive health. Screening  You may have the following tests or measurements: Height, weight, and BMI. Blood pressure. Lipid and cholesterol levels. These may be checked every 5 years, or more frequently if you are over 78 years old. Skin check. Lung cancer screening. You may have this screening every year starting at age 5 if you  have a 30-pack-year history of smoking and currently smoke or have quit within the past 15 years. Fecal occult blood test (FOBT) of the stool. You may have this test every year starting at age 10. Flexible sigmoidoscopy or colonoscopy. You may have a sigmoidoscopy every 5 years or a colonoscopy every 10 years starting at age 50. Hepatitis C blood test. Hepatitis B blood test. Sexually transmitted disease (STD) testing. Diabetes screening. This is done by checking your blood sugar (glucose) after you have not eaten for a while (fasting).  You may have this done every 1-3 years. Bone density scan. This is done to screen for osteoporosis. You may have this done starting at age 37. Mammogram. This may be done every 1-2 years. Talk to your health care provider about how often you should have regular mammograms. Talk with your health care provider about your test results, treatment options, and if necessary, the need for more tests. Vaccines  Your health care provider may recommend certain vaccines, such as: Influenza vaccine. This is recommended every year. Tetanus, diphtheria, and acellular pertussis (Tdap, Td) vaccine. You may need a Td booster every 10 years. Zoster vaccine. You may need this after age 33. Pneumococcal 13-valent conjugate (PCV13) vaccine. One dose is recommended after age 12. Pneumococcal polysaccharide (PPSV23) vaccine. One dose is recommended after age 44. Talk to your health care provider about which screenings and vaccines you need and how often you need them. This information is not intended to replace advice given to you by your health care provider. Make sure you discuss any questions you have with your health care provider. Document Released: 01/10/2016 Document Revised: 09/02/2016 Document Reviewed: 10/15/2015 Elsevier Interactive Patient Education  2017 [employer].  Fall Prevention in the Home Falls can cause injuries. They can happen to people of all ages. There are many things you can do to make your home safe and to help prevent falls. What can I do on the outside of my home? Regularly fix the edges of walkways and driveways and fix any cracks. Remove anything that might make you trip as you walk through a door, such as a raised step or threshold. Trim any bushes or trees on the path to your home. Use bright outdoor lighting. Clear any walking paths of anything that might make someone trip, such as rocks or tools. Regularly check to see if handrails are loose or broken. Make sure that both sides  of any steps have handrails. Any raised decks and porches should have guardrails on the edges. Have any leaves, snow, or ice cleared regularly. Use sand or salt on walking paths during winter. Clean up any spills in your garage right away. This includes oil or grease spills. What can I do in the bathroom? Use night lights. Install grab bars by the toilet and in the tub and shower. Do not use towel bars as grab bars. Use non-skid mats or decals in the tub or shower. If you need to sit down in the shower, use a plastic, non-slip stool. Keep the floor dry. Clean up any water that spills on the floor as soon as it happens. Remove soap buildup in the tub or shower regularly. Attach bath mats securely with double-sided non-slip rug tape. Do not have throw rugs and other things on the floor that can make you trip. What can I do in the bedroom? Use night lights. Make sure that you have a light by your bed that is easy to reach. Do not use  any sheets or blankets that are too big for your bed. They should not hang down onto the floor. Have a firm chair that has side arms. You can use this for support while you get dressed. Do not have throw rugs and other things on the floor that can make you trip. What can I do in the kitchen? Clean up any spills right away. Avoid walking on wet floors. Keep items that you use a lot in easy-to-reach places. If you need to reach something above you, use a strong step stool that has a grab bar. Keep electrical cords out of the way. Do not use floor polish or wax that makes floors slippery. If you must use wax, use non-skid floor wax. Do not have throw rugs and other things on the floor that can make you trip. What can I do with my stairs? Do not leave any items on the stairs. Make sure that there are handrails on both sides of the stairs and use them. Fix handrails that are broken or loose. Make sure that handrails are as long as the stairways. Check any  carpeting to make sure that it is firmly attached to the stairs. Fix any carpet that is loose or worn. Avoid having throw rugs at the top or bottom of the stairs. If you do have throw rugs, attach them to the floor with carpet tape. Make sure that you have a light switch at the top of the stairs and the bottom of the stairs. If you do not have them, ask someone to add them for you. What else can I do to help prevent falls? Wear shoes that: Do not have high heels. Have rubber bottoms. Are comfortable and fit you well. Are closed at the toe. Do not wear sandals. If you use a stepladder: Make sure that it is fully opened. Do not climb a closed stepladder. Make sure that both sides of the stepladder are locked into place. Ask someone to hold it for you, if possible. Clearly mark and make sure that you can see: Any grab bars or handrails. First and last steps. Where the edge of each step is. Use tools that help you move around (mobility aids) if they are needed. These include: Canes. Walkers. Scooters. Crutches. Turn on the lights when you go into a dark area. Replace any light bulbs as soon as they burn out. Set up your furniture so you have a clear path. Avoid moving your furniture around. If any of your floors are uneven, fix them. If there are any pets around you, be aware of where they are. Review your medicines with your doctor. Some medicines can make you feel dizzy. This can increase your chance of falling. Ask your doctor what other things that you can do to help prevent falls. This information is not intended to replace advice given to you by your health care provider. Make sure you discuss any questions you have with your health care provider. Document Released: 10/10/2009 Document Revised: 05/21/2016 Document Reviewed: 01/18/2015 Elsevier Interactive Patient Education  2017 [employer].

## 2023-07-21 NOTE — Progress Notes (Signed)
Subjective:   Valerie Gordon is a 72 y.o. female who presents for Medicare Annual (Subsequent) preventive examination.  Visit Complete: Virtual  I connected with  Valerie Gordon on 07/21/23 by a audio enabled telemedicine application and verified that I am speaking with the correct person using two identifiers.  Patient Location: Home  Provider Location: Home Office  I discussed the limitations of evaluation and management by telemedicine. The patient expressed understanding and agreed to proceed.  Patient Medicare AWV questionnaire was completed by the patient on 07/21/23; I have confirmed that all information answered by patient is correct and no changes since this date.  Review of Systems      Cardiac Risk Factors include: hypertension;diabetes mellitus;obesity (BMI >30kg/m2)   Per patient no change in vitals since last visit; unable to obtain new vitals due to this being a telehealth visit.   Patient was unable to self-report vital signs via telehealth due to a lack of equipment at home.      Objective:    Today's Vitals   07/21/23 1458  Weight: 188 lb (85.3 kg)  Height: 5\' 1"  (1.549 m)   Body mass index is 35.52 kg/m.     07/21/2023    3:06 PM 07/14/2023    3:03 PM 05/13/2023    2:35 PM 12/11/2022   10:34 AM 12/02/2022    3:14 PM 09/09/2022   10:49 AM 07/06/2022   10:13 AM  Advanced Directives  Does Patient Have a Medical Advance Directive? Yes Yes No No No Yes No  Type of Estate agent of [place];Living will Healthcare Power of [place];Living will    Living will   Does patient want to make changes to medical advance directive?       No - Patient declined  Copy of Healthcare Power of Attorney in Chart? No - copy requested No - copy requested       Would patient like information on creating a medical advance directive?   No - Patient declined No - Patient declined No - Patient declined  No - Patient declined    Current Medications  (verified) Outpatient Encounter Medications as of 07/21/2023  Medication Sig   anastrozole (ARIMIDEX) 1 MG tablet Take 1 tablet (1 mg total) by mouth daily.   b complex vitamins tablet Take 1 tablet by mouth daily.   BIOTIN PO Take 5 mg by mouth daily.   Cholecalciferol 50 MCG (2000 UT) CAPS Take 1 capsule by mouth daily after lunch.   Continuous Glucose Sensor (DEXCOM G7 SENSOR) MISC Apply every 10 days to check blood sugars.   famotidine (PEPCID) 20 MG tablet Take 20 mg by mouth at bedtime.   Fish Oil-Krill Oil (KRILL OIL PLUS PO) Take 1 tablet by mouth daily at 6 (six) AM.   fluticasone (FLONASE) 50 MCG/ACT nasal spray Place into both nostrils daily as needed for allergies or rhinitis.   Insulin Pen Needle (B-D ULTRAFINE III SHORT PEN) 31G X 8 MM MISC USE WITH INSULIN NIGHTLY   latanoprost (XALATAN) 0.005 % ophthalmic solution Place 1 drop into both eyes every morning.   levothyroxine (SYNTHROID) 100 MCG tablet TAKE 1 TABLET BY MOUTH IN THE MORNING ON  AN  EMPTY  STOMACH  WITH  WATER  ONLY  (NO  FOOD  OR  OTHER  MEDICATIONS  FOR  30  MINUTES)   Magnesium 200 MG TABS Take 1 tablet by mouth at bedtime.   rosuvastatin (CRESTOR) 10 MG tablet TAKE 1 TABLET  BY MOUTH ONCE DAILY FOR CHOLESTEROL   tirzepatide (MOUNJARO) 7.5 MG/0.5ML Pen Inject 7.5 mg into the skin once a week. for diabetes.   VITAMIN A PO Take 2,400 mcg by mouth daily at 6 (six) AM.   Insulin Glargine (BASAGLAR KWIKPEN) 100 UNIT/ML INJECT 32 UNITS SUBCUTANEOUSLY ONCE DAILY FOR DIABETES (Patient not taking: Reported on 07/21/2023)   No facility-administered encounter medications on file as of 07/21/2023.    Allergies (verified) Ciprofloxacin; Trulicity [dulaglutide]; and Antihistamines, chlorpheniramine-type   History: Past Medical History:  Diagnosis Date   Arthritis    lumbar spine   Breast cancer (HCC)    Cataract    COVID-19 virus infection 10/14/2021   Depression    Gallstones    asyptomatic   GERD (gastroesophageal  reflux disease)    Glaucoma    Headache    h/o migraines   Hyperlipidemia    Hypothyroidism    Obesity    Thyroid disease    Type 2 diabetes mellitus (HCC)    manages with diet and exercise   Past Surgical History:  Procedure Laterality Date   AXILLARY SENTINEL NODE BIOPSY Right 06/17/2022   Procedure: AXILLARY SENTINEL NODE BIOPSY;  Surgeon: Earline Mayotte, MD;  Location: [facility] ORS;  Service: General;  Laterality: Right;   BREAST BIOPSY  12/28/2008   BREAST BIOPSY Right 04/28/2022   [place] core bx 11:00 coil clip -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   BREAST LUMPECTOMY WITH NEEDLE LOCALIZATION Bilateral 06/17/2022   Procedure: BREAST LUMPECTOMY WITH NEEDLE LOCALIZATION;  Surgeon: Earline Mayotte, MD;  Location: [facility] ORS;  Service: General;  Laterality: Bilateral;   CARDIAC CATHETERIZATION  12/28/1996   and a glaucoma procedure as well that has a metal shunt in left eye   COLONOSCOPY  10/30/2004   COLONOSCOPY WITH PROPOFOL N/A 04/01/2016   Procedure: COLONOSCOPY WITH PROPOFOL;  Surgeon: Earline Mayotte, MD;  Location: [facility] ENDOSCOPY;  Service: Endoscopy;  Laterality: N/A;   EYE SURGERY Bilateral    cataract sx   TONSILLECTOMY     WISDOM TOOTH EXTRACTION  1983   Family History  Problem Relation Age of Onset   Stroke Mother    Heart disease Father    Lung cancer Maternal Grandmother    Stomach cancer Paternal Grandmother    Breast cancer Neg Hx    Social History   Socioeconomic History   Marital status: Widowed    Spouse name: Not on file   Number of children: Not on file   Years of education: Not on file   Highest education level: Not on file  Occupational History   Not on file  Tobacco Use   Smoking status: Never   Smokeless tobacco: Never  Vaping Use   Vaping status: Never Used  Substance and Sexual Activity   Alcohol  use: Yes    Comment: occasional wine   Drug use: No   Sexual activity: Yes  Other Topics Concern   Not on file  Social History Narrative   Widower.   Step children.   Librarian, academic.   Enjoys riding hot air balloons, spending time with friends, reading, jewelry   Social Determinants of Health   Financial Resource Strain: Patient Declined (07/21/2023)   Overall Financial Resource Strain (CARDIA)    Difficulty of Paying Living Expenses: Patient declined  Food Insecurity: No Food Insecurity (07/21/2023)  Hunger Vital Sign    Worried About Running Out of Food in the Last Year: Never true    Ran Out of Food in the Last Year: Never true  Transportation Needs: No Transportation Needs (07/21/2023)   PRAPARE - Administrator, Civil Service (Medical): No    Lack of Transportation (Non-Medical): No  Physical Activity: Insufficiently Active (07/21/2023)   Exercise Vital Sign    Days of Exercise per Week: 7 days    Minutes of Exercise per Session: 10 min  Stress: Patient Declined (07/21/2023)   [employer] of Occupational Health - Occupational Stress Questionnaire    Feeling of Stress : Patient declined  Social Connections: Moderately Isolated (07/21/2023)   Social Connection and Isolation Panel [NHANES]    Frequency of Communication with Friends and Family: More than three times a week    Frequency of Social Gatherings with Friends and Family: More than three times a week    Attends Religious Services: Never    Database administrator or Organizations: Yes    Attends Engineer, structural: More than 4 times per year    Marital Status: Widowed    Tobacco Counseling Counseling given: Not Answered   Clinical Intake:  Pre-visit preparation completed: Yes  Pain : No/denies pain     BMI - recorded: 35.52 Nutritional Status: BMI > 30  Obese Nutritional Risks: None Diabetes: No CBG done?: Yes (115 per pt) CBG resulted in Enter/ Edit results?:  No Did pt. bring in CBG monitor from home?: No  How often do you need to have someone help you when you read instructions, pamphlets, or other written materials from your doctor or pharmacy?: 1 - Never  Interpreter Needed?: No  Information entered by :: C.Lawson Isabell LPN   Activities of Daily Living    07/21/2023    9:34 AM 12/02/2022    3:14 PM  In your present state of health, do you have any difficulty performing the following activities:  Hearing? 0 0  Vision? 0 0  Difficulty concentrating or making decisions? 0 0  Walking or climbing stairs? 0 0  Dressing or bathing? 0 0  Doing errands, shopping? 0 0  Preparing Food and eating ? N N  Using the Toilet? N N  In the past six months, have you accidently leaked urine? N N  Do you have problems with loss of bowel control? N N  Managing your Medications? N N  Managing your Finances? N N  Housekeeping or managing your Housekeeping? N N    Patient Care Team: Doreene Nest, NP as PCP - General (Internal Medicine) Arvil Chaco, Belia Heman, MD as Consulting Physician (Obstetrics and Gynecology) Lemar Livings Merrily Pew, MD (General Surgery) Linna Darner, RD as Dietitian (Family Medicine) Scarlett Presto, RN (Inactive) as Oncology Nurse Navigator Hulen Luster, RN as Oncology Nurse Navigator Earna Coder, MD as Consulting Physician (Oncology) Carmina Miller, MD as Consulting Physician (Radiation Oncology)  Indicate any recent Medical Services you may have received from other than [facility] providers in the past year (date may be approximate).     Assessment:   This is a routine wellness examination for Valerie Gordon.  Hearing/Vision screen Hearing Screening - Comments:: Denies hearing difficulties   Vision Screening - Comments:: Glasses - UTD on eye exams - [facility]  Dietary issues and exercise activities discussed:     Goals Addressed             This Visit's Progress  Patient Stated       Lose 10 lbs.        Depression Screen    07/21/2023    3:05 PM 04/23/2023   10:16 AM 03/19/2023   10:57 AM 01/20/2023    2:54 PM 12/02/2022    3:12 PM 12/01/2021    9:54 AM 08/27/2021    2:57 PM  PHQ 2/9 Scores  PHQ - 2 Score 0 0 0 0 0 0 0  PHQ- 9 Score  0 0  0  0    Fall Risk    07/21/2023    9:34 AM 04/23/2023   10:15 AM 03/19/2023   10:56 AM 01/20/2023    2:54 PM 12/02/2022    3:14 PM  Fall Risk   Falls in the past year? 0 0 0 0 0  Number falls in past yr: 0 0 0 0 0  Injury with Fall? 0 0 0 0 0  Risk for fall due to : No Fall Risks No Fall Risks No Fall Risks No Fall Risks No Fall Risks  Follow up Falls prevention discussed;Falls evaluation completed Falls evaluation completed Falls evaluation completed Falls evaluation completed Falls prevention discussed;Falls evaluation completed    MEDICARE RISK AT HOME:   TIMED UP AND GO:  Was the test performed?  No    Cognitive Function:    08/23/2020   10:50 AM  MMSE - Mini Mental State Exam  Orientation to time 5  Orientation to Place 5  Registration 3  Attention/ Calculation 5  Recall 3  Language- repeat 1        07/21/2023    3:08 PM 12/02/2022    3:19 PM  6CIT Screen  What Year? 0 points 0 points  What month? 0 points 0 points  What time? 0 points 0 points  Count back from 20 0 points 0 points  Months in reverse 0 points 0 points  Repeat phrase 2 points 0 points  Total Score 2 points 0 points    Immunizations Immunization History  Administered Date(s) Administered   Fluad Quad(high Dose 65+) 09/16/2022   Influenza,inj,Quad PF,6+ Mos 01/16/2015, 11/23/2017, 01/18/2019, 10/30/2019   PFIZER(Purple Top)SARS-COV-2 Vaccination 02/26/2020, 03/18/2020, 04/27/2020   PNEUMOCOCCAL CONJUGATE-20 05/30/2021   Pneumococcal Conjugate-13 08/23/2020   Pneumococcal Polysaccharide-23 03/30/2016   Td 03/30/2016   Tdap 02/24/2006   Zoster Recombinant(Shingrix) 05/30/2021, 08/23/2021   Zoster, Live 01/16/2015    TDAP status: Up to date  Flu  Vaccine status: Up to date  Pneumococcal vaccine status: Up to date  Covid-19 vaccine status: Information provided on how to obtain vaccines.   Qualifies for Shingles Vaccine? Yes   Zostavax completed Yes   Shingrix Completed?: Yes  Screening Tests Health Maintenance  Topic Date Due   COVID-19 Vaccine (4 - 2023-24 season) 08/28/2022   Diabetic kidney evaluation - Urine ACR  05/07/2023   INFLUENZA VACCINE  07/29/2023   OPHTHALMOLOGY EXAM  08/22/2023   HEMOGLOBIN A1C  10/23/2023   FOOT EXAM  01/21/2024   Diabetic kidney evaluation - eGFR measurement  03/18/2024   MAMMOGRAM  05/25/2024   Medicare Annual Wellness (AWV)  07/20/2024   DTaP/Tdap/Td (3 - Td or Tdap) 03/30/2026   Colonoscopy  04/01/2026   Pneumonia Vaccine 39+ Years old  Completed   DEXA SCAN  Completed   Hepatitis C Screening  Completed   Zoster Vaccines- Shingrix  Completed   HPV VACCINES  Aged Out    Health Maintenance  Health Maintenance Due  Topic Date Due  COVID-19 Vaccine (4 - 2023-24 season) 08/28/2022   Diabetic kidney evaluation - Urine ACR  05/07/2023    Colorectal cancer screening: Type of screening: Colonoscopy. Completed 04/01/16. Repeat every 10 years  Mammogram status: Completed 05/26/23. Repeat every year  Bone Density status: Completed 03/25/22. Results reflect: Bone density results: OSTEOPENIA. Repeat every 2 years.  Lung Cancer Screening: (Low Dose CT Chest recommended if Age 64-80 years, 20 pack-year currently smoking OR have quit w/in 15years.) does not qualify.   Lung Cancer Screening Referral: n/a  Additional Screening:  Hepatitis C Screening: does qualify; Completed 11/23/17  Vision Screening: Recommended annual ophthalmology exams for early detection of glaucoma and other disorders of the eye. Is the patient up to date with their annual eye exam?  Yes  Who is the provider or what is the name of the office in which the patient attends annual eye exams? [facility] If pt is not  established with a provider, would they like to be referred to a provider to establish care? Yes .   Dental Screening: Recommended annual dental exams for proper oral hygiene  Diabetic Foot Exam: Diabetic Foot Exam: Completed 01/20/23  Community Resource Referral / Chronic Care Management: CRR required this visit?  No   CCM required this visit?  No     Plan:     I have personally reviewed and noted the following in the patient's chart:   Medical and social history Use of alcohol, tobacco or illicit drugs  Current medications and supplements including opioid prescriptions. Patient is not currently taking opioid prescriptions. Functional ability and status Nutritional status Physical activity Advanced directives List of other physicians Hospitalizations, surgeries, and ER visits in previous 12 months Vitals Screenings to include cognitive, depression, and falls Referrals and appointments  In addition, I have reviewed and discussed with patient certain preventive protocols, quality metrics, and best practice recommendations. A written personalized care plan for preventive services as well as general preventive health recommendations were provided to patient.     Maryan Puls, LPN   [id number]   After Visit Summary: (MyChart) Due to this being a telephonic visit, the after visit summary with patients personalized plan was offered to patient via MyChart   Nurse Notes: None

## 2023-07-23 ENCOUNTER — Ambulatory Visit (INDEPENDENT_AMBULATORY_CARE_PROVIDER_SITE_OTHER): Payer: Medicare HMO | Admitting: Primary Care

## 2023-07-23 ENCOUNTER — Encounter: Payer: Self-pay | Admitting: Primary Care

## 2023-07-23 VITALS — BP 128/72 | HR 76 | Temp 98.1°F | Ht 61.0 in | Wt 190.0 lb

## 2023-07-23 DIAGNOSIS — E1165 Type 2 diabetes mellitus with hyperglycemia: Secondary | ICD-10-CM

## 2023-07-23 DIAGNOSIS — Z7984 Long term (current) use of oral hypoglycemic drugs: Secondary | ICD-10-CM | POA: Diagnosis not present

## 2023-07-23 LAB — MICROALBUMIN / CREATININE URINE RATIO
Creatinine,U: 47.8 mg/dL
Microalb Creat Ratio: 1.5 mg/g (ref 0.0–30.0)
Microalb, Ur: <0.7 mg/dL (ref 0.0–1.9)

## 2023-07-23 LAB — POCT GLYCOSYLATED HEMOGLOBIN (HGB A1C): Hemoglobin A1C: 5.8 % — AB (ref 4.0–5.6)

## 2023-07-23 NOTE — Patient Instructions (Addendum)
Continue Mounjaro 7.5 mg weekly for diabetes.  Stop by the lab prior to leaving today. I will notify you of your results once received.   Please schedule a physical to meet with me in 6 months.   It was a pleasure to see you today!

## 2023-07-23 NOTE — Assessment & Plan Note (Signed)
Controlled and improved with A1C of 5.8 today!!!  Commended her on weight loss.  Remain off insulin.  Continue Mounjaro 7.5 mg weekly.  Urine microalbumin due and pending.  Follow up in 6 months.

## 2023-07-23 NOTE — Progress Notes (Signed)
Subjective:    Patient ID: Valerie Gordon, female    DOB: Sep 07, 1951, 72 y.o.   MRN: [id number]  HPI  Valerie Gordon is a very pleasant 72 y.o. female with a history of type 2 diabetes, hypothyroidism, depression, hyperlipidemia, breast cancer who presents today for follow-up of diabetes.  Current medications include: Mounjaro 7.5 mg weekly.   She is checking her blood glucose 1 times daily and is getting readings of:  AM fasting: 110's-130's  Last A1C: 7.8 in April 2024, 5.8 today Last Eye Exam: Up-to-date Last Foot Exam: Up-to-date Pneumonia Vaccination: 2021 Urine Microalbumin: Due Statin: Rosuvastatin  Dietary changes since last visit: Smaller portion sizes, reduced appetite. Mostly frozen dinners for dinner, sandwich for lunch.    Exercise: None. She is getting 3000 steps per day.  Wt Readings from Last 3 Encounters:  07/23/23 190 lb (86.2 kg)  07/21/23 188 lb (85.3 kg)  07/14/23 193 lb (87.5 kg)     Review of Systems  Respiratory:  Negative for shortness of breath.   Cardiovascular:  Negative for chest pain.  Musculoskeletal:  Positive for arthralgias.  Neurological:  Negative for numbness.         Past Medical History:  Diagnosis Date   Acute diarrhea 05/14/2021   Arthritis    lumbar spine   Breast cancer (HCC)    Cataract    COVID-19 virus infection 10/14/2021   Depression    Gallstones    asyptomatic   GERD (gastroesophageal reflux disease)    Glaucoma    Headache    h/o migraines   Hyperlipidemia    Hypothyroidism    Obesity    Thyroid disease    Type 2 diabetes mellitus (HCC)    manages with diet and exercise    Social History   Socioeconomic History   Marital status: Widowed    Spouse name: Not on file   Number of children: Not on file   Years of education: Not on file   Highest education level: Not on file  Occupational History   Not on file  Tobacco Use   Smoking status: Never   Smokeless tobacco: Never  Vaping Use    Vaping status: Never Used  Substance and Sexual Activity   Alcohol use: Yes    Comment: occasional wine   Drug use: No   Sexual activity: Yes  Other Topics Concern   Not on file  Social History Narrative   Widower.   Step children.   Librarian, academic.   Enjoys riding hot air balloons, spending time with friends, reading, jewelry   Social Determinants of Health   Financial Resource Strain: Patient Declined (07/21/2023)   Overall Financial Resource Strain (CARDIA)    Difficulty of Paying Living Expenses: Patient declined  Food Insecurity: No Food Insecurity (07/21/2023)   Hunger Vital Sign    Worried About Running Out of Food in the Last Year: Never true    Ran Out of Food in the Last Year: Never true  Transportation Needs: No Transportation Needs (07/21/2023)   PRAPARE - Administrator, Civil Service (Medical): No    Lack of Transportation (Non-Medical): No  Physical Activity: Insufficiently Active (07/21/2023)   Exercise Vital Sign    Days of Exercise per Week: 7 days    Minutes of Exercise per Session: 10 min  Stress: Patient Declined (07/21/2023)   [employer] of Occupational Health - Occupational Stress Questionnaire    Feeling of Stress : Patient declined  Social Connections: Moderately Isolated (07/21/2023)   Social Connection and Isolation Panel [NHANES]    Frequency of Communication with Friends and Family: More than three times a week    Frequency of Social Gatherings with Friends and Family: More than three times a week    Attends Religious Services: Never    Database administrator or Organizations: Yes    Attends Engineer, structural: More than 4 times per year    Marital Status: Widowed  Intimate Partner Violence: Not At Risk (07/21/2023)   Humiliation, Afraid, Rape, and Kick questionnaire    Fear of Current or Ex-Partner: No    Emotionally Abused: No    Physically Abused: No    Sexually Abused: No    Past Surgical History:   Procedure Laterality Date   AXILLARY SENTINEL NODE BIOPSY Right 06/17/2022   Procedure: AXILLARY SENTINEL NODE BIOPSY;  Surgeon: Earline Mayotte, MD;  Location: [facility] ORS;  Service: General;  Laterality: Right;   BREAST BIOPSY  12/28/2008   BREAST BIOPSY Right 04/28/2022   [place] core bx 11:00 coil clip -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   BREAST LUMPECTOMY WITH NEEDLE LOCALIZATION Bilateral 06/17/2022   Procedure: BREAST LUMPECTOMY WITH NEEDLE LOCALIZATION;  Surgeon: Earline Mayotte, MD;  Location: [facility] ORS;  Service: General;  Laterality: Bilateral;   CARDIAC CATHETERIZATION  12/28/1996   and a glaucoma procedure as well that has a metal shunt in left eye   COLONOSCOPY  10/30/2004   COLONOSCOPY WITH PROPOFOL N/A 04/01/2016   Procedure: COLONOSCOPY WITH PROPOFOL;  Surgeon: Earline Mayotte, MD;  Location: [facility] ENDOSCOPY;  Service: Endoscopy;  Laterality: N/A;   EYE SURGERY Bilateral    cataract sx   TONSILLECTOMY     WISDOM TOOTH EXTRACTION  1983    Family History  Problem Relation Age of Onset   Stroke Mother    Heart disease Father    Lung cancer Maternal Grandmother    Stomach cancer Paternal Grandmother    Breast cancer Neg Hx     Allergies  Allergen Reactions   Ciprofloxacin Swelling    At the injection site   Trulicity [Dulaglutide]     Itch at inj site   Antihistamines, Chlorpheniramine-Type Palpitations    Current Outpatient Medications on File Prior to Visit  Medication Sig Dispense Refill   anastrozole (ARIMIDEX) 1 MG tablet Take 1 tablet (1 mg total) by mouth daily. 90 tablet 1   b complex vitamins tablet Take 1 tablet by mouth daily.     BIOTIN PO Take 5 mg by mouth daily.     Cholecalciferol 50 MCG (2000 UT) CAPS Take 1 capsule by mouth daily after lunch.     Continuous Glucose Sensor (DEXCOM G7 SENSOR) MISC Apply  every 10 days to check blood sugars. 3 each 3   famotidine (PEPCID) 20 MG tablet Take 20 mg by mouth at bedtime.     Fish Oil-Krill Oil (KRILL OIL PLUS PO) Take 1 tablet by mouth daily at 6 (six) AM.     fluticasone (FLONASE) 50 MCG/ACT nasal spray Place into both nostrils daily as needed for allergies or rhinitis.     Insulin Pen Needle (B-D ULTRAFINE III SHORT PEN) 31G X 8 MM MISC USE WITH INSULIN NIGHTLY 100 each 1   latanoprost (XALATAN) 0.005 % ophthalmic solution Place 1 drop into both eyes every  morning.     levothyroxine (SYNTHROID) 100 MCG tablet TAKE 1 TABLET BY MOUTH IN THE MORNING ON  AN  EMPTY  STOMACH  WITH  WATER  ONLY  (NO  FOOD  OR  OTHER  MEDICATIONS  FOR  30  MINUTES) 90 tablet 2   Magnesium 200 MG TABS Take 1 tablet by mouth at bedtime.     rosuvastatin (CRESTOR) 10 MG tablet TAKE 1 TABLET BY MOUTH ONCE DAILY FOR CHOLESTEROL 90 tablet 2   tirzepatide (MOUNJARO) 7.5 MG/0.5ML Pen Inject 7.5 mg into the skin once a week. for diabetes. 6 mL 0   VITAMIN A PO Take 2,400 mcg by mouth daily at 6 (six) AM.     No current facility-administered medications on file prior to visit.    BP 128/72   Pulse 76   Temp 98.1 F (36.7 C) (Temporal)   Ht 5\' 1"  (1.549 m)   Wt 190 lb (86.2 kg)   SpO2 98%   BMI 35.90 kg/m  Objective:   Physical Exam Cardiovascular:     Rate and Rhythm: Normal rate and regular rhythm.  Pulmonary:     Effort: Pulmonary effort is normal.     Breath sounds: Normal breath sounds.  Musculoskeletal:     Cervical back: Neck supple.  Skin:    General: Skin is warm and dry.           Assessment & Plan:  Type 2 diabetes mellitus with hyperglycemia, unspecified whether long term insulin use (HCC) Assessment & Plan: Controlled and improved with A1C of 5.8 today!!!  Commended her on weight loss.  Remain off insulin.  Continue Mounjaro 7.5 mg weekly.  Urine microalbumin due and pending.  Follow up in 6 months.   Orders: -     Microalbumin /  creatinine urine ratio -     POCT glycosylated hemoglobin (Hb A1C)        Doreene Nest, NP

## 2023-08-18 DIAGNOSIS — E1165 Type 2 diabetes mellitus with hyperglycemia: Secondary | ICD-10-CM

## 2023-08-19 DIAGNOSIS — H401133 Primary open-angle glaucoma, bilateral, severe stage: Secondary | ICD-10-CM | POA: Diagnosis not present

## 2023-08-19 DIAGNOSIS — Z961 Presence of intraocular lens: Secondary | ICD-10-CM | POA: Diagnosis not present

## 2023-08-19 DIAGNOSIS — H524 Presbyopia: Secondary | ICD-10-CM | POA: Diagnosis not present

## 2023-08-19 DIAGNOSIS — E119 Type 2 diabetes mellitus without complications: Secondary | ICD-10-CM | POA: Diagnosis not present

## 2023-08-19 LAB — HM DIABETES EYE EXAM

## 2023-08-19 NOTE — Telephone Encounter (Signed)
Have sent message to patient that you are out of the office until 08/31/23

## 2023-08-20 MED ORDER — FREESTYLE LIBRE 3 SENSOR MISC
1 refills | Status: DC
Start: 2023-08-20 — End: 2024-07-12

## 2023-09-29 DIAGNOSIS — E1165 Type 2 diabetes mellitus with hyperglycemia: Secondary | ICD-10-CM

## 2023-09-30 MED ORDER — TIRZEPATIDE 7.5 MG/0.5ML ~~LOC~~ SOAJ
7.5000 mg | SUBCUTANEOUS | 0 refills | Status: DC
Start: 2023-09-30 — End: 2024-01-05

## 2023-10-15 NOTE — Telephone Encounter (Signed)
Patient called in to follow up on this. She stated that the cough is getting worse and she has no refills left. Please advise. Thank you!

## 2023-12-03 ENCOUNTER — Encounter: Payer: Self-pay | Admitting: Primary Care

## 2023-12-03 NOTE — Telephone Encounter (Signed)
 Care team updated and letter sent for eye exam notes.

## 2023-12-06 ENCOUNTER — Ambulatory Visit (INDEPENDENT_AMBULATORY_CARE_PROVIDER_SITE_OTHER): Payer: Medicare HMO

## 2023-12-06 VITALS — Ht 61.0 in | Wt 181.0 lb

## 2023-12-06 DIAGNOSIS — Z0000 Encounter for general adult medical examination without abnormal findings: Secondary | ICD-10-CM

## 2023-12-06 NOTE — Progress Notes (Addendum)
Subjective:   Valerie Gordon is a 72 y.o. female who presents for Medicare Annual (Subsequent) preventive examination.  Visit Complete: Virtual I connected with  Valerie Gordon on 12/06/23 by a audio enabled telemedicine application and verified that I am speaking with the correct person using two identifiers.  Patient Location: Home  Provider Location: Office/Clinic  I discussed the limitations of evaluation and management by telemedicine. The patient expressed understanding and agreed to proceed.  Vital Signs: Because this visit was a virtual/telehealth visit, some criteria may be missing or patient reported. Any vitals not documented were not able to be obtained and vitals that have been documented are patient reported.  Patient Medicare AWV questionnaire was completed by the patient on (not done); I have confirmed that all information answered by patient is correct and no changes since this date. Cardiac Risk Factors include: advanced age (>81men, >32 women);diabetes mellitus;dyslipidemia;obesity (BMI >30kg/m2);sedentary lifestyle    Objective:    Today's Vitals   12/06/23 1024  Weight: 181 lb (82.1 kg)  Height: 5\' 1"  (1.549 m)   Body mass index is 34.2 kg/m.     12/06/2023   10:33 AM 07/21/2023    3:06 PM 07/14/2023    3:03 PM 05/13/2023    2:35 PM 12/11/2022   10:34 AM 12/02/2022    3:14 PM 09/09/2022   10:49 AM  Advanced Directives  Does Patient Have a Medical Advance Directive? Yes Yes Yes No No No Yes  Type of Estate agent of [place];Living will Healthcare Power of [place];Living will Healthcare Power of [place];Living will    Living will  Copy of Healthcare Power of Attorney in Chart? No - copy requested No - copy requested No - copy requested      Would patient like information on creating a medical advance directive?    No - Patient declined No - Patient declined No - Patient declined     Current Medications (verified) Outpatient Encounter  Medications as of 12/06/2023  Medication Sig   anastrozole (ARIMIDEX) 1 MG tablet Take 1 tablet (1 mg total) by mouth daily.   b complex vitamins tablet Take 1 tablet by mouth daily.   BIOTIN PO Take 5 mg by mouth daily.   Cholecalciferol 50 MCG (2000 UT) CAPS Take 1 capsule by mouth daily after lunch.   Continuous Glucose Sensor (FREESTYLE LIBRE 3 SENSOR) MISC Place 1 sensor on the skin every 14 days. Use to check glucose continuously   famotidine (PEPCID) 20 MG tablet Take 20 mg by mouth at bedtime.   Fish Oil-Krill Oil (KRILL OIL PLUS PO) Take 1 tablet by mouth daily at 6 (six) AM.   fluticasone (FLONASE) 50 MCG/ACT nasal spray Place into both nostrils daily as needed for allergies or rhinitis.   Insulin Pen Needle (B-D ULTRAFINE III SHORT PEN) 31G X 8 MM MISC USE WITH INSULIN NIGHTLY   latanoprost (XALATAN) 0.005 % ophthalmic solution Place 1 drop into both eyes every morning.   levothyroxine (SYNTHROID) 100 MCG tablet TAKE 1 TABLET BY MOUTH IN THE MORNING ON  AN  EMPTY  STOMACH  WITH  WATER  ONLY  (NO  FOOD  OR  OTHER  MEDICATIONS  FOR  30  MINUTES)   Magnesium 200 MG TABS Take 1 tablet by mouth at bedtime.   rosuvastatin (CRESTOR) 10 MG tablet TAKE 1 TABLET BY MOUTH ONCE DAILY FOR CHOLESTEROL   tirzepatide (MOUNJARO) 7.5 MG/0.5ML Pen Inject 7.5 mg into the skin once a week.  for diabetes.   VITAMIN A PO Take 2,400 mcg by mouth daily at 6 (six) AM.   No facility-administered encounter medications on file as of 12/06/2023.    Allergies (verified) Ciprofloxacin; Trulicity [dulaglutide]; and Antihistamines, chlorpheniramine-type   History: Past Medical History:  Diagnosis Date   Acute diarrhea 05/14/2021   Arthritis    lumbar spine   Breast cancer (HCC)    Cataract    COVID-19 virus infection 10/14/2021   Depression    Gallstones    asyptomatic   GERD (gastroesophageal reflux disease)    Glaucoma    Headache    h/o migraines   Hyperlipidemia    Hypothyroidism    Obesity     Thyroid disease    Type 2 diabetes mellitus (HCC)    manages with diet and exercise   Past Surgical History:  Procedure Laterality Date   AXILLARY SENTINEL NODE BIOPSY Right 06/17/2022   Procedure: AXILLARY SENTINEL NODE BIOPSY;  Surgeon: Earline Mayotte, MD;  Location: [facility] ORS;  Service: General;  Laterality: Right;   BREAST BIOPSY  12/28/2008   BREAST BIOPSY Right 04/28/2022   [place] core bx 11:00 coil clip -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   BREAST LUMPECTOMY WITH NEEDLE LOCALIZATION Bilateral 06/17/2022   Procedure: BREAST LUMPECTOMY WITH NEEDLE LOCALIZATION;  Surgeon: Earline Mayotte, MD;  Location: [facility] ORS;  Service: General;  Laterality: Bilateral;   CARDIAC CATHETERIZATION  12/28/1996   and a glaucoma procedure as well that has a metal shunt in left eye   COLONOSCOPY  10/30/2004   COLONOSCOPY WITH PROPOFOL N/A 04/01/2016   Procedure: COLONOSCOPY WITH PROPOFOL;  Surgeon: Earline Mayotte, MD;  Location: [facility] ENDOSCOPY;  Service: Endoscopy;  Laterality: N/A;   EYE SURGERY Bilateral    cataract sx   TONSILLECTOMY     WISDOM TOOTH EXTRACTION  1983   Family History  Problem Relation Age of Onset   Stroke Mother    Heart disease Father    Lung cancer Maternal Grandmother    Stomach cancer Paternal Grandmother    Breast cancer Neg Hx    Social History   Socioeconomic History   Marital status: Widowed    Spouse name: Not on file   Number of children: Not on file   Years of education: Not on file   Highest education level: Not on file  Occupational History   Not on file  Tobacco Use   Smoking status: Never   Smokeless tobacco: Never  Vaping Use   Vaping status: Never Used  Substance and Sexual Activity   Alcohol use: Yes    Comment: occasional wine   Drug use: No   Sexual activity: Yes  Other Topics Concern   Not on  file  Social History Narrative   Widower.   Step children.   Librarian, academic.   Enjoys riding hot air balloons, spending time with friends, reading, jewelry   Social Determinants of Health   Financial Resource Strain: Low Risk  (12/06/2023)   Overall Financial Resource Strain (CARDIA)    Difficulty of Paying Living Expenses: Not hard at all  Food Insecurity: No Food Insecurity (12/06/2023)   Hunger Vital Sign    Worried About Running Out of Food in the Last Year: Never true    Ran Out of Food in the Last Year: Never true  Transportation Needs: No  Transportation Needs (12/06/2023)   PRAPARE - Administrator, Civil Service (Medical): No    Lack of Transportation (Non-Medical): No  Physical Activity: Inactive (12/06/2023)   Exercise Vital Sign    Days of Exercise per Week: 0 days    Minutes of Exercise per Session: 0 min  Stress: No Stress Concern Present (12/06/2023)   [employer] of Occupational Health - Occupational Stress Questionnaire    Feeling of Stress : Not at all  Social Connections: Moderately Isolated (12/06/2023)   Social Connection and Isolation Panel [NHANES]    Frequency of Communication with Friends and Family: More than three times a week    Frequency of Social Gatherings with Friends and Family: More than three times a week    Attends Religious Services: Never    Database administrator or Organizations: Yes    Attends Engineer, structural: More than 4 times per year    Marital Status: Widowed    Tobacco Counseling Counseling given: Not Answered   Clinical Intake:  Pre-visit preparation completed: No  Pain : No/denies pain    BMI - recorded: 34.2 Nutritional Status: BMI > 30  Obese Nutritional Risks: None Diabetes: Yes CBG done?: No Did pt. bring in CBG monitor from home?: No  How often do you need to have someone help you when you read instructions, pamphlets, or other written materials from your doctor or  pharmacy?: 1 - Never  Interpreter Needed?: No  Comments: lives alone Information entered by :: B.Grethel Zenk,LPN   Activities of Daily Living    12/06/2023   10:33 AM 07/21/2023    9:34 AM  In your present state of health, do you have any difficulty performing the following activities:  Hearing? 0 0  Vision? 0 0  Difficulty concentrating or making decisions? 0 0  Walking or climbing stairs? 0 0  Dressing or bathing? 0 0  Doing errands, shopping? 0 0  Preparing Food and eating ? N N  Using the Toilet? N N  In the past six months, have you accidently leaked urine? N N  Do you have problems with loss of bowel control? N N  Managing your Medications? N N  Managing your Finances? N N  Housekeeping or managing your Housekeeping? N N    Patient Care Team: Doreene Nest, NP as PCP - General (Internal Medicine) Arvil Chaco, Belia Heman, MD as Consulting Physician (Obstetrics and Gynecology) Lemar Livings Merrily Pew, MD (General Surgery) Linna Darner, RD as Dietitian (Family Medicine) Scarlett Presto, RN (Inactive) as Oncology Nurse Navigator Hulen Luster, RN as Oncology Nurse Navigator Earna Coder, MD as Consulting Physician (Oncology) Carmina Miller, MD as Consulting Physician (Radiation Oncology) [facility], [facility] (Optometry)  Indicate any recent Medical Services you may have received from other than [facility] providers in the past year (date may be approximate).     Assessment:   This is a routine wellness examination for Valerie Gordon.  Hearing/Vision screen Hearing Screening - Comments:: Pt says her hearing is good Vision Screening - Comments:: Pt says her vision is alright with glasses [facility]   Goals Addressed             This Visit's Progress    COMPLETED: DIET - EAT MORE FRUITS AND VEGETABLES   On track    Patient Stated   On track    08/23/2020, I will continue to work with my dietician to help me lose 30-40 lbs total.  Patient Stated   On track    Would  like to lose weight and increase energy.     Patient Stated   On track    Lose 10 lbs.       Depression Screen    12/06/2023   10:31 AM 07/23/2023   11:51 AM 07/21/2023    3:05 PM 04/23/2023   10:16 AM 03/19/2023   10:57 AM 01/20/2023    2:54 PM 12/02/2022    3:12 PM  PHQ 2/9 Scores  PHQ - 2 Score 0 0 0 0 0 0 0  PHQ- 9 Score  0  0 0  0    Fall Risk    12/06/2023   10:27 AM 07/23/2023   11:51 AM 07/21/2023    9:34 AM 04/23/2023   10:15 AM 03/19/2023   10:56 AM  Fall Risk   Falls in the past year? 0 0 0 0 0  Number falls in past yr:  0 0 0 0  Injury with Fall?  0 0 0 0  Risk for fall due to : No Fall Risks No Fall Risks No Fall Risks No Fall Risks No Fall Risks  Follow up Falls prevention discussed;Education provided Falls evaluation completed Falls prevention discussed;Falls evaluation completed Falls evaluation completed Falls evaluation completed    MEDICARE RISK AT HOME: Medicare Risk at Home Any stairs in or around the home?: Yes If so, are there any without handrails?: Yes Home free of loose throw rugs in walkways, pet beds, electrical cords, etc?: Yes Adequate lighting in your home to reduce risk of falls?: Yes Life alert?: No Use of a cane, walker or w/c?: No Grab bars in the bathroom?: Yes Shower chair or bench in shower?: Yes Elevated toilet seat or a handicapped toilet?: No  TIMED UP AND GO:  Was the test performed?  No    Cognitive Function:    08/23/2020   10:50 AM  MMSE - Mini Mental State Exam  Orientation to time 5  Orientation to Place 5  Registration 3  Attention/ Calculation 5  Recall 3  Language- repeat 1        12/06/2023   10:34 AM 07/21/2023    3:08 PM 12/02/2022    3:19 PM  6CIT Screen  What Year? 0 points 0 points 0 points  What month? 0 points 0 points 0 points  What time? 0 points 0 points 0 points  Count back from 20 0 points 0 points 0 points  Months in reverse 0 points 0 points 0 points  Repeat phrase 0 points 2 points 0 points   Total Score 0 points 2 points 0 points    Immunizations Immunization History  Administered Date(s) Administered   Fluad Quad(high Dose 65+) 09/16/2022   Influenza,inj,Quad PF,6+ Mos 01/16/2015, 11/23/2017, 01/18/2019, 10/30/2019   PFIZER(Purple Top)SARS-COV-2 Vaccination 02/26/2020, 03/18/2020, 04/27/2020   PNEUMOCOCCAL CONJUGATE-20 05/30/2021   Pneumococcal Conjugate-13 08/23/2020   Pneumococcal Polysaccharide-23 03/30/2016   Td 03/30/2016   Tdap 02/24/2006   Zoster Recombinant(Shingrix) 05/30/2021, 08/23/2021   Zoster, Live 01/16/2015    TDAP status: Up to date  Flu Vaccine status: Due, Education has been provided regarding the importance of this vaccine. Advised may receive this vaccine at local pharmacy or Health Dept. Aware to provide a copy of the vaccination record if obtained from local pharmacy or Health Dept. Verbalized acceptance and understanding.  Pneumococcal vaccine status: Up to date  Covid-19 vaccine status: Completed vaccines  Qualifies for Shingles Vaccine? Yes  Zostavax completed Yes   Shingrix Completed?: Yes  Screening Tests Health Maintenance  Topic Date Due   COVID-19 Vaccine (4 - 2023-24 season) 12/22/2023 (Originally 08/29/2023)   INFLUENZA VACCINE  03/27/2024 (Originally 07/29/2023)   FOOT EXAM  01/21/2024   HEMOGLOBIN A1C  01/23/2024   Diabetic kidney evaluation - eGFR measurement  03/18/2024   MAMMOGRAM  05/25/2024   Diabetic kidney evaluation - Urine ACR  07/22/2024   OPHTHALMOLOGY EXAM  08/18/2024   Medicare Annual Wellness (AWV)  12/05/2024   DTaP/Tdap/Td (3 - Td or Tdap) 03/30/2026   Colonoscopy  04/01/2026   Pneumonia Vaccine 47+ Years old  Completed   DEXA SCAN  Completed   Hepatitis C Screening  Completed   Zoster Vaccines- Shingrix  Completed   HPV VACCINES  Aged Out    Health Maintenance  There are no preventive care reminders to display for this patient.   Colorectal cancer screening: Type of screening: Colonoscopy.  Completed 04/01/2016. Repeat every 10 years  Mammogram status: Completed 05/26/23. Repeat every year  Bone Density status: Completed 03/25/22. Results reflect: Bone density results: NORMAL. Repeat every 5 years.  Lung Cancer Screening: (Low Dose CT Chest recommended if Age 17-80 years, 20 pack-year currently smoking OR have quit w/in 15years.) does not qualify.   Lung Cancer Screening Referral: no  Additional Screening:  Hepatitis C Screening: does not qualify; Completed 11/23/2017  Vision Screening: Recommended annual ophthalmology exams for early detection of glaucoma and other disorders of the eye. Is the patient up to date with their annual eye exam?  Yes  Who is the provider or what is the name of the office in which the patient attends annual eye exams? [facility] If pt is not established with a provider, would they like to be referred to a provider to establish care? No .   Dental Screening: Recommended annual dental exams for proper oral hygiene  Diabetic Foot Exam: Diabetic Foot Exam: Completed 07/23/2023  Community Resource Referral / Chronic Care Management: CRR required this visit?  No   CCM required this visit?  No    Plan:     I have personally reviewed and noted the following in the patient's chart:   Medical and social history Use of alcohol, tobacco or illicit drugs  Current medications and supplements including opioid prescriptions. Patient is not currently taking opioid prescriptions. Functional ability and status Nutritional status Physical activity Advanced directives List of other physicians Hospitalizations, surgeries, and ER visits in previous 12 months Vitals Screenings to include cognitive, depression, and falls Referrals and appointments  In addition, I have reviewed and discussed with patient certain preventive protocols, quality metrics, and best practice recommendations. A written personalized care plan for preventive services as well as  general preventive health recommendations were provided to patient.    Sue Lush, LPN   16/12/958   After Visit Summary: (MyChart) Due to this being a telephonic visit, the after visit summary with patients personalized plan was offered to patient via MyChart   Nurse Notes: The patient states she is doing well and has no concerns or questions at this time.

## 2023-12-06 NOTE — Patient Instructions (Signed)
Valerie Gordon , Thank you for taking time to come for your Medicare Wellness Visit. I appreciate your ongoing commitment to your health goals. Please review the following plan we discussed and let me know if I can assist you in the future.   Referrals/Orders/Follow-Ups/Clinician Recommendations: none  This is a list of the screening recommended for you and due dates:  Health Maintenance  Topic Date Due   Flu Shot  07/29/2023   Eye exam for diabetics  08/22/2023   COVID-19 Vaccine (4 - 2023-24 season) 08/29/2023   Complete foot exam   01/21/2024   Hemoglobin A1C  01/23/2024   Yearly kidney function blood test for diabetes  03/18/2024   Mammogram  05/25/2024   Yearly kidney health urinalysis for diabetes  07/22/2024   Medicare Annual Wellness Visit  12/05/2024   DTaP/Tdap/Td vaccine (3 - Td or Tdap) 03/30/2026   Colon Cancer Screening  04/01/2026   Pneumonia Vaccine  Completed   DEXA scan (bone density measurement)  Completed   Hepatitis C Screening  Completed   Zoster (Shingles) Vaccine  Completed   HPV Vaccine  Aged Out    Advanced directives: (Copy Requested) Please bring a copy of your health care power of attorney and living will to the office to be added to your chart at your convenience.  Next Medicare Annual Wellness Visit scheduled for next year: Yes 12/06/24 @ 10:50 am telephone

## 2023-12-15 ENCOUNTER — Other Ambulatory Visit: Payer: Self-pay | Admitting: Primary Care

## 2023-12-15 DIAGNOSIS — E785 Hyperlipidemia, unspecified: Secondary | ICD-10-CM

## 2023-12-30 ENCOUNTER — Telehealth: Payer: Self-pay | Admitting: Primary Care

## 2023-12-30 NOTE — Telephone Encounter (Signed)
 Copied from CRM [id number]. Topic: General - Billing Inquiry >> Dec 30, 2023 11:07 AM Valerie Gordon wrote: Reason for CRM: Patient states she got billed for her telephone visit on 12/9 as an office visit  & they're charging her $328.00.

## 2024-01-04 ENCOUNTER — Other Ambulatory Visit: Payer: Self-pay | Admitting: Primary Care

## 2024-01-04 DIAGNOSIS — E1165 Type 2 diabetes mellitus with hyperglycemia: Secondary | ICD-10-CM

## 2024-01-04 NOTE — Telephone Encounter (Signed)
 Sent inquiry to [facility] for review

## 2024-01-14 ENCOUNTER — Inpatient Hospital Stay: Payer: Medicare HMO | Attending: Internal Medicine

## 2024-01-14 ENCOUNTER — Inpatient Hospital Stay (HOSPITAL_BASED_OUTPATIENT_CLINIC_OR_DEPARTMENT_OTHER): Payer: Medicare HMO | Admitting: Internal Medicine

## 2024-01-14 ENCOUNTER — Encounter: Payer: Self-pay | Admitting: Internal Medicine

## 2024-01-14 VITALS — BP 148/53 | HR 72 | Temp 98.2°F | Ht 61.0 in | Wt 186.4 lb

## 2024-01-14 DIAGNOSIS — M545 Low back pain, unspecified: Secondary | ICD-10-CM | POA: Diagnosis not present

## 2024-01-14 DIAGNOSIS — C50411 Malignant neoplasm of upper-outer quadrant of right female breast: Secondary | ICD-10-CM | POA: Insufficient documentation

## 2024-01-14 DIAGNOSIS — Z1721 Progesterone receptor positive status: Secondary | ICD-10-CM | POA: Insufficient documentation

## 2024-01-14 DIAGNOSIS — N183 Chronic kidney disease, stage 3 unspecified: Secondary | ICD-10-CM | POA: Insufficient documentation

## 2024-01-14 DIAGNOSIS — Z800 Family history of malignant neoplasm of digestive organs: Secondary | ICD-10-CM | POA: Insufficient documentation

## 2024-01-14 DIAGNOSIS — Z170 Estrogen receptor positive status [ER+]: Secondary | ICD-10-CM

## 2024-01-14 DIAGNOSIS — R5383 Other fatigue: Secondary | ICD-10-CM | POA: Insufficient documentation

## 2024-01-14 DIAGNOSIS — Z1732 Human epidermal growth factor receptor 2 negative status: Secondary | ICD-10-CM | POA: Insufficient documentation

## 2024-01-14 DIAGNOSIS — R232 Flushing: Secondary | ICD-10-CM | POA: Diagnosis not present

## 2024-01-14 DIAGNOSIS — E1122 Type 2 diabetes mellitus with diabetic chronic kidney disease: Secondary | ICD-10-CM | POA: Diagnosis not present

## 2024-01-14 DIAGNOSIS — G4700 Insomnia, unspecified: Secondary | ICD-10-CM | POA: Diagnosis not present

## 2024-01-14 DIAGNOSIS — Z7985 Long-term (current) use of injectable non-insulin antidiabetic drugs: Secondary | ICD-10-CM | POA: Diagnosis not present

## 2024-01-14 DIAGNOSIS — Z79811 Long term (current) use of aromatase inhibitors: Secondary | ICD-10-CM | POA: Diagnosis not present

## 2024-01-14 DIAGNOSIS — E669 Obesity, unspecified: Secondary | ICD-10-CM | POA: Diagnosis not present

## 2024-01-14 DIAGNOSIS — M8580 Other specified disorders of bone density and structure, unspecified site: Secondary | ICD-10-CM | POA: Insufficient documentation

## 2024-01-14 DIAGNOSIS — Z801 Family history of malignant neoplasm of trachea, bronchus and lung: Secondary | ICD-10-CM | POA: Insufficient documentation

## 2024-01-14 DIAGNOSIS — Z923 Personal history of irradiation: Secondary | ICD-10-CM | POA: Diagnosis not present

## 2024-01-14 DIAGNOSIS — G8929 Other chronic pain: Secondary | ICD-10-CM | POA: Insufficient documentation

## 2024-01-14 LAB — CBC WITH DIFFERENTIAL (CANCER CENTER ONLY)
Abs Immature Granulocytes: 0.03 10*3/uL (ref 0.00–0.07)
Basophils Absolute: 0.1 10*3/uL (ref 0.0–0.1)
Basophils Relative: 1 %
Eosinophils Absolute: 0.3 10*3/uL (ref 0.0–0.5)
Eosinophils Relative: 3 %
HCT: 45.2 % (ref 36.0–46.0)
Hemoglobin: 15.1 g/dL — ABNORMAL HIGH (ref 12.0–15.0)
Immature Granulocytes: 0 %
Lymphocytes Relative: 23 %
Lymphs Abs: 2.1 10*3/uL (ref 0.7–4.0)
MCH: 30.8 pg (ref 26.0–34.0)
MCHC: 33.4 g/dL (ref 30.0–36.0)
MCV: 92.1 fL (ref 80.0–100.0)
Monocytes Absolute: 0.7 10*3/uL (ref 0.1–1.0)
Monocytes Relative: 8 %
Neutro Abs: 5.7 10*3/uL (ref 1.7–7.7)
Neutrophils Relative %: 65 %
Platelet Count: 274 10*3/uL (ref 150–400)
RBC: 4.91 MIL/uL (ref 3.87–5.11)
RDW: 13.2 % (ref 11.5–15.5)
WBC Count: 8.8 10*3/uL (ref 4.0–10.5)
nRBC: 0 % (ref 0.0–0.2)

## 2024-01-14 LAB — CMP (CANCER CENTER ONLY)
ALT: 11 U/L (ref 0–44)
AST: 24 U/L (ref 15–41)
Albumin: 4.3 g/dL (ref 3.5–5.0)
Alkaline Phosphatase: 87 U/L (ref 38–126)
Anion gap: 8 (ref 5–15)
BUN: 12 mg/dL (ref 8–23)
CO2: 26 mmol/L (ref 22–32)
Calcium: 9.8 mg/dL (ref 8.9–10.3)
Chloride: 102 mmol/L (ref 98–111)
Creatinine: 1.54 mg/dL — ABNORMAL HIGH (ref 0.44–1.00)
GFR, Estimated: 36 mL/min — ABNORMAL LOW (ref >60)
Glucose, Bld: 101 mg/dL — ABNORMAL HIGH (ref 70–99)
Potassium: 3.9 mmol/L (ref 3.5–5.1)
Sodium: 136 mmol/L (ref 135–145)
Total Bilirubin: 0.5 mg/dL (ref 0.0–1.2)
Total Protein: 7.7 g/dL (ref 6.5–8.1)

## 2024-01-14 LAB — VITAMIN D 25 HYDROXY (VIT D DEFICIENCY, FRACTURES): Vit D, 25-Hydroxy: 39.26 ng/mL (ref 30–100)

## 2024-01-14 MED ORDER — ANASTROZOLE 1 MG PO TABS: 1.0000 mg | ORAL_TABLET | Freq: Every day | ORAL | 1 refills | Status: DC

## 2024-01-14 NOTE — Progress Notes (Signed)
Refill anastrozole, pended.

## 2024-01-14 NOTE — Progress Notes (Signed)
[facility] CONSULT NOTE  Patient Care Team: Doreene Nest, NP as PCP - General (Internal Medicine) Arvil Chaco, Belia Heman, MD as Consulting Physician (Obstetrics and Gynecology) Lemar Livings, Merrily Pew, MD (General Surgery) Linna Darner, RD as Dietitian (Family Medicine) Scarlett Presto, RN (Inactive) as Oncology Nurse Navigator Hulen Luster, RN as Oncology Nurse Navigator Earna Coder, MD as Consulting Physician (Oncology) Carmina Miller, MD as Consulting Physician (Radiation Oncology) Pa, [facility] (Optometry)  CHIEF COMPLAINTS/PURPOSE OF CONSULTATION: Breast cancer  #  Oncology History Overview Note  IMPRESSION: 1. 1.3 cm irregular mass with associated distortion in the right breast at 11 o'clock, 2 cm the nipple, highly suspicious for malignancy. No abnormal right axillary lymph nodes.   RECOMMENDATION: 1. Ultrasound-guided core needle biopsy of the right breast highly suspicious mass. A.  BREAST MASS, RIGHT 11:00 2 CM FN; ULTRASOUND-GUIDED BIOPSY:  - INVASIVE MAMMARY CARCINOMA WITH LOBULAR FEATURES.   Size of invasive carcinoma: 8 mm in this sample  Histologic grade of invasive carcinoma: Grade 2                       Glandular/tubular differentiation score: 3                       Nuclear pleomorphism score: 2                       Mitotic rate score: 1                       Total score: 6  Ductal carcinoma in situ: Present, intermediate grade  Lymphovascular invasion: Not identified   ER/PR/HER2: Immunohistochemistry will be performed on block A2, with  reflex to FISH for HER2 2+. The results will be reported in an addendum.   Comment:  The definitive grade will be assigned on the excisional specimen.   GROSS DESCRIPTION:  A. Labeled: Right breast 11:00 2 cm from the nipple  Received: Formalin  Time/date in fixative: Collected and placed in formalin at 8:30 AM on  04/28/2022  Cold ischemic time: Less than 1 minute  Total fixation  time: Approximately 9 hours  Core pieces: 4  Size: Ranges from 0.5-2.0 cm in length and 0.2 cm in diameter  Description: Tan cores of fibrofatty tissue  Ink color: Black  Entirely submitted in 2 cassettes with 2 cores in A1 and 2 cores in A2.   CM 04/28/2022   Final Diagnosis performed by Elijah Birk, MD.   Electronically signed  04/29/2022 1:04:41PM  The electronic signature indicates that the named Attending Pathologist  has evaluated the specimen  Technical component performed at [facility], [address] [place],  [place] [address] Lab: [phone] Dir: Jolene Schimke, MD, MMM   Professional component performed at [facility], [facility], [address] [place], [place], [place] [address] Lab: [phone]  Dir: Beryle Quant, MD   ADDENDUM:  CASE SUMMARY: BREAST BIOMARKER TESTS  Estrogen Receptor (ER) Status: POSITIVE          Percentage of cells with nuclear positivity: Greater than 90%          Average intensity of staining: Strong   Progesterone Receptor (PgR) Status: POSITIVE          Percentage of cells with nuclear positivity: Greater than 90%          Average intensity of staining: Strong  HER2 (by immunohistochemistry): NEGATIVE (Score 0)  Ki-67: Not performed   #April 2023 clinical stage I -ER/PR positive HER2 =0 invasive mammary carcinoma with lobular features.  [Dr.Byrnett] May 2023 breast MRI-right side lesion-measuring 3.4 x 1.7 x 2.2 centimeters.     #May 2023 breast MRI showed-left breast indeterminate oval 1.5 centimeter mass in the UPPER-OUTER QUADRANT LEFT.  S/p biopsy positive for ER/PR positive DCIS. S/p lumpectomy  # Oncotype recurrence score 8-low risk; no chemotherapy  # Bilateral radiation-finished mid September 2023;   # Mid-OCT 2023- START Anastrazole      Carcinoma of upper-outer quadrant of right breast in female, estrogen receptor positive (HCC)  05/06/2022 Initial Diagnosis   Carcinoma of upper-outer quadrant of right breast in  female, estrogen receptor positive (HCC)   05/06/2022 Cancer Staging   Staging form: Breast, AJCC 8th Edition - Clinical: Stage IA (cT1c, cN0, cM0, G2, ER+, PR+, HER2-) - Signed by Earna Coder, MD on 05/06/2022 Histologic grading system: 3 grade system    HISTORY OF PRESENTING ILLNESS: Ambulating independently.  Alone.   Valerie Gordon 73 y.o.  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 Patient currently on anastrozole.   Pt has hot flashes from anastrozole. Denies any breast pain or discomfort.   Chronic low back pain. Intermittent hot flashes.   Denies any excess NSAIDs. Complains of "slow" urination. No burning pain.   Review of Systems  Constitutional:  Negative for chills, diaphoresis, fever, malaise/fatigue and weight loss.  HENT:  Negative for nosebleeds and sore throat.   Eyes:  Negative for double vision.  Respiratory:  Negative for cough, hemoptysis, sputum production, shortness of breath and wheezing.   Cardiovascular:  Negative for chest pain, palpitations, orthopnea and leg swelling.  Gastrointestinal:  Negative for abdominal pain, blood in stool, constipation, diarrhea, heartburn, melena, nausea and vomiting.  Genitourinary:  Negative for dysuria, frequency and urgency.  Musculoskeletal:  Negative for back pain and joint pain.  Skin: Negative.  Negative for itching and rash.  Neurological:  Negative for dizziness, tingling, focal weakness, weakness and headaches.  Endo/Heme/Allergies:  Does not bruise/bleed easily.  Psychiatric/Behavioral:  Negative for depression. The patient is not nervous/anxious and does not have insomnia.      MEDICAL HISTORY:  Past Medical History:  Diagnosis Date   Acute diarrhea 05/14/2021   Arthritis    lumbar spine   Breast cancer (HCC)    Cataract    COVID-19 virus infection 10/14/2021    Depression    Gallstones    asyptomatic   GERD (gastroesophageal reflux disease)    Glaucoma    Headache    h/o migraines   Hyperlipidemia    Hypothyroidism    Obesity    Thyroid disease    Type 2 diabetes mellitus (HCC)    manages with diet and exercise    SURGICAL HISTORY: Past Surgical History:  Procedure Laterality Date   AXILLARY SENTINEL NODE BIOPSY Right 06/17/2022   Procedure: AXILLARY SENTINEL NODE BIOPSY;  Surgeon: Earline Mayotte, MD;  Location: [facility] ORS;  Service: General;  Laterality: Right;   BREAST BIOPSY  12/28/2008   BREAST BIOPSY Right 04/28/2022   [place] core bx 11:00 coil clip -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   BREAST LUMPECTOMY WITH NEEDLE  LOCALIZATION Bilateral 06/17/2022   Procedure: BREAST LUMPECTOMY WITH NEEDLE LOCALIZATION;  Surgeon: Earline Mayotte, MD;  Location: [facility] ORS;  Service: General;  Laterality: Bilateral;   CARDIAC CATHETERIZATION  12/28/1996   and a glaucoma procedure as well that has a metal shunt in left eye   COLONOSCOPY  10/30/2004   COLONOSCOPY WITH PROPOFOL N/A 04/01/2016   Procedure: COLONOSCOPY WITH PROPOFOL;  Surgeon: Earline Mayotte, MD;  Location: [facility] ENDOSCOPY;  Service: Endoscopy;  Laterality: N/A;   EYE SURGERY Bilateral    cataract sx   TONSILLECTOMY     WISDOM TOOTH EXTRACTION  1983    SOCIAL HISTORY: Social History   Socioeconomic History   Marital status: Widowed    Spouse name: Not on file   Number of children: Not on file   Years of education: Not on file   Highest education level: Not on file  Occupational History   Not on file  Tobacco Use   Smoking status: Never   Smokeless tobacco: Never  Vaping Use   Vaping status: Never Used  Substance and Sexual Activity   Alcohol use: Yes    Comment: occasional wine   Drug use: No   Sexual activity: Yes   Other Topics Concern   Not on file  Social History Narrative   Widower.   Step children.   Librarian, academic.   Enjoys riding hot air balloons, spending time with friends, reading, jewelry   Social Drivers of Health   Financial Resource Strain: Low Risk  (12/06/2023)   Overall Financial Resource Strain (CARDIA)    Difficulty of Paying Living Expenses: Not hard at all  Food Insecurity: No Food Insecurity (12/06/2023)   Hunger Vital Sign    Worried About Running Out of Food in the Last Year: Never true    Ran Out of Food in the Last Year: Never true  Transportation Needs: No Transportation Needs (12/06/2023)   PRAPARE - Administrator, Civil Service (Medical): No    Lack of Transportation (Non-Medical): No  Physical Activity: Inactive (12/06/2023)   Exercise Vital Sign    Days of Exercise per Week: 0 days    Minutes of Exercise per Session: 0 min  Stress: No Stress Concern Present (12/06/2023)   [employer] of Occupational Health - Occupational Stress Questionnaire    Feeling of Stress : Not at all  Social Connections: Moderately Isolated (12/06/2023)   Social Connection and Isolation Panel [NHANES]    Frequency of Communication with Friends and Family: More than three times a week    Frequency of Social Gatherings with Friends and Family: More than three times a week    Attends Religious Services: Never    Database administrator or Organizations: Yes    Attends Engineer, structural: More than 4 times per year    Marital Status: Widowed  Intimate Partner Violence: Not At Risk (12/06/2023)   Humiliation, Afraid, Rape, and Kick questionnaire    Fear of Current or Ex-Partner: No    Emotionally Abused: No    Physically Abused: No    Sexually Abused: No    FAMILY HISTORY: Family History  Problem Relation Age of Onset   Stroke Mother    Heart disease Father    Lung cancer Maternal Grandmother    Stomach cancer Paternal Grandmother    Breast  cancer Neg Hx     ALLERGIES:  is allergic to ciprofloxacin; trulicity [dulaglutide]; and antihistamines, chlorpheniramine-type.  MEDICATIONS:  Current  Outpatient Medications  Medication Sig Dispense Refill   b complex vitamins tablet Take 1 tablet by mouth daily.     BIOTIN PO Take 5 mg by mouth daily.     Cholecalciferol 50 MCG (2000 UT) CAPS Take 1 capsule by mouth daily after lunch.     Continuous Glucose Sensor (FREESTYLE LIBRE 3 SENSOR) MISC Place 1 sensor on the skin every 14 days. Use to check glucose continuously 6 each 1   famotidine (PEPCID) 20 MG tablet Take 20 mg by mouth at bedtime.     Fish Oil-Krill Oil (KRILL OIL PLUS PO) Take 1 tablet by mouth daily at 6 (six) AM.     fluticasone (FLONASE) 50 MCG/ACT nasal spray Place into both nostrils daily as needed for allergies or rhinitis.     Insulin Pen Needle (B-D ULTRAFINE III SHORT PEN) 31G X 8 MM MISC USE WITH INSULIN NIGHTLY 100 each 1   latanoprost (XALATAN) 0.005 % ophthalmic solution Place 1 drop into both eyes every morning.     levothyroxine (SYNTHROID) 100 MCG tablet TAKE 1 TABLET BY MOUTH IN THE MORNING ON  AN  EMPTY  STOMACH  WITH  WATER  ONLY  (NO  FOOD  OR  OTHER  MEDICATIONS  FOR  30  MINUTES) 90 tablet 2   Magnesium 200 MG TABS Take 1 tablet by mouth at bedtime.     MOUNJARO 7.5 MG/0.5ML Pen ADMINISTER 7.5 MG UNDER THE SKIN 1 TIME A WEEK FOR DIABETES 6 mL 0   rosuvastatin (CRESTOR) 10 MG tablet TAKE 1 TABLET BY MOUTH ONCE DAILY FOR CHOLESTEROL 90 tablet 0   VITAMIN A PO Take 2,400 mcg by mouth daily at 6 (six) AM.     anastrozole (ARIMIDEX) 1 MG tablet Take 1 tablet (1 mg total) by mouth daily. 90 tablet 1   No current facility-administered medications for this visit.      Marland Kitchen  PHYSICAL EXAMINATION: ECOG PERFORMANCE STATUS: 0 - Asymptomatic  Vitals:   01/14/24 1449  BP: (!) 148/53  Pulse: 72  Temp: 98.2 F (36.8 C)  SpO2: 99%    Filed Weights   01/14/24 1449  Weight: 186 lb 6.4 oz (84.6 kg)      Physical Exam Vitals and nursing note reviewed.  HENT:     Head: Normocephalic and atraumatic.     Mouth/Throat:     Pharynx: Oropharynx is clear.  Eyes:     Extraocular Movements: Extraocular movements intact.     Pupils: Pupils are equal, round, and reactive to light.  Cardiovascular:     Rate and Rhythm: Normal rate and regular rhythm.  Abdominal:     Palpations: Abdomen is soft.  Musculoskeletal:        General: Normal range of motion.     Cervical back: Normal range of motion.  Skin:    General: Skin is warm.  Neurological:     General: No focal deficit present.     Mental Status: She is alert and oriented to person, place, and time.  Psychiatric:        Behavior: Behavior normal.        Judgment: Judgment normal.      LABORATORY DATA:  I have reviewed the data as listed Lab Results  Component Value Date   WBC 8.8 01/14/2024   HGB 15.1 (H) 01/14/2024   HCT 45.2 01/14/2024   MCV 92.1 01/14/2024   PLT 274 01/14/2024   Recent Labs    01/20/23 1530 03/19/23 1138  01/14/24 1440  NA 134* 134* 136  K 4.2 4.5 3.9  CL 98 99 102  CO2 23 28 26   GLUCOSE 145* 237* 101*  BUN 9 10 12   CREATININE 0.90 0.93 1.54*  CALCIUM 10.2 10.1 9.8  GFRNONAA  --   --  36*  PROT 7.1  --  7.7  ALBUMIN 4.4  --  4.3  AST 34  --  24  ALT 14  --  11  ALKPHOS 73  --  87  BILITOT 0.3  --  0.5    RADIOGRAPHIC STUDIES: I have personally reviewed the radiological images as listed and agreed with the findings in the report. No results found.  ASSESSMENT & PLAN:   Carcinoma of upper-outer quadrant of right breast in female, estrogen receptor positive (HCC) # Right breast cancer stage I pT1CN0-INVASIVE MAMMARY CARCINOMA WITH DUCTAL AND LOBULAR FEATURES, 14 MM. PN=0; G-2. ?  Focal involvement of the margin by DCIS; invasive carcinoma negative margins. Oncotype-RS =8; low risk.  S/p radiation.   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   # Patient on  Anastrazole [since Nov 1st 2023]-patient tolerating well except for mild hot flashes/fatigue/insomnia. Mammogram/Dr.Cintron- MAY 2024- WNL- stable.   # CKD stage III- GFR 36- recommend increase fluids; awaiting evaluation US/urine labs- with PCP/ next week.  # Fatigue/insomnia-?  Related to anastrozole.  S/p care program- unable to complete because of conflict of schedules. .   # Hot flashes- from anastrozole G-1 monitor now. Stable.   # Osteopenia: BMD March 2023- T-score of -1.9.  Continue On ca+vit D.  Stable.   # DM/ Obesity- on Monjauro-   importance of healthy weight/and weight loss.Stable.   # DISPOSITION: # follow up in 6 months  MD; labs-cbc/cmp; vit D levels-Dr.B  Dr.Clark- [place], [place].      All questions were answered. The patient/family knows to call the clinic with any problems, questions or concerns.       Earna Coder, MD 01/14/2024 3:46 PM

## 2024-01-14 NOTE — Assessment & Plan Note (Addendum)
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 PN=0; G-2. ?  Focal involvement of the margin by DCIS; invasive carcinoma negative margins. Oncotype-RS =8; low risk.  S/p radiation.   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   # Patient on Anastrazole [since Nov 1st 2023]-patient tolerating well except for mild hot flashes/fatigue/insomnia. Mammogram/Dr.Cintron- MAY 2024- WNL- stable.   # CKD stage III- GFR 36- recommend increase fluids; awaiting evaluation US/urine labs- with PCP/ next week.  # Fatigue/insomnia-?  Related to anastrozole.  S/p care program- unable to complete because of conflict of schedules. .   # Hot flashes- from anastrozole G-1 monitor now. Stable.   # Osteopenia: BMD March 2023- T-score of -1.9.  Continue On ca+vit D.  Stable.   # DM/ Obesity- on Monjauro-   importance of healthy weight/and weight loss.Stable.   # DISPOSITION: # follow up in 6 months  MD; labs-cbc/cmp; vit D levels-Dr.B  Dr.Clark- [place], [place].

## 2024-01-26 ENCOUNTER — Ambulatory Visit (INDEPENDENT_AMBULATORY_CARE_PROVIDER_SITE_OTHER): Payer: Medicare HMO | Admitting: Primary Care

## 2024-01-26 ENCOUNTER — Encounter: Payer: Self-pay | Admitting: Primary Care

## 2024-01-26 VITALS — BP 136/80 | HR 72 | Temp 97.6°F | Ht 61.0 in | Wt 187.0 lb

## 2024-01-26 DIAGNOSIS — Z7985 Long-term (current) use of injectable non-insulin antidiabetic drugs: Secondary | ICD-10-CM

## 2024-01-26 DIAGNOSIS — E2839 Other primary ovarian failure: Secondary | ICD-10-CM | POA: Diagnosis not present

## 2024-01-26 DIAGNOSIS — E039 Hypothyroidism, unspecified: Secondary | ICD-10-CM | POA: Diagnosis not present

## 2024-01-26 DIAGNOSIS — E1165 Type 2 diabetes mellitus with hyperglycemia: Secondary | ICD-10-CM | POA: Diagnosis not present

## 2024-01-26 DIAGNOSIS — F3342 Major depressive disorder, recurrent, in full remission: Secondary | ICD-10-CM | POA: Diagnosis not present

## 2024-01-26 DIAGNOSIS — E559 Vitamin D deficiency, unspecified: Secondary | ICD-10-CM

## 2024-01-26 DIAGNOSIS — Z170 Estrogen receptor positive status [ER+]: Secondary | ICD-10-CM | POA: Diagnosis not present

## 2024-01-26 DIAGNOSIS — C50411 Malignant neoplasm of upper-outer quadrant of right female breast: Secondary | ICD-10-CM | POA: Diagnosis not present

## 2024-01-26 DIAGNOSIS — Z0000 Encounter for general adult medical examination without abnormal findings: Secondary | ICD-10-CM

## 2024-01-26 DIAGNOSIS — K219 Gastro-esophageal reflux disease without esophagitis: Secondary | ICD-10-CM | POA: Diagnosis not present

## 2024-01-26 DIAGNOSIS — E785 Hyperlipidemia, unspecified: Secondary | ICD-10-CM | POA: Diagnosis not present

## 2024-01-26 NOTE — Progress Notes (Signed)
Subjective:    Patient ID: Valerie Gordon, female    DOB: [birth year], 73 y.o.   MRN: [id number]  HPI  Valerie Gordon is a very pleasant 73 y.o. female who presents today for complete physical and follow up of chronic conditions.   Immunizations: -Tetanus: Completed in 2017 -Influenza: Influenza vaccine provided today.  -Shingles: Completed Shingrix series -Pneumonia: Completed Prevnar 13 in 2021, pneumovax in 2017, Prevnar 20 in 2022  Diet: Fair diet.  Exercise: No regular exercise.  Eye exam: Completes annually  Dental exam: Completes semi-annually    Mammogram: May 2024 Bone Density Scan: March 2023  Colonoscopy: Completed in 2017, due 2027  BP Readings from Last 3 Encounters:  01/26/24 136/80  01/14/24 (!) 148/53  07/23/23 128/72       Review of Systems  Constitutional:  Negative for unexpected weight change.  HENT:  Negative for rhinorrhea.   Respiratory:  Negative for cough and shortness of breath.   Cardiovascular:  Negative for chest pain.  Gastrointestinal:  Positive for constipation. Negative for diarrhea.  Genitourinary:  Negative for difficulty urinating.  Musculoskeletal:  Negative for arthralgias and myalgias.  Skin:  Negative for rash.  Allergic/Immunologic: Negative for environmental allergies.  Neurological:  Negative for dizziness, numbness and headaches.  Psychiatric/Behavioral:  The patient is not nervous/anxious.          Past Medical History:  Diagnosis Date   Acute diarrhea 05/14/2021   Arthritis    lumbar spine   Breast cancer (HCC)    Cataract    COVID-19 virus infection 10/14/2021   Depression    Gallstones    asyptomatic   GERD (gastroesophageal reflux disease)    Glaucoma    Headache    h/o migraines   Hyperlipidemia    Hypothyroidism    Obesity    Thyroid disease    Type 2 diabetes mellitus (HCC)    manages with diet and exercise    Social History   Socioeconomic History   Marital status: Widowed    Spouse  name: Not on file   Number of children: Not on file   Years of education: Not on file   Highest education level: Not on file  Occupational History   Not on file  Tobacco Use   Smoking status: Never   Smokeless tobacco: Never  Vaping Use   Vaping status: Never Used  Substance and Sexual Activity   Alcohol use: Yes    Comment: occasional wine   Drug use: No   Sexual activity: Yes  Other Topics Concern   Not on file  Social History Narrative   Widower.   Step children.   Librarian, academic.   Enjoys riding hot air balloons, spending time with friends, reading, jewelry   Social Drivers of Health   Financial Resource Strain: Low Risk  (12/06/2023)   Overall Financial Resource Strain (CARDIA)    Difficulty of Paying Living Expenses: Not hard at all  Food Insecurity: No Food Insecurity (12/06/2023)   Hunger Vital Sign    Worried About Running Out of Food in the Last Year: Never true    Ran Out of Food in the Last Year: Never true  Transportation Needs: No Transportation Needs (12/06/2023)   PRAPARE - Administrator, Civil Service (Medical): No    Lack of Transportation (Non-Medical): No  Physical Activity: Inactive (12/06/2023)   Exercise Vital Sign    Days of Exercise per Week: 0 days    Minutes  of Exercise per Session: 0 min  Stress: No Stress Concern Present (12/06/2023)   [employer] of Occupational Health - Occupational Stress Questionnaire    Feeling of Stress : Not at all  Social Connections: Moderately Isolated (12/06/2023)   Social Connection and Isolation Panel [NHANES]    Frequency of Communication with Friends and Family: More than three times a week    Frequency of Social Gatherings with Friends and Family: More than three times a week    Attends Religious Services: Never    Database administrator or Organizations: Yes    Attends Engineer, structural: More than 4 times per year    Marital Status: Widowed  Intimate Partner  Violence: Not At Risk (12/06/2023)   Humiliation, Afraid, Rape, and Kick questionnaire    Fear of Current or Ex-Partner: No    Emotionally Abused: No    Physically Abused: No    Sexually Abused: No    Past Surgical History:  Procedure Laterality Date   AXILLARY SENTINEL NODE BIOPSY Right 06/17/2022   Procedure: AXILLARY SENTINEL NODE BIOPSY;  Surgeon: Earline Mayotte, MD;  Location: [facility] OR[facility];  Service: General;  Laterality: Right;   BREAST BIOPSY  12/28/2008   BREAST BIOPSY Right 04/28/2022   [place] core bx 11:00 coil clip -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   BREAST LUMPECTOMY WITH NEEDLE LOCALIZATION Bilateral 06/17/2022   Procedure: BREAST LUMPECTOMY WITH NEEDLE LOCALIZATION;  Surgeon: Earline Mayotte, MD;  Location: [facility];  Service: General;  Laterality: Bilateral;   CARDIAC CATHETERIZATION  12/28/1996   and a glaucoma procedure as well that has a metal shunt in left eye   COLONOSCOPY  10/30/2004   COLONOSCOPY WITH PROPOFOL N/A 04/01/2016   Procedure: COLONOSCOPY WITH PROPOFOL;  Surgeon: Earline Mayotte, MD;  Location: [facility];  Service: Endoscopy;  Laterality: N/A;   EYE SURGERY Bilateral    cataract sx   TONSILLECTOMY     WISDOM TOOTH EXTRACTION  1983    Family History  Problem Relation Age of Onset   Stroke Mother    Heart disease Father    Lung cancer Maternal Grandmother    Stomach cancer Paternal Grandmother    Breast cancer Neg Hx     Allergies  Allergen Reactions   Ciprofloxacin Swelling    At the injection site   Trulicity [Dulaglutide]     Itch at inj site   Antihistamines, Chlorpheniramine-Type Palpitations    Current Outpatient Medications on File Prior to Visit  Medication Sig Dispense Refill   anastrozole (ARIMIDEX) 1 MG tablet Take 1 tablet (1 mg total) by mouth daily. 90 tablet 1   b complex  vitamins tablet Take 1 tablet by mouth daily.     BIOTIN PO Take 5 mg by mouth daily.     Cholecalciferol 50 MCG (2000 UT) CAPS Take 1 capsule by mouth daily after lunch.     Continuous Glucose Sensor (FREESTYLE LIBRE 3 SENSOR) MISC Place 1 sensor on the skin every 14 days. Use to check glucose continuously 6 each 1   famotidine (PEPCID) 20 MG tablet Take 20 mg by mouth at bedtime.     Fish Oil-Krill Oil (KRILL OIL PLUS PO) Take 1 tablet by mouth daily at 6 (six) AM.     fluticasone (FLONASE) 50 MCG/ACT nasal spray Place into both nostrils daily as needed for  allergies or rhinitis.     Insulin Pen Needle (B-D ULTRAFINE III SHORT PEN) 31G X 8 MM MISC USE WITH INSULIN NIGHTLY 100 each 1   latanoprost (XALATAN) 0.005 % ophthalmic solution Place 1 drop into both eyes every morning.     levothyroxine (SYNTHROID) 100 MCG tablet TAKE 1 TABLET BY MOUTH IN THE MORNING ON  AN  EMPTY  STOMACH  WITH  WATER  ONLY  (NO  FOOD  OR  OTHER  MEDICATIONS  FOR  30  MINUTES) 90 tablet 2   Magnesium 200 MG TABS Take 1 tablet by mouth at bedtime.     MOUNJARO 7.5 MG/0.5ML Pen ADMINISTER 7.5 MG UNDER THE SKIN 1 TIME A WEEK FOR DIABETES 6 mL 0   rosuvastatin (CRESTOR) 10 MG tablet TAKE 1 TABLET BY MOUTH ONCE DAILY FOR CHOLESTEROL 90 tablet 0   VITAMIN A PO Take 2,400 mcg by mouth daily at 6 (six) AM.     No current facility-administered medications on file prior to visit.    BP 136/80   Pulse 72   Temp 97.6 F (36.4 C) (Temporal)   Ht 5\' 1"  (1.549 m)   Wt 187 lb (84.8 kg)   SpO2 96%   BMI 35.33 kg/m  Objective:   Physical Exam HENT:     Right Ear: Tympanic membrane and ear canal normal.     Left Ear: Tympanic membrane and ear canal normal.  Eyes:     Pupils: Pupils are equal, round, and reactive to light.  Cardiovascular:     Rate and Rhythm: Normal rate and regular rhythm.  Pulmonary:     Effort: Pulmonary effort is normal.     Breath sounds: Normal breath sounds.  Abdominal:     General: Bowel sounds  are normal.     Palpations: Abdomen is soft.     Tenderness: There is no abdominal tenderness.  Musculoskeletal:        General: Normal range of motion.     Cervical back: Neck supple.  Skin:    General: Skin is warm and dry.  Neurological:     Mental Status: She is alert and oriented to person, place, and time.     Cranial Nerves: No cranial nerve deficit.     Deep Tendon Reflexes:     Reflex Scores:      Patellar reflexes are 2+ on the right side and 2+ on the left side. Psychiatric:        Mood and Affect: Mood normal.           Assessment & Plan:  Estrogen deficiency -     DG Bone Density; Future  Gastroesophageal reflux disease without esophagitis Assessment & Plan: Controlled.  Continue famotidine 20 mg daily.   Vitamin D deficiency Assessment & Plan: Continue vitamin D supplement. Reviewed vitamin D from oncology from January 2025   Type 2 diabetes mellitus with hyperglycemia, unspecified whether long term insulin use (HCC) Assessment & Plan: Repeat A1C pending.  Continue Mounjaro 7.5 mg weekly. Foot exam today.  Follow up in 6 months  Orders: -     Hemoglobin A1c -     Basic metabolic panel  Hypothyroidism, unspecified type Assessment & Plan: She is taking levothyroxine incorrectly.  Discussed to separate magnesium and vitamins 4 hours from levothyroxine. Continue levothyroxine 100 mcg daily.  Repeat TSH pending.  Orders: -     TSH  Hyperlipidemia, unspecified hyperlipidemia type Assessment & Plan: Repeat lipid panel pending. Continue rosuvastatin 10  mg daily.  Orders: -     Lipid panel  Preventative health care Assessment & Plan: Immunizations UTD. Mammogram UTD. Bone density scan due in April 2025, orders placed.  Colonoscopy UTD, due 2027  Discussed the importance of a healthy diet and regular exercise in order for weight loss, and to reduce the risk of further co-morbidity.  Exam stable. Labs pending.  Follow up in 1  year for repeat physical.    Recurrent major depressive disorder, in full remission [facility]) Assessment & Plan: Controlled  No concerns today. Continue to monitor.    Carcinoma of upper-outer quadrant of right breast in female, estrogen receptor positive (HCC) Assessment & Plan: Following with oncology and radiology oncology, reviewed office notes and labs from January 2025.  Continue anastrozole 1 mg daily. Mammogram UTD         Doreene Nest, NP

## 2024-01-26 NOTE — Assessment & Plan Note (Signed)
Immunizations UTD. Mammogram UTD. Bone density scan due in April 2025, orders placed.  Colonoscopy UTD, due 2027  Discussed the importance of a healthy diet and regular exercise in order for weight loss, and to reduce the risk of further co-morbidity.  Exam stable. Labs pending.  Follow up in 1 year for repeat physical.

## 2024-01-26 NOTE — Assessment & Plan Note (Signed)
Following with oncology and radiology oncology, reviewed office notes and labs from January 2025.  Continue anastrozole 1 mg daily. Mammogram UTD

## 2024-01-26 NOTE — Assessment & Plan Note (Signed)
Repeat lipid panel pending. Continue rosuvastatin 10 mg daily.

## 2024-01-26 NOTE — Assessment & Plan Note (Signed)
Repeat A1C pending.  Continue Mounjaro 7.5 mg weekly. Foot exam today.  Follow up in 6 months

## 2024-01-26 NOTE — Assessment & Plan Note (Signed)
She is taking levothyroxine incorrectly.  Discussed to separate magnesium and vitamins 4 hours from levothyroxine. Continue levothyroxine 100 mcg daily.  Repeat TSH pending.

## 2024-01-26 NOTE — Patient Instructions (Addendum)
Stop by the lab prior to leaving today. I will notify you of your results once received.   Be sure to take your levothyroxine (thyroid medication) every morning on an empty stomach with water only.   No food or other medications for 30 minutes.   No heartburn medication, iron pills, calcium, vitamin D, or magnesium pills within four hours of taking levothyroxine.   Schedule your bone density scan for April or May 2025.  Please schedule a follow up visit for 6 months for a diabetes check.  It was a pleasure to see you today!

## 2024-01-26 NOTE — Assessment & Plan Note (Signed)
Controlled.  No concerns today. Continue to monitor.

## 2024-01-26 NOTE — Assessment & Plan Note (Addendum)
Continue vitamin D supplement. Reviewed vitamin D from oncology from January 2025

## 2024-01-26 NOTE — Assessment & Plan Note (Signed)
Controlled. ? ?Continue famotidine 20 mg daily.  ?

## 2024-01-27 LAB — LIPID PANEL
Cholesterol: 145 mg/dL (ref 0–200)
HDL: 61.3 mg/dL (ref >39.00)
LDL Cholesterol: 60 mg/dL (ref 0–99)
NonHDL: 83.78
Total CHOL/HDL Ratio: 2
Triglycerides: 118 mg/dL (ref 0.0–149.0)
VLDL: 23.6 mg/dL (ref 0.0–40.0)

## 2024-01-27 LAB — BASIC METABOLIC PANEL
BUN: 13 mg/dL (ref 6–23)
CO2: 24 meq/L (ref 19–32)
Calcium: 9.9 mg/dL (ref 8.4–10.5)
Chloride: 101 meq/L (ref 96–112)
Creatinine, Ser: 0.86 mg/dL (ref 0.40–1.20)
GFR: 68.39 mL/min (ref >60.00)
Glucose, Bld: 112 mg/dL — ABNORMAL HIGH (ref 70–99)
Potassium: 4.1 meq/L (ref 3.5–5.1)
Sodium: 136 meq/L (ref 135–145)

## 2024-01-27 LAB — TSH: TSH: 1 u[IU]/mL (ref 0.35–5.50)

## 2024-01-27 LAB — HEMOGLOBIN A1C: Hgb A1c MFr Bld: 5.7 % (ref 4.6–6.5)

## 2024-02-14 DIAGNOSIS — H401133 Primary open-angle glaucoma, bilateral, severe stage: Secondary | ICD-10-CM | POA: Diagnosis not present

## 2024-02-15 ENCOUNTER — Other Ambulatory Visit: Payer: Self-pay

## 2024-02-15 DIAGNOSIS — E039 Hypothyroidism, unspecified: Secondary | ICD-10-CM

## 2024-02-15 MED ORDER — LEVOTHYROXINE SODIUM 100 MCG PO TABS: ORAL_TABLET | ORAL | 2 refills | Status: AC

## 2024-02-24 DIAGNOSIS — E1165 Type 2 diabetes mellitus with hyperglycemia: Secondary | ICD-10-CM

## 2024-02-25 MED ORDER — MOUNJARO 7.5 MG/0.5ML ~~LOC~~ SOAJ: 7.5000 mg | SUBCUTANEOUS | 1 refills | Status: DC

## 2024-02-28 DIAGNOSIS — Z961 Presence of intraocular lens: Secondary | ICD-10-CM | POA: Diagnosis not present

## 2024-02-28 DIAGNOSIS — H401133 Primary open-angle glaucoma, bilateral, severe stage: Secondary | ICD-10-CM | POA: Diagnosis not present

## 2024-03-01 ENCOUNTER — Other Ambulatory Visit (HOSPITAL_COMMUNITY): Payer: Self-pay

## 2024-03-01 ENCOUNTER — Telehealth: Payer: Self-pay

## 2024-03-01 NOTE — Telephone Encounter (Signed)
 Pharmacy Patient Advocate Encounter   Received notification from Patient Advice Request messages that prior authorization for Mounjaro 7.5MG /0.5ML auto-injectors is required/requested.   Insurance verification completed.   The patient is insured through [facility] ADVANTAGE/RX ADVANCE .   Per test claim: PA required and submitted KEY/EOC/Request #: [id number] CANCELLED due to: Prior Authorization duplicate/in progress   Called insurance at [phone] to check status of previously submitted PA. Per representative, member started PA on 02/29/24 and diagnosis, lab results, and charts notes were required. Faxed lab results and chart notes to [phone] and provided diagnosis.  Req ID: [id number]

## 2024-03-01 NOTE — Telephone Encounter (Signed)
 Received the Fax with the following information

## 2024-03-02 ENCOUNTER — Other Ambulatory Visit (HOSPITAL_COMMUNITY): Payer: Self-pay

## 2024-03-02 NOTE — Telephone Encounter (Signed)
 Pharmacy Patient Advocate Encounter  Received notification from [facility] ADVANTAGE/RX ADVANCE that Prior Authorization for Mounjaro 7.5MG /0.5ML auto-injectors  has been APPROVED from 03/01/24 to 03/01/25. Unable to obtain price due to refill too soon rejection, last fill date 03/01/24 next available fill date5/7/25   PA #/Case ID/Reference #: [id number]

## 2024-03-05 NOTE — Telephone Encounter (Signed)
 Valerie Gordon, will you update her vaccines?

## 2024-03-21 DIAGNOSIS — E669 Obesity, unspecified: Secondary | ICD-10-CM | POA: Diagnosis not present

## 2024-03-21 DIAGNOSIS — G8929 Other chronic pain: Secondary | ICD-10-CM | POA: Diagnosis not present

## 2024-03-21 DIAGNOSIS — E1169 Type 2 diabetes mellitus with other specified complication: Secondary | ICD-10-CM | POA: Diagnosis not present

## 2024-03-21 DIAGNOSIS — H42 Glaucoma in diseases classified elsewhere: Secondary | ICD-10-CM | POA: Diagnosis not present

## 2024-03-21 DIAGNOSIS — Z853 Personal history of malignant neoplasm of breast: Secondary | ICD-10-CM | POA: Diagnosis not present

## 2024-03-21 DIAGNOSIS — E559 Vitamin D deficiency, unspecified: Secondary | ICD-10-CM | POA: Diagnosis not present

## 2024-03-21 DIAGNOSIS — E785 Hyperlipidemia, unspecified: Secondary | ICD-10-CM | POA: Diagnosis not present

## 2024-03-21 DIAGNOSIS — E039 Hypothyroidism, unspecified: Secondary | ICD-10-CM | POA: Diagnosis not present

## 2024-03-21 DIAGNOSIS — E1139 Type 2 diabetes mellitus with other diabetic ophthalmic complication: Secondary | ICD-10-CM | POA: Diagnosis not present

## 2024-03-25 ENCOUNTER — Other Ambulatory Visit: Payer: Self-pay | Admitting: Primary Care

## 2024-03-25 DIAGNOSIS — E785 Hyperlipidemia, unspecified: Secondary | ICD-10-CM

## 2024-04-21 DIAGNOSIS — H401133 Primary open-angle glaucoma, bilateral, severe stage: Secondary | ICD-10-CM | POA: Diagnosis not present

## 2024-04-21 DIAGNOSIS — Z961 Presence of intraocular lens: Secondary | ICD-10-CM | POA: Diagnosis not present

## 2024-04-24 ENCOUNTER — Other Ambulatory Visit: Payer: Self-pay | Admitting: Surgery

## 2024-04-24 DIAGNOSIS — Z853 Personal history of malignant neoplasm of breast: Secondary | ICD-10-CM

## 2024-05-29 ENCOUNTER — Ambulatory Visit
Admission: RE | Admit: 2024-05-29 | Discharge: 2024-05-29 | Disposition: A | Discharge status: Home / Self Care | Source: Ambulatory Visit | Attending: Surgery | Admitting: Surgery

## 2024-05-29 DIAGNOSIS — Z853 Personal history of malignant neoplasm of breast: Secondary | ICD-10-CM | POA: Insufficient documentation

## 2024-05-29 DIAGNOSIS — C50911 Malignant neoplasm of unspecified site of right female breast: Secondary | ICD-10-CM | POA: Diagnosis not present

## 2024-05-29 DIAGNOSIS — R92333 Mammographic heterogeneous density, bilateral breasts: Secondary | ICD-10-CM | POA: Diagnosis not present

## 2024-06-05 DIAGNOSIS — Z853 Personal history of malignant neoplasm of breast: Secondary | ICD-10-CM | POA: Diagnosis not present

## 2024-06-05 DIAGNOSIS — M65331 Trigger finger, right middle finger: Secondary | ICD-10-CM | POA: Diagnosis not present

## 2024-06-05 NOTE — Progress Notes (Signed)
 Subjective:   CC: Personal history of breast cancer [Z85.3] HPI:  Valerie Gordon is a 73 y.o. female who returns for evaluation of above. right breast lumpectomy with needle localization and SLN and a left breast lumpectomy with needle localization on 06-17-22.   No issues regarding breast.  Is asking about trigger finger.  Acquired after softball injury many years ago.  Noticing increasing stiffness and discomfort, no episode of locked finger yet.   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  Past Surgical History:  has a past surgical history that includes ultrasound guided core breast biopsy (Right, 04/28/2022); Colonoscopy (04/01/2016); incisional biopsy breast (12/28/2008); Colonoscopy (10/30/2004); Colonoscopy (04/01/2016); cardiac catheterization (12/28/1996); Cataract extraction (Bilateral); Tonsillectomy; Breast excisional biopsy (Left, 05/22/2022); Mastectomy partial / lumpectomy (Right, 06/17/2022); and Mastectomy partial / lumpectomy (Left, 06/17/2022).  Family History: family history includes Heart disease in her father; Stroke in her mother.  Social History:  reports that she has never smoked. She has never used smokeless tobacco. She reports current alcohol use. She reports that she does not use drugs.  Current Medications: has a current medication list which includes the following prescription(s): anastrozole , ascorbic acid (vitamin c), b complex vitamins, bd ultra-fine short pen needle, cholecalciferol, famotidine, fluticasone propionate, latanoprost, levothyroxine , magnesium, mounjaro , rosuvastatin , timolol maleate, vitamin e, insulin  glargine, mounjaro , and pen needle, diabetic.  Allergies:  Allergies as of  06/05/2024 - Reviewed 06/05/2024  Allergen Reaction Noted  . Chlorpheniramine Unknown 04/29/2022  . Ciprofloxacin Swelling 06/11/2022  . Fluocinolone Unknown 04/29/2022  . Dulaglutide  Itching 10/30/2019    ROS:  A 15 point review of systems was performed and was negative except as noted in HPI   Objective:     BP 123/82   Pulse 81   Ht 154.9 cm (5' 1)   SpO2 96% Comment: RA  BMI 39.11 kg/m   Constitutional :  No distress, cooperative, alert  Lymphatics/Throat:  Supple with no lymphadenopathy  Respiratory:  Clear to auscultation bilaterally  Cardiovascular:  Regular rate and rhythm  Gastrointestinal: Soft, non-tender, non-distended, no organomegaly.  Musculoskeletal: Steady gait and movement. Right hand with full ROM fingers, with some stiffness noted with middle finger  Skin: Cool and moist  Psychiatric: Normal affect, non-agitated, not confused  Breast: Post op lumpectomy and radiation changes noted in bilateral breasts.  SLNB incision in right axilla. As expected with no concerns for additional pathology.   Chaperone present for exam.      LABS:  N/a   RADS: CLINICAL DATA:  Status post bilateral lumpectomy on June twenty-first 2023 for LEFT breast DCIS and RIGHT breast invasive mammary carcinoma.   EXAM: DIGITAL DIAGNOSTIC BILATERAL MAMMOGRAM WITH TOMOSYNTHESIS AND CAD   TECHNIQUE: Bilateral digital diagnostic mammography and breast tomosynthesis was performed. The images were evaluated with computer-aided detection.   COMPARISON:  Previous exam(s).   ACR Breast Density Category c: The breasts are heterogeneously dense, which may obscure small masses.   FINDINGS: There is density and architectural distortion within the RIGHT breast, consistent with postsurgical changes. These are stable in comparison to prior.   There is density and architectural distortion within the LEFT breast, consistent with postsurgical changes. These are stable in comparison to  prior.   No suspicious mass, distortion, or microcalcifications are identified to suggest presence of malignancy.  IMPRESSION: No mammographic evidence of malignancy bilaterally.   RECOMMENDATION: Recommend bilateral diagnostic mammogram (with RIGHT and LEFT breast ultrasound if deemed necessary) in 1 year. This will establish over 2 years of stability status post lumpectomy.   I have discussed the findings and recommendations with the patient. If applicable, a reminder letter will be sent to the patient regarding the next appointment.   BI-RADS CATEGORY  2: Benign.     Electronically Signed   By: Corean Salter M.D.   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 Status post bilateral whole breast radiation.   She will be a candidate for screening colonoscopy in April 2027 (2017 exam showed only diverticulosis).   Trigger finger, right, middle  Plan:     1. Personal history of breast cancer [Z85.3] Exam reassuring.  antihormonal treatment with onc, Dr. B.  F/u one year for mammo.  Ortho referral placed for trigger finger per patient request, since it is beyond our scope of practice.  labs/images/medications/previous chart entries reviewed personally and relevant changes/updates noted above.

## 2024-06-08 DIAGNOSIS — Z961 Presence of intraocular lens: Secondary | ICD-10-CM | POA: Diagnosis not present

## 2024-06-08 DIAGNOSIS — H401133 Primary open-angle glaucoma, bilateral, severe stage: Secondary | ICD-10-CM | POA: Diagnosis not present

## 2024-06-26 DIAGNOSIS — E119 Type 2 diabetes mellitus without complications: Secondary | ICD-10-CM | POA: Diagnosis not present

## 2024-06-26 DIAGNOSIS — M65331 Trigger finger, right middle finger: Secondary | ICD-10-CM | POA: Diagnosis not present

## 2024-07-12 DIAGNOSIS — E1165 Type 2 diabetes mellitus with hyperglycemia: Secondary | ICD-10-CM

## 2024-07-12 MED ORDER — FREESTYLE LIBRE 3 PLUS SENSOR MISC: 1 refills | Status: DC

## 2024-07-14 ENCOUNTER — Other Ambulatory Visit: Payer: Medicare HMO

## 2024-07-14 ENCOUNTER — Inpatient Hospital Stay: Payer: Medicare HMO | Admitting: Internal Medicine

## 2024-07-17 DIAGNOSIS — M65331 Trigger finger, right middle finger: Secondary | ICD-10-CM | POA: Diagnosis not present

## 2024-07-17 NOTE — Progress Notes (Signed)
 PREOP DIAGNOSIS:  right middle Trigger Finger.ganglion of flexor tendon sheath   POSTOP DIAGNOSIS:  right middle Trigger Finger.ganglion of flexor tendon sheath   PROCEDURE:  Trigger Finger Release. Excision ganglion of flexor tendon sheath   ANESTHESIA:  Local.  SURGEON:  Ozell Flake, MD.  ASSISTANT:  Luke Goldberg, CMA.  DESCRIPTION OF PROCEDURE:  After time out procedure completed, the patient was given local anesthetic using 2.5 CC of 0.5% Marcaine , 2.5 CC 1% Xylocaine  into each finer at the level of the planned incision.  This was done after first prepping the skin with chlorhexidine .  Ethyl Chloride Spray was applied.  A 25 gauge needle was used.  After being brought to the Operating Room, the patient's arm was prepped and draped in the usual sterile fashion with a tourniquet applied to the upper forearm.  After checking to make sure that adequate anesthesia was obtained, the tourniquet was raised to 250 mmHg.  Skin incision was made and the finger was addressed, spreading the subcutaneous tissue.  Retractors were placed.  The base of the A1 pulley was identified and incisedwith ganglion of flexor tendon sheath excised inline with the the AI pulley The wound was then closed with simple, interrupted #4-0 Nylon.  A sterile, compressive dressing of Adaptic, 4x4's, Webril and Ace wrap was applied and the tourniquet let down.  Tourniquet time was 6 minutes at 250 mmHg.  The patient tolerated the procedure well.     COMPLICATIONS:  None.  SPECIMENS:  None.

## 2024-07-26 ENCOUNTER — Ambulatory Visit (INDEPENDENT_AMBULATORY_CARE_PROVIDER_SITE_OTHER): Payer: Medicare HMO | Admitting: Primary Care

## 2024-07-26 ENCOUNTER — Encounter: Payer: Self-pay | Admitting: Primary Care

## 2024-07-26 VITALS — BP 122/62 | HR 90 | Temp 97.5°F | Ht 61.0 in | Wt 186.0 lb

## 2024-07-26 DIAGNOSIS — Z7985 Long-term (current) use of injectable non-insulin antidiabetic drugs: Secondary | ICD-10-CM | POA: Diagnosis not present

## 2024-07-26 DIAGNOSIS — E1165 Type 2 diabetes mellitus with hyperglycemia: Secondary | ICD-10-CM

## 2024-07-26 LAB — POCT GLYCOSYLATED HEMOGLOBIN (HGB A1C): Hemoglobin A1C: 5.2 % (ref 4.0–5.6)

## 2024-07-26 NOTE — Patient Instructions (Signed)
 Stop by the lab prior to leaving today. I will notify you of your results once received.   Please schedule a physical to meet with me in 6 months.   It was a pleasure to see you today!

## 2024-07-26 NOTE — Assessment & Plan Note (Signed)
 Well controlled with A1C of 5.2 today.   Continue Mounjaro  7.5 mg weekly. She will work on exercise, continue to work on her diet.   Urine microalbumin due. Foot exam today.  Follow up in 6 months.

## 2024-07-26 NOTE — Progress Notes (Signed)
 Subjective:    Patient ID: Valerie Gordon, female    DOB: 18-Jan-1951, 73 y.o.   MRN: [id number]  Diabetes Pertinent negatives for diabetes include no chest pain.    Valerie Gordon is a very pleasant 73 y.o. female with a history of hypothyroidism, type 2 diabetes, breast cancer, hyperlipidemia who presents today for follow-up of diabetes.  Current medications include: Mounjaro  7.5 mg weekly. She denies nausea, GI upset, GERD  She is checking her blood glucose continuously times daily and is getting readings of:  AM fasting: 70-80s Afternoon: 70-80s Bedtime: 70-80s  Last A1C: 5.7 in January 2025, 5.2 today Last Eye Exam: Up-to-date Last Foot Exam: Due Pneumonia Vaccination: 2022 Urine Microalbumin: Due Statin: Rosuvastatin   Dietary changes since last visit: Overall healthy diet. Mostly home cooked meals. Reduced appetite.    Exercise: Active at home.  Wt Readings from Last 3 Encounters:  07/26/24 186 lb (84.4 kg)  01/26/24 187 lb (84.8 kg)  01/14/24 186 lb 6.4 oz (84.6 kg)   BP Readings from Last 3 Encounters:  07/26/24 122/62  01/26/24 136/80  01/14/24 (!) 148/53       Review of Systems  Respiratory:  Negative for shortness of breath.   Cardiovascular:  Negative for chest pain.  Gastrointestinal:  Negative for abdominal pain, constipation and nausea.  Neurological:  Negative for numbness.         Past Medical History:  Diagnosis Date   Acute diarrhea 05/14/2021   Arthritis    lumbar spine   Breast cancer (HCC)    Cataract    COVID-19 virus infection 10/14/2021   Depression    Gallstones    asyptomatic   GERD (gastroesophageal reflux disease)    Glaucoma    Headache    h/o migraines   Hyperlipidemia    Hypothyroidism    Obesity    Thyroid  disease    Type 2 diabetes mellitus (HCC)    manages with diet and exercise    Social History   Socioeconomic History   Marital status: Widowed    Spouse name: Not on file   Number of children: Not  on file   Years of education: Not on file   Highest education level: Not on file  Occupational History   Not on file  Tobacco Use   Smoking status: Never   Smokeless tobacco: Never  Vaping Use   Vaping status: Never Used  Substance and Sexual Activity   Alcohol use: Yes    Comment: occasional wine   Drug use: No   Sexual activity: Yes  Other Topics Concern   Not on file  Social History Narrative   Widower.   Step children.   Librarian, academic.   Enjoys riding hot air balloons, spending time with friends, reading, jewelry   Social Drivers of Health   Financial Resource Strain: Low Risk  (07/17/2024)   Received from [facility]   Overall Financial Resource Strain (CARDIA)    Difficulty of Paying Living Expenses: Not hard at all  Food Insecurity: No Food Insecurity (07/17/2024)   Received from [facility]   Hunger Vital Sign    Within the past 12 months, you worried that your food would run out before you got the money to buy more.: Never true    Within the past 12 months, the food you bought just didn't last and you didn't have money to get more.: Never true  Transportation Needs: No Transportation Needs (07/17/2024)  Received from [facility] - Transportation    In the past 12 months, has lack of transportation kept you from medical appointments or from getting medications?: No    Lack of Transportation (Non-Medical): No  Physical Activity: Inactive (12/06/2023)   Exercise Vital Sign    Days of Exercise per Week: 0 days    Minutes of Exercise per Session: 0 min  Stress: No Stress Concern Present (12/06/2023)   [employer] of Occupational Health - Occupational Stress Questionnaire    Feeling of Stress : Not at all  Social Connections: Moderately Isolated (12/06/2023)   Social Connection and Isolation Panel    Frequency of Communication with Friends and Family: More than three times a week     Frequency of Social Gatherings with Friends and Family: More than three times a week    Attends Religious Services: Never    Database administrator or Organizations: Yes    Attends Engineer, structural: More than 4 times per year    Marital Status: Widowed  Intimate Partner Violence: Not At Risk (12/06/2023)   Humiliation, Afraid, Rape, and Kick questionnaire    Fear of Current or Ex-Partner: No    Emotionally Abused: No    Physically Abused: No    Sexually Abused: No    Past Surgical History:  Procedure Laterality Date   AXILLARY SENTINEL NODE BIOPSY Right 06/17/2022   Procedure: AXILLARY SENTINEL NODE BIOPSY;  Surgeon: Dessa Reyes ORN, MD;  Location: [facility] ORS;  Service: General;  Laterality: Right;   BREAST BIOPSY  12/28/2008   BREAST BIOPSY Right 04/28/2022   US  core bx 11:00 coil clip -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   BREAST LUMPECTOMY WITH NEEDLE LOCALIZATION Bilateral 06/17/2022   Procedure: BREAST LUMPECTOMY WITH NEEDLE LOCALIZATION;  Surgeon: Dessa Reyes ORN, MD;  Location: [facility] ORS;  Service: General;  Laterality: Bilateral;   CARDIAC CATHETERIZATION  12/28/1996   and a glaucoma procedure as well that has a metal shunt in left eye   COLONOSCOPY  10/30/2004   COLONOSCOPY WITH PROPOFOL  N/A 04/01/2016   Procedure: COLONOSCOPY WITH PROPOFOL ;  Surgeon: Reyes ORN Dessa, MD;  Location: [facility] END[facility];  Service: Endoscopy;  Laterality: N/A;   EYE SURGERY Bilateral    cataract sx   TONSILLECTOMY     WISDOM TOOTH EXTRACTION  1983    Family History  Problem Relation Age of Onset   Stroke Mother    Heart disease Father    Lung cancer Maternal Grandmother    Stomach cancer Paternal Grandmother    Breast cancer Neg Hx     Allergies  Allergen Reactions   Ciprofloxacin Swelling    At the injection site   Trulicity   [Dulaglutide ]     Itch at inj site   Antihistamines, Chlorpheniramine-Type Palpitations    Current Outpatient Medications on File Prior to Visit  Medication Sig Dispense Refill   anastrozole  (ARIMIDEX ) 1 MG tablet Take 1 tablet (1 mg total) by mouth daily. 90 tablet 1   b complex vitamins tablet Take 1 tablet by mouth daily.     BIOTIN PO Take 5 mg by mouth daily.     Cholecalciferol 50 MCG (2000 UT) CAPS Take 1 capsule by mouth daily after lunch.     Continuous Glucose Sensor (FREESTYLE LIBRE 3 PLUS SENSOR) MISC Use to check blood sugar  continuously. Change sensor every 15 days. 6 each 1   famotidine (PEPCID) 20 MG tablet Take 20 mg by mouth at bedtime.     Fish Oil-Krill Oil (KRILL OIL PLUS PO) Take 1 tablet by mouth daily at 6 (six) AM.     fluticasone (FLONASE) 50 MCG/ACT nasal spray Place into both nostrils daily as needed for allergies or rhinitis.     Insulin  Pen Needle (B-D ULTRAFINE III SHORT PEN) 31G X 8 MM MISC USE WITH INSULIN  NIGHTLY 100 each 1   latanoprost (XALATAN) 0.005 % ophthalmic solution Place 1 drop into both eyes every morning.     levothyroxine  (SYNTHROID ) 100 MCG tablet TAKE 1 TABLET BY MOUTH IN THE MORNING ON  AN  EMPTY  STOMACH  WITH  WATER  ONLY  (NO  FOOD  OR  OTHER  MEDICATIONS  FOR  30  MINUTES) 90 tablet 2   Magnesium 200 MG TABS Take 1 tablet by mouth at bedtime.     rosuvastatin  (CRESTOR ) 10 MG tablet TAKE 1 TABLET BY MOUTH ONCE DAILY FOR CHOLESTEROL 90 tablet 2   tirzepatide  (MOUNJARO ) 7.5 MG/0.5ML Pen Inject 7.5 mg into the skin once a week. for diabetes. 6 mL 1   VITAMIN A PO Take 2,400 mcg by mouth daily at 6 (six) AM.     No current facility-administered medications on file prior to visit.    BP 122/62   Pulse 90   Temp (!) 97.5 F (36.4 C) (Temporal)   Ht 5' 1 (1.549 m)   Wt 186 lb (84.4 kg)   SpO2 95%   BMI 35.14 kg/m  Objective:   Physical Exam Cardiovascular:     Rate and Rhythm: Normal rate and regular rhythm.  Pulmonary:      Effort: Pulmonary effort is normal.     Breath sounds: Normal breath sounds.  Musculoskeletal:     Cervical back: Neck supple.  Skin:    General: Skin is warm and dry.  Neurological:     Mental Status: She is alert and oriented to person, place, and time.  Psychiatric:        Mood and Affect: Mood normal.           Assessment & Plan:  Type 2 diabetes mellitus with hyperglycemia, unspecified whether long term insulin  use (HCC) Assessment & Plan: Well controlled with A1C of 5.2 today.   Continue Mounjaro  7.5 mg weekly. She will work on exercise, continue to work on her diet.   Urine microalbumin due. Foot exam today.  Follow up in 6 months.   Orders: -     POCT glycosylated hemoglobin (Hb A1C) -     Microalbumin / creatinine urine ratio        Comer MARLA Gaskins, NP

## 2024-07-27 ENCOUNTER — Ambulatory Visit: Payer: Self-pay | Admitting: Primary Care

## 2024-07-27 LAB — MICROALBUMIN / CREATININE URINE RATIO
Creatinine,U: 65.1 mg/dL
Microalb Creat Ratio: UNDETERMINED mg/g (ref 0.0–30.0)
Microalb, Ur: <0.7 mg/dL

## 2024-07-31 NOTE — Progress Notes (Signed)
 This note has been created using automated tools and reviewed for accuracy by [facility].  Chief Complaint  Patient presents with  . Right Middle Finger - Post Operative Visit    Subjective  Valerie Gordon is a 73 y.o. female who presents for Post Operative Visit of the Right Middle Finger HPI History of Present Illness Valerie Gordon is a 73 year old female who presents for a post-operative visit following hand surgery.  She is currently using paper tape on the incision and plans to remove it by Thursday or Friday to begin applying lotion to soften the scar, preferring vitamin E or aloe.  She experiences stiffness in her knuckle, describing it as 'as stiff as it can be.' She inquires about the possibility of receiving a cortisone shot.  She expresses gratitude for the surgery, noting that she had a similar procedure 25 years ago and questioned why she waited so long to have it done again.  Review of Systems  Patient Active Problem List  Diagnosis  . Depression  . GERD (gastroesophageal reflux disease)  . Hyperlipidemia  . Hypothyroidism  . Type 2 diabetes mellitus (CMS/HHS-HCC)  . Carcinoma of upper-outer quadrant of right breast in female, estrogen receptor positive (CMS/HHS-HCC)    Outpatient Medications Prior to Visit  Medication Sig Dispense Refill  . anastrozole  (ARIMIDEX ) 1 mg tablet Take 1 mg by mouth once daily    . ascorbic acid, vitamin C, (VITAMIN C) 1000 MG tablet Take 1,000 mg by mouth once daily    . b complex vitamins tablet Take 1 tablet by mouth once daily    . BD ULTRA-FINE SHORT PEN NEEDLE 31 gauge x 5/16 needle USE WITH INSULIN  NIGHTLY    . cholecalciferol (VITAMIN D3) 2,000 unit capsule Take by mouth once daily Vitamin k combo    . famotidine (PEPCID) 20 MG tablet Take 20 mg by mouth at bedtime    . fluticasone propionate (FLONASE) 50 mcg/actuation nasal spray Place into one nostril    . latanoprost (XALATAN) 0.005 % ophthalmic solution once daily    .  levothyroxine  (SYNTHROID ) 100 MCG tablet once daily    . magnesium 200 mg Take by mouth once daily    . MOUNJARO  7.5 mg/0.5 mL pen injector Inject 7.5 mg subcutaneously once a week    . rosuvastatin  (CRESTOR ) 10 MG tablet Take 10 mg by mouth once daily    . timoloL maleate (TIMOPTIC) 0.5 % ophthalmic solution Place 1 drop into the left eye once daily    . vitamin E 200 UNIT capsule Take 200 Units by mouth once daily     No facility-administered medications prior to visit.      Objective  Vitals:   07/31/24 1359  BP: 134/70  Weight: 83.5 kg (184 lb)  Height: 154.9 cm (5' 1)  PainSc: 0-No pain   Body mass index is 34.77 kg/m.  Home Vitals:     Physical Exam Physical Exam MUSCULOSKELETAL: Hand incision well healed, no triggering, full fist formation.  Constitutional: alert, in NAD, and communicates well   Results       Assessment/Plan:   Assessment & Plan Postoperative care for right middle trigger finger release   The incision is well healed with no triggering. She can make a fist, but stiffness persists in the knuckle. No cortisone injection is recommended. Continue range of motion exercises, including using squeeze balls, to improve cartilage condition. Avoid putting pressure on the finger for one week to prevent tendon inflammation. Continue  using paper tape until Thursday or Friday, then remove if not gone. Begin applying lotion to soften the scar, using vitamin E or aloe. Diagnoses and all orders for this visit:  Trigger middle finger of right hand  S/P trigger finger release    This visit was coded based on medical decision making (MDM).           Future Appointments   This patient does not currently have any appointments scheduled.     There are no Patient Instructions on file for this visit.  An after visit summary was provided for the patient either in written format (printed) or through My [facility].  This note has been created using  automated tools and reviewed for accuracy by [facility].

## 2024-08-02 ENCOUNTER — Encounter: Payer: Self-pay | Admitting: Internal Medicine

## 2024-08-02 ENCOUNTER — Inpatient Hospital Stay: Attending: Internal Medicine

## 2024-08-02 ENCOUNTER — Inpatient Hospital Stay: Admitting: Internal Medicine

## 2024-08-02 VITALS — BP 153/70 | HR 85 | Temp 98.4°F | Resp 16 | Wt 187.0 lb

## 2024-08-02 DIAGNOSIS — R232 Flushing: Secondary | ICD-10-CM | POA: Insufficient documentation

## 2024-08-02 DIAGNOSIS — Z800 Family history of malignant neoplasm of digestive organs: Secondary | ICD-10-CM | POA: Insufficient documentation

## 2024-08-02 DIAGNOSIS — M545 Low back pain, unspecified: Secondary | ICD-10-CM | POA: Diagnosis not present

## 2024-08-02 DIAGNOSIS — C50411 Malignant neoplasm of upper-outer quadrant of right female breast: Secondary | ICD-10-CM

## 2024-08-02 DIAGNOSIS — M8580 Other specified disorders of bone density and structure, unspecified site: Secondary | ICD-10-CM | POA: Insufficient documentation

## 2024-08-02 DIAGNOSIS — Z7985 Long-term (current) use of injectable non-insulin antidiabetic drugs: Secondary | ICD-10-CM | POA: Diagnosis not present

## 2024-08-02 DIAGNOSIS — Z170 Estrogen receptor positive status [ER+]: Secondary | ICD-10-CM | POA: Diagnosis not present

## 2024-08-02 DIAGNOSIS — Z79899 Other long term (current) drug therapy: Secondary | ICD-10-CM | POA: Insufficient documentation

## 2024-08-02 DIAGNOSIS — Z1721 Progesterone receptor positive status: Secondary | ICD-10-CM | POA: Insufficient documentation

## 2024-08-02 DIAGNOSIS — Z794 Long term (current) use of insulin: Secondary | ICD-10-CM | POA: Diagnosis not present

## 2024-08-02 DIAGNOSIS — Z801 Family history of malignant neoplasm of trachea, bronchus and lung: Secondary | ICD-10-CM | POA: Insufficient documentation

## 2024-08-02 DIAGNOSIS — R5383 Other fatigue: Secondary | ICD-10-CM | POA: Insufficient documentation

## 2024-08-02 DIAGNOSIS — E119 Type 2 diabetes mellitus without complications: Secondary | ICD-10-CM | POA: Diagnosis not present

## 2024-08-02 DIAGNOSIS — Z1732 Human epidermal growth factor receptor 2 negative status: Secondary | ICD-10-CM | POA: Diagnosis not present

## 2024-08-02 DIAGNOSIS — K219 Gastro-esophageal reflux disease without esophagitis: Secondary | ICD-10-CM | POA: Diagnosis not present

## 2024-08-02 DIAGNOSIS — Z79811 Long term (current) use of aromatase inhibitors: Secondary | ICD-10-CM | POA: Diagnosis not present

## 2024-08-02 DIAGNOSIS — M129 Arthropathy, unspecified: Secondary | ICD-10-CM | POA: Diagnosis not present

## 2024-08-02 DIAGNOSIS — Z8616 Personal history of COVID-19: Secondary | ICD-10-CM | POA: Diagnosis not present

## 2024-08-02 DIAGNOSIS — E785 Hyperlipidemia, unspecified: Secondary | ICD-10-CM | POA: Diagnosis not present

## 2024-08-02 DIAGNOSIS — E039 Hypothyroidism, unspecified: Secondary | ICD-10-CM | POA: Insufficient documentation

## 2024-08-02 DIAGNOSIS — Z923 Personal history of irradiation: Secondary | ICD-10-CM | POA: Insufficient documentation

## 2024-08-02 LAB — CMP (CANCER CENTER ONLY)
ALT: 10 U/L (ref 0–44)
AST: 23 U/L (ref 15–41)
Albumin: 4.1 g/dL (ref 3.5–5.0)
Alkaline Phosphatase: 98 U/L (ref 38–126)
Anion gap: 7 (ref 5–15)
BUN: 12 mg/dL (ref 8–23)
CO2: 25 mmol/L (ref 22–32)
Calcium: 9.6 mg/dL (ref 8.9–10.3)
Chloride: 102 mmol/L (ref 98–111)
Creatinine: 0.9 mg/dL (ref 0.44–1.00)
GFR, Estimated: >60 mL/min (ref >60)
Glucose, Bld: 131 mg/dL — ABNORMAL HIGH (ref 70–99)
Potassium: 4.4 mmol/L (ref 3.5–5.1)
Sodium: 134 mmol/L — ABNORMAL LOW (ref 135–145)
Total Bilirubin: 0.7 mg/dL (ref 0.0–1.2)
Total Protein: 7.3 g/dL (ref 6.5–8.1)

## 2024-08-02 LAB — CBC WITH DIFFERENTIAL (CANCER CENTER ONLY)
Abs Immature Granulocytes: 0.03 K/uL (ref 0.00–0.07)
Basophils Absolute: 0.1 K/uL (ref 0.0–0.1)
Basophils Relative: 1 %
Eosinophils Absolute: 0.2 K/uL (ref 0.0–0.5)
Eosinophils Relative: 3 %
HCT: 45.1 % (ref 36.0–46.0)
Hemoglobin: 15.3 g/dL — ABNORMAL HIGH (ref 12.0–15.0)
Immature Granulocytes: 0 %
Lymphocytes Relative: 22 %
Lymphs Abs: 1.6 K/uL (ref 0.7–4.0)
MCH: 31.1 pg (ref 26.0–34.0)
MCHC: 33.9 g/dL (ref 30.0–36.0)
MCV: 91.7 fL (ref 80.0–100.0)
Monocytes Absolute: 0.6 K/uL (ref 0.1–1.0)
Monocytes Relative: 8 %
Neutro Abs: 5 K/uL (ref 1.7–7.7)
Neutrophils Relative %: 66 %
Platelet Count: 282 K/uL (ref 150–400)
RBC: 4.92 MIL/uL (ref 3.87–5.11)
RDW: 13 % (ref 11.5–15.5)
WBC Count: 7.5 K/uL (ref 4.0–10.5)
nRBC: 0 % (ref 0.0–0.2)

## 2024-08-02 LAB — VITAMIN D 25 HYDROXY (VIT D DEFICIENCY, FRACTURES): Vit D, 25-Hydroxy: 44.67 ng/mL (ref 30–100)

## 2024-08-02 MED ORDER — ANASTROZOLE 1 MG PO TABS: 1.0000 mg | ORAL_TABLET | Freq: Every day | ORAL | 1 refills | Status: DC

## 2024-08-02 NOTE — Progress Notes (Signed)
 Patient here for follow up. Patient doing well; still having hit flashes.

## 2024-08-02 NOTE — Assessment & Plan Note (Addendum)
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 PN=0; G-2. ?  Focal involvement of the margin by DCIS; invasive carcinoma negative margins. Oncotype-RS =8; low risk.  S/p radiation. 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   # Patient on Anastrazole [since Nov 1st 2023]-patient tolerating well except for mild hot flashes/fatigue/insomnia. Mammogram/Dr.Cintron- MAY 2025- WNL- stable.   # Fatigue/insomnia-?  Related to anastrozole .  S/p care program- unable to complete because of conflict of schedules. .   # Hot flashes- from anastrozole  G-1 monitor now. Stable.   # Osteopenia: BMD March 2023- T-score of -1.9.  Continue On ca+vit D.  Stable.   # DM/ Obesity- on Monjauro-   importance of healthy weight/and weight loss.Stable.   # DISPOSITION: # follow up in 6 months  MD; labs-cbc/cmp; vit D levels-Dr.B  Dr.Clark- [place], [place].

## 2024-08-02 NOTE — Progress Notes (Signed)
 [facility] CONSULT NOTE  Patient Care Team: Gretta Comer POUR, NP as PCP - General (Internal Medicine) Fleeta Milks, Lamar ORN, MD as Consulting Physician (Obstetrics and Gynecology) Dessa, Reyes ORN, MD (General Surgery) Wonda Cy BROCKS, RD as Dietitian (Family Medicine) Dannielle Arlean FALCON, RN (Inactive) as Oncology Nurse Navigator Georgina Shasta POUR, RN as Oncology Nurse Navigator Rennie Cindy SAUNDERS, MD as Consulting Physician (Oncology) Lenn Aran, MD as Consulting Physician (Radiation Oncology) Pa, [facility] (Optometry)  CHIEF COMPLAINTS/PURPOSE OF CONSULTATION: Breast cancer  #  Oncology History Overview Note  IMPRESSION: 1. 1.3 cm irregular mass with associated distortion in the right breast at 11 o'clock, 2 cm the nipple, highly suspicious for malignancy. No abnormal right axillary lymph nodes.   RECOMMENDATION: 1. Ultrasound-guided core needle biopsy of the right breast highly suspicious mass. A.  BREAST MASS, RIGHT 11:00 2 CM FN; ULTRASOUND-GUIDED BIOPSY:  - INVASIVE MAMMARY CARCINOMA WITH LOBULAR FEATURES.   Size of invasive carcinoma: 8 mm in this sample  Histologic grade of invasive carcinoma: Grade 2                       Glandular/tubular differentiation score: 3                       Nuclear pleomorphism score: 2                       Mitotic rate score: 1                       Total score: 6  Ductal carcinoma in situ: Present, intermediate grade  Lymphovascular invasion: Not identified   ER/PR/HER2: Immunohistochemistry will be performed on block A2, with  reflex to FISH for HER2 2+. The results will be reported in an addendum.   Comment:  The definitive grade will be assigned on the excisional specimen.   GROSS DESCRIPTION:  A. Labeled: Right breast 11:00 2 cm from the nipple  Received: Formalin  Time/date in fixative: Collected and placed in formalin at 8:30 AM on  04/28/2022  Cold ischemic time: Less than 1 minute  Total fixation  time: Approximately 9 hours  Core pieces: 4  Size: Ranges from 0.5-2.0 cm in length and 0.2 cm in diameter  Description: Tan cores of fibrofatty tissue  Ink color: Black  Entirely submitted in 2 cassettes with 2 cores in A1 and 2 cores in A2.   CM 04/28/2022   Final Diagnosis performed by Rexene Daily, MD.   Electronically signed  04/29/2022 1:04:41PM  The electronic signature indicates that the named Attending Pathologist  has evaluated the specimen  Technical component performed at [facility], [address] [place],  [place] [address] Lab: [phone] Dir: Frankey Sas, MD, MMM   Professional component performed at [facility], [facility], [address] [place], [place], [place] [address] Lab: [phone]  Dir: Wilkie EMERSON Molt, MD   ADDENDUM:  CASE SUMMARY: BREAST BIOMARKER TESTS  Estrogen Receptor (ER) Status: POSITIVE          Percentage of cells with nuclear positivity: Greater than 90%          Average intensity of staining: Strong   Progesterone Receptor (PgR) Status: POSITIVE          Percentage of cells with nuclear positivity: Greater than 90%          Average intensity of staining: Strong  HER2 (by immunohistochemistry): NEGATIVE (Score 0)  Ki-67: Not performed   #April 2023 clinical stage I -ER/PR positive HER2 =0 invasive mammary carcinoma with lobular features.  [Dr.Byrnett] May 2023 breast MRI-right side lesion-measuring 3.4 x 1.7 x 2.2 centimeters.     #May 2023 breast MRI showed-left breast indeterminate oval 1.5 centimeter mass in the UPPER-OUTER QUADRANT LEFT.  S/p biopsy positive for ER/PR positive DCIS. S/p lumpectomy  # Oncotype recurrence score 8-low risk; no chemotherapy  # Bilateral radiation-finished mid September 2023;   # Mid-OCT 2023- START Anastrazole      Carcinoma of upper-outer quadrant of right breast in female, estrogen receptor positive (HCC)  05/06/2022 Initial Diagnosis   Carcinoma of upper-outer quadrant of right breast in  female, estrogen receptor positive (HCC)   05/06/2022 Cancer Staging   Staging form: Breast, AJCC 8th Edition - Clinical: Stage IA (cT1c, cN0, cM0, G2, ER+, PR+, HER2-) - Signed by Rennie Cindy SAUNDERS, MD on 05/06/2022 Histologic grading system: 3 grade system    HISTORY OF PRESENTING ILLNESS: Ambulating independently.  Alone.   Valerie Gordon 73 y.o.  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   Patient doing well; still having hot flashes.   Denies any breast pain or discomfort.   Chronic low back pain.   Review of Systems  Constitutional:  Negative for chills, diaphoresis, fever, malaise/fatigue and weight loss.  HENT:  Negative for nosebleeds and sore throat.   Eyes:  Negative for double vision.  Respiratory:  Negative for cough, hemoptysis, sputum production, shortness of breath and wheezing.   Cardiovascular:  Negative for chest pain, palpitations, orthopnea and leg swelling.  Gastrointestinal:  Negative for abdominal pain, blood in stool, constipation, diarrhea, heartburn, melena, nausea and vomiting.  Genitourinary:  Negative for dysuria, frequency and urgency.  Musculoskeletal:  Negative for back pain and joint pain.  Skin: Negative.  Negative for itching and rash.  Neurological:  Negative for dizziness, tingling, focal weakness, weakness and headaches.  Endo/Heme/Allergies:  Does not bruise/bleed easily.  Psychiatric/Behavioral:  Negative for depression. The patient is not nervous/anxious and does not have insomnia.      MEDICAL HISTORY:  Past Medical History:  Diagnosis Date   Acute diarrhea 05/14/2021   Arthritis    lumbar spine   Breast cancer (HCC)    Cataract    COVID-19 virus infection 10/14/2021   Depression    Gallstones    asyptomatic   GERD (gastroesophageal reflux disease)    Glaucoma    Headache    h/o migraines   Hyperlipidemia     Hypothyroidism    Obesity    Thyroid  disease    Type 2 diabetes mellitus (HCC)    manages with diet and exercise    SURGICAL HISTORY: Past Surgical History:  Procedure Laterality Date   AXILLARY SENTINEL NODE BIOPSY Right 06/17/2022   Procedure: AXILLARY SENTINEL NODE BIOPSY;  Surgeon: Dessa Reyes ORN, MD;  Location: [facility] ORS;  Service: General;  Laterality: Right;   BREAST BIOPSY  12/28/2008   BREAST BIOPSY Right 04/28/2022   US  core bx 11:00 coil clip -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   BREAST LUMPECTOMY WITH NEEDLE LOCALIZATION Bilateral 06/17/2022   Procedure: BREAST LUMPECTOMY WITH NEEDLE LOCALIZATION;  Surgeon: Dessa Reyes ORN, MD;  Location: [facility] ORS;  Service: General;  Laterality: Bilateral;   CARDIAC CATHETERIZATION  12/28/1996   and a glaucoma procedure as well that has a metal shunt in left eye   COLONOSCOPY  10/30/2004   COLONOSCOPY WITH PROPOFOL  N/A 04/01/2016   Procedure: COLONOSCOPY WITH PROPOFOL ;  Surgeon: Reyes LELON Cota, MD;  Location: [facility] ENDOSCOPY;  Service: Endoscopy;  Laterality: N/A;   EYE SURGERY Bilateral    cataract sx   TONSILLECTOMY     WISDOM TOOTH EXTRACTION  1983    SOCIAL HISTORY: Social History   Socioeconomic History   Marital status: Widowed    Spouse name: Not on file   Number of children: Not on file   Years of education: Not on file   Highest education level: Not on file  Occupational History   Not on file  Tobacco Use   Smoking status: Never   Smokeless tobacco: Never  Vaping Use   Vaping status: Never Used  Substance and Sexual Activity   Alcohol use: Yes    Comment: occasional wine   Drug use: No   Sexual activity: Yes  Other Topics Concern   Not on file  Social History Narrative   Widower.   Step children.   Librarian, academic.   Enjoys riding hot air  balloons, spending time with friends, reading, jewelry   Social Drivers of Health   Financial Resource Strain: Low Risk  (07/31/2024)   Received from [facility]   Overall Financial Resource Strain (CARDIA)    Difficulty of Paying Living Expenses: Not hard at all  Food Insecurity: No Food Insecurity (07/31/2024)   Received from [facility]   Hunger Vital Sign    Within the past 12 months, you worried that your food would run out before you got the money to buy more.: Never true    Within the past 12 months, the food you bought just didn't last and you didn't have money to get more.: Never true  Transportation Needs: No Transportation Needs (07/31/2024)   Received from [facility] - Transportation    In the past 12 months, has lack of transportation kept you from medical appointments or from getting medications?: No    Lack of Transportation (Non-Medical): No  Physical Activity: Inactive (12/06/2023)   Exercise Vital Sign    Days of Exercise per Week: 0 days    Minutes of Exercise per Session: 0 min  Stress: No Stress Concern Present (12/06/2023)   [employer] of Occupational Health - Occupational Stress Questionnaire    Feeling of Stress : Not at all  Social Connections: Moderately Isolated (12/06/2023)   Social Connection and Isolation Panel    Frequency of Communication with Friends and Family: More than three times a week    Frequency of Social Gatherings with Friends and Family: More than three times a week    Attends Religious Services: Never    Database administrator or Organizations: Yes    Attends Engineer, structural: More than 4 times per year    Marital Status: Widowed  Intimate Partner Violence: Not At Risk (12/06/2023)   Humiliation, Afraid, Rape, and Kick questionnaire    Fear of Current or Ex-Partner: No    Emotionally Abused: No    Physically Abused: No    Sexually Abused: No    FAMILY  HISTORY: Family History  Problem Relation Age of Onset   Stroke Mother    Heart disease Father  Lung cancer Maternal Grandmother    Stomach cancer Paternal Grandmother    Breast cancer Neg Hx     ALLERGIES:  is allergic to ciprofloxacin; trulicity  [dulaglutide ]; and antihistamines, chlorpheniramine-type.  MEDICATIONS:  Current Outpatient Medications  Medication Sig Dispense Refill   b complex vitamins tablet Take 1 tablet by mouth daily.     BIOTIN PO Take 5 mg by mouth daily.     Cholecalciferol 50 MCG (2000 UT) CAPS Take 1 capsule by mouth daily after lunch.     Continuous Glucose Sensor (FREESTYLE LIBRE 3 PLUS SENSOR) MISC Use to check blood sugar continuously. Change sensor every 15 days. 6 each 1   famotidine (PEPCID) 20 MG tablet Take 20 mg by mouth at bedtime.     Fish Oil-Krill Oil (KRILL OIL PLUS PO) Take 1 tablet by mouth daily at 6 (six) AM.     fluticasone (FLONASE) 50 MCG/ACT nasal spray Place into both nostrils daily as needed for allergies or rhinitis.     Insulin  Pen Needle (B-D ULTRAFINE III SHORT PEN) 31G X 8 MM MISC USE WITH INSULIN  NIGHTLY 100 each 1   latanoprost (XALATAN) 0.005 % ophthalmic solution Place 1 drop into both eyes every morning.     levothyroxine  (SYNTHROID ) 100 MCG tablet TAKE 1 TABLET BY MOUTH IN THE MORNING ON  AN  EMPTY  STOMACH  WITH  WATER  ONLY  (NO  FOOD  OR  OTHER  MEDICATIONS  FOR  30  MINUTES) 90 tablet 2   Magnesium 200 MG TABS Take 1 tablet by mouth at bedtime.     rosuvastatin  (CRESTOR ) 10 MG tablet TAKE 1 TABLET BY MOUTH ONCE DAILY FOR CHOLESTEROL 90 tablet 2   tirzepatide  (MOUNJARO ) 7.5 MG/0.5ML Pen Inject 7.5 mg into the skin once a week. for diabetes. 6 mL 1   VITAMIN A PO Take 2,400 mcg by mouth daily at 6 (six) AM.     anastrozole  (ARIMIDEX ) 1 MG tablet Take 1 tablet (1 mg total) by mouth daily. 90 tablet 1   No current facility-administered medications for this visit.      SABRA  PHYSICAL EXAMINATION: ECOG PERFORMANCE STATUS:  0 - Asymptomatic  Vitals:   08/02/24 1449  BP: (!) 153/70  Pulse: 85  Resp: 16  Temp: 98.4 F (36.9 C)  SpO2: 99%    Filed Weights   08/02/24 1449  Weight: 187 lb (84.8 kg)     Physical Exam Vitals and nursing note reviewed.  HENT:     Head: Normocephalic and atraumatic.     Mouth/Throat:     Pharynx: Oropharynx is clear.  Eyes:     Extraocular Movements: Extraocular movements intact.     Pupils: Pupils are equal, round, and reactive to light.  Cardiovascular:     Rate and Rhythm: Normal rate and regular rhythm.  Abdominal:     Palpations: Abdomen is soft.  Musculoskeletal:        General: Normal range of motion.     Cervical back: Normal range of motion.  Skin:    General: Skin is warm.  Neurological:     General: No focal deficit present.     Mental Status: She is alert and oriented to person, place, and time.  Psychiatric:        Behavior: Behavior normal.        Judgment: Judgment normal.      LABORATORY DATA:  I have reviewed the data as listed Lab Results  Component Value Date  WBC 7.5 08/02/2024   HGB 15.3 (H) 08/02/2024   HCT 45.1 08/02/2024   MCV 91.7 08/02/2024   PLT 282 08/02/2024   Recent Labs    01/14/24 1440 01/26/24 1453 08/02/24 1438  NA 136 136 134*  K 3.9 4.1 4.4  CL 102 101 102  CO2 26 24 25   GLUCOSE 101* 112* 131*  BUN 12 13 12   CREATININE 1.54* 0.86 0.90  CALCIUM  9.8 9.9 9.6  GFRNONAA 36*  --  >60  PROT 7.7  --  7.3  ALBUMIN 4.3  --  4.1  AST 24  --  23  ALT 11  --  10  ALKPHOS 87  --  98  BILITOT 0.5  --  0.7    RADIOGRAPHIC STUDIES: I have personally reviewed the radiological images as listed and agreed with the findings in the report. No results found.  ASSESSMENT & PLAN:   Carcinoma of upper-outer quadrant of right breast in female, estrogen receptor positive (HCC) # Right breast cancer stage I [id number]-INVASIVE MAMMARY CARCINOMA WITH DUCTAL AND LOBULAR FEATURES, 14 MM. PN=0; G-2. ?  Focal involvement of the  margin by DCIS; invasive carcinoma negative margins. Oncotype-RS =8; low risk.  S/p radiation. 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   # Patient on Anastrazole [since Nov 1st 2023]-patient tolerating well except for mild hot flashes/fatigue/insomnia. Mammogram/Dr.Cintron- MAY 2025- WNL- stable.   # Fatigue/insomnia-?  Related to anastrozole .  S/p care program- unable to complete because of conflict of schedules. .   # Hot flashes- from anastrozole  G-1 monitor now. Stable.   # Osteopenia: BMD March 2023- T-score of -1.9.  Continue On ca+vit D.  Stable.   # DM/ Obesity- on Monjauro-   importance of healthy weight/and weight loss.Stable.   # DISPOSITION: # follow up in 6 months  MD; labs-cbc/cmp; vit D levels-Dr.B  Dr.Clark- [place],  [place].   All questions were answered. The patient/family knows to call the clinic with any problems, questions or concerns.     Cindy JONELLE Joe, MD 08/02/2024 3:19 PM

## 2024-08-02 NOTE — Addendum Note (Signed)
 Addended by: JOSHUA ALFONSO CROME on: 08/02/2024 03:30 PM   Modules accepted: Orders

## 2024-08-09 ENCOUNTER — Other Ambulatory Visit: Payer: Self-pay | Admitting: Primary Care

## 2024-08-09 DIAGNOSIS — E1165 Type 2 diabetes mellitus with hyperglycemia: Secondary | ICD-10-CM

## 2024-08-09 NOTE — Telephone Encounter (Signed)
 Spoke to pt, sch cpe for 01/31/25

## 2024-08-09 NOTE — Telephone Encounter (Signed)
 Patient is due for CPE/follow up in early February 2026, this will be required prior to any further refills.  Please schedule, thank you!

## 2024-09-01 DIAGNOSIS — Z961 Presence of intraocular lens: Secondary | ICD-10-CM | POA: Diagnosis not present

## 2024-09-01 DIAGNOSIS — H401133 Primary open-angle glaucoma, bilateral, severe stage: Secondary | ICD-10-CM | POA: Diagnosis not present

## 2024-09-01 DIAGNOSIS — E119 Type 2 diabetes mellitus without complications: Secondary | ICD-10-CM | POA: Diagnosis not present

## 2024-09-01 LAB — HM DIABETES EYE EXAM

## 2024-09-04 ENCOUNTER — Other Ambulatory Visit: Payer: Self-pay | Admitting: Medical Genetics

## 2024-09-11 ENCOUNTER — Other Ambulatory Visit

## 2024-11-01 ENCOUNTER — Ambulatory Visit
Admission: RE | Admit: 2024-11-01 | Discharge: 2024-11-01 | Disposition: A | Discharge status: Home / Self Care | Source: Ambulatory Visit | Attending: Primary Care | Admitting: Primary Care

## 2024-11-01 ENCOUNTER — Ambulatory Visit: Payer: Self-pay | Admitting: Primary Care

## 2024-11-01 DIAGNOSIS — Z780 Asymptomatic menopausal state: Secondary | ICD-10-CM | POA: Diagnosis not present

## 2024-11-01 DIAGNOSIS — E2839 Other primary ovarian failure: Secondary | ICD-10-CM | POA: Insufficient documentation

## 2024-11-06 DIAGNOSIS — G548 Other nerve root and plexus disorders: Secondary | ICD-10-CM | POA: Diagnosis not present

## 2024-11-06 DIAGNOSIS — Z86018 Personal history of other benign neoplasm: Secondary | ICD-10-CM | POA: Diagnosis not present

## 2024-11-06 DIAGNOSIS — Z85828 Personal history of other malignant neoplasm of skin: Secondary | ICD-10-CM | POA: Diagnosis not present

## 2024-11-06 DIAGNOSIS — Z872 Personal history of diseases of the skin and subcutaneous tissue: Secondary | ICD-10-CM | POA: Diagnosis not present

## 2024-11-06 DIAGNOSIS — L578 Other skin changes due to chronic exposure to nonionizing radiation: Secondary | ICD-10-CM | POA: Diagnosis not present

## 2024-11-06 DIAGNOSIS — D485 Neoplasm of uncertain behavior of skin: Secondary | ICD-10-CM | POA: Diagnosis not present

## 2024-11-16 DIAGNOSIS — H5712 Ocular pain, left eye: Secondary | ICD-10-CM | POA: Diagnosis not present

## 2024-11-16 DIAGNOSIS — Z961 Presence of intraocular lens: Secondary | ICD-10-CM | POA: Diagnosis not present

## 2024-11-16 DIAGNOSIS — H401133 Primary open-angle glaucoma, bilateral, severe stage: Secondary | ICD-10-CM | POA: Diagnosis not present

## 2024-12-06 ENCOUNTER — Ambulatory Visit: Payer: Medicare HMO

## 2024-12-06 VITALS — Ht 61.0 in | Wt 185.0 lb

## 2024-12-06 DIAGNOSIS — Z0000 Encounter for general adult medical examination without abnormal findings: Secondary | ICD-10-CM | POA: Diagnosis not present

## 2024-12-06 NOTE — Progress Notes (Signed)
 Chief Complaint  Patient presents with   Medicare Wellness     Subjective:   Valerie Gordon is a 73 y.o. female who presents for a Medicare Annual Wellness Visit.  Visit info / Clinical Intake: Medicare Wellness Visit Type:: Subsequent Annual Wellness Visit Persons participating in visit and providing information:: patient Medicare Wellness Visit Mode:: Telephone If telephone:: video declined Since this visit was completed virtually, some vitals may be partially provided or unavailable. Missing vitals are due to the limitations of the virtual format.: Unable to obtain vitals - no equipment If Telephone or Video please confirm:: I connected with patient using audio/video enable telemedicine. I verified patient identity with two identifiers, discussed telehealth limitations, and patient agreed to proceed. Patient Location:: home Provider Location:: home office Interpreter Needed?: No Pre-visit prep was completed: yes AWV questionnaire completed by patient prior to visit?: no Living arrangements:: (!) lives alone Patient's Overall Health Status Rating: good Typical amount of pain: some Does pain affect daily life?: no Are you currently prescribed opioids?: no  Dietary Habits and Nutritional Risks How many meals a day?: 3 Eats fruit and vegetables daily?: yes Most meals are obtained by: preparing own meals; eating out In the last 2 weeks, have you had any of the following?: none Diabetic:: (!) yes Any non-healing wounds?: no How often do you check your BS?: continuous glucose monitor Would you like to be referred to a Nutritionist or for Diabetic Management? : no  Functional Status Activities of Daily Living (to include ambulation/medication): Independent Ambulation: Independent Medication Administration: Independent Home Management (perform basic housework or laundry): Independent Manage your own finances?: yes Primary transportation is: driving Concerns about vision?: no  *vision screening is required for WTM* Concerns about hearing?: no  Fall Screening Falls in the past year?: 0 Number of falls in past year: 0 Was there an injury with Fall?: 0 Fall Risk Category Calculator: 0 Patient Fall Risk Level: Low Fall Risk  Fall Risk Patient at Risk for Falls Due to: No Fall Risks Fall risk Follow up: Education provided; Falls evaluation completed; Falls prevention discussed  Home and Transportation Safety: All rugs have non-skid backing?: yes All stairs or steps have railings?: yes Grab bars in the bathtub or shower?: yes Have non-skid surface in bathtub or shower?: yes Good home lighting?: yes Regular seat belt use?: yes Hospital stays in the last year:: no  Cognitive Assessment Difficulty concentrating, remembering, or making decisions? : no Will 6CIT or Mini Cog be Completed: yes What year is it?: 0 points What month is it?: 0 points Give patient an address phrase to remember (5 components): [address] [place]  About what time is it?: 0 points Count backwards from 20 to 1: 0 points Say the months of the year in reverse: 0 points Repeat the address phrase from earlier: 0 points 6 CIT Score: 0 points  Advance Directives (For Healthcare) Does Patient Have a Medical Advance Directive?: Yes Does patient want to make changes to medical advance directive?: No - Patient declined Type of Advance Directive: Living will; Healthcare Power of Attorney Copy of Healthcare Power of Attorney in Chart?: No - copy requested Copy of Living Will in Chart?: No - copy requested  Reviewed/Updated  Reviewed/Updated: Reviewed All (Medical, Surgical, Family, Medications, Allergies, Care Teams, Patient Goals)    Allergies (verified) Ciprofloxacin; Trulicity  [dulaglutide ]; and Antihistamines, chlorpheniramine-type   Current Medications (verified) Outpatient Encounter Medications as of 12/06/2024  Medication Sig   anastrozole  (ARIMIDEX ) 1 MG tablet Take  1 tablet (1 mg total) by mouth daily.   b complex vitamins tablet Take 1 tablet by mouth daily.   BIOTIN PO Take 5 mg by mouth daily.   Cholecalciferol 50 MCG (2000 UT) CAPS Take 1 capsule by mouth daily after lunch.   Continuous Glucose Sensor (FREESTYLE LIBRE 3 PLUS SENSOR) MISC Use to check blood sugar continuously. Change sensor every 15 days.   famotidine (PEPCID) 20 MG tablet Take 20 mg by mouth at bedtime.   Fish Oil-Krill Oil (KRILL OIL PLUS PO) Take 1 tablet by mouth daily at 6 (six) AM.   fluticasone (FLONASE) 50 MCG/ACT nasal spray Place into both nostrils daily as needed for allergies or rhinitis.   Insulin  Pen Needle (B-D ULTRAFINE III SHORT PEN) 31G X 8 MM MISC USE WITH INSULIN  NIGHTLY   latanoprost (XALATAN) 0.005 % ophthalmic solution Place 1 drop into both eyes every morning.   levothyroxine  (SYNTHROID ) 100 MCG tablet TAKE 1 TABLET BY MOUTH IN THE MORNING ON  AN  EMPTY  STOMACH  WITH  WATER  ONLY  (NO  FOOD  OR  OTHER  MEDICATIONS  FOR  30  MINUTES)   Magnesium 200 MG TABS Take 1 tablet by mouth at bedtime.   rosuvastatin  (CRESTOR ) 10 MG tablet TAKE 1 TABLET BY MOUTH ONCE DAILY FOR CHOLESTEROL   tirzepatide  (MOUNJARO ) 7.5 MG/0.5ML Pen INJECT 7.5MG  SUBCUTANEOUSLY  ONCE A WEEK FOR DIABETES   VITAMIN A PO Take 2,400 mcg by mouth daily at 6 (six) AM.   No facility-administered encounter medications on file as of 12/06/2024.    History: Past Medical History:  Diagnosis Date   Acute diarrhea 05/14/2021   Allergy per my fle   Arthritis    lumbar spine   Breast cancer (HCC)    Cataract    COVID-19 virus infection 10/14/2021   Depression    Gallstones    asyptomatic   GERD (gastroesophageal reflux disease)    Glaucoma    Headache    h/o migraines   Hyperlipidemia    Hypothyroidism    Obesity    Thyroid  disease    Type 2 diabetes mellitus (HCC)    manages with diet and exercise   Past Surgical History:  Procedure Laterality Date   AXILLARY SENTINEL NODE BIOPSY  Right 06/17/2022   Procedure: AXILLARY SENTINEL NODE BIOPSY;  Gordon: Dessa Reyes ORN, MD;  Location: [facility] ORS;  Service: General;  Laterality: Right;   BREAST BIOPSY  12/28/2008   BREAST BIOPSY Right 04/28/2022   US  core bx 11:00 coil clip -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   BREAST LUMPECTOMY WITH NEEDLE LOCALIZATION Bilateral 06/17/2022   Procedure: BREAST LUMPECTOMY WITH NEEDLE LOCALIZATION;  Gordon: Dessa Reyes ORN, MD;  Location: [facility] ORS;  Service: General;  Laterality: Bilateral;   CARDIAC CATHETERIZATION  12/28/1996   and a glaucoma procedure as well that has a metal shunt in left eye   COLONOSCOPY  10/30/2004   COLONOSCOPY WITH PROPOFOL  N/A 04/01/2016   Procedure: COLONOSCOPY WITH PROPOFOL ;  Gordon: Reyes ORN Dessa, MD;  Location: [facility] ENDOSCOPY;  Service: Endoscopy;  Laterality: N/A;   EYE SURGERY Bilateral    cataract sx   TONSILLECTOMY     WISDOM TOOTH EXTRACTION  1983   Family History  Problem Relation Age of Onset   Stroke Mother    Heart disease Father  Diabetes Father    Lung cancer Maternal Grandmother    Cancer Maternal Grandmother    Stomach cancer Paternal Grandmother    Cancer Paternal Grandmother    Breast cancer Neg Hx    Social History   Occupational History   Not on file  Tobacco Use   Smoking status: Never   Smokeless tobacco: Never  Vaping Use   Vaping status: Never Used  Substance and Sexual Activity   Alcohol use: Yes    Comment: occasional wine   Drug use: No   Sexual activity: Yes   Tobacco Counseling Counseling given: Not Answered  SDOH Screenings   Food Insecurity: No Food Insecurity (12/06/2024)  Housing: Unknown (12/06/2024)  Transportation Needs: No Transportation Needs (12/06/2024)  Utilities: Not At Risk (12/06/2024)  Alcohol Screen: Low Risk  (12/06/2023)  Depression  (PHQ2-9): Low Risk  (12/06/2024)  Financial Resource Strain: Low Risk  (07/31/2024)   [facility]  Physical Activity: Inactive (12/06/2024)  Social Connections: Moderately Isolated (12/06/2024)  Stress: No Stress Concern Present (12/06/2024)  Tobacco Use: Low Risk  (12/06/2024)  Health Literacy: Adequate Health Literacy (12/06/2024)   See flowsheets for full screening details  Depression Screen PHQ 2 & 9 Depression Scale- Over the past 2 weeks, how often have you been bothered by any of the following problems? Little interest or pleasure in doing things: 0 Feeling down, depressed, or hopeless (PHQ Adolescent also includes...irritable): 0 PHQ-2 Total Score: 0 Trouble falling or staying asleep, or sleeping too much: 0 Feeling tired or having little energy: 0 Poor appetite or overeating (PHQ Adolescent also includes...weight loss): 0 Feeling bad about yourself - or that you are a failure or have let yourself or your family down: 0 Trouble concentrating on things, such as reading the newspaper or watching television (PHQ Adolescent also includes...like school work): 0 Moving or speaking so slowly that other people could have noticed. Or the opposite - being so fidgety or restless that you have been moving around a lot more than usual: 0 Thoughts that you would be better off dead, or of hurting yourself in some way: 0 PHQ-9 Total Score: 0 If you checked off any problems, how difficult have these problems made it for you to do your work, take care of things at home, or get along with other people?: Not difficult at all     Goals Addressed             This Visit's Progress    Keep A1c current at levels now, exercise a little more and heqalthy food choices       COMPLETED: Patient Stated       08/23/2020, I will continue to work with my dietician to help me lose 30-40 lbs total.      COMPLETED: Patient Stated       Would like to lose weight and increase  energy.     Patient Stated   On track    Lose 20 lbs.             Objective:    Today's Vitals   12/06/24 1052  Weight: 185 lb (83.9 kg)  Height: 5' 1 (1.549 m)   Body mass index is 34.96 kg/m.  Hearing/Vision screen Vision Screening - Comments:: UTD w/visits to [employer] and Health Maintenance Health Maintenance  Topic Date Due   Influenza Vaccine  07/28/2024   COVID-19 Vaccine (7 - 2025-26 season) 08/28/2024   Medicare Annual Wellness (AWV)  12/05/2024  HEMOGLOBIN A1C  01/26/2025   Mammogram  05/29/2025   Diabetic kidney evaluation - Urine ACR  07/26/2025   FOOT EXAM  07/26/2025   Diabetic kidney evaluation - eGFR measurement  08/02/2025   OPHTHALMOLOGY EXAM  09/01/2025   Colonoscopy  04/01/2026   Bone Density Scan  11/01/2026   DTaP/Tdap/Td (4 - Td or Tdap) 03/02/2034   Pneumococcal Vaccine: 50+ Years  Completed   Hepatitis C Screening  Completed   Zoster Vaccines- Shingrix  Completed   Meningococcal B Vaccine  Aged Out        Assessment/Plan:  This is a routine wellness examination for Valerie Gordon.  Patient Care Team: Gretta Comer POUR, NP as PCP - General (Internal Medicine) Fleeta Milks, Lamar ORN, MD as Consulting Physician (Obstetrics and Gynecology) Dessa, Reyes ORN, MD (General Surgery) Wonda Cy BROCKS, RD as Dietitian (Family Medicine) Dannielle Arlean FALCON, RN (Inactive) as Oncology Nurse Navigator Georgina Shasta POUR, RN as Oncology Nurse Navigator Rennie Cindy SAUNDERS, MD as Consulting Physician (Oncology) Lenn Aran, MD as Consulting Physician (Radiation Oncology) [facility], [facility])  I have personally reviewed and noted the following in the patients chart:   Medical and social history Use of alcohol, tobacco or illicit drugs  Current medications and supplements including opioid prescriptions. Functional ability and status Nutritional status Physical activity Advanced directives List of other  physicians Hospitalizations, surgeries, and ER visits in previous 12 months Vitals Screenings to include cognitive, depression, and falls Referrals and appointments  No orders of the defined types were placed in this encounter.  In addition, I have reviewed and discussed with patient certain preventive protocols, quality metrics, and best practice recommendations. A written personalized care plan for preventive services as well as general preventive health recommendations were provided to patient.   Erminio LITTIE Saris, LPN   [id number]    After Visit Summary: (MyChart) Due to this being a telephonic visit, the after visit summary with patients personalized plan was offered to patient via MyChart   Nurse Notes: No voiced or noted concerns at this time Patient advised to keep follow-up appointment with PCP [phone] CPE/annual) Screenings not ordered: Already had all screening - Records requested - Awaiting results HM Addressed: will get Influenza and Covid from her pharmacy soon   Results of screenings in chart Pt desires to align AWV and CPE in Feb 2027 (will schedule together at Feb PCE appt)

## 2024-12-06 NOTE — Patient Instructions (Signed)
 Valerie Gordon,  Thank you for taking the time for your Medicare Wellness Visit. I appreciate your continued commitment to your health goals. Please review the care plan we discussed, and feel free to reach out if I can assist you further.  Please note that Annual Wellness Visits do not include a physical exam. Some assessments may be limited, especially if the visit was conducted virtually. If needed, we may recommend an in-person follow-up with your provider.  Ongoing Care Seeing your primary care provider every 3 to 6 months helps us  monitor your health and provide consistent, personalized care.   Referrals If a referral was made during today's visit and you haven't received any updates within two weeks, please contact the referred provider directly to check on the status.  Recommended Screenings:  Health Maintenance  Topic Date Due   Flu Shot  07/28/2024   COVID-19 Vaccine (7 - 2025-26 season) 08/28/2024   Medicare Annual Wellness Visit  12/05/2024   Hemoglobin A1C  01/26/2025   Breast Cancer Screening  05/29/2025   Yearly kidney health urinalysis for diabetes  07/26/2025   Complete foot exam   07/26/2025   Yearly kidney function blood test for diabetes  08/02/2025   Eye exam for diabetics  09/01/2025   Colon Cancer Screening  04/01/2026   Osteoporosis screening with Bone Density Scan  11/01/2026   DTaP/Tdap/Td vaccine (4 - Td or Tdap) 03/02/2034   Pneumococcal Vaccine for age over 81  Completed   Hepatitis C Screening  Completed   Zoster (Shingles) Vaccine  Completed   Meningitis B Vaccine  Aged Out       12/06/2024   10:55 AM  Advanced Directives  Does Patient Have a Medical Advance Directive? Yes  Type of Advance Directive Living will;Healthcare Power of Attorney  Copy of Healthcare Power of Attorney in Chart? No - copy requested    Vision: Annual vision screenings are recommended for early detection of glaucoma, cataracts, and diabetic retinopathy. These exams can also  reveal signs of chronic conditions such as diabetes and high blood pressure.  Dental: Annual dental screenings help detect early signs of oral cancer, gum disease, and other conditions linked to overall health, including heart disease and diabetes.

## 2024-12-17 ENCOUNTER — Other Ambulatory Visit: Payer: Self-pay | Admitting: Primary Care

## 2024-12-17 DIAGNOSIS — E785 Hyperlipidemia, unspecified: Secondary | ICD-10-CM

## 2025-01-12 ENCOUNTER — Other Ambulatory Visit: Payer: Self-pay | Admitting: Primary Care

## 2025-01-12 DIAGNOSIS — E1165 Type 2 diabetes mellitus with hyperglycemia: Secondary | ICD-10-CM

## 2025-01-18 ENCOUNTER — Other Ambulatory Visit: Payer: Self-pay | Admitting: Internal Medicine

## 2025-01-18 DIAGNOSIS — C50411 Malignant neoplasm of upper-outer quadrant of right female breast: Secondary | ICD-10-CM

## 2025-01-29 ENCOUNTER — Inpatient Hospital Stay: Admitting: Internal Medicine

## 2025-01-29 ENCOUNTER — Inpatient Hospital Stay

## 2025-01-31 ENCOUNTER — Encounter: Admitting: Primary Care

## 2025-02-01 ENCOUNTER — Telehealth: Payer: Self-pay | Admitting: Internal Medicine

## 2025-02-01 NOTE — Telephone Encounter (Signed)
 Patient is asking if she can have her blood tested to see what her blood type is. If so, please add to labs on 2/16. Thank you

## 2025-02-12 ENCOUNTER — Inpatient Hospital Stay: Admitting: Internal Medicine

## 2025-02-12 ENCOUNTER — Inpatient Hospital Stay

## 2025-02-14 ENCOUNTER — Encounter: Admitting: Primary Care
